# Patient Record
Sex: Male | Born: 1950 | Race: White | Hispanic: No | Marital: Married | Smoking: Never smoker
Health system: Southern US, Community
[De-identification: ages and names within clinical notes are randomized; demographics above are authoritative.]

## PROBLEM LIST (undated history)

## (undated) DIAGNOSIS — M109 Gout, unspecified: Secondary | ICD-10-CM

## (undated) DIAGNOSIS — I509 Heart failure, unspecified: Secondary | ICD-10-CM

## (undated) DIAGNOSIS — I10 Essential (primary) hypertension: Secondary | ICD-10-CM

## (undated) DIAGNOSIS — I739 Peripheral vascular disease, unspecified: Secondary | ICD-10-CM

## (undated) DIAGNOSIS — I4891 Unspecified atrial fibrillation: Secondary | ICD-10-CM

## (undated) DIAGNOSIS — I359 Nonrheumatic aortic valve disorder, unspecified: Secondary | ICD-10-CM

## (undated) DIAGNOSIS — D689 Coagulation defect, unspecified: Secondary | ICD-10-CM

## (undated) HISTORY — PX: AORTIC VALVE REPLACEMENT: SHX41

## (undated) HISTORY — DX: Heart failure, unspecified: I50.9

## (undated) HISTORY — PX: PACEMAKER INSERTION: SHX728

## (undated) HISTORY — DX: Coagulation defect, unspecified: D68.9

---

## 2001-01-25 ENCOUNTER — Emergency Department (HOSPITAL_COMMUNITY): Admission: EM | Admit: 2001-01-25 | Discharge: 2001-01-25 | Payer: Self-pay | Admitting: Emergency Medicine

## 2003-03-18 ENCOUNTER — Inpatient Hospital Stay (HOSPITAL_COMMUNITY): Admission: EM | Admit: 2003-03-18 | Discharge: 2003-03-20 | Payer: Self-pay | Admitting: Emergency Medicine

## 2003-03-18 ENCOUNTER — Encounter: Payer: Self-pay | Admitting: Emergency Medicine

## 2003-03-30 ENCOUNTER — Ambulatory Visit (HOSPITAL_COMMUNITY): Admission: RE | Admit: 2003-03-30 | Discharge: 2003-04-02 | Payer: Self-pay | Admitting: Cardiology

## 2003-04-01 ENCOUNTER — Encounter: Payer: Self-pay | Admitting: Internal Medicine

## 2004-09-21 ENCOUNTER — Ambulatory Visit: Payer: Self-pay | Admitting: *Deleted

## 2004-10-05 ENCOUNTER — Ambulatory Visit: Payer: Self-pay | Admitting: Internal Medicine

## 2004-10-16 ENCOUNTER — Ambulatory Visit: Payer: Self-pay

## 2004-11-07 ENCOUNTER — Ambulatory Visit: Payer: Self-pay | Admitting: Internal Medicine

## 2004-11-28 ENCOUNTER — Ambulatory Visit: Payer: Self-pay | Admitting: Internal Medicine

## 2004-11-29 ENCOUNTER — Inpatient Hospital Stay (HOSPITAL_COMMUNITY): Admission: RE | Admit: 2004-11-29 | Discharge: 2004-11-30 | Payer: Self-pay | Admitting: Internal Medicine

## 2004-12-01 ENCOUNTER — Ambulatory Visit: Payer: Self-pay | Admitting: Internal Medicine

## 2004-12-01 ENCOUNTER — Inpatient Hospital Stay (HOSPITAL_COMMUNITY): Admission: EM | Admit: 2004-12-01 | Discharge: 2004-12-06 | Payer: Self-pay | Admitting: Emergency Medicine

## 2004-12-11 ENCOUNTER — Ambulatory Visit: Payer: Self-pay | Admitting: *Deleted

## 2004-12-14 ENCOUNTER — Ambulatory Visit: Payer: Self-pay

## 2004-12-18 ENCOUNTER — Ambulatory Visit: Payer: Self-pay | Admitting: *Deleted

## 2005-01-09 ENCOUNTER — Ambulatory Visit: Payer: Self-pay | Admitting: *Deleted

## 2005-02-26 ENCOUNTER — Ambulatory Visit: Payer: Self-pay | Admitting: Internal Medicine

## 2005-02-28 ENCOUNTER — Ambulatory Visit: Payer: Self-pay | Admitting: Internal Medicine

## 2005-03-02 ENCOUNTER — Ambulatory Visit: Payer: Self-pay | Admitting: Cardiology

## 2005-06-25 ENCOUNTER — Ambulatory Visit: Payer: Self-pay | Admitting: Internal Medicine

## 2005-06-25 ENCOUNTER — Ambulatory Visit: Payer: Self-pay

## 2005-07-13 ENCOUNTER — Ambulatory Visit (HOSPITAL_COMMUNITY): Admission: RE | Admit: 2005-07-13 | Discharge: 2005-07-13 | Payer: Self-pay | Admitting: Internal Medicine

## 2008-04-09 IMAGING — CT CT CHEST W/ CM
1 of 2 series · 16 of 33 positions shown, 20 images · IV contrast (75ML OMNI 300)
Comparison: Chest x-ray 10/19/05.

CLINICAL DATA: Dyspnea and hemoptysis.  
 CT CHEST WITH CONTRAST:
TECHNIQUE: Multidetector CT imaging of the chest was performed following the standard protocol during bolus administration of intravenous contrast. 
 Contrast:  75cc Omnipaque 300.

[Series 4: recon 3: chest w/ · axial · 0.62mm/px · z∈[-374,-88]mm · 16 of 253 slices shown, 20 images]
[im 13/253  mediastinal]
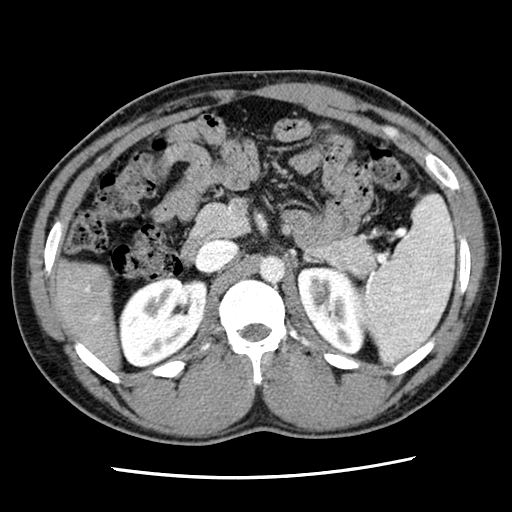
[im 13/253  lung]
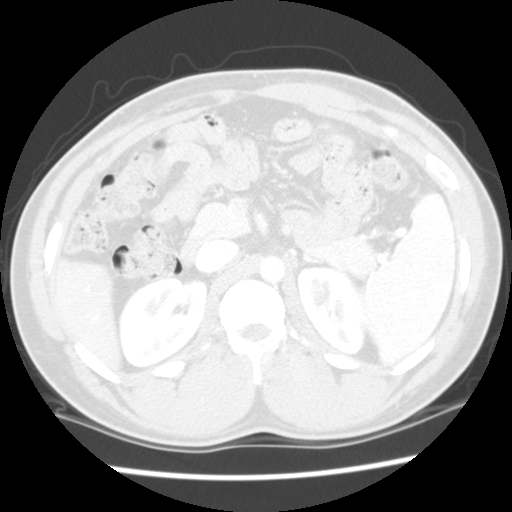
[im 37/253  lung]
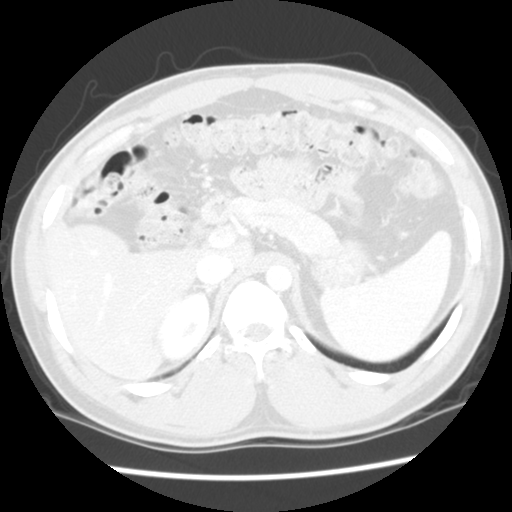
[im 49/253  lung]
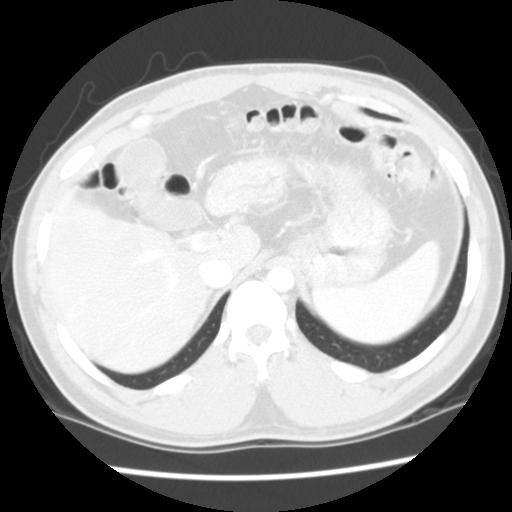
[im 64/253  lung]
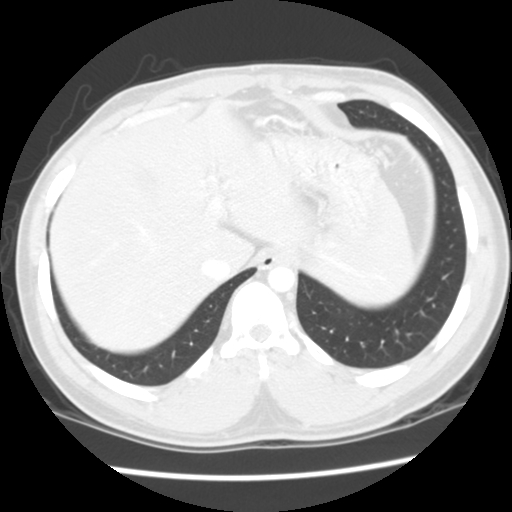
[im 73/253  mediastinal]
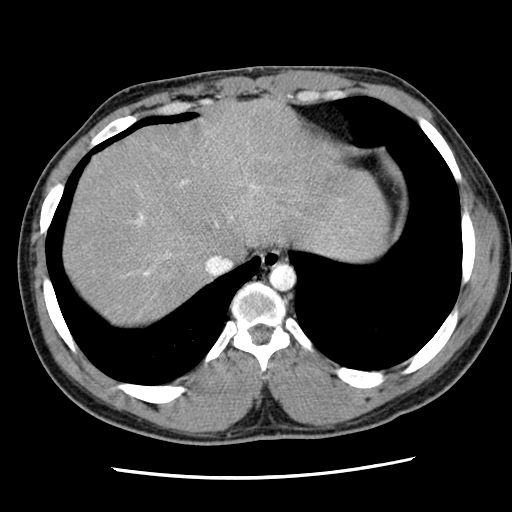
[im 73/253  lung]
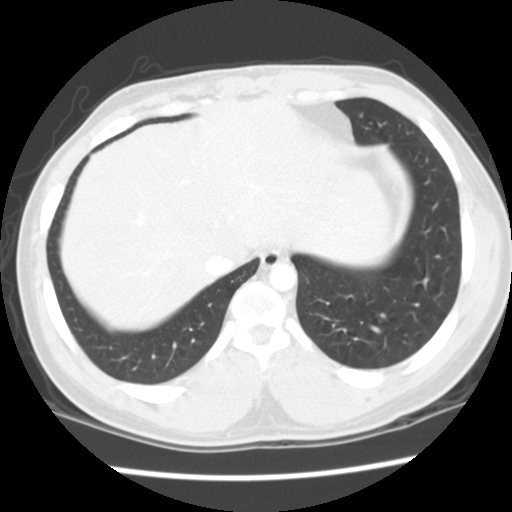
[im 97/253  lung]
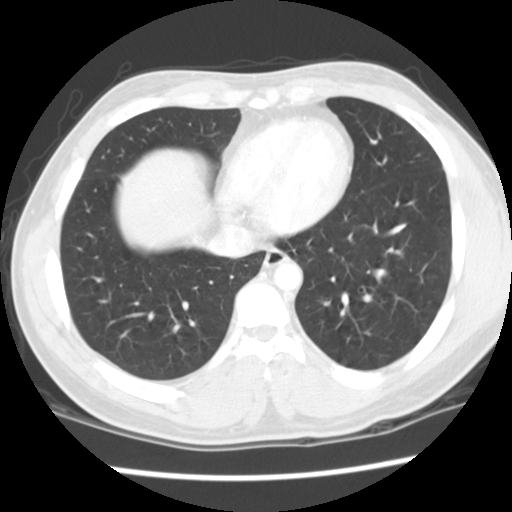
[im 109/253  lung]
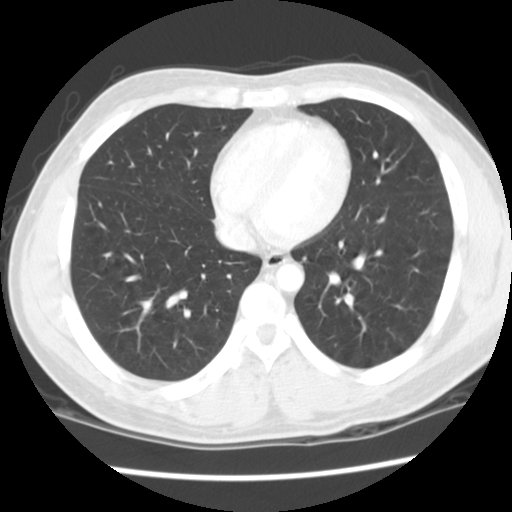
[im 121/253  lung]
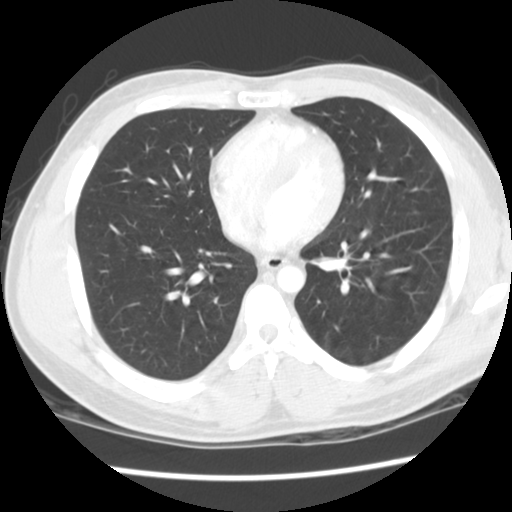
[im 127/253  mediastinal]
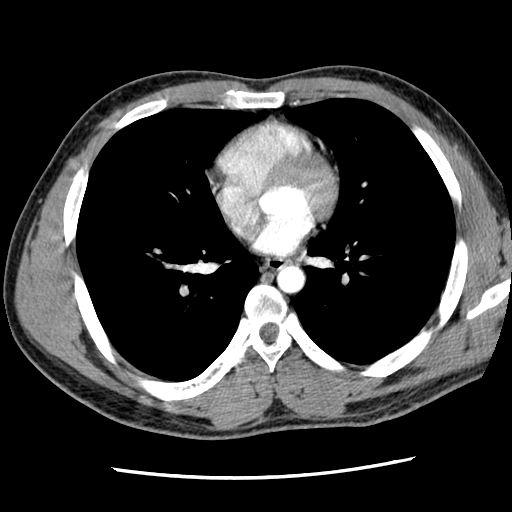
[im 127/253  lung]
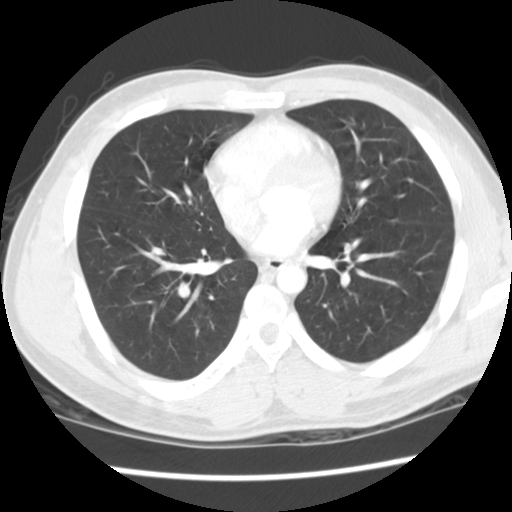
[im 145/253  lung]
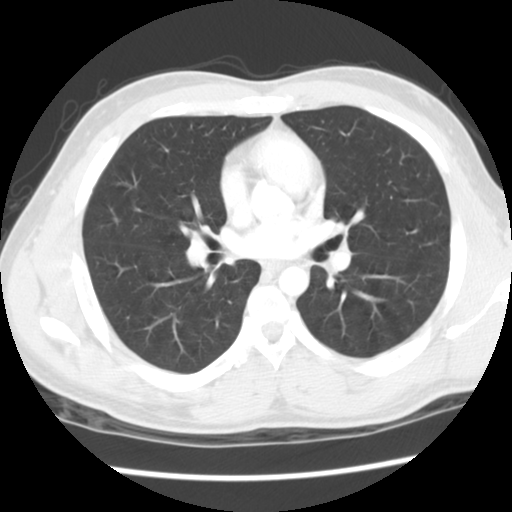
[im 157/253  lung]
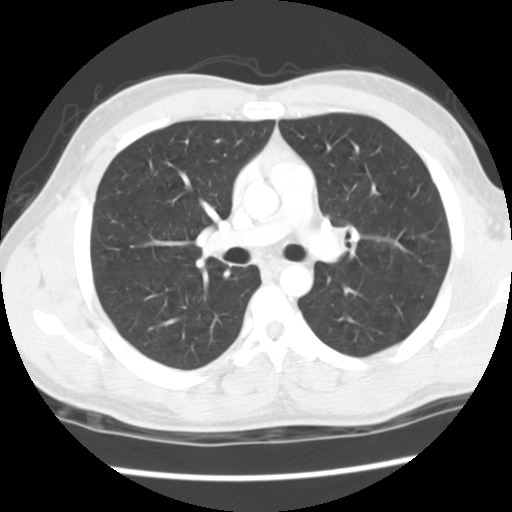
[im 181/253  lung]
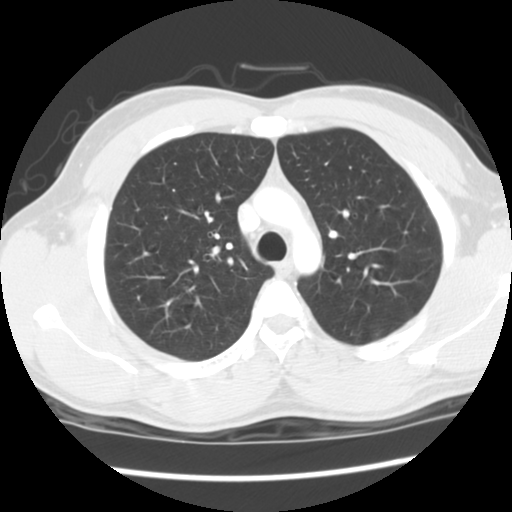
[im 190/253  mediastinal]
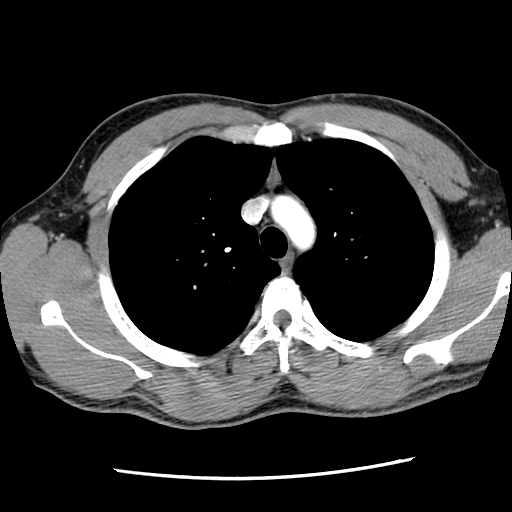
[im 190/253  lung]
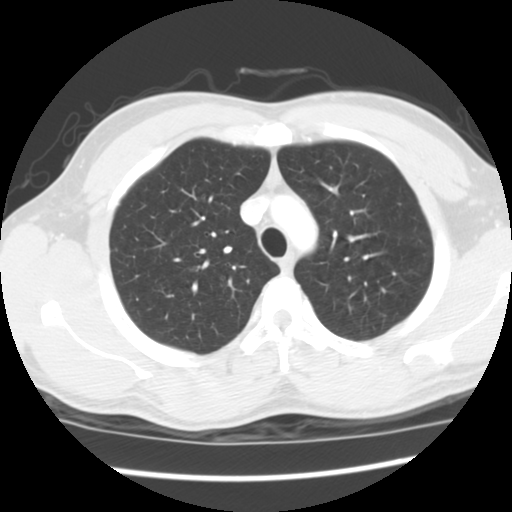
[im 205/253  lung]
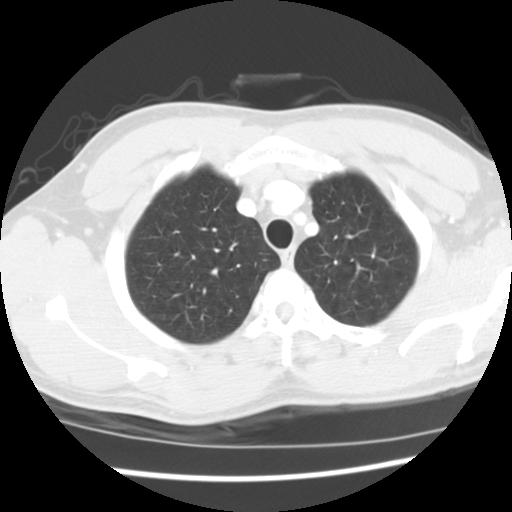
[im 217/253  lung]
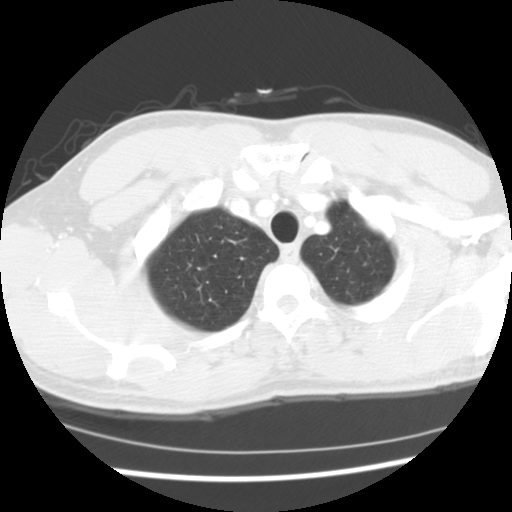
[im 241/253  lung]
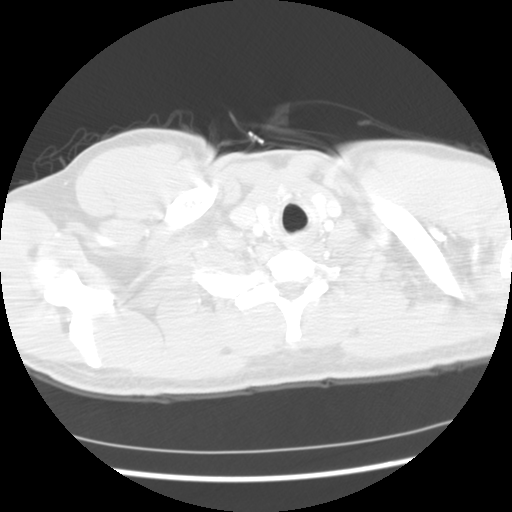

[16 of 33 positions shown; findings below may reference images not displayed]

FINDINGS: No pathologically enlarged mediastinal, hilar, or axillary lymph nodes.  Heart size normal.  No pericardial fluid.  Residual thymus is seen in the anterior mediastinum. 
 Scattered tiny pulmonary nodules are likely benign in a patient of this age.  No pleural fluid.  Airway is unremarkable.  Incidental imaging of the upper abdomen shows high attenuation material in the right kidney, likely representing early excretion of contrast.
IMPRESSION: 1.  No acute findings.  Specifically, no findings to explain the patient?s shortness of breath and hemoptysis.

## 2010-07-22 ENCOUNTER — Encounter: Payer: Self-pay | Admitting: Family Medicine

## 2010-09-14 NOTE — Letter (Signed)
Summary: Meds & Instructions/[facility]  Meds & Instructions/[facility]   Imported By: Lanelle Bal 08/04/2010 11:06:43  _____________________________________________________________________  External Attachment:    Type:   Image     Comment:   External Document

## 2011-04-27 ENCOUNTER — Telehealth: Payer: Self-pay | Admitting: *Deleted

## 2011-04-27 DIAGNOSIS — T82198A Other mechanical complication of other cardiac electronic device, initial encounter: Secondary | ICD-10-CM

## 2011-04-27 NOTE — Telephone Encounter (Signed)
Checking leads  

## 2012-08-05 ENCOUNTER — Emergency Department (HOSPITAL_COMMUNITY)
Admission: EM | Admit: 2012-08-05 | Discharge: 2012-08-05 | Disposition: A | Discharge status: Home / Self Care | Payer: BC Managed Care – PPO | Attending: Emergency Medicine | Admitting: Emergency Medicine

## 2012-08-05 ENCOUNTER — Encounter (HOSPITAL_COMMUNITY): Payer: Self-pay | Admitting: Adult Health

## 2012-08-05 ENCOUNTER — Emergency Department (HOSPITAL_COMMUNITY): Payer: BC Managed Care – PPO

## 2012-08-05 DIAGNOSIS — Z7901 Long term (current) use of anticoagulants: Secondary | ICD-10-CM | POA: Insufficient documentation

## 2012-08-05 DIAGNOSIS — Z4502 Encounter for adjustment and management of automatic implantable cardiac defibrillator: Secondary | ICD-10-CM

## 2012-08-05 DIAGNOSIS — Z711 Person with feared health complaint in whom no diagnosis is made: Secondary | ICD-10-CM | POA: Insufficient documentation

## 2012-08-05 DIAGNOSIS — M109 Gout, unspecified: Secondary | ICD-10-CM | POA: Insufficient documentation

## 2012-08-05 DIAGNOSIS — Z79899 Other long term (current) drug therapy: Secondary | ICD-10-CM | POA: Insufficient documentation

## 2012-08-05 DIAGNOSIS — Z952 Presence of prosthetic heart valve: Secondary | ICD-10-CM | POA: Insufficient documentation

## 2012-08-05 DIAGNOSIS — I499 Cardiac arrhythmia, unspecified: Secondary | ICD-10-CM | POA: Insufficient documentation

## 2012-08-05 DIAGNOSIS — Z95810 Presence of automatic (implantable) cardiac defibrillator: Secondary | ICD-10-CM | POA: Insufficient documentation

## 2012-08-05 DIAGNOSIS — Z8679 Personal history of other diseases of the circulatory system: Secondary | ICD-10-CM | POA: Insufficient documentation

## 2012-08-05 HISTORY — DX: Unspecified atrial fibrillation: I48.91

## 2012-08-05 HISTORY — DX: Nonrheumatic aortic valve disorder, unspecified: I35.9

## 2012-08-05 HISTORY — DX: Gout, unspecified: M10.9

## 2012-08-05 LAB — BASIC METABOLIC PANEL
CO2: 26 mEq/L (ref 19–32)
Calcium: 9.4 mg/dL (ref 8.4–10.5)
GFR calc non Af Amer: >90 mL/min (ref >90)

## 2012-08-05 LAB — POCT I-STAT TROPONIN I: Troponin i, poc: 0.01 ng/mL (ref 0.00–0.08)

## 2012-08-05 LAB — CBC
HCT: 38.4 % — ABNORMAL LOW (ref 39.0–52.0)
Hemoglobin: 13.5 g/dL (ref 13.0–17.0)
MCH: 32.1 pg (ref 26.0–34.0)
MCHC: 35.2 g/dL (ref 30.0–36.0)
MCV: 91.4 fL (ref 78.0–100.0)
RDW: 12.2 % (ref 11.5–15.5)
WBC: 9.8 10*3/uL (ref 4.0–10.5)

## 2012-08-05 MED ORDER — METOPROLOL TARTRATE 25 MG PO TABS: 75.0000 mg | ORAL_TABLET | Freq: Two times a day (BID) | ORAL | Status: DC

## 2012-08-05 MED ORDER — METOPROLOL TARTRATE 25 MG PO TABS
75.0000 mg | ORAL_TABLET | Freq: Once | ORAL | Status: AC
  Administered 2012-08-05: 75 mg via ORAL
  Filled 2012-08-05: qty 3

## 2012-08-05 NOTE — ED Notes (Signed)
While eating steak dinner tonight defibrillator fired twice. New defribillatr just placed on 12/13. [employer] Jude defibrillator. Hx of Afib. Became lightheaded before defibrilator fired. He is alert and oriented, MAE x4. Denies pain, denies SOB. Pre existing LBB, pacemaker/defibrillator dual chamber pacemaker, demand pacemaker at 60%.

## 2012-08-05 NOTE — ED Provider Notes (Signed)
History     CSN: [id number]  Arrival date & time 08/05/12  [place]   First MD Initiated Contact with Patient 08/05/12 2007      Chief Complaint  Patient presents with  . Pacemaker Problem    (Consider location/radiation/quality/duration/timing/severity/associated sxs/prior treatment) HPI Comments: 61 year old male with history of recent pacemaker change at [facility].  This was one week ago.  Tonight he went into the restaurant to have dinner.  He felt light-headed and dizzy and became near-syncopal.  He was then shocked and instantly felt better.  He denies any other symptoms.  No aggravating or alleviating factors.    The history is provided by the patient.    Past Medical History  Diagnosis Date  . Atrial fibrillation   . Aortic valve defect   . Gout     Past Surgical History  Procedure Date  . Aortic valve replacement   . Pacemaker insertion     History reviewed. No pertinent family history.  History  Substance Use Topics  . Smoking status: Not on file  . Smokeless tobacco: Not on file  . Alcohol Use:       Review of Systems  All other systems reviewed and are negative.    Allergies  Ivp dye  Home Medications   Current Outpatient Rx  Name  Route  Sig  Dispense  Refill  . METOPROLOL TARTRATE 25 MG PO TABS   Oral   Take 50 mg by mouth 2 (two) times daily.         Marland Kitchen PRESCRIPTION MEDICATION   Oral   Take 1 tablet by mouth daily. Allopurinol         . RAMIPRIL 2.5 MG PO CAPS   Oral   Take 2.5 mg by mouth daily.         . WARFARIN SODIUM 7.5 MG PO TABS   Oral   Take 7.5 mg by mouth daily.           BP 143/73  Pulse 75  Temp 99 F (37.2 C) (Oral)  Resp 16  SpO2 97%  Physical Exam  Nursing note reviewed. Constitutional: He is oriented to person, place, and time. He appears well-developed and well-nourished. No distress.  HENT:  Head: Normocephalic and atraumatic.  Mouth/Throat: Oropharynx is clear and moist.  Eyes: EOM are normal.  Pupils are equal, round, and reactive to light.  Neck: Normal range of motion. Neck supple.  Cardiovascular: Normal rate and regular rhythm.   No murmur heard. Pulmonary/Chest: Effort normal and breath sounds normal. No respiratory distress.  Abdominal: Soft. Bowel sounds are normal.  Musculoskeletal: Normal range of motion. He exhibits no edema.  Neurological: He is alert and oriented to person, place, and time. No cranial nerve deficit. Coordination normal.  Skin: Skin is warm and dry. He is not diaphoretic.    ED Course  Procedures (including critical care time)  Labs Reviewed  CBC - Abnormal; Notable for the following:    RBC 4.20 (*)     HCT 38.4 (*)     All other components within normal limits  BASIC METABOLIC PANEL - Abnormal; Notable for the following:    Glucose, Bld 115 (*)     All other components within normal limits  POCT I-STAT TROPONIN I   No results found.   No diagnosis found.   Date: 08/05/2012  Rate: 72  Rhythm: normal sinus rhythm  QRS Axis: normal  Intervals: normal  ST/T Wave abnormalities: nonspecific T wave  changes  Conduction Disutrbances:left bundle branch block  Narrative Interpretation:   Old EKG Reviewed: none available    MDM  The patient presents after being shocked by his defibrillator.  He feels fine now and labs look okay.  The device was interrogated and revealed that this was an appropriate shock.  I have contacted the cardiologist at [facility] where he had his device placed.  I spoke with Dr. Misty Stanley who did not feel as thought the patient required transfer.  His recommendation was to increase the dose of lopressor to 75 mg twice daily and follow up with Dr. Orson Aloe on the 26th.  I discussed this with the patient who was agreeable to this plan.          Geoffery Lyons, MD 08/06/12 Marlyne Beards

## 2013-05-27 IMAGING — CR DG CHEST 1V PORT
1 series · 1 of 1 positions shown · non-contrast
Comparison: 11/30/2004.

CLINICAL DATA: Pacemaker problem.  Syncope.

PORTABLE CHEST - 1 VIEW

[AP]
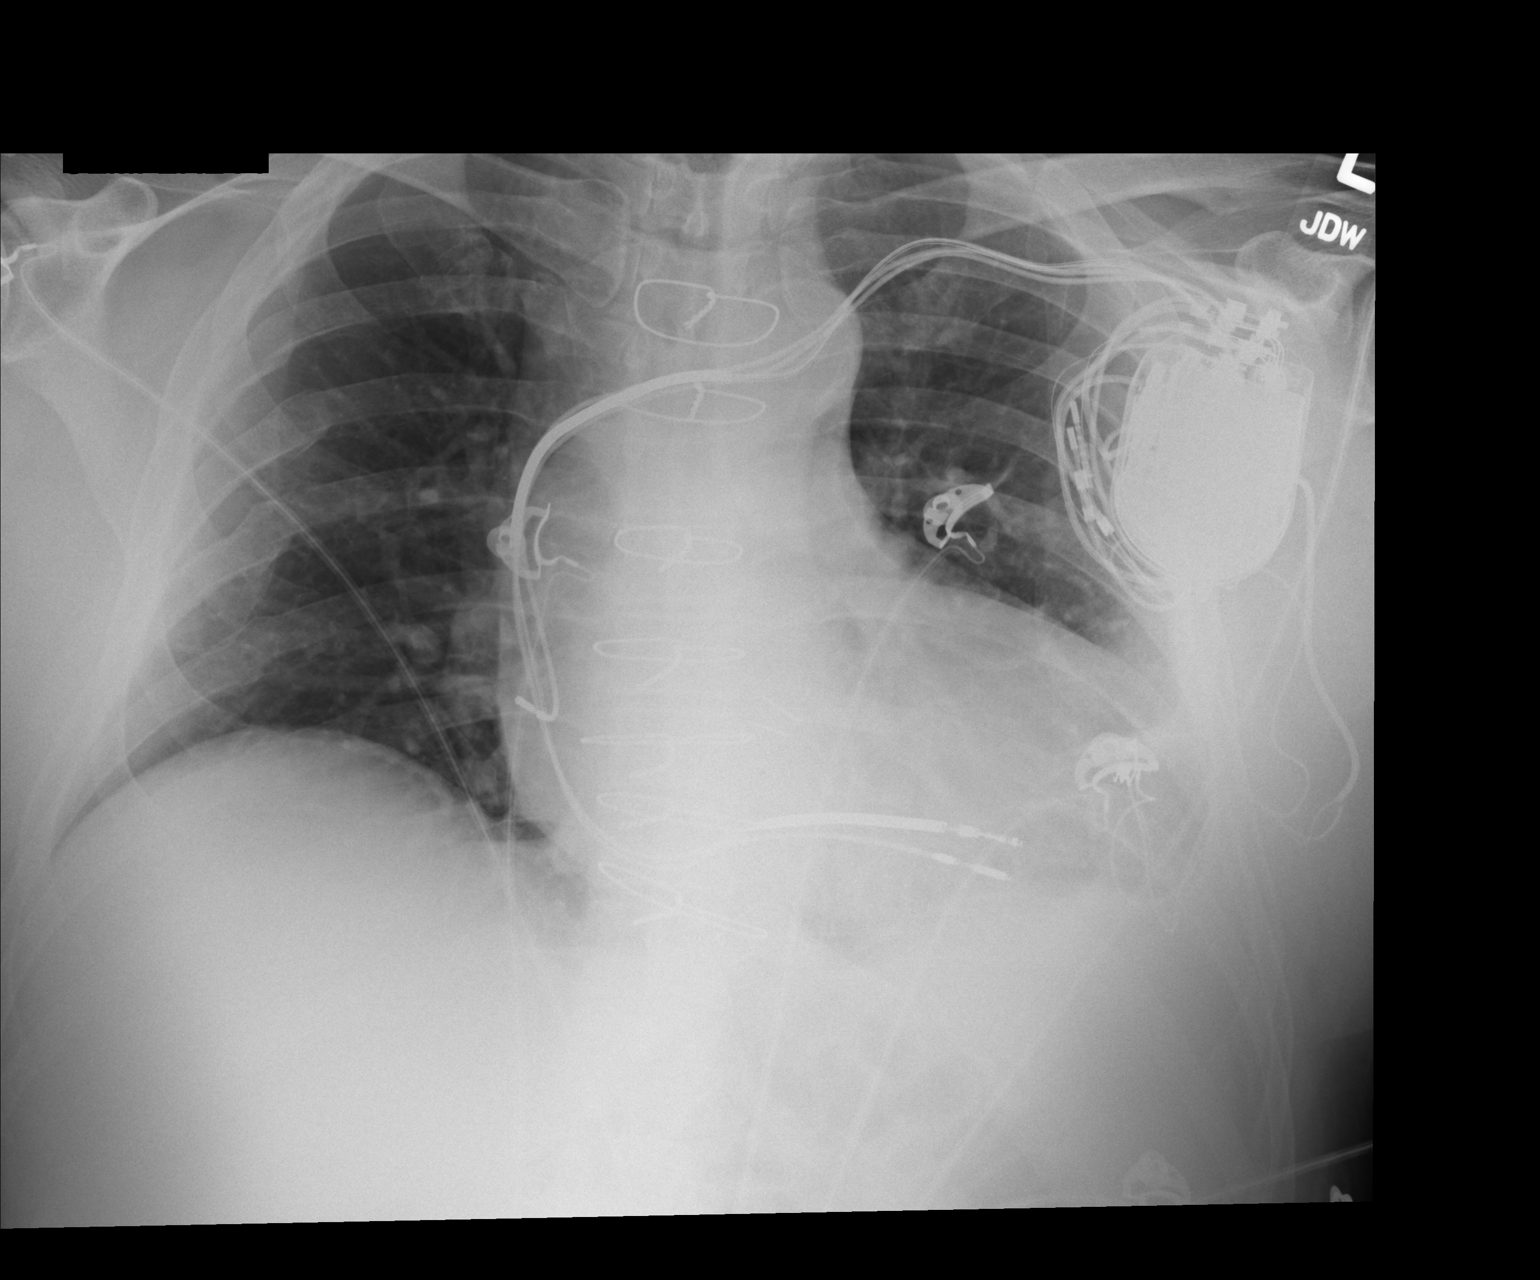

[1 of 1 positions shown; findings below may reference images not displayed]

FINDINGS: Interval revision of the left subclavian approach cardiac
pacemaker power pack.  AICD leads appears similarly positioned
compared to prior.  Redundant leads are present in the pacemaker
pocket.  Wires lateral to the pocket and the inferior may be
redundant or be external to the patient.

Low volume chest.  Small left pleural effusion.  Left basilar
atelectasis.

Healed right rib fractures incidentally noted.
IMPRESSION: 1.  Interval revision of a left subclavian pacemaker power pack.
Leads as described above.
2.  Low volume chest.  Left basilar atelectasis and small left
pleural effusion.

## 2015-09-28 ENCOUNTER — Ambulatory Visit: Payer: Self-pay | Admitting: Family

## 2015-12-20 ENCOUNTER — Encounter (HOSPITAL_COMMUNITY): Payer: Self-pay | Admitting: Emergency Medicine

## 2015-12-20 ENCOUNTER — Ambulatory Visit (HOSPITAL_COMMUNITY)
Admission: EM | Admit: 2015-12-20 | Discharge: 2015-12-20 | Disposition: A | Discharge status: Home / Self Care | Payer: Managed Care, Other (non HMO) | Attending: Emergency Medicine | Admitting: Emergency Medicine

## 2015-12-20 DIAGNOSIS — M10071 Idiopathic gout, right ankle and foot: Secondary | ICD-10-CM | POA: Diagnosis not present

## 2015-12-20 MED ORDER — ALLOPURINOL 100 MG PO TABS: 100.0000 mg | ORAL_TABLET | Freq: Every day | ORAL | Status: DC

## 2015-12-20 MED ORDER — PREDNISONE 20 MG PO TABS: ORAL_TABLET | ORAL | Status: DC

## 2015-12-20 NOTE — ED Notes (Signed)
Patient c/o swelling in his right ankle x 1 month. Patient reports he has been taking ibuprofen. This is a recurrent problem.  He does not have a primary care doctor. Patient is limping but can bear weight on it.

## 2015-12-20 NOTE — ED Provider Notes (Signed)
CSN: [id number]     Arrival date & time 12/20/15  1311 History   First MD Initiated Contact with Patient 12/20/15 1340     Chief Complaint  Patient presents with  . Gout    HPI Pt is a 65 y.o. male with history of gout who presents for ankle pain and swelling for about 1 month. Pt reports that last month on vacation his right great toe became very swollen, red and painful but this resolved after a few days. After this his ankle started to swell up and become painful as well. He can walk on it but it hurts a lot, this morning it was worse so he came in. He tried using a boot that was given to him by ortho in the past and this helped some. He denies any trauma, fevers.   Past Medical History  Diagnosis Date  . Atrial fibrillation ([place])   . Aortic valve defect   . Gout    Past Surgical History  Procedure Laterality Date  . Aortic valve replacement    . Pacemaker insertion     No family history on file. Social History  Substance Use Topics  . Smoking status: Never Smoker   . Smokeless tobacco: None  . Alcohol Use: No    Review of Systems See HPI  Allergies  Ivp dye  Home Medications   Prior to Admission medications   Medication Sig Start Date End Date Taking? Authorizing Provider  allopurinol (ZYLOPRIM) 100 MG tablet Take 1 tablet (100 mg total) by mouth daily. 12/20/15   Frazier Richards, MD  metoprolol tartrate (LOPRESSOR) 25 MG tablet Take 50 mg by mouth 2 (two) times daily.    Historical Provider, MD  metoprolol tartrate (LOPRESSOR) 25 MG tablet Take 3 tablets (75 mg total) by mouth 2 (two) times daily. 08/05/12   Veryl Speak, MD  predniSONE (DELTASONE) 20 MG tablet Take 60mg  x 2 days, 40mg  x 2 days, 20mg  x 3 days 12/20/15   Frazier Richards, MD  PRESCRIPTION MEDICATION Take 1 tablet by mouth daily. Allopurinol    Historical Provider, MD  ramipril (ALTACE) 2.5 MG capsule Take 2.5 mg by mouth daily.    Historical Provider, MD  warfarin (COUMADIN) 7.5 MG tablet Take 7.5 mg by mouth  daily.    Historical Provider, MD   Meds Ordered and Administered this Visit  Medications - No data to display  BP 147/72 mmHg  Pulse 61  Temp(Src) 98.3 F (36.8 C) (Oral)  Resp 14  SpO2 99% No data found.   Physical Exam  Constitutional: He is oriented to person, place, and time. He appears well-developed and well-nourished. No distress.  HENT:  Head: Normocephalic and atraumatic.  Right Ear: External ear normal.  Left Ear: External ear normal.  Nose: Nose normal.  Mouth/Throat: Oropharynx is clear and moist.  Eyes: Conjunctivae are normal. Pupils are equal, round, and reactive to light. Right eye exhibits no discharge. Left eye exhibits no discharge.  Neck: Normal range of motion. Neck supple.  Cardiovascular: Normal rate, regular rhythm, normal heart sounds and intact distal pulses.   No murmur heard. Pulmonary/Chest: Effort normal and breath sounds normal. No respiratory distress. He has no wheezes.  Abdominal: Soft. Bowel sounds are normal. He exhibits no distension. There is no tenderness.  Musculoskeletal:       Right ankle: He exhibits swelling. He exhibits normal range of motion, no ecchymosis, no deformity, no laceration and normal pulse. Tenderness. No lateral malleolus, no medial  malleolus and no head of 5th metatarsal tenderness found. Achilles tendon normal.       Feet:  Neurological: He is alert and oriented to person, place, and time.  Skin: Skin is warm and dry. He is not diaphoretic.  Psychiatric: He has a normal mood and affect. His behavior is normal.  Nursing note and vitals reviewed.   ED Course  Procedures (including critical care time)  Labs Review Labs Reviewed - No data to display  Imaging Review No results found.     MDM   1. Acute idiopathic gout of right ankle    Recurrent joint pain and swelling. Likely gout, has seen ortho in the past and they didn't offer him much help. He is not convinced that it is gout so I have recommended he  see Dr. Paulla Dolly at [place] for another opinion. I have prescribed prednisone for this acute attack as he is already taking ibuprofen and it hasn't helped very much.    Frazier Richards, MD 12/20/15 1425

## 2015-12-20 NOTE — Discharge Instructions (Signed)

## 2017-01-22 DIAGNOSIS — Z95810 Presence of automatic (implantable) cardiac defibrillator: Secondary | ICD-10-CM | POA: Diagnosis not present

## 2017-02-19 ENCOUNTER — Encounter (HOSPITAL_COMMUNITY): Payer: Self-pay | Admitting: *Deleted

## 2017-02-19 ENCOUNTER — Ambulatory Visit (HOSPITAL_COMMUNITY)
Admission: EM | Admit: 2017-02-19 | Discharge: 2017-02-19 | Disposition: A | Discharge status: Home / Self Care | Payer: Medicare Other | Attending: Family Medicine | Admitting: Family Medicine

## 2017-02-19 DIAGNOSIS — B957 Other staphylococcus as the cause of diseases classified elsewhere: Secondary | ICD-10-CM | POA: Insufficient documentation

## 2017-02-19 DIAGNOSIS — L03116 Cellulitis of left lower limb: Secondary | ICD-10-CM | POA: Diagnosis not present

## 2017-02-19 DIAGNOSIS — Z1629 Resistance to other single specified antibiotic: Secondary | ICD-10-CM | POA: Diagnosis not present

## 2017-02-19 HISTORY — DX: Essential (primary) hypertension: I10

## 2017-02-19 MED ORDER — AMOXICILLIN-POT CLAVULANATE 875-125 MG PO TABS: 1.0000 | ORAL_TABLET | Freq: Two times a day (BID) | ORAL | 0 refills | Status: DC

## 2017-02-19 NOTE — ED Provider Notes (Signed)
CSN: [id number]     Arrival date & time 02/19/17  1451 History   First MD Initiated Contact with Patient 02/19/17 1531     Chief Complaint  Patient presents with  . Laceration   (Consider location/radiation/quality/duration/timing/severity/associated sxs/prior Treatment) 66 year old male presents to clinic for evaluation to a laceration occurring on his lower left leg. He was working in his garden one month ago, when he lifted a tiller, in the rear tine, scraped his leg. The tiller was not running at the time. It is been one month since the incident, and the wound has not healed. States it is not painful, and he has not spread, he has no fever, chills, nausea, or systemic symptoms. Denies history of diabetes, or peripheral vascular disease. He is on warfarin for mechanical heart valve, and has history of atrial fibrillation swell. Otherwise he reports to be in good health.    Laceration  Associated symptoms: no fever     Past Medical History:  Diagnosis Date  . Aortic valve defect   . Atrial fibrillation ([place])   . Gout   . Hypertension    Past Surgical History:  Procedure Laterality Date  . AORTIC VALVE REPLACEMENT    . PACEMAKER INSERTION     History reviewed. No pertinent family history. Social History  Substance Use Topics  . Smoking status: Never Smoker  . Smokeless tobacco: Never Used  . Alcohol use Yes    Review of Systems  Constitutional: Negative for appetite change, chills, fatigue and fever.  HENT: Negative.   Respiratory: Negative.   Cardiovascular: Negative.   Gastrointestinal: Negative.   Musculoskeletal: Negative.   Skin: Positive for color change and wound.  Neurological: Negative.     Allergies  Ivp dye [iodinated diagnostic agents]  Home Medications   Prior to Admission medications   Medication Sig Start Date End Date Taking? Authorizing Provider  allopurinol (ZYLOPRIM) 100 MG tablet Take 1 tablet (100 mg total) by mouth daily. 12/20/15   Frazier Richards, MD  amoxicillin-clavulanate (AUGMENTIN) 875-125 MG tablet Take 1 tablet by mouth 2 (two) times daily. 02/19/17   Barnet Glasgow, NP  metoprolol tartrate (LOPRESSOR) 25 MG tablet Take 50 mg by mouth 2 (two) times daily.    [provider]  metoprolol tartrate (LOPRESSOR) 25 MG tablet Take 3 tablets (75 mg total) by mouth 2 (two) times daily. 08/05/12   Veryl Speak, MD  predniSONE (DELTASONE) 20 MG tablet Take 60mg  x 2 days, 40mg  x 2 days, 20mg  x 3 days 12/20/15   Frazier Richards, MD  PRESCRIPTION MEDICATION Take 1 tablet by mouth daily. Allopurinol    [provider]  ramipril (ALTACE) 2.5 MG capsule Take 2.5 mg by mouth daily.    [provider]  warfarin (COUMADIN) 7.5 MG tablet Take 7.5 mg by mouth daily.    [provider]   Meds Ordered and Administered this Visit  Medications - No data to display  BP (!) 142/84   Pulse 78   Temp 98.6 F (37 C) (Oral)   Resp 18   SpO2 100%  No data found.   Physical Exam  Constitutional: He is oriented to person, place, and time. He appears well-developed and well-nourished. No distress.  HENT:  Head: Normocephalic and atraumatic.  Right Ear: External ear normal.  Left Ear: External ear normal.  Eyes: Conjunctivae are normal.  Neck: Normal range of motion.  Cardiovascular: Normal rate and regular rhythm.   Pulmonary/Chest: Effort normal and breath sounds  normal.  Neurological: He is alert and oriented to person, place, and time.  Skin: Skin is warm and dry. Capillary refill takes less than 2 seconds. He is not diaphoretic.  Approximately 1 x 2 cm wound on the left lower leg approximately halfway between the knee and the ankle, with purulent material within the wound, and adjacent cellulitis. Pulse, motor, sensory function intact distally, capillary refill less than 2 seconds. No pitting edema distal to the wound.  Psychiatric: He has a normal mood and affect. His behavior is normal.  Nursing note  and vitals reviewed.   Urgent Care Course     Procedures (including critical care time)  Labs Review Labs Reviewed  AEROBIC CULTURE (SUPERFICIAL SPECIMEN)    Imaging Review No results found.    MDM   1. Cellulitis of left lower extremity     Wound cleaned, and dressed, cultures were obtained and sent to lab for sensitivities, starting on Augmentin, wound care guidelines on with follow-up guidelines were provided, return to clinic as necessary, or the ER at any time symptoms worsen     Barnet Glasgow, NP 02/19/17 1609

## 2017-02-19 NOTE — ED Triage Notes (Signed)
Pt   Sustained     A  Laceration        To  l  Lower     Leg    With   A    Garden tiller        about  1  Month   Ago   Will  Not  Heal      Slightly        Red  And  Puffy

## 2017-02-19 NOTE — Discharge Instructions (Signed)
Cultures of your wound have been obtained and sent to the lab for testing. I have started you on Augmentin, take 1 tablet twice a day for 7 days. If there need to be changes in your therapy based on the lab findings, you will be called and notified. You wound has been cleaned and dressed. Keep your wound clean and dry, change your bandages at least daily. If you have any fever or worsening symptoms, consider going to the ER.

## 2017-02-23 LAB — AEROBIC CULTURE W GRAM STAIN (SUPERFICIAL SPECIMEN)

## 2017-02-23 LAB — AEROBIC CULTURE  (SUPERFICIAL SPECIMEN)

## 2017-03-11 ENCOUNTER — Ambulatory Visit (HOSPITAL_COMMUNITY)
Admission: EM | Admit: 2017-03-11 | Discharge: 2017-03-11 | Disposition: A | Discharge status: Home / Self Care | Payer: Medicare Other | Attending: Family Medicine | Admitting: Family Medicine

## 2017-03-11 ENCOUNTER — Encounter (HOSPITAL_COMMUNITY): Payer: Self-pay | Admitting: Emergency Medicine

## 2017-03-11 DIAGNOSIS — L03116 Cellulitis of left lower limb: Secondary | ICD-10-CM | POA: Diagnosis not present

## 2017-03-11 DIAGNOSIS — Z5189 Encounter for other specified aftercare: Secondary | ICD-10-CM

## 2017-03-11 MED ORDER — MUPIROCIN 2 % EX OINT: 1.0000 "application " | TOPICAL_OINTMENT | Freq: Two times a day (BID) | CUTANEOUS | 0 refills | Status: DC

## 2017-03-11 NOTE — ED Triage Notes (Signed)
Patient has an appointment with the wound clinic one week from today.  Wound is on the left lower leg.  Patient reports redness is improved.  Patient finished antibiotic one week ago.  Concerned for this week without antibiotics and waiting for appt next week.

## 2017-03-11 NOTE — Discharge Instructions (Signed)
No signs of cellulitis today. Apply Bactroban ointment twice a day on affected side. Monitor for worsening of symptoms such as fever, surrounding erythema, increased warmth, pain, follow-up for further evaluation. Follow up with wound clinic as scheduled.

## 2017-03-11 NOTE — ED Provider Notes (Signed)
CSN: [id number]     Arrival date & time 03/11/17  1243 History   None    Chief Complaint  Patient presents with  . Wound Check   (Consider location/radiation/quality/duration/timing/severity/associated sxs/prior Treatment) 66 year old male with history of aortic valve defect, A. fib, hypertension, gout comes in for wound recheck. He was put on Augmentin for cellulitis, has finished course of treatment. He has a wound clinic appointment in a week, was an sure whether or not he needed more antibiotics until then. States wound is doing better, surrounding erythema and warmth have resolved. He continues to put Neosporin ointment on the wound with dressing. Denies fever, chills, night sweats. Denies worsening of pain. Does report some numbness, tingling of his foot, it is intermittent and improves with elevation of leg. Denies history of diabetes, venous/arterial insufficiency.       Past Medical History:  Diagnosis Date  . Aortic valve defect   . Atrial fibrillation ([place])   . Gout   . Hypertension    Past Surgical History:  Procedure Laterality Date  . AORTIC VALVE REPLACEMENT    . PACEMAKER INSERTION     No family history on file. Social History  Substance Use Topics  . Smoking status: Never Smoker  . Smokeless tobacco: Never Used  . Alcohol use Yes    Review of Systems  Reason unable to perform ROS: See HPI as above.    Allergies  Ivp dye [iodinated diagnostic agents]  Home Medications   Prior to Admission medications   Medication Sig Start Date End Date Taking? Authorizing Provider  OVER THE COUNTER MEDICATION Antiarrythmic drug unable to remember name   Yes [provider]  allopurinol (ZYLOPRIM) 100 MG tablet Take 1 tablet (100 mg total) by mouth daily. 12/20/15   Frazier Richards, MD  metoprolol tartrate (LOPRESSOR) 25 MG tablet Take 50 mg by mouth 2 (two) times daily.    [provider]  metoprolol tartrate (LOPRESSOR) 25 MG tablet Take 3 tablets (75  mg total) by mouth 2 (two) times daily. 08/05/12   Veryl Speak, MD  mupirocin ointment (BACTROBAN) 2 % Apply 1 application topically 2 (two) times daily. 03/11/17   Tasia Catchings, Virga Haltiwanger V, PA-C  PRESCRIPTION MEDICATION Take 1 tablet by mouth daily. Allopurinol    [provider]  ramipril (ALTACE) 2.5 MG capsule Take 2.5 mg by mouth daily.    [provider]  warfarin (COUMADIN) 7.5 MG tablet Take 7.5 mg by mouth daily.    [provider]   Meds Ordered and Administered this Visit  Medications - No data to display  BP (!) 146/77 (BP Location: Left Arm)   Pulse 62   Temp 98.1 F (36.7 C) (Oral)   Resp 20   SpO2 100%  No data found.   Physical Exam  Constitutional: He is oriented to person, place, and time. He appears well-developed and well-nourished. No distress.  HENT:  Head: Normocephalic and atraumatic.  Neurological: He is alert and oriented to person, place, and time.  Skin:  1cm x2cm wound on left shin. Some surrounding erythema, no increased warmth. No edema of the foot. Cap refill < 2s. Pedal pulses 2+  Psychiatric: He has a normal mood and affect. His behavior is normal. Judgment normal.    Urgent Care Course     Procedures (including critical care time)  Labs Review Labs Reviewed - No data to display  Imaging Review No results found.      MDM   1. Visit  for wound check    Discussed with patient no signs of cellulitis today. Continue wound care, Bactroban ointment called in today. Patient to follow up with wound clinic as scheduled. Discussed with patient possible causes of numbness and tingling of the lower extremity, including swelling, venous insufficiency, arterial insufficiency. Given history of A. fib and valve defect, patient to follow-up with PCP for further evaluation and imaging needed.   Ok Edwards, PA-C 03/11/17 1413

## 2017-03-13 ENCOUNTER — Emergency Department (HOSPITAL_COMMUNITY)
Admission: EM | Admit: 2017-03-13 | Discharge: 2017-03-13 | Disposition: A | Discharge status: Home / Self Care | Payer: Medicare Other | Attending: Emergency Medicine | Admitting: Emergency Medicine

## 2017-03-13 ENCOUNTER — Encounter (HOSPITAL_COMMUNITY): Payer: Self-pay | Admitting: *Deleted

## 2017-03-13 DIAGNOSIS — R202 Paresthesia of skin: Secondary | ICD-10-CM | POA: Diagnosis not present

## 2017-03-13 DIAGNOSIS — Z79899 Other long term (current) drug therapy: Secondary | ICD-10-CM | POA: Diagnosis not present

## 2017-03-13 DIAGNOSIS — M7989 Other specified soft tissue disorders: Secondary | ICD-10-CM | POA: Diagnosis not present

## 2017-03-13 DIAGNOSIS — Z7901 Long term (current) use of anticoagulants: Secondary | ICD-10-CM | POA: Diagnosis not present

## 2017-03-13 DIAGNOSIS — M79672 Pain in left foot: Secondary | ICD-10-CM | POA: Diagnosis not present

## 2017-03-13 DIAGNOSIS — R200 Anesthesia of skin: Secondary | ICD-10-CM | POA: Diagnosis present

## 2017-03-13 LAB — COMPREHENSIVE METABOLIC PANEL
ALK PHOS: 67 U/L (ref 38–126)
ALT: 28 U/L (ref 17–63)
AST: 32 U/L (ref 15–41)
Albumin: 4.5 g/dL (ref 3.5–5.0)
Anion gap: 8 (ref 5–15)
BILIRUBIN TOTAL: 0.8 mg/dL (ref 0.3–1.2)
BUN: 12 mg/dL (ref 6–20)
CALCIUM: 9.7 mg/dL (ref 8.9–10.3)
CO2: 25 mmol/L (ref 22–32)
CREATININE: 0.8 mg/dL (ref 0.61–1.24)
Chloride: 104 mmol/L (ref 101–111)
GFR calc Af Amer: >60 mL/min (ref >60)
Glucose, Bld: 87 mg/dL (ref 65–99)
POTASSIUM: 4 mmol/L (ref 3.5–5.1)
Sodium: 137 mmol/L (ref 135–145)
TOTAL PROTEIN: 8.2 g/dL — AB (ref 6.5–8.1)

## 2017-03-13 LAB — CBC WITH DIFFERENTIAL/PLATELET
BASOS ABS: 0.1 10*3/uL (ref 0.0–0.1)
BASOS PCT: 1 %
EOS ABS: 0.1 10*3/uL (ref 0.0–0.7)
EOS PCT: 2 %
HCT: 42.8 % (ref 39.0–52.0)
Hemoglobin: 15.3 g/dL (ref 13.0–17.0)
Lymphocytes Relative: 22 %
Lymphs Abs: 1.9 10*3/uL (ref 0.7–4.0)
MCH: 32.8 pg (ref 26.0–34.0)
MCHC: 35.7 g/dL (ref 30.0–36.0)
MCV: 91.8 fL (ref 78.0–100.0)
MONO ABS: 0.7 10*3/uL (ref 0.1–1.0)
Monocytes Relative: 9 %
Neutro Abs: 5.6 10*3/uL (ref 1.7–7.7)
Neutrophils Relative %: 66 %
PLATELETS: 186 10*3/uL (ref 150–400)
RBC: 4.66 MIL/uL (ref 4.22–5.81)
RDW: 12.6 % (ref 11.5–15.5)
WBC: 8.4 10*3/uL (ref 4.0–10.5)

## 2017-03-13 LAB — PROTIME-INR
INR: 0.99
PROTHROMBIN TIME: 13.1 s (ref 11.4–15.2)

## 2017-03-13 LAB — APTT: APTT: 27 s (ref 24–36)

## 2017-03-13 MED ORDER — WARFARIN SODIUM 7.5 MG PO TABS: 7.5000 mg | ORAL_TABLET | Freq: Every day | ORAL | 0 refills | Status: DC

## 2017-03-13 MED ORDER — WARFARIN SODIUM 7.5 MG PO TABS
7.5000 mg | ORAL_TABLET | Freq: Once | ORAL | Status: AC
  Administered 2017-03-13: 7.5 mg via ORAL
  Filled 2017-03-13: qty 1

## 2017-03-13 NOTE — Discharge Instructions (Signed)
Please follow up for the venous flow study tomorrow morning. Please follow the attached instructions. Please also follow-up with the vascular surgeon as soon as possible. Call the number provided to set up an appointment. They will continue with any further assessment needed. Please return to the ED immediately should any symptoms worsen including significant increase in pain or discomfort, consistent numbness, spreading coldness, or any other serious concerns.  You should also follow up with your cardiologist to have your INR checked tomorrow to make sure your level of anticoagulation is sufficient.

## 2017-03-13 NOTE — ED Triage Notes (Signed)
Pt reports recent infection to left lower leg and has been getting treated for it at clinic. Pt has now developed numbness and tingling sensation to bilateral feet, more severe in left foot. Pt thinks its possible neuropathy. Pain started approx 3 weeks ago and pt unable to sleep. No acute distress is noted at triage.

## 2017-03-13 NOTE — ED Provider Notes (Signed)
[facility] DEPT Provider Note   CSN: [id number] Arrival date & time: 03/13/17  1436     History   Chief Complaint Chief Complaint  Patient presents with  . Numbness  . Tingling  . Foot Pain    HPI Edward Marquez is a 65 y.o. male.  HPI   Edward Marquez is a 66 y.o. male, with a history of A. fib, ablation, HTN, bioprosthetic aortic valve replacement, and gout, presenting to the ED with Lower extremity discomfort for about the past month. Patient states he sustained a laceration about a month ago from the blade of a garden tiller that was not running. Since that time he endorses a pins and needles paresthesia sensation to the anterior sole of the left foot. It has since also started in the same area on the right foot, but still worse on the left. States he delayed in getting the laceration evaluated for about 2 weeks. It was infected upon initial evaluation. Patient had accompanying lower extremity redness and swelling. Patient was placed on Augmentin, after which the patient's symptoms, including the paresthesias, improved. However, he complains of persistent paresthesias. Also notes a sensation of burning in the bilateral calves when walking. The burning sensation subsides with rest. Also notes "a coolness in the toes." He states he is in transition with PCPs and does not currently have one. He does, however, have an appointment with the wound care clinic in [place] on August 6.  Denies increasing pain or swelling in the lower extremities, numbness, weakness, subsequent trauma, gait instability, fever/chills, or any other complaints.  Cardiologist david fitzgerald.     Past Medical History:  Diagnosis Date  . Aortic valve defect   . Atrial fibrillation ([place])   . Gout   . Hypertension     There are no active problems to display for this patient.   Past Surgical History:  Procedure Laterality Date  . AORTIC VALVE REPLACEMENT    . PACEMAKER INSERTION          Home Medications    Prior to Admission medications   Medication Sig Start Date End Date Taking? Authorizing Provider  Cyanocobalamin (VITAMIN B-12 PO) Take 1 tablet by mouth every morning.   Yes [provider]  dofetilide (TIKOSYN) 500 MCG capsule Take 500 mcg by mouth every morning.   Yes [provider]  metoprolol succinate (TOPROL-XL) 100 MG 24 hr tablet Take 100 mg by mouth daily. Take with or immediately following a meal.   Yes [provider]  mupirocin ointment (BACTROBAN) 2 % Apply 1 application topically 2 (two) times daily. 03/11/17  Yes Yu, Amy V, PA-C  ramipril (ALTACE) 2.5 MG capsule Take 2.5 mg by mouth daily.   Yes [provider]  warfarin (COUMADIN) 7.5 MG tablet Take 7.5 mg by mouth daily.   Yes [provider]  warfarin (COUMADIN) 7.5 MG tablet Take 1 tablet (7.5 mg total) by mouth daily. 03/13/17   Lorayne Bender, PA-C    Family History History reviewed. No pertinent family history.  Social History Social History  Substance Use Topics  . Smoking status: Never Smoker  . Smokeless tobacco: Never Used  . Alcohol use Yes     Allergies   Ivp dye [iodinated diagnostic agents]   Review of Systems Review of Systems  Constitutional: Negative for chills and fever.  Respiratory: Negative for shortness of breath.   Cardiovascular: Negative for chest pain.  Musculoskeletal: Negative for back pain and neck pain.  Neurological: Negative  for weakness and numbness.       Paresthesias in the bilateral plantar foot surface.  All other systems reviewed and are negative.    Physical Exam Updated Vital Signs BP (!) 150/78   Pulse 64   Temp 98.2 F (36.8 C) (Oral)   Resp 18   SpO2 98%   Physical Exam  Constitutional: He appears well-developed and well-nourished. No distress.  HENT:  Head: Normocephalic and atraumatic.  Eyes: Conjunctivae are normal.  Neck: Neck supple.  Cardiovascular: Normal rate, regular  rhythm and intact distal pulses.   Pulses:      Popliteal pulses are 2+ on the right side, and 2+ on the left side.       Dorsalis pedis pulses are Detected w/ doppler on the right side, and Detected w/ doppler on the left side.       Posterior tibial pulses are Detected w/ doppler on the right side, and Detected w/ doppler on the left side.  Pulmonary/Chest: Effort normal. No respiratory distress.  Abdominal: There is no guarding.  Musculoskeletal: He exhibits edema. He exhibits no deformity.  2 cm laceration to the anterior left lower leg. Granulation tissue noted in the base of the wound. No noted purulent discharge. Does not look infected. Swelling and erythema without tenderness noted to the left lower leg and foot. Full range of motion without pain in the patient's feet, ankles, knees, and hips. Some coolness noted to the toes bilaterally, but 2 sec capillary refill.   Lymphadenopathy:    He has no cervical adenopathy.  Neurological: He is alert.  No noted sensory deficits. Strength 5/5 in the patient's ankles, knees, and hips. No gait disturbance. Coordination intact.  Skin: Skin is warm and dry. Capillary refill takes less than 2 seconds. He is not diaphoretic.  Psychiatric: He has a normal mood and affect. His behavior is normal.  Nursing note and vitals reviewed.          ED Treatments / Results  Labs (all labs ordered are listed, but only abnormal results are displayed) Labs Reviewed  COMPREHENSIVE METABOLIC PANEL - Abnormal; Notable for the following:       Result Value   Total Protein 8.2 (*)    All other components within normal limits  CBC WITH DIFFERENTIAL/PLATELET  PROTIME-INR  APTT  CBC WITH DIFFERENTIAL/PLATELET  CBC    EKG  EKG Interpretation None       Radiology No results found.  Procedures Procedures (including critical care time)  Medications Ordered in ED Medications  warfarin (COUMADIN) tablet 7.5 mg (7.5 mg Oral Given 03/13/17 2340)      Initial Impression / Assessment and Plan / ED Course  I have reviewed the triage vital signs and the nursing notes.  Pertinent labs & imaging results that were available during my care of the patient were reviewed by me and considered in my medical decision making (see chart for details).  Clinical Course as of Mar 15 35  Wed Mar 13, 2017  2301 Spoke with Karna Christmas, tech from main lab. States their sample for PT/INR and PTT have hemolyzed and will have to be redrawn.  [SJ]  2303 Spoke with patient to clarify his coumadin use. Admits to being out of his coumadin for about a week.  [SJ]    Clinical Course User Index [SJ] Maureen Delatte C, PA-C    Patient presents with paresthesias in the lower extremities. Pulses detectable, however, somewhat lessened on the left. Patient does have sufficient evidence  of circulation out to the tips of the digits, however. Vascular follow up.  Patient to return in the morning for bilateral DVT study. Suspect this will be negative. Patient will also follow up with his cardiologist this week due to his INR level. Patient given strict return precautions. He voices understanding of all instructions and is comfortable with discharge.   Vitals:   03/13/17 1450 03/13/17 1700 03/13/17 2030 03/13/17 2330  BP: (!) 162/90 (!) 150/78 (!) 177/86 (!) 176/100  Pulse: 68 64 (!) 59 65  Resp: 18 18  16   Temp: 97.8 F (36.6 C) 98.2 F (36.8 C)    TempSrc: Oral Oral    SpO2: 100% 98%  99%   Patient states he has not taken his evening dose of Toprol. He has no symptoms of hypertensive emergency.  Final Clinical Impressions(s) / ED Diagnoses   Final diagnoses:  Paresthesia  Swelling of left lower extremity    New Prescriptions Discharge Medication List as of 03/13/2017 11:31 PM    START taking these medications   Details  !! warfarin (COUMADIN) 7.5 MG tablet Take 1 tablet (7.5 mg total) by mouth daily., Starting Wed 03/13/2017, Print     !! - Potential duplicate  medications found. Please discuss with provider.       Lorayne Bender, PA-C 03/14/17 Leandrew Koyanagi    Charlesetta Shanks, MD 03/14/17 2340

## 2017-03-13 NOTE — ED Provider Notes (Signed)
Medical screening examination/treatment/procedure(s) were conducted as a shared visit with non-physician practitioner(s) and myself.  I personally evaluated the patient during the encounter.   EKG Interpretation None     Patient has had a poorly healing leg wound. He does endorse symptoms of claudication with cramping and burning calf pain with ambulation. Time patient is very active. This is a symptom that has developed over the past several months for him. Patient does have known vascular disease. He is anticoagulated on Coumadin. On examination lower extremities are symmetric without peripheral edema. Shows single ulcerated lesion. His hand-held Doppler I can establish both dorsalis pedis and posterior tibial pulses. I have high suspicion for peripheral arterial disease. Patient however does have intact circulation and no signs of acute occlusive disease. Plan will be for close follow-up with vascular surgery.   Charlesetta Shanks, MD 03/14/17 2340

## 2017-03-14 ENCOUNTER — Ambulatory Visit (HOSPITAL_COMMUNITY)
Admission: RE | Admit: 2017-03-14 | Discharge: 2017-03-14 | Disposition: A | Discharge status: Home / Self Care | Payer: Medicare Other | Source: Ambulatory Visit | Attending: Emergency Medicine | Admitting: Emergency Medicine

## 2017-03-14 DIAGNOSIS — X58XXXA Exposure to other specified factors, initial encounter: Secondary | ICD-10-CM | POA: Diagnosis not present

## 2017-03-14 DIAGNOSIS — R600 Localized edema: Secondary | ICD-10-CM | POA: Diagnosis not present

## 2017-03-14 DIAGNOSIS — S81802A Unspecified open wound, left lower leg, initial encounter: Secondary | ICD-10-CM | POA: Diagnosis not present

## 2017-03-14 DIAGNOSIS — R609 Edema, unspecified: Secondary | ICD-10-CM | POA: Diagnosis not present

## 2017-03-14 NOTE — Progress Notes (Signed)
VASCULAR LAB PRELIMINARY  PRELIMINARY  PRELIMINARY  PRELIMINARY  Bilateral lower extremity venous duplex completed.    Preliminary report:  Bilateral:  No evidence of DVT, superficial thrombosis, or Baker's Cyst.   Azyriah Nevins, RVS 03/14/2017, 9:31 AM

## 2017-03-15 ENCOUNTER — Other Ambulatory Visit: Payer: Self-pay

## 2017-03-15 DIAGNOSIS — I739 Peripheral vascular disease, unspecified: Secondary | ICD-10-CM

## 2017-03-18 ENCOUNTER — Encounter: Payer: Medicare Other | Attending: Physician Assistant | Admitting: Physician Assistant

## 2017-03-18 DIAGNOSIS — M109 Gout, unspecified: Secondary | ICD-10-CM | POA: Insufficient documentation

## 2017-03-18 DIAGNOSIS — I739 Peripheral vascular disease, unspecified: Secondary | ICD-10-CM | POA: Diagnosis not present

## 2017-03-18 DIAGNOSIS — X58XXXA Exposure to other specified factors, initial encounter: Secondary | ICD-10-CM | POA: Insufficient documentation

## 2017-03-18 DIAGNOSIS — S81802A Unspecified open wound, left lower leg, initial encounter: Secondary | ICD-10-CM | POA: Diagnosis not present

## 2017-03-18 DIAGNOSIS — I10 Essential (primary) hypertension: Secondary | ICD-10-CM | POA: Diagnosis not present

## 2017-03-18 DIAGNOSIS — S8992XA Unspecified injury of left lower leg, initial encounter: Secondary | ICD-10-CM | POA: Diagnosis not present

## 2017-03-18 DIAGNOSIS — I482 Chronic atrial fibrillation: Secondary | ICD-10-CM | POA: Diagnosis not present

## 2017-03-18 DIAGNOSIS — L97822 Non-pressure chronic ulcer of other part of left lower leg with fat layer exposed: Secondary | ICD-10-CM | POA: Insufficient documentation

## 2017-03-18 DIAGNOSIS — Z952 Presence of prosthetic heart valve: Secondary | ICD-10-CM | POA: Insufficient documentation

## 2017-03-18 NOTE — Progress Notes (Addendum)
DAMEIAN, CRISMAN ([id number]) Visit Report for 03/18/2017 Allergy List Details Patient Name: Edward Marquez, Edward Marquez Date of Service: 03/18/2017 8:00 AM Medical Record Number: [id number] Patient Account Number: [id number] Date of Birth/Sex: 1951/06/10 (66 y.o. Male) Treating RN: Montey Hora Primary Care Alaysia Lightle: SYSTEM, PCP Other Clinician: Referring Yuridia Couts: Referral, Self Treating Blondina Coderre/Extender: STONE III, HOYT Weeks in Treatment: 0 Allergies Active Allergies Iodinated Contrast- Oral and IV Dye Allergy Notes Electronic Signature(s) Signed: 03/18/2017 8:51:18 AM By: Montey Hora Entered By: Montey Hora on 03/18/2017 08:51:18 Edward Marquez ([id number]) -------------------------------------------------------------------------------- Arrival Information Details Patient Name: Edward Marquez Date of Service: 03/18/2017 8:00 AM Medical Record Number: [id number] Patient Account Number: [id number] Date of Birth/Sex: Dec 29, 1950 (66 y.o. Male) Treating RN: Montey Hora Primary Care Avyonna Wagoner: SYSTEM, PCP Other Clinician: Referring Chesnie Capell: Referral, Self Treating Blayne Garlick/Extender: STONE III, HOYT Weeks in Treatment: 0 Visit Information Patient Arrived: Ambulatory Arrival Time: 08:13 Accompanied By: self Transfer Assistance: None Patient Identification Verified: Yes Secondary Verification Process Yes Completed: Patient Has Alerts: Yes Patient Alerts: ABI [place] BILATERAL >220 Electronic Signature(s) Signed: 03/18/2017 8:46:58 AM By: Montey Hora Entered By: Montey Hora on 03/18/2017 08:46:58 Edward Marquez ([id number]) -------------------------------------------------------------------------------- Clinic Level of Care Assessment Details Patient Name: Edward Marquez Date of Service: 03/18/2017 8:00 AM Medical Record Number: [id number] Patient Account Number: [id number] Date of Birth/Sex: 08-Oct-1950 (66 y.o. Male) Treating RN: Montey Hora Primary Care Kira Hartl: SYSTEM, PCP Other  Clinician: Referring Deneshia Zucker: Referral, Self Treating Rayonna Heldman/Extender: STONE III, HOYT Weeks in Treatment: 0 Clinic Level of Care Assessment Items TOOL 2 Quantity Score []  - Use when only an EandM is performed on the INITIAL visit 0 ASSESSMENTS - Nursing Assessment / Reassessment X - General Physical Exam (combine w/ comprehensive assessment (listed just 1 20 below) when performed on new pt. evals) X - Comprehensive Assessment (HX, ROS, Risk Assessments, Wounds Hx, etc.) 1 25 ASSESSMENTS - Wound and Skin Assessment / Reassessment X - Simple Wound Assessment / Reassessment - one wound 1 5 []  - Complex Wound Assessment / Reassessment - multiple wounds 0 []  - Dermatologic / Skin Assessment (not related to wound area) 0 ASSESSMENTS - Ostomy and/or Continence Assessment and Care []  - Incontinence Assessment and Management 0 []  - Ostomy Care Assessment and Management (repouching, etc.) 0 PROCESS - Coordination of Care X - Simple Patient / Family Education for ongoing care 1 15 []  - Complex (extensive) Patient / Family Education for ongoing care 0 X - Staff obtains Programmer, systems, Records, Test Results / Process Orders 1 10 []  - Staff telephones HHA, Nursing Homes / Clarify orders / etc 0 []  - Routine Transfer to another Facility (non-emergent condition) 0 []  - Routine Hospital Admission (non-emergent condition) 0 []  - New Admissions / Biomedical engineer / Ordering NPWT, Apligraf, etc. 0 []  - Emergency Hospital Admission (emergent condition) 0 X - Simple Discharge Coordination 1 10 Edward Marquez, Edward Marquez ([id number]) []  - Complex (extensive) Discharge Coordination 0 PROCESS - Special Needs []  - Pediatric / Minor Patient Management 0 []  - Isolation Patient Management 0 []  - Hearing / Language / Visual special needs 0 []  - Assessment of Community assistance (transportation, D/C planning, etc.) 0 []  - Additional assistance / Altered mentation 0 []  - Support Surface(s) Assessment (bed, cushion,  seat, etc.) 0 INTERVENTIONS - Wound Cleansing / Measurement X - Wound Imaging (photographs - any number of wounds) 1 5 []  - Wound Tracing (instead of photographs) 0 X - Simple Wound Measurement - one wound 1 5 []  - Complex Wound Measurement - multiple wounds 0 X - Simple Wound  Cleansing - one wound 1 5 []  - Complex Wound Cleansing - multiple wounds 0 INTERVENTIONS - Wound Dressings X - Small Wound Dressing one or multiple wounds 1 10 []  - Medium Wound Dressing one or multiple wounds 0 []  - Large Wound Dressing one or multiple wounds 0 []  - Application of Medications - injection 0 INTERVENTIONS - Miscellaneous []  - External ear exam 0 []  - Specimen Collection (cultures, biopsies, blood, body fluids, etc.) 0 []  - Specimen(s) / Culture(s) sent or taken to Lab for analysis 0 []  - Patient Transfer (multiple staff / Harrel Lemon Lift / Similar devices) 0 []  - Simple Staple / Suture removal (25 or less) 0 []  - Complex Staple / Suture removal (26 or more) 0 Edward Marquez, Edward Marquez ([id number]) []  - Hypo / Hyperglycemic Management (close monitor of Blood Glucose) 0 X - Ankle / Brachial Index (ABI) - do not check if billed separately 1 15 Has the patient been seen at the hospital within the last three years: Yes Total Score: 125 Level Of Care: New/Established - Level 4 Electronic Signature(s) Signed: 03/18/2017 4:48:06 PM By: Montey Hora Entered By: Montey Hora on 03/18/2017 09:54:19 Edward Marquez ([id number]) -------------------------------------------------------------------------------- Encounter Discharge Information Details Patient Name: Edward Marquez Date of Service: 03/18/2017 8:00 AM Medical Record Number: [id number] Patient Account Number: [id number] Date of Birth/Sex: 1950-09-09 (66 y.o. Male) Treating RN: Montey Hora Primary Care Atharva Mirsky: SYSTEM, PCP Other Clinician: Referring Myers Tutterow: Referral, Self Treating Kimmie Berggren/Extender: STONE III, HOYT Weeks in Treatment: 0 Encounter Discharge  Information Items Discharge Pain Level: 0 Discharge Condition: Stable Ambulatory Status: Ambulatory Discharge Destination: Home Transportation: Private Auto Accompanied By: self Schedule Follow-up Appointment: Yes Medication Reconciliation completed and provided to Patient/Care No Math Brazie: Provided on Clinical Summary of Care: 03/18/2017 Form Type Recipient Paper Patient BW Electronic Signature(s) Signed: 03/18/2017 9:29:34 AM By: Ruthine Dose Entered By: Ruthine Dose on 03/18/2017 09:29:33 Edward Marquez ([id number]) -------------------------------------------------------------------------------- Lower Extremity Assessment Details Patient Name: Edward Marquez Date of Service: 03/18/2017 8:00 AM Medical Record Number: [id number] Patient Account Number: [id number] Date of Birth/Sex: 09/06/1950 (66 y.o. Male) Treating RN: Montey Hora Primary Care Yanixan Mellinger: SYSTEM, PCP Other Clinician: Referring Yoshi Mancillas: Referral, Self Treating Verneta Hamidi/Extender: STONE III, HOYT Weeks in Treatment: 0 Edema Assessment Assessed: [Left: No] [Right: No] Edema: [Left: No] [Right: No] Calf Left: Right: Point of Measurement: 32 cm From Medial Instep 35.9 cm 35.5 cm Ankle Left: Right: Point of Measurement: 10 cm From Medial Instep 23.3 cm 22.2 cm Vascular Assessment Pulses: Dorsalis Pedis Palpable: [Left:Yes] [Right:Yes] Doppler Audible: [Left:Yes] [Right:Yes] Posterior Tibial Palpable: [Left:Yes] [Right:Yes] Doppler Audible: [Left:Yes] [Right:Yes] Extremity colors, hair growth, and conditions: Extremity Color: [Left:Red] [Right:Normal] Hair Growth on Extremity: [Left:Yes] [Right:Yes] Temperature of Extremity: [Left:Warm] [Right:Warm] Capillary Refill: [Left:< 3 seconds] [Right:< 3 seconds] Toe Nail Assessment Left: Right: Thick: No No Discolored: No No Deformed: No No Improper Length and Hygiene: No No Notes ABI [place] BILATERAL >220 Edward Marquez, Edward Marquez ([id number]) Electronic  Signature(s) Signed: 03/18/2017 8:51:04 AM By: Montey Hora Entered By: Montey Hora on 03/18/2017 08:51:04 Edward Marquez ([id number]) -------------------------------------------------------------------------------- Multi Wound Chart Details Patient Name: Edward Marquez Date of Service: 03/18/2017 8:00 AM Medical Record Number: [id number] Patient Account Number: [id number] Date of Birth/Sex: 1951/03/02 (67 y.o. Male) Treating RN: Montey Hora Primary Care Steen Bisig: SYSTEM, PCP Other Clinician: Referring Quenna Doepke: Referral, Self Treating Loye Reininger/Extender: STONE III, HOYT Weeks in Treatment: 0 Vital Signs Height(in): 70 Pulse(bpm): 63 Weight(lbs): 180 Blood Pressure 128/58 (mmHg): Body Mass Index(BMI): 26 Temperature(F): 97.7 Respiratory Rate 18 (breaths/min): Photos: [1:No Photos] [N/A:N/A] Wound Location: [1:Left  Lower Leg - Anterior] [N/A:N/A] Wounding Event: [1:Trauma] [N/A:N/A] Primary Etiology: [1:Trauma, Other] [N/A:N/A] Date Acquired: [1:02/15/2017] [N/A:N/A] Weeks of Treatment: [1:0] [N/A:N/A] Wound Status: [1:Open] [N/A:N/A] Measurements L x W x D 1.5x0.5x0.2 [N/A:N/A] (cm) Area (cm) : [1:0.589] [N/A:N/A] Volume (cm) : [1:0.118] [N/A:N/A] Classification: [1:Full Thickness With Exposed Support Structures] [N/A:N/A] Exudate Amount: [1:Large] [N/A:N/A] Exudate Type: [1:Serous] [N/A:N/A] Exudate Color: [1:amber] [N/A:N/A] Wound Margin: [1:Flat and Intact] [N/A:N/A] Granulation Amount: [1:None Present (0%)] [N/A:N/A] Necrotic Amount: [1:Large (67-100%)] [N/A:N/A] Necrotic Tissue: [1:Eschar, Adherent Slough] [N/A:N/A] Exposed Structures: [1:Fat Layer (Subcutaneous Tissue) Exposed: Yes Fascia: No Tendon: No Muscle: No Joint: No Bone: No] [N/A:N/A] Epithelialization: [1:None] [N/A:N/A] Periwound Skin Texture: [N/A:N/A] Excoriation: No Induration: No Callus: No Crepitus: No Rash: No Scarring: No Periwound Skin Maceration: No N/A N/A Moisture: Dry/Scaly:  No Periwound Skin Color: Erythema: Yes N/A N/A Atrophie Blanche: No Cyanosis: No Ecchymosis: No Hemosiderin Staining: No Mottled: No Pallor: No Rubor: No Erythema Location: Circumferential N/A N/A Temperature: No Abnormality N/A N/A Tenderness on Yes N/A N/A Palpation: Wound Preparation: Ulcer Cleansing: N/A N/A Rinsed/Irrigated with Saline Topical Anesthetic Applied: Other: lidocaine 4% Treatment Notes Electronic Signature(s) Signed: 03/18/2017 4:48:06 PM By: Montey Hora Entered By: Montey Hora on 03/18/2017 09:09:42 Edward Marquez ([id number]) -------------------------------------------------------------------------------- Multi-Disciplinary Care Plan Details Patient Name: Edward Marquez Date of Service: 03/18/2017 8:00 AM Medical Record Number: [id number] Patient Account Number: [id number] Date of Birth/Sex: 04-May-1951 (66 y.o. Male) Treating RN: Montey Hora Primary Care Mellie Buccellato: SYSTEM, PCP Other Clinician: Referring Tyquez Hollibaugh: Referral, Self Treating Jamarl Pew/Extender: STONE III, HOYT Weeks in Treatment: 0 Active Inactive ` Orientation to the Wound Care Program Nursing Diagnoses: Knowledge deficit related to the wound healing center program Goals: Patient/caregiver will verbalize understanding of the [facility] Program Date Initiated: 03/18/2017 Target Resolution Date: 05/24/2017 Goal Status: Active Interventions: Provide education on orientation to the wound center Notes: ` Wound/Skin Impairment Nursing Diagnoses: Impaired tissue integrity Goals: Ulcer/skin breakdown will have a volume reduction of 30% by week 4 Date Initiated: 03/18/2017 Target Resolution Date: 05/24/2017 Goal Status: Active Ulcer/skin breakdown will have a volume reduction of 50% by week 8 Date Initiated: 03/18/2017 Target Resolution Date: 05/24/2017 Goal Status: Active Ulcer/skin breakdown will have a volume reduction of 80% by week 12 Date Initiated: 03/18/2017 Target  Resolution Date: 05/24/2017 Goal Status: Active Ulcer/skin breakdown will heal within 14 weeks Date Initiated: 03/18/2017 Target Resolution Date: 05/24/2017 Goal Status: Active Interventions: Edward Marquez, Edward Marquez ([id number]) Assess patient/caregiver ability to obtain necessary supplies Assess patient/caregiver ability to perform ulcer/skin care regimen upon admission and as needed Assess ulceration(s) every visit Notes: Electronic Signature(s) Signed: 03/18/2017 4:48:06 PM By: Montey Hora Entered By: Montey Hora on 03/18/2017 09:09:30 Edward Marquez ([id number]) -------------------------------------------------------------------------------- Pain Assessment Details Patient Name: Edward Marquez Date of Service: 03/18/2017 8:00 AM Medical Record Number: [id number] Patient Account Number: [id number] Date of Birth/Sex: March 31, 1951 (66 y.o. Male) Treating RN: Montey Hora Primary Care Valary Manahan: SYSTEM, PCP Other Clinician: Referring Atley Neubert: Referral, Self Treating Daylen Hack/Extender: STONE III, HOYT Weeks in Treatment: 0 Active Problems Location of Pain Severity and Description of Pain Patient Has Paino Yes Site Locations Pain Location: Pain in Ulcers With Dressing Change: Yes Duration of the Pain. Constant / Intermittento Constant Character of Pain Describe the Pain: Other: sore Pain Management and Medication Current Pain Management: Electronic Signature(s) Signed: 03/18/2017 8:47:18 AM By: Montey Hora Entered By: Montey Hora on 03/18/2017 08:47:18 Edward Marquez ([id number]) -------------------------------------------------------------------------------- Patient/Caregiver Education Details Patient Name: Edward Marquez Date of Service: 03/18/2017 8:00 AM Medical Record Number: [id number] Patient Account Number: [id number]  Date of Birth/Gender: Jul 18, 1951 (66 y.o. Male) Treating RN: Montey Hora Primary Care Physician: SYSTEM, PCP Other Clinician: Referring Physician:  Referral, Self Treating Physician/Extender: STONE III, HOYT Weeks in Treatment: 0 Education Assessment Education Provided To: Patient Education Topics Provided Wound/Skin Impairment: Handouts: Other: wound care as ordered Methods: Demonstration, Explain/Verbal Responses: State content correctly Electronic Signature(s) Signed: 03/18/2017 4:48:06 PM By: Montey Hora Entered By: Montey Hora on 03/18/2017 09:10:41 Edward Marquez ([id number]) -------------------------------------------------------------------------------- Wound Assessment Details Patient Name: Edward Marquez Date of Service: 03/18/2017 8:00 AM Medical Record Number: [id number] Patient Account Number: [id number] Date of Birth/Sex: 1951-04-12 (66 y.o. Male) Treating RN: Montey Hora Primary Care Tynesia Harral: SYSTEM, PCP Other Clinician: Referring Cartel Mauss: Referral, Self Treating Ersel Enslin/Extender: STONE III, HOYT Weeks in Treatment: 0 Wound Status Wound Number: 1 Primary Etiology: Trauma, Other Wound Location: Left Lower Leg - Anterior Wound Status: Open Wounding Event: Trauma Date Acquired: 02/15/2017 Weeks Of Treatment: 0 Clustered Wound: No Photos Photo Uploaded By: Montey Hora on 03/18/2017 09:59:54 Wound Measurements Length: (cm) 1.5 Width: (cm) 0.5 Depth: (cm) 0.2 Area: (cm) 0.589 Volume: (cm) 0.118 % Reduction in Area: % Reduction in Volume: Epithelialization: None Tunneling: No Undermining: No Wound Description Full Thickness With Exposed Foul Odor After Classification: Support Structures Slough/Fibrino Wound Margin: Flat and Intact Exudate Large Amount: Exudate Type: Serous Exudate Color: amber Cleansing: No Yes Wound Bed Granulation Amount: None Present (0%) Exposed Structure Necrotic Amount: Large (67-100%) Fascia Exposed: No Necrotic Quality: Eschar, Adherent Slough Fat Layer (Subcutaneous Tissue) Exposed: Yes Edward Marquez, Edward Marquez ([id number]) Tendon Exposed: No Muscle Exposed:  No Joint Exposed: No Bone Exposed: No Periwound Skin Texture Texture Color No Abnormalities Noted: No No Abnormalities Noted: No Callus: No Atrophie Blanche: No Crepitus: No Cyanosis: No Excoriation: No Ecchymosis: No Induration: No Erythema: Yes Rash: No Erythema Location: Circumferential Scarring: No Hemosiderin Staining: No Mottled: No Moisture Pallor: No No Abnormalities Noted: No Rubor: No Dry / Scaly: No Maceration: No Temperature / Pain Temperature: No Abnormality Tenderness on Palpation: Yes Wound Preparation Ulcer Cleansing: Rinsed/Irrigated with Saline Topical Anesthetic Applied: Other: lidocaine 4%, Treatment Notes Wound #1 (Left, Anterior Lower Leg) 1. Cleansed with: Clean wound with Normal Saline 2. Anesthetic Topical Lidocaine 4% cream to wound bed prior to debridement 4. Dressing Applied: Santyl Ointment 5. Secondary Dressing Applied Dry [place] Signature(s) Signed: 03/18/2017 8:49:36 AM By: Montey Hora Entered By: Montey Hora on 03/18/2017 08:49:36 Edward Marquez ([id number]) -------------------------------------------------------------------------------- [place] Details Patient Name: Edward Marquez Date of Service: 03/18/2017 8:00 AM Medical Record Number: [id number] Patient Account Number: [id number] Date of Birth/Sex: 03-Dec-1950 (66 y.o. Male) Treating RN: Montey Hora Primary Care Daton Szilagyi: SYSTEM, PCP Other Clinician: Referring Raquan Iannone: Referral, Self Treating Zyonna Vardaman/Extender: STONE III, HOYT Weeks in Treatment: 0 Vital Signs Time Taken: 08:16 Temperature (F): 97.7 Height (in): 70 Pulse (bpm): 63 Source: Measured Respiratory Rate (breaths/min): 18 Weight (lbs): 180 Blood Pressure (mmHg): 128/58 Source: Measured Reference Range: 80 - 120 mg / dl Body Mass Index (BMI): 25.8 Electronic Signature(s) Signed: 03/18/2017 8:48:18 AM By: Montey Hora Entered By: Montey Hora on 03/18/2017 08:48:17

## 2017-03-18 NOTE — Progress Notes (Signed)
JERID, CATHERMAN ([id number]) Visit Report for 03/18/2017 Abuse/Suicide Risk Screen Details Patient Name: Marquez, [name] Date of Service: 03/18/2017 8:00 AM Medical Record Number: [id number] Patient Account Number: [id number] Date of Birth/Sex: 17-Aug-1950 (66 y.o. Male) Treating RN: Montey Hora Primary Care Eulises Kijowski: SYSTEM, PCP Other Clinician: Referring Zyairah Wacha: Referral, Self Treating Terrian Sentell/Extender: STONE III, HOYT Weeks in Treatment: 0 Abuse/Suicide Risk Screen Items Answer ABUSE/SUICIDE RISK SCREEN: Has anyone close to you tried to hurt or harm you recentlyo No Do you feel uncomfortable with anyone in your familyo No Has anyone forced you do things that you didnot want to doo No Do you have any thoughts of harming yourselfo No Patient displays signs or symptoms of abuse and/or neglect. No Electronic Signature(s) Signed: 03/18/2017 8:53:26 AM By: Montey Hora Entered By: Montey Hora on 03/18/2017 08:53:26 Edward Marquez ([id number]) -------------------------------------------------------------------------------- Activities of Daily Living Details Patient Name: Edward Marquez Date of Service: 03/18/2017 8:00 AM Medical Record Number: [id number] Patient Account Number: [id number] Date of Birth/Sex: [birth year] (66 y.o. Male) Treating RN: Montey Hora Primary Care Esther Bradstreet: SYSTEM, PCP Other Clinician: Referring Nolin Grell: Referral, Self Treating Courvoisier Hamblen/Extender: STONE III, HOYT Weeks in Treatment: 0 Activities of Daily Living Items Answer Activities of Daily Living (Please select one for each item) Drive Automobile Completely Able Take Medications Completely Able Use Telephone Completely Able Care for Appearance Completely Able Use Toilet Completely Able Bath / Shower Completely Able Dress Self Completely Able Feed Self Completely Able Walk Completely Able Get In / Out Bed Completely Able Housework Completely Able Prepare Meals Completely [place] for Self Completely Able Electronic Signature(s) Signed: 03/18/2017 8:53:48 AM By: Montey Hora Entered By: Montey Hora on 03/18/2017 08:53:48 Edward Marquez ([id number]) -------------------------------------------------------------------------------- Education Assessment Details Patient Name: Edward Marquez Date of Service: 03/18/2017 8:00 AM Medical Record Number: [id number] Patient Account Number: [id number] Date of Birth/Sex: 16-Aug-1950 (66 y.o. Male) Treating RN: Montey Hora Primary Care Tibor Lemmons: SYSTEM, PCP Other Clinician: Referring Dylan Monforte: Referral, Self Treating Bekka Qian/Extender: STONE III, HOYT Weeks in Treatment: 0 Primary Learner Assessed: Patient Learning Preferences/Education Level/Primary Language Learning Preference: Explanation, Demonstration Highest Education Level: College or Above Preferred Language: English Cognitive Barrier Assessment/Beliefs Language Barrier: No Translator Needed: No Memory Deficit: No Emotional Barrier: No Cultural/Religious Beliefs Affecting Medical No Care: Physical Barrier Assessment Impaired Vision: No Impaired Hearing: No Decreased Hand dexterity: No Knowledge/Comprehension Assessment Knowledge Level: Medium Comprehension Level: Medium Ability to understand written Medium instructions: Ability to understand verbal Medium instructions: Motivation Assessment Anxiety Level: Calm Cooperation: Cooperative Education Importance: Acknowledges Need Interest in Health Problems: Asks Questions Perception: Coherent Willingness to Engage in Self- Medium Management Activities: Readiness to Engage in Self- Medium Management Activities: Electronic Signature(s) [place], [place] ([id number]) Signed: 03/18/2017 8:54:11 AM By: Montey Hora Entered By: Montey Hora on 03/18/2017 08:54:11 Edward Marquez ([id number]) -------------------------------------------------------------------------------- Fall Risk  Assessment Details Patient Name: Edward Marquez Date of Service: 03/18/2017 8:00 AM Medical Record Number: [id number] Patient Account Number: [id number] Date of Birth/Sex: 11-Jun-1951 (66 y.o. Male) Treating RN: Montey Hora Primary Care Brie Eppard: SYSTEM, PCP Other Clinician: Referring Isys Tietje: Referral, Self Treating Parris Signer/Extender: STONE III, HOYT Weeks in Treatment: 0 Fall Risk Assessment Items Have you had 2 or more falls in the last 12 monthso 0 No Have you had any fall that resulted in injury in the last 12 monthso 0 No FALL RISK ASSESSMENT: History of falling - immediate or within 3 months 0 No Secondary diagnosis 0 No Ambulatory aid None/bed rest/wheelchair/nurse 0 Yes Crutches/cane/walker 0 No Furniture 0 No IV Access/Saline Lock  0 No Gait/Training Normal/bed rest/immobile 0 Yes Weak 0 No Impaired 0 No Mental Status Oriented to own ability 0 Yes Electronic Signature(s) Signed: 03/18/2017 8:54:23 AM By: Montey Hora Entered By: Montey Hora on 03/18/2017 08:54:22 Edward Marquez ([id number]) -------------------------------------------------------------------------------- Foot Assessment Details Patient Name: Edward Marquez Date of Service: 03/18/2017 8:00 AM Medical Record Number: [id number] Patient Account Number: [id number] Date of Birth/Sex: [birth year] (66 y.o. Male) Treating RN: Montey Hora Primary Care Nevelyn Mellott: SYSTEM, PCP Other Clinician: Referring Stephanye Finnicum: Referral, Self Treating Zaria Taha/Extender: STONE III, HOYT Weeks in Treatment: 0 Foot Assessment Items Site Locations + = Sensation present, - = Sensation absent, C = Callus, U = Ulcer R = Redness, W = Warmth, M = Maceration, PU = Pre-ulcerative lesion F = Fissure, S = Swelling, D = Dryness Assessment Right: Left: Other Deformity: No No Prior Foot Ulcer: No No Prior Amputation: No No Charcot Joint: No No Ambulatory Status: Ambulatory Without Help Gait: Steady Electronic Signature(s) Signed:  03/18/2017 8:54:51 AM By: Montey Hora Entered By: Montey Hora on 03/18/2017 08:54:51 Edward Marquez ([id number]) -------------------------------------------------------------------------------- Nutrition Risk Assessment Details Patient Name: Edward Marquez Date of Service: 03/18/2017 8:00 AM Medical Record Number: [id number] Patient Account Number: [id number] Date of Birth/Sex: [birth year] (66 y.o. Male) Treating RN: Montey Hora Primary Care Macklin Jacquin: SYSTEM, PCP Other Clinician: Referring Royer Cristobal: Referral, Self Treating Judythe Postema/Extender: STONE III, HOYT Weeks in Treatment: 0 Height (in): 70 Weight (lbs): 180 Body Mass Index (BMI): 25.8 Nutrition Risk Assessment Items NUTRITION RISK SCREEN: I have an illness or condition that made me change the kind and/or 0 No amount of food I eat I eat fewer than two meals per day 0 No I eat few fruits and vegetables, or milk products 0 No I have three or more drinks of beer, liquor or wine almost every day 0 No I have tooth or mouth problems that make it hard for me to eat 0 No I don't always have enough money to buy the food I need 0 No I eat alone most of the time 0 No I take three or more different prescribed or over-the-counter drugs a 1 Yes day Without wanting to, I have lost or gained 10 pounds in the last six 0 No months I am not always physically able to shop, cook and/or feed myself 0 No Nutrition Protocols Good Risk Protocol 0 No interventions needed Moderate Risk Protocol Electronic Signature(s) Signed: 03/18/2017 8:54:31 AM By: Montey Hora Entered By: Montey Hora on 03/18/2017 08:54:31

## 2017-03-19 NOTE — Progress Notes (Signed)
SHYHEIM, TANNEY ([id number]) Visit Report for 03/18/2017 Chief Complaint Document Details Patient Name: Edward Marquez, Edward Marquez Date of Service: 03/18/2017 8:00 AM Medical Record Number: [id number] Patient Account Number: [id number] Date of Birth/Sex: September 29, 1950 (66 y.o. Male) Treating RN: Montey Hora Primary Care Provider: SYSTEM, PCP Other Clinician: Referring Provider: Referral, Self Treating Provider/Extender: STONE III, Luan Urbani Weeks in Treatment: 0 Information Obtained from: Patient Chief Complaint Left Anterior LE Ulcer Electronic Signature(s) Signed: 03/18/2017 4:13:08 PM By: Worthy Keeler PA-C Entered By: Worthy Keeler on 03/18/2017 11:15:49 Edward Marquez ([id number]) -------------------------------------------------------------------------------- HPI Details Patient Name: Edward Marquez Date of Service: 03/18/2017 8:00 AM Medical Record Number: [id number] Patient Account Number: [id number] Date of Birth/Sex: Jan 10, 1951 (66 y.o. Male) Treating RN: Montey Hora Primary Care Provider: SYSTEM, PCP Other Clinician: Referring Provider: Referral, Self Treating Provider/Extender: Melburn Hake, Manav Pierotti Weeks in Treatment: 0 History of Present Illness HPI Description: 03/18/17 patient presents today for initial evaluation concerning an injury which occurred roughly 2 months ago when he was using a tiller at his home. He tells me that he was carrying this when it cut his left anterior shin. He did not go see anyone immediately as he felt that it would just heal on its own but obviously he tells me that's not the case. This ended up red and inflamed surrounding and he was diagnosed with cellulitis at the urgent care when he was seen on 02/19/17. He was started on Augmentin at that time and then had a follow-up wound care check on 03/11/17 at urgent care as well. Fortunately there is no evidence of infection at that point the Bactroban was prescribed for him at that time. He has been using that sense.  Patient is unaware of the fact that he had issues with blood flow in his lower extremities bilaterally until all this happened and they began evaluating him at urgent care. Subsequently he has been scheduled for an arterial study with ABI and TBI on 03/15/17 and this will be scheduled for 04/10/17. 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 He states that he has discomfort about one out of 10 at rest described as a sharp/burning sensation that is worsened to four out of 10 with cleansing the wound. Other than those medical conditions patient appears to be in good health and remains physically active even at 66 years of age. Electronic Signature(s) Signed: 03/18/2017 4:13:08 PM By: Worthy Keeler PA-C Entered By: Worthy Keeler on 03/18/2017 11:21:38 Edward Marquez ([id number]) -------------------------------------------------------------------------------- Physical Exam Details Patient Name: Edward Marquez Date of Service: 03/18/2017 8:00 AM Medical Record Number: [id number] Patient Account Number: [id number] Date of Birth/Sex: 01-Dec-1950 (66 y.o. Male) Treating RN: Montey Hora Primary Care Provider: SYSTEM, PCP Other Clinician: Referring Provider: Referral, Self Treating Provider/Extender: STONE III, Deryl Ports Weeks in Treatment: 0 Constitutional sitting or standing blood pressure is within target range for patient.. pulse regular and within target range for patient.Marland Kitchen respirations regular, non-labored and within target range for patient.Marland Kitchen temperature within target range for patient.. Well-nourished and well-hydrated in no acute distress. Eyes conjunctiva clear no eyelid edema noted. pupils equal round and reactive to light and accommodation. Ears, Nose, Mouth, and Throat no gross abnormality  of ear auricles or external auditory canals. normal hearing noted during conversation. mucus membranes moist. Respiratory normal breathing without difficulty. clear to  auscultation bilaterally. Cardiovascular regular rate and rhythm with normal S1, S2. Trace posterior tibial and dorsalis pedis pulses bilateral lower extremities. no clubbing, cyanosis, significant edema, <3 sec cap refill. Gastrointestinal (GI) soft, non-tender, non-distended, +BS. no ventral hernia noted. Musculoskeletal normal gait and posture. no significant deformity or arthritic changes, no loss or range of motion, no clubbing. Psychiatric this patient is able to make decisions and demonstrates good insight into disease process. Alert and Oriented x 3. pleasant and cooperative. Electronic Signature(s) Signed: 03/18/2017 4:13:08 PM By: Worthy Keeler PA-C Entered By: Worthy Keeler on 03/18/2017 11:26:41 Edward Marquez ([id number]) -------------------------------------------------------------------------------- Physician Orders Details Patient Name: Edward Marquez Date of Service: 03/18/2017 8:00 AM Medical Record Number: [id number] Patient Account Number: [id number] Date of Birth/Sex: 26-Feb-1951 (66 y.o. 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 Anesthetic Wound #1 Left,Anterior Lower Leg o Topical Lidocaine 4% cream applied to wound  bed prior to debridement Skin Barriers/Peri-Wound Care Wound #1 Left,Anterior Lower Leg o Skin Prep Primary Wound Dressing Wound #1 Left,Anterior Lower Leg o Santyl Ointment Secondary Dressing Wound #1 Left,Anterior Lower Leg o Dry Gauze o Boardered Foam Dressing Dressing Change Frequency Wound #1 Left,Anterior Lower Leg o Change dressing every day. Follow-up Appointments ELRIC, TIRADO ([id number]) Wound #1 Left,Anterior Lower Leg o Return Appointment in 1 week. Edema Control Wound #1 Left,Anterior Lower Leg o Elevate legs to the level of the heart and pump ankles as often as possible Additional Orders / Instructions Wound #1 Left,Anterior Lower Leg o Increase protein intake. o Other: - Please add vitamin A, vitamin C and zinc supplements to your diet Medications-please add to medication list. Wound #1 Left,Anterior Lower Leg o Santyl Enzymatic Ointment Patient Medications Allergies: Iodinated Contrast- Oral and IV Dye Notifications Medication Indication Start End Santyl 03/18/2017 DOSE topical 250 unit/gram ointment - ointment topical apply nickel thick to the left lower leg wound daily then cover with dressing Notes At this point due to the fact that patient has slough covering his wound and is still scheduled for arterial studies but has not had them as of yet I'm going to recommend that we go ahead and start him on sample per the above wound care orders. No compression therapy will be initiated although to be honest I do not feel is likely necessary just based on evaluation today. We will see were things stand in one weeks time and make adjustments depending on how his wound is progressing. Obviously I do think he could benefit from debridement but we will hold off until his arterial study which is actually scheduled for 10 April 2017. If anything worsens in the interim patient will contact the office for additional recommendations otherwise we will  see him in one week. Electronic Signature(s) Signed: 03/18/2017 12:05:43 PM By: Worthy Keeler PA-C Entered By: Worthy Keeler on 03/18/2017 12:05:43 Edward Marquez ([id number]) -------------------------------------------------------------------------------- Problem List Details Patient Name: Edward Marquez Date of Service: 03/18/2017 8:00 AM Medical Record Number: [id number] Patient Account Number: [id number] Date of Birth/Sex: [birth year] (66 y.o. 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 Male) Treating RN: Montey Hora Primary Care Provider: SYSTEM, PCP Other Clinician: Referring Provider: Referral, Self Treating Provider/Extender: STONE III, Ople Girgis Weeks in Treatment: 0 Subjective Chief Complaint Information obtained from Patient Left Anterior LE Ulcer History of Present Illness (HPI) 03/18/17 patient presents today for initial evaluation concerning an injury which  occurred roughly 2 months ago when he was using a tiller at his home. He tells me that he was carrying this when it cut his left anterior shin. He did not go see anyone immediately as he felt that it would just heal on its own but obviously he tells me that's not the case. This ended up red and inflamed surrounding and he was diagnosed with cellulitis at the urgent care when he was seen on 02/19/17. He was started on Augmentin at that time and then had a follow-up wound care check on 03/11/17 at urgent care as well. Fortunately there is no evidence of infection at that point the Bactroban was prescribed for him at that time. He has been using that sense. Patient is unaware of the fact that he had issues with blood flow in his lower extremities bilaterally until all this happened and they began evaluating him at urgent care. Subsequently he has been scheduled for an arterial study with ABI and TBI on 03/15/17 and this will be scheduled for 04/10/17. 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 He states that he has discomfort about one out of 10 at rest described as a sharp/burning sensation that is worsened to four out of 10 with cleansing the wound. Other than those medical conditions patient appears to be in good health and remains physically active even at 66 years of age. Wound History Patient presents with 1 open wound that has been present for approximately 1 month. Patient has been treating wound in the following manner: mupirocin. Laboratory tests have not been performed in the last month. Patient reportedly has not tested positive for an antibiotic resistant organism. Patient reportedly has not tested positive for osteomyelitis. Patient reportedly has had testing performed to evaluate  circulation in the legs. Patient History Information obtained from Patient. Allergies Iodinated Contrast- Oral and IV Dye Social History Never smoker, Marital Status - Married, Alcohol Use - Daily, Drug Use - No History, Caffeine Use - Daily. ZUBAIR, LOFTON ([id number]) Medical History Cardiovascular Patient has history of Arrhythmia - a fib, Hypertension Denies history of Angina Musculoskeletal Patient has history of Gout Medical And Surgical History Notes Cardiovascular aortic valve replacement Review of Systems (  ROS) Constitutional Symptoms (General Health) The patient has no complaints or symptoms. Eyes The patient has no complaints or symptoms. Ear/Nose/Mouth/Throat The patient has no complaints or symptoms. Hematologic/Lymphatic The patient has no complaints or symptoms. Respiratory The patient has no complaints or symptoms. Gastrointestinal The patient has no complaints or symptoms. Endocrine The patient has no complaints or symptoms. Genitourinary The patient has no complaints or symptoms. Immunological The patient has no complaints or symptoms. Integumentary (Skin) The patient has no complaints or symptoms. Musculoskeletal The patient has no complaints or symptoms. Neurologic The patient has no complaints or symptoms. Oncologic The patient has no complaints or symptoms. Psychiatric The patient has no complaints or symptoms. Objective Satcher, Jathen ([id number]) Constitutional sitting or standing blood pressure is within target range for patient.. pulse regular and within target range for patient.Marland Kitchen respirations regular, non-labored and within target range for patient.Marland Kitchen temperature within target range for patient.. Well-nourished and well-hydrated in no acute distress. Vitals Time Taken: 8:16 AM, Height: 70 in, Source: Measured, Weight: 180 lbs, Source: Measured, BMI: 25.8, Temperature: 97.7 F, Pulse: 63 bpm, Respiratory Rate: 18 breaths/min, Blood  Pressure: 128/58 mmHg. Eyes conjunctiva clear no eyelid edema noted. pupils equal round and reactive to light and accommodation. Ears, Nose, Mouth, and Throat no gross abnormality of ear auricles or external auditory canals. normal hearing noted during conversation. mucus membranes moist. Respiratory normal breathing without difficulty. clear to auscultation bilaterally. Cardiovascular regular rate and rhythm with normal S1, S2. Trace posterior tibial and dorsalis pedis pulses bilateral lower extremities. no clubbing, cyanosis, significant edema, Gastrointestinal (GI) soft, non-tender, non-distended, +BS. no ventral hernia noted. Musculoskeletal normal gait and posture. no significant deformity or arthritic changes, no loss or range of motion, no clubbing. Psychiatric this patient is able to make decisions and demonstrates good insight into disease process. Alert and Oriented x 3. pleasant and cooperative. Integumentary (Hair, Skin) Wound #1 status is Open. Original cause of wound was Trauma. The wound is located on the Left,Anterior Lower Leg. The wound measures 1.5cm length x 0.5cm width x 0.2cm depth; 0.589cm^2 area and 0.118cm^3 volume. There is Fat Layer (Subcutaneous Tissue) Exposed exposed. There is no tunneling or undermining noted. There is a large amount of serous drainage noted. The wound margin is flat and intact. There is no granulation within the wound bed. There is a large (67-100%) amount of necrotic tissue within the wound bed including Eschar and Adherent Slough. The periwound skin appearance exhibited: Erythema. The periwound skin appearance did not exhibit: Callus, Crepitus, Excoriation, Induration, Rash, Scarring, Dry/Scaly, Maceration, Atrophie Blanche, Cyanosis, Ecchymosis, Hemosiderin Staining, Mottled, Pallor, Rubor. The surrounding wound skin color is noted with erythema which is circumferential. Periwound temperature was noted as No Abnormality. The periwound  has tenderness on palpation. 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 Anesthetic: Wound #1 Left,Anterior Lower Leg: Topical Lidocaine 4% cream applied to wound bed prior to debridement Skin Barriers/Peri-Wound Care: Wound #1 Left,Anterior Lower Leg: Skin Prep Primary Wound Dressing: Wound #1 Left,Anterior Lower Leg: Santyl Ointment Secondary Dressing: Wound #1 Left,Anterior Lower Leg: Dry Gauze Boardered Foam Dressing Dressing Change Frequency: Wound #1 Left,Anterior Lower Leg: Change dressing every day. Follow-up Appointments: Wound #1  Left,Anterior Lower Leg: Return Appointment in 1 week. Edema Control: Wound #1 Left,Anterior Lower Leg: Elevate legs to the level of the heart and pump ankles as often as possible Additional Orders / Instructions: Wound #1 Left,Anterior Lower Leg: Increase protein intake. Other: - Please add vitamin A, vitamin C and zinc supplements to your diet Medications-please add to medication list.: LONNIE, RETH ([id number]) Wound #1 Left,Anterior Lower Leg: Santyl Enzymatic Ointment The following medication(s) was prescribed: Santyl topical 250 unit/gram ointment ointment topical apply nickel thick to the left lower leg wound daily then cover with dressing starting 03/18/2017 General Notes: At this point due to the fact that patient has slough covering his wound and is still scheduled for arterial studies but has not had them as of yet I'm going to recommend that we go ahead and start him on sample per the above wound care orders. No compression therapy will  be initiated although to be honest I do not feel is likely necessary just based on evaluation today. We will see were things stand in one weeks time and make adjustments depending on how his wound is progressing. Obviously I do think he could benefit from debridement but we will hold off until his arterial study which is actually scheduled for 10 April 2017. If anything worsens in the interim patient will contact the office for additional recommendations otherwise we will see him in one week. Electronic Signature(s) Signed: 03/18/2017 4:13:08 PM By: Worthy Keeler PA-C Entered By: Worthy Keeler on 03/18/2017 12:06:00 Edward Marquez ([id number]) -------------------------------------------------------------------------------- ROS/PFSH Details Patient Name: Edward Marquez Date of Service: 03/18/2017 8:00 AM Medical Record Number: [id number] Patient Account Number: [id number] Date of Birth/Sex: [birth year] (66 y.o. Male) Treating RN: Montey Hora Primary Care Provider: SYSTEM, PCP Other Clinician: Referring Provider: Referral, Self Treating Provider/Extender: STONE III, Gianni Mihalik Weeks in Treatment: 0 Information Obtained From Patient Wound History Do you currently have one or more open woundso Yes How many open wounds do you currently haveo 1 Approximately how long have you had your woundso 1 month How have you been treating your wound(s) until nowo mupirocin Has your wound(s) ever healed and then re-openedo No Have you had any lab work done in the past montho No Have you tested positive for an antibiotic resistant organism (MRSA, VRE)o No Have you tested positive for osteomyelitis (bone infection)o No Have you had any tests for circulation on your legso Yes Who ordered the testo PCP Where was the test doneo [facility] Constitutional Symptoms (General Health) Complaints and Symptoms: No Complaints or Symptoms Eyes Complaints and Symptoms: No Complaints or  Symptoms Ear/Nose/Mouth/Throat Complaints and Symptoms: No Complaints or Symptoms Hematologic/Lymphatic Complaints and Symptoms: No Complaints or Symptoms Respiratory Complaints and Symptoms: No Complaints or Symptoms Cardiovascular CHANNIN, AGUSTIN ([id number]) Medical History: Positive for: Arrhythmia - a fib; Hypertension Negative for: Angina Past Medical History Notes: aortic valve replacement Gastrointestinal Complaints and Symptoms: No Complaints or Symptoms Endocrine Complaints and Symptoms: No Complaints or Symptoms Genitourinary Complaints and Symptoms: No Complaints or Symptoms Immunological Complaints and Symptoms: No Complaints or Symptoms Integumentary (Skin) Complaints and Symptoms: No Complaints or Symptoms Musculoskeletal Complaints and Symptoms: No Complaints or Symptoms Medical History: Positive for: Gout Neurologic Complaints and Symptoms: No Complaints or Symptoms Oncologic Complaints and Symptoms: No Complaints or Symptoms Psychiatric ALFIE, RIDEAUX ([id number]) Complaints and Symptoms: No Complaints or Symptoms Immunizations Pneumococcal Vaccine: Received Pneumococcal Vaccination: Yes Immunization Notes: up to date Family and Social History Never smoker; Marital Status - Married; Alcohol Use: Daily; Drug Use: No  History; Caffeine Use: Daily; Financial Concerns: No; Food, Clothing or Shelter Needs: No; Support System Lacking: No; Transportation Concerns: No; Advanced Directives: No; Patient does not want information on Advanced Directives Electronic Signature(s) Signed: 03/18/2017 4:13:08 PM By: Worthy Keeler PA-C Signed: 03/18/2017 4:48:06 PM By: Montey Hora Entered By: Montey Hora on 03/18/2017 08:53:16 Edward Marquez ([id number]) -------------------------------------------------------------------------------- [employer] Details Patient Name: Edward Marquez Date of Service: 03/18/2017 Medical Record Number: [id number] Patient Account  Number: [id number] Date of Birth/Sex: 02-Jul-1951 (66 y.o. 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 Facility Procedures CPT4 Code: [id number] Description: 99214 - WOUND CARE VISIT-LEV 4 EST PT Modifier: Quantity: 1 Physician Procedures CPT4: Description Modifier Quantity Code [id number] - WC PHYS LEVEL 4 - NEW PT 1 ICD-10 Description Diagnosis S81.802A Unspecified open wound, left lower leg, initial encounter I73.9 Peripheral vascular disease, unspecified L97.822 Non-pressure  chronic ulcer of other part of left lower leg with fat layer exposed I48.2 Chronic atrial fibrillation Electronic Signature(s) Signed: 03/18/2017 4:13:08 PM By: Worthy Keeler PA-C Entered By: Worthy Keeler on 03/18/2017 12:06:24

## 2017-03-26 ENCOUNTER — Encounter: Payer: Medicare Other | Admitting: Physician Assistant

## 2017-03-26 DIAGNOSIS — L97822 Non-pressure chronic ulcer of other part of left lower leg with fat layer exposed: Secondary | ICD-10-CM | POA: Diagnosis not present

## 2017-03-26 DIAGNOSIS — S81802A Unspecified open wound, left lower leg, initial encounter: Secondary | ICD-10-CM | POA: Diagnosis not present

## 2017-03-26 DIAGNOSIS — M109 Gout, unspecified: Secondary | ICD-10-CM | POA: Diagnosis not present

## 2017-03-26 DIAGNOSIS — Z952 Presence of prosthetic heart valve: Secondary | ICD-10-CM | POA: Diagnosis not present

## 2017-03-26 DIAGNOSIS — I482 Chronic atrial fibrillation: Secondary | ICD-10-CM | POA: Diagnosis not present

## 2017-03-26 DIAGNOSIS — I739 Peripheral vascular disease, unspecified: Secondary | ICD-10-CM | POA: Diagnosis not present

## 2017-03-26 DIAGNOSIS — I10 Essential (primary) hypertension: Secondary | ICD-10-CM | POA: Diagnosis not present

## 2017-03-28 NOTE — Progress Notes (Signed)
Edward, Marquez ([id number]) Visit Report for 03/26/2017 Arrival Information Details Patient Name: Edward Marquez, Edward Marquez Date of Service: 03/26/2017 9:15 AM Medical Record Number: [id number] Patient Account Number: [id number] Date of Birth/Sex: 12-Dec-1950 (66 y.o. Male) Treating RN: Cornell Barman Primary Care Min Tunnell: SYSTEM, PCP Other Clinician: Referring Oris Calmes: Referral, Self Treating Kilani Joffe/Extender: STONE III, HOYT Weeks in Treatment: 1 Visit Information History Since Last Visit Added or deleted any medications: No Patient Arrived: Ambulatory Any new allergies or adverse reactions: No Arrival Time: 09:28 Had a fall or experienced change in No Accompanied By: self activities of daily living that may affect Transfer Assistance: None risk of falls: Patient Identification Verified: Yes Hospitalized since last visit: No Secondary Verification Process Yes Has Dressing in Place as Prescribed: Yes Completed: Pain Present Now: No Patient Has Alerts: Yes Patient Alerts: ABI Hadar BILATERAL >220 Electronic Signature(s) Signed: 03/26/2017 11:19:14 AM By: Gretta Cool, BSN, RN, CWS, Kim RN, BSN Entered By: Gretta Cool, BSN, RN, CWS, Kim on 03/26/2017 09:29:09 Edward Marquez ([id number]) -------------------------------------------------------------------------------- Clinic Level of Care Assessment Details Patient Name: Edward Marquez Date of Service: 03/26/2017 9:15 AM Medical Record Number: [id number] Patient Account Number: [id number] Date of Birth/Sex: November 03, 1950 (66 y.o. Male) Treating RN: Montey Hora Primary Care Siomara Burkel: SYSTEM, PCP Other Clinician: Cornell Barman Referring Nashea Chumney: Referral, Self Treating Patrcia Schnepp/Extender: Melburn Hake, HOYT Weeks in Treatment: 1 Clinic Level of Care Assessment Items TOOL 4 Quantity Score []  - Use when only an EandM is performed on FOLLOW-UP visit 0 ASSESSMENTS - Nursing Assessment / Reassessment X - Reassessment of Co-morbidities (includes updates in patient status)  1 10 X - Reassessment of Adherence to Treatment Plan 1 5 ASSESSMENTS - Wound and Skin Assessment / Reassessment X - Simple Wound Assessment / Reassessment - one wound 1 5 []  - Complex Wound Assessment / Reassessment - multiple wounds 0 []  - Dermatologic / Skin Assessment (not related to wound area) 0 ASSESSMENTS - Focused Assessment []  - Circumferential Edema Measurements - multi extremities 0 []  - Nutritional Assessment / Counseling / Intervention 0 X - Lower Extremity Assessment (monofilament, tuning fork, pulses) 1 5 X - Peripheral Arterial Disease Assessment (using hand held doppler) 1 10 ASSESSMENTS - Ostomy and/or Continence Assessment and Care []  - Incontinence Assessment and Management 0 []  - Ostomy Care Assessment and Management (repouching, etc.) 0 PROCESS - Coordination of Care X - Simple Patient / Family Education for ongoing care 1 15 []  - Complex (extensive) Patient / Family Education for ongoing care 0 []  - Staff obtains Programmer, systems, Records, Test Results / Process Orders 0 []  - Staff telephones HHA, Nursing Homes / Clarify orders / etc 0 []  - Routine Transfer to another Facility (non-emergent condition) 0 [place], Seibert ([id number]) []  - Routine Hospital Admission (non-emergent condition) 0 []  - New Admissions / Biomedical engineer / Ordering NPWT, Apligraf, etc. 0 []  - Emergency Hospital Admission (emergent condition) 0 X - Simple Discharge Coordination 1 10 []  - Complex (extensive) Discharge Coordination 0 PROCESS - Special Needs []  - Pediatric / Minor Patient Management 0 []  - Isolation Patient Management 0 []  - Hearing / Language / Visual special needs 0 []  - Assessment of Community assistance (transportation, D/C planning, etc.) 0 []  - Additional assistance / Altered mentation 0 []  - Support Surface(s) Assessment (bed, cushion, seat, etc.) 0 INTERVENTIONS - Wound Cleansing / Measurement X - Simple Wound Cleansing - one wound 1 5 []  - Complex Wound Cleansing -  multiple wounds 0 X - Wound Imaging (photographs - any number of wounds) 1 5 []  -  Wound Tracing (instead of photographs) 0 X - Simple Wound Measurement - one wound 1 5 []  - Complex Wound Measurement - multiple wounds 0 INTERVENTIONS - Wound Dressings X - Small Wound Dressing one or multiple wounds 1 10 []  - Medium Wound Dressing one or multiple wounds 0 []  - Large Wound Dressing one or multiple wounds 0 X - Application of Medications - topical 1 5 []  - Application of Medications - injection 0 INTERVENTIONS - Miscellaneous []  - External ear exam 0 [place], Jazz ([id number]) []  - Specimen Collection (cultures, biopsies, blood, body fluids, etc.) 0 []  - Specimen(s) / Culture(s) sent or taken to Lab for analysis 0 []  - Patient Transfer (multiple staff / Harrel Lemon Lift / Similar devices) 0 []  - Simple Staple / Suture removal (25 or less) 0 []  - Complex Staple / Suture removal (26 or more) 0 []  - Hypo / Hyperglycemic Management (close monitor of Blood Glucose) 0 []  - Ankle / Brachial Index (ABI) - do not check if billed separately 0 X - Vital Signs 1 5 Has the patient been seen at the hospital within the last three years: Yes Total Score: 95 Level Of Care: New/Established - Level 3 Electronic Signature(s) Signed: 03/26/2017 4:51:44 PM By: Montey Hora Entered By: Montey Hora on 03/26/2017 12:58:00 Edward Marquez ([id number]) -------------------------------------------------------------------------------- Encounter Discharge Information Details Patient Name: Edward Marquez Date of Service: 03/26/2017 9:15 AM Medical Record Number: [id number] Patient Account Number: [id number] Date of Birth/Sex: 12-18-1950 (66 y.o. Male) Treating RN: Montey Hora Primary Care Swathi Dauphin: SYSTEM, PCP Other Clinician: Cornell Barman Referring Yuki Brunsman: Referral, Self Treating Kamrin Sibley/Extender: Melburn Hake, HOYT Weeks in Treatment: 1 Encounter Discharge Information Items Discharge Pain Level: 0 Discharge  Condition: Stable Ambulatory Status: Ambulatory Discharge Destination: Home Transportation: Private Auto Accompanied By: self Schedule Follow-up Appointment: Yes Medication Reconciliation completed and provided to Patient/Care No Eulice Rutledge: Provided on Clinical Summary of Care: 03/26/2017 Form Type Recipient Paper Patient BW Electronic Signature(s) Signed: 03/26/2017 10:19:31 AM By: Ruthine Dose Entered By: Ruthine Dose on 03/26/2017 10:19:31 Edward Marquez ([id number]) -------------------------------------------------------------------------------- Lower Extremity Assessment Details Patient Name: Edward Marquez Date of Service: 03/26/2017 9:15 AM Medical Record Number: [id number] Patient Account Number: [id number] Date of Birth/Sex: 09-20-1950 (66 y.o. Male) Treating RN: Cornell Barman Primary Care Takita Riecke: SYSTEM, PCP Other Clinician: Referring Imajean Mcdermid: Referral, Self Treating Ketan Renz/Extender: STONE III, HOYT Weeks in Treatment: 1 Vascular Assessment Pulses: Dorsalis Pedis Palpable: [Left:No] Doppler Audible: [Left:Yes] Posterior Tibial Palpable: [Left:No] Doppler Audible: [Left:Yes] Extremity colors, hair growth, and conditions: Extremity Color: [Left:Red] Hair Growth on Extremity: [Left:Yes] Temperature of Extremity: [Left:Warm] Capillary Refill: [Left:> 3 seconds] Dependent Rubor: [Left:No] Blanched when Elevated: [Left:No] Lipodermatosclerosis: [Left:No] Toe Nail Assessment Left: Right: Thick: No Discolored: No Deformed: No Improper Length and Hygiene: No Electronic Signature(s) Signed: 03/26/2017 11:19:14 AM By: Gretta Cool, BSN, RN, CWS, Kim RN, BSN Entered By: Gretta Cool, BSN, RN, CWS, Kim on 03/26/2017 09:38:25 Edward Marquez ([id number]) -------------------------------------------------------------------------------- Multi Wound Chart Details Patient Name: Edward Marquez Date of Service: 03/26/2017 9:15 AM Medical Record Number: [id number] Patient Account Number:  [id number] Date of Birth/Sex: 12-Jul-1951 (66 y.o. Male) Treating RN: Montey Hora Primary Care Sharyon Peitz: SYSTEM, PCP Other Clinician: Cornell Barman Referring Kurtiss Wence: Referral, Self Treating Katey Barrie/Extender: STONE III, HOYT Weeks in Treatment: 1 Vital Signs Height(in): 70 Pulse(bpm): 66 Weight(lbs): 180 Blood Pressure 156/75 (mmHg): Body Mass Index(BMI): 26 Temperature(F): 97.8 Respiratory Rate 16 (breaths/min): Photos: [1:No Photos] [N/A:N/A] Wound Location: [1:Left Lower Leg - Anterior N/A] Wounding Event: [1:Trauma] [N/A:N/A] Primary Etiology: [1:Trauma, Other] [N/A:N/A] Comorbid History: [1:Arrhythmia, Hypertension, N/A  Gout] Date Acquired: [1:02/15/2017] [N/A:N/A] Weeks of Treatment: [1:1] [N/A:N/A] Wound Status: [1:Open] [N/A:N/A] Measurements L x W x D 1.5x0.5x0.2 [N/A:N/A] (cm) Area (cm) : [1:0.589] [N/A:N/A] Volume (cm) : [1:0.118] [N/A:N/A] % Reduction in Area: [1:0.00%] [N/A:N/A] % Reduction in Volume: 0.00% [N/A:N/A] Classification: [1:Full Thickness With Exposed Support Structures] [N/A:N/A] Exudate Amount: [1:Large] [N/A:N/A] Exudate Type: [1:Serous] [N/A:N/A] Exudate Color: [1:amber] [N/A:N/A] Wound Margin: [1:Epibole] [N/A:N/A] Granulation Amount: [1:None Present (0%)] [N/A:N/A] Necrotic Amount: [1:Large (67-100%)] [N/A:N/A] Necrotic Tissue: [1:Eschar, Adherent Slough N/A] Exposed Structures: [1:Fat Layer (Subcutaneous N/A Tissue) Exposed: Yes Fascia: No Tendon: No Muscle: No] Joint: No Bone: No Epithelialization: None N/A N/A Periwound Skin Texture: Excoriation: No N/A N/A Induration: No Callus: No Crepitus: No Rash: No Scarring: No Periwound Skin Maceration: No N/A N/A Moisture: Dry/Scaly: No Periwound Skin Color: Erythema: Yes N/A N/A Atrophie Blanche: No Cyanosis: No Ecchymosis: No Hemosiderin Staining: No Mottled: No Pallor: No Rubor: No Erythema Location: Circumferential N/A N/A Temperature: No Abnormality N/A N/A Tenderness on  Yes N/A N/A Palpation: Wound Preparation: Ulcer Cleansing: N/A N/A Rinsed/Irrigated with Saline Topical Anesthetic Applied: Other: lidocaine 4% Treatment Notes Electronic Signature(s) Signed: 03/26/2017 4:51:44 PM By: Montey Hora Entered By: Montey Hora on 03/26/2017 10:05:19 Edward Marquez ([id number]) -------------------------------------------------------------------------------- [place] Details Patient Name: Edward Marquez Date of Service: 03/26/2017 9:15 AM Medical Record Number: [id number] Patient Account Number: [id number] Date of Birth/Sex: 1951/01/04 (66 y.o. Male) Treating RN: Montey Hora Primary Care Tashaya Ancrum: SYSTEM, PCP Other Clinician: Cornell Barman Referring Asal Teas: Referral, Self Treating Shaynna Husby/Extender: Melburn Hake, HOYT Weeks in Treatment: 1 Active Inactive ` Orientation to the Wound Care Program Nursing Diagnoses: Knowledge deficit related to the wound healing center program Goals: Patient/caregiver will verbalize understanding of the [place] Program Date Initiated: 03/18/2017 Target Resolution Date: 05/24/2017 Goal Status: Active Interventions: Provide education on orientation to the wound center Notes: ` Wound/Skin Impairment Nursing Diagnoses: Impaired tissue integrity Goals: Ulcer/skin breakdown will have a volume reduction of 30% by week 4 Date Initiated: 03/18/2017 Target Resolution Date: 05/24/2017 Goal Status: Active Ulcer/skin breakdown will have a volume reduction of 50% by week 8 Date Initiated: 03/18/2017 Target Resolution Date: 05/24/2017 Goal Status: Active Ulcer/skin breakdown will have a volume reduction of 80% by week 12 Date Initiated: 03/18/2017 Target Resolution Date: 05/24/2017 Goal Status: Active Ulcer/skin breakdown will heal within 14 weeks Date Initiated: 03/18/2017 Target Resolution Date: 05/24/2017 Goal Status: Active Interventions: SHAYMUS, EVELETH ([id number]) Assess patient/caregiver  ability to obtain necessary supplies Assess patient/caregiver ability to perform ulcer/skin care regimen upon admission and as needed Assess ulceration(s) every visit Notes: Electronic Signature(s) Signed: 03/26/2017 4:51:44 PM By: Montey Hora Entered By: Montey Hora on 03/26/2017 10:04:53 Edward Marquez ([id number]) -------------------------------------------------------------------------------- Pain Assessment Details Patient Name: Edward Marquez Date of Service: 03/26/2017 9:15 AM Medical Record Number: [id number] Patient Account Number: [id number] Date of Birth/Sex: [birth year] (66 y.o. Male) Treating RN: Cornell Barman Primary Care Taj Arteaga: SYSTEM, PCP Other Clinician: Referring Awab Abebe: Referral, Self Treating Lyndsie Wallman/Extender: STONE III, HOYT Weeks in Treatment: 1 Active Problems Location of Pain Severity and Description of Pain Patient Has Paino No Site Locations With Dressing Change: No Pain Management and Medication Current Pain Management: Goals for Pain Management Topical or injectable lidocaine is offered to patient for acute pain when surgical debridement is performed. If needed, Patient is instructed to use over the counter pain medication for the following 24-48 hours after debridement. Wound care MDs do not prescribed pain medications. Patient has chronic pain or uncontrolled pain. Patient has been instructed to make an  appointment with their Primary Care Physician for pain management. Electronic Signature(s) Signed: 03/26/2017 11:19:14 AM By: Gretta Cool, BSN, RN, CWS, Kim RN, BSN Entered By: Gretta Cool, BSN, RN, CWS, Kim on 03/26/2017 09:29:26 Edward Marquez ([id number]) -------------------------------------------------------------------------------- Patient/Caregiver Education Details Patient Name: Edward Marquez Date of Service: 03/26/2017 9:15 AM Medical Record Number: [id number] Patient Account Number: [id number] Date of Birth/Gender: 23-Dec-1950 (66 y.o. Male) Treating  RN: Montey Hora Primary Care Physician: SYSTEM, PCP Other Clinician: Cornell Barman Referring Physician: Referral, Self Treating Physician/Extender: Sharalyn Ink in Treatment: 1 Education Assessment Education Provided To: Patient Education Topics Provided Wound/Skin Impairment: Handouts: Other: wound care as ordered Methods: Demonstration, Explain/Verbal Responses: State content correctly Electronic Signature(s) Signed: 03/26/2017 4:51:44 PM By: Montey Hora Entered By: Montey Hora on 03/26/2017 12:58:52 Edward Marquez ([id number]) -------------------------------------------------------------------------------- Wound Assessment Details Patient Name: Edward Marquez Date of Service: 03/26/2017 9:15 AM Medical Record Number: [id number] Patient Account Number: [id number] Date of Birth/Sex: February 14, 1951 (66 y.o. Male) Treating RN: Cornell Barman Primary Care Aryn Kops: SYSTEM, PCP Other Clinician: Referring Janie Capp: Referral, Self Treating Crystalee Ventress/Extender: STONE III, HOYT Weeks in Treatment: 1 Wound Status Wound Number: 1 Primary Etiology: Trauma, Other Wound Location: Left Lower Leg - Anterior Wound Status: Open Wounding Event: Trauma Comorbid History: Arrhythmia, Hypertension, Gout Date Acquired: 02/15/2017 Weeks Of Treatment: 1 Clustered Wound: No Photos Photo Uploaded By: Montey Hora on 03/26/2017 12:51:31 Wound Measurements Length: (cm) 1.5 Width: (cm) 0.5 Depth: (cm) 0.2 Area: (cm) 0.589 Volume: (cm) 0.118 % Reduction in Area: 0% % Reduction in Volume: 0% Epithelialization: None Tunneling: No Undermining: No Wound Description Full Thickness With Exposed Classification: Support Structures Wound Margin: Epibole Exudate Large Amount: Exudate Type: Serous Exudate Color: amber Foul Odor After Cleansing: No Slough/Fibrino Yes Wound Bed Granulation Amount: None Present (0%) Exposed Structure Necrotic Amount: Large (67-100%) Fascia Exposed:  No Necrotic Quality: Eschar, Adherent Slough Fat Layer (Subcutaneous Tissue) Exposed: Yes Legan, Jaylenn ([id number]) Tendon Exposed: No Muscle Exposed: No Joint Exposed: No Bone Exposed: No Periwound Skin Texture Texture Color No Abnormalities Noted: No No Abnormalities Noted: No Callus: No Atrophie Blanche: No Crepitus: No Cyanosis: No Excoriation: No Ecchymosis: No Induration: No Erythema: Yes Rash: No Erythema Location: Circumferential Scarring: No Hemosiderin Staining: No Mottled: No Moisture Pallor: No No Abnormalities Noted: No Rubor: No Dry / Scaly: No Maceration: No Temperature / Pain Temperature: No Abnormality Tenderness on Palpation: Yes Wound Preparation Ulcer Cleansing: Rinsed/Irrigated with Saline Topical Anesthetic Applied: Other: lidocaine 4%, Treatment Notes Wound #1 (Left, Anterior Lower Leg) 1. Cleansed with: Clean wound with Normal Saline 2. Anesthetic Topical Lidocaine 4% cream to wound bed prior to debridement 4. Dressing Applied: Santyl Ointment 5. Secondary Dressing Applied Dry [place] Signature(s) Signed: 03/26/2017 11:19:14 AM By: Gretta Cool, BSN, RN, CWS, Kim RN, BSN Entered By: Gretta Cool, BSN, RN, CWS, Kim on 03/26/2017 09:33:29 Edward Marquez ([id number]) -------------------------------------------------------------------------------- [place] Details Patient Name: Edward Marquez Date of Service: 03/26/2017 9:15 AM Medical Record Number: [id number] Patient Account Number: [id number] Date of Birth/Sex: 1950-09-06 (66 y.o. Male) Treating RN: Cornell Barman Primary Care Yentl Verge: SYSTEM, PCP Other Clinician: Referring Kiyomi Pallo: Referral, Self Treating Chade Pitner/Extender: STONE III, HOYT Weeks in Treatment: 1 Vital Signs Time Taken: 09:29 Temperature (F): 97.8 Height (in): 70 Pulse (bpm): 66 Weight (lbs): 180 Respiratory Rate (breaths/min): 16 Body Mass Index (BMI): 25.8 Blood Pressure (mmHg): 156/75 Reference Range:  80 - 120 mg / dl Electronic Signature(s) Signed: 03/26/2017 11:19:14 AM By: Gretta Cool, BSN, RN, CWS, Kim RN, BSN Entered By: Gretta Cool, BSN, RN, CWS, Kim on 03/26/2017  09:30:28 

## 2017-03-28 NOTE — Progress Notes (Signed)
GRIGOR, LIPSCHUTZ ([id number]) Visit Report for 03/26/2017 Chief Complaint Document Details Patient Name: Edward Marquez, Edward Marquez Date of Service: 03/26/2017 9:15 AM Medical Record Number: [id number] Patient Account Number: [id number] Date of Birth/Sex: 03-13-1951 (66 y.o. Male) Treating RN: Montey Hora Primary Care Provider: SYSTEM, PCP Other Clinician: Cornell Barman Referring Provider: Referral, Self Treating Provider/Extender: Worthy Keeler Weeks in Treatment: 1 Information Obtained from: Patient Chief Complaint Left Anterior LE Ulcer Electronic Signature(s) Signed: 03/27/2017 1:25:22 AM By: Worthy Keeler PA-C Entered By: Worthy Keeler on 03/26/2017 10:26:37 Edward Marquez ([id number]) -------------------------------------------------------------------------------- HPI Details Patient Name: Edward Marquez Date of Service: 03/26/2017 9:15 AM Medical Record Number: [id number] Patient Account Number: [id number] Date of Birth/Sex: 05/07/1951 (66 y.o. Male) Treating RN: Montey Hora Primary Care Provider: SYSTEM, PCP Other Clinician: Cornell Barman Referring Provider: Referral, Self Treating Provider/Extender: Melburn Hake, Jaxson Keener Weeks in Treatment: 1 History of Present Illness HPI Description: 03/18/17 patient presents today for initial evaluation concerning an injury which occurred roughly 2 months ago when he was using a tiller at his home. He tells me that he was carrying this when it cut his left anterior shin. He did not go see anyone immediately as he felt that it would just heal on its own but obviously he tells me that's not the case. This ended up red and inflamed surrounding and he was diagnosed with cellulitis at the urgent care when he was seen on 02/19/17. He was started on Augmentin at that time and then had a follow-up wound care check on 03/11/17 at urgent care as well. Fortunately there is no evidence of infection at that point the Bactroban was prescribed for him at that time. He has been  using that sense. Patient is unaware of the fact that he had issues with blood flow in his lower extremities bilaterally until all this happened and they began evaluating him at urgent care. Subsequently he has been scheduled for an arterial study with ABI and TBI on 03/15/17 and this will be scheduled for 04/10/17. 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 He states that he has discomfort about one out of 10 at rest described as a sharp/burning sensation that is worsened to four out of 10 with cleansing the wound. Other than those medical conditions patient appears to be in good health and remains physically active even at 66 years of age. 03/26/17 on evaluation today patient appears to be doing fairly well in regard to his left lower extremity wound. The overall appearance is greatly improved although his measurements really have not shifted at all. He does have an appointment for a vascular evaluation due to his diminished blood flow in the bilateral lower extremities with associated history of potential vascular claudication. This appointment is scheduled for April 10, 2017 in [place]. Otherwise patient has no significant pain and states that he asked he feels like his "neuropathy" is doing better. Electronic Signature(s) Signed: 03/27/2017 1:25:22 AM By: Worthy Keeler PA-C Entered By: Worthy Keeler on 03/26/2017 10:26:51 Edward Marquez ([id number]) -------------------------------------------------------------------------------- Physical Exam Details Patient Name: Edward Marquez Date of Service: 03/26/2017 9:15 AM Medical Record Number: [id number] Patient Account Number: [id number] Date of Birth/Sex: [birth year] (66 y.o. Male) Treating RN: Montey Hora Primary Care  Provider: SYSTEM, PCP Other Clinician: Cornell Barman Referring Provider: Referral,  Self Treating Provider/Extender: STONE III, Vance Belcourt Weeks in Treatment: 1 Constitutional Well-nourished and well-hydrated in no acute distress. Respiratory normal breathing without difficulty. clear to auscultation bilaterally. Cardiovascular regular rate and rhythm with normal S1, S2. Psychiatric this patient is able to make decisions and demonstrates good insight into disease process. Alert and Oriented x 3. pleasant and cooperative. Notes Patient's wound shows no evidence of erythema surrounding compared to last week's evaluation this is greatly improved. Overall he has been tolerating the dressing without complication. No sharp debridement performed due to pending vascular evaluation. Electronic Signature(s) Signed: 03/27/2017 1:25:22 AM By: Worthy Keeler PA-C Entered By: Worthy Keeler on 03/26/2017 10:27:21 Edward Marquez ([id number]) -------------------------------------------------------------------------------- Physician Orders Details Patient Name: Edward Marquez Date of Service: 03/26/2017 9:15 AM Medical Record Number: [id number] Patient Account Number: [id number] Date of Birth/Sex: 1951-07-17 (66 y.o. 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 Anesthetic Wound #1 Left,Anterior Lower Leg o Topical  Lidocaine 4% cream applied to wound bed prior to debridement Skin Barriers/Peri-Wound Care Wound #1 Left,Anterior Lower Leg o Skin Prep Primary Wound Dressing Wound #1 Left,Anterior Lower Leg o Santyl Ointment Secondary Dressing Wound #1 Left,Anterior Lower Leg o Dry Gauze o Boardered Foam Dressing Dressing Change Frequency Wound #1 Left,Anterior Lower Leg o Change dressing every day. Follow-up Appointments Edward Marquez, Edward Marquez ([id number]) Wound #1 Left,Anterior Lower Leg o Return Appointment in 1 week. Edema Control Wound #1 Left,Anterior Lower Leg o Elevate legs to the level of the heart and pump ankles as often as possible Additional Orders / Instructions Wound #1 Left,Anterior Lower Leg o Increase protein intake. o Other: - Please add vitamin A, vitamin C and zinc supplements to your diet Medications-please add to medication list. Wound #1 Left,Anterior Lower Leg o Santyl Enzymatic Ointment Electronic Signature(s) Signed: 03/26/2017 4:51:44 PM By: Montey Hora Signed: 03/27/2017 1:25:22 AM By: Worthy Keeler PA-C Entered By: Montey Hora on 03/26/2017 10:10:13 Edward Marquez ([id number]) -------------------------------------------------------------------------------- Problem List Details Patient Name: Edward Marquez Date of Service: 03/26/2017 9:15 AM Medical Record Number: [id number] Patient Account Number: [id number] Date of Birth/Sex: 04/04/1951 (66 y.o. 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 Male) Treating RN: Montey Hora Primary Care Provider: SYSTEM, PCP Other Clinician: Cornell Barman Referring Provider: Referral, Self Treating Provider/Extender:  STONE III, Anique Beckley Weeks in Treatment: 1 Subjective Chief Complaint Information obtained from Patient Left Anterior LE Ulcer History of Present Illness (HPI) 03/18/17 patient presents today for initial evaluation concerning an injury which occurred roughly 2 months ago when he was using a tiller at his home. He tells me that he was carrying this when it cut his left anterior shin. He did not go see anyone immediately as he felt that it would just heal on its own but obviously he tells me that's not the case. This ended up red and inflamed surrounding and he was diagnosed with cellulitis at the urgent care when he was seen on 02/19/17. He was started on Augmentin at that time and then had a follow-up wound care check on 03/11/17 at urgent care as well. Fortunately there is no evidence of infection at that point the Bactroban was prescribed for him at that time. He has been using that sense. Patient is unaware of the fact that he had issues with blood flow in his lower extremities bilaterally until all this happened and they began evaluating him at urgent care. Subsequently he has been scheduled for an arterial study with ABI and TBI on 03/15/17 and this will be  scheduled for 04/10/17. 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 He states that he has discomfort about one out of 10 at rest described as a sharp/burning sensation that is worsened to four out of 10 with cleansing the wound. Other than those medical conditions patient appears to be in good health and remains physically active even at 66 years of age. 03/26/17 on evaluation today patient appears to be doing fairly well in regard to his left lower extremity wound. The overall appearance is greatly improved although his measurements really have not shifted at all. He does have an appointment for a vascular evaluation due to his diminished blood flow in the bilateral lower extremities with associated history of potential vascular claudication. This appointment is scheduled for April 10, 2017 in [place]. Otherwise patient has no significant pain and states that he asked he feels like his "neuropathy" is doing better. Objective Edward Marquez, Edward Marquez ([id number]) Constitutional Well-nourished and well-hydrated in no acute distress. Vitals Time Taken: 9:29 AM, Height: 70 in, Weight: 180 lbs, BMI: 25.8, Temperature: 97.8 F, Pulse: 66 bpm, Respiratory Rate: 16 breaths/min, Blood Pressure: 156/75 mmHg. Respiratory normal breathing without difficulty. clear to auscultation bilaterally. Cardiovascular regular rate and rhythm with normal S1, S2. Psychiatric this patient is able to make decisions and demonstrates good insight into disease process. Alert and Oriented x 3. pleasant and cooperative. General Notes: Patient's wound shows no evidence of erythema surrounding compared to last week's evaluation this is greatly improved. Overall he has been tolerating the dressing without  complication. No sharp debridement performed due to pending vascular evaluation. Integumentary (Hair, Skin) Wound #1 status is Open. Original cause of wound was Trauma. The wound is located on the Left,Anterior Lower Leg. The wound measures 1.5cm length x 0.5cm width x 0.2cm depth; 0.589cm^2 area and 0.118cm^3 volume. There is Fat Layer (Subcutaneous Tissue) Exposed exposed. There is no tunneling or undermining noted. There is a large amount of serous drainage noted. The wound margin is epibole. There is no granulation within the wound bed. There is a large (67-100%)  amount of necrotic tissue within the wound bed including Eschar and Adherent Slough. The periwound skin appearance exhibited: Erythema. The periwound skin appearance did not exhibit: Callus, Crepitus, Excoriation, Induration, Rash, Scarring, Dry/Scaly, Maceration, Atrophie Blanche, Cyanosis, Ecchymosis, Hemosiderin Staining, Mottled, Pallor, Rubor. The surrounding wound skin color is noted with erythema which is circumferential. Periwound temperature was noted as No Abnormality. The periwound has tenderness on palpation. 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 Anesthetic: Wound #1 Left,Anterior Lower Leg: Topical Lidocaine 4% cream applied to wound bed prior to debridement Skin Barriers/Peri-Wound Care: Wound #1 Left,Anterior Lower Leg: Skin Prep Primary Wound Dressing: Wound #1 Left,Anterior Lower Leg: Santyl Ointment Secondary Dressing: Wound #1 Left,Anterior Lower Leg: Dry Gauze Boardered Foam Dressing Dressing Change  Frequency: Wound #1 Left,Anterior Lower Leg: Change dressing every day. Follow-up Appointments: Wound #1 Left,Anterior Lower Leg: Return Appointment in 1 week. Edema Control: Wound #1 Left,Anterior Lower Leg: Elevate legs to the level of the heart and pump ankles as often as possible Additional Orders / Instructions: Wound #1 Left,Anterior Lower Leg: Increase protein intake. Other: - Please add vitamin A, vitamin C and zinc supplements to your diet Medications-please add to medication list.: Wound #1 Left,Anterior Lower Leg: Santyl Enzymatic Ointment We will continue with the Current wound care measures per above for the next week. If anything worsens in the interim patient will contact our office for additional recommendations otherwise will see him for reevaluation at one weeks time. Electronic Signature(s) Edward Marquez, Edward Marquez ([id number]) Signed: 03/27/2017 1:25:22 AM By: Worthy Keeler PA-C Entered By: Worthy Keeler on 03/26/2017 10:27:47 Edward Marquez ([id number]) -------------------------------------------------------------------------------- SuperBill Details Patient Name: Edward Marquez Date of Service: 03/26/2017 Medical Record Number: [id number] Patient Account Number: [id number] Date of Birth/Sex: [birth year] (66 y.o. 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 Facility Procedures CPT4 Code: [id number] Description: 99213 - WOUND CARE VISIT-LEV 3 EST PT Modifier: Quantity: 1 Physician  Procedures CPT4: Description Modifier Quantity Code [id number] 99213 - WC PHYS LEVEL 3 - EST PT 1 ICD-10 Description Diagnosis S81.802A Unspecified open wound, left lower leg, initial encounter I73.9 Peripheral vascular disease, unspecified L97.822 Non-pressure  chronic ulcer of other part of left lower leg with fat layer exposed I48.2 Chronic atrial fibrillation Electronic Signature(s) Signed: 03/26/2017 12:58:08 PM By: Montey Hora Signed: 03/27/2017 1:25:22 AM By: Worthy Keeler PA-C Entered By: Montey Hora on 03/26/2017 12:58:08

## 2017-03-29 ENCOUNTER — Encounter: Payer: Self-pay | Admitting: Vascular Surgery

## 2017-04-02 ENCOUNTER — Encounter: Payer: Medicare Other | Admitting: Internal Medicine

## 2017-04-02 DIAGNOSIS — M109 Gout, unspecified: Secondary | ICD-10-CM | POA: Diagnosis not present

## 2017-04-02 DIAGNOSIS — L97822 Non-pressure chronic ulcer of other part of left lower leg with fat layer exposed: Secondary | ICD-10-CM | POA: Diagnosis not present

## 2017-04-02 DIAGNOSIS — I10 Essential (primary) hypertension: Secondary | ICD-10-CM | POA: Diagnosis not present

## 2017-04-02 DIAGNOSIS — S81802A Unspecified open wound, left lower leg, initial encounter: Secondary | ICD-10-CM | POA: Diagnosis not present

## 2017-04-02 DIAGNOSIS — I482 Chronic atrial fibrillation: Secondary | ICD-10-CM | POA: Diagnosis not present

## 2017-04-02 DIAGNOSIS — I739 Peripheral vascular disease, unspecified: Secondary | ICD-10-CM | POA: Diagnosis not present

## 2017-04-02 DIAGNOSIS — Z952 Presence of prosthetic heart valve: Secondary | ICD-10-CM | POA: Diagnosis not present

## 2017-04-03 NOTE — Progress Notes (Signed)
KETAN, RENZ ([id number]) Visit Report for 04/02/2017 HPI Details Patient Name: Edward Marquez, Edward Marquez Date of Service: 04/02/2017 8:45 AM Medical Record Number: [id number] Patient Account Number: [id number] Date of Birth/Sex: [birth year] (66 y.o. Male) Treating RN: Ahmed Prima Primary Care Provider: Vic Blackbird Other Clinician: Referring Provider: Referral, Self Treating Provider/Extender: Ricard Dillon Weeks in Treatment: 2 History of Present Illness HPI Description: 03/18/17 patient presents today for initial evaluation concerning an injury which occurred roughly 2 months ago when he was using a tiller at his home. He tells me that he was carrying this when it cut his left anterior shin. He did not go see anyone immediately as he felt that it would just heal on its own but obviously he tells me that's not the case. This ended up red and inflamed surrounding and he was diagnosed with cellulitis at the urgent care when he was seen on 02/19/17. He was started on Augmentin at that time and then had a follow-up wound care check on 03/11/17 at urgent care as well. Fortunately there is no evidence of infection at that point the Bactroban was prescribed for him at that time. He has been using that sense. Patient is unaware of the fact that he had issues with blood flow in his lower extremities bilaterally until all this happened and they began evaluating him at urgent care. Subsequently he has been scheduled for an arterial study with ABI and TBI on 03/15/17 and this will be scheduled for 04/10/17. 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 He states that he has discomfort about one out of 10 at rest described as a sharp/burning sensation that is worsened to four out of 10 with cleansing the wound.  Other than those medical conditions patient appears to be in good health and remains physically active even at 66 years of age. 03/26/17 on evaluation today patient appears to be doing fairly well in regard to his left lower extremity wound. The overall appearance is greatly improved although his measurements really have not shifted at all. He does have an appointment for a vascular evaluation due to his diminished blood flow in the bilateral lower extremities with associated history of potential vascular claudication. This appointment is scheduled for April 10, 2017 in [place]. Otherwise patient has no significant pain and states that he asked he feels like his "neuropathy" is doing better. 04/02/17; traumatic wound on his left anterior leg about 2-1/2 months ago which has not really made any substantial progression towards healing. The patient has been using Santyl. He has had a venous ultrasound that was negative for DVT. He has arterial studies and apparently a vascular surgeon consultation booked for 04/10/17. The patient has a St. Jude's aortic valve and is on Coumadin. He complains of discomfort predominantly in the plantar aspect of his left foot and lateral 4 toes. This is worse at night and may actually reflect plantar fasciitis. But with ongoing questioning I'm quite convinced that he has claudication approximately 100 yards when walking to his barn from his house aching discomfort in both legs Electronic Signature(s) Signed: 04/02/2017 5:34:31 PM By: Linton Ham MD [place], [place] ([id number]) Entered By: Linton Ham on 04/02/2017 09:58:34 Edward Marquez ([id number]) -------------------------------------------------------------------------------- Physical Exam Details Patient Name: Edward Marquez Date of Service: 04/02/2017 8:45 AM Medical Record  Number: [id number] Patient Account Number: [id number] Date of Birth/Sex: 1951/01/16 (66 y.o. Male) Treating RN:  Ahmed Prima Primary Care Provider: Vic Blackbird Other Clinician: Referring Provider: Referral, Self Treating Provider/Extender: Ricard Dillon Weeks in Treatment: 2 Constitutional Patient is hypertensive.. Pulse regular and within target range for patient.Marland Kitchen Respirations regular, non-labored and within target range.. Temperature is normal and within the target range for the patient.Marland Kitchen appears in no distress. Respiratory Respiratory effort is easy and symmetric bilaterally. Rate is normal at rest and on room air.. Cardiovascular Mechanical second sound no murmurs. Femoral arteries without bruits and pulses strong.. Pedal pulses palpable and strong bilaterally.. Edema present in both extremities. No edema. Lymphatic None palpable in the left popliteal or inguinal area. Musculoskeletal Is no tenderness over the metatarsal heads. Neurological Normal to the microfilament test in his dorsal foot although he seems to have some loss in the plantar lateral foot and plantar toes. Psychiatric No evidence of depression, anxiety, or agitation. Calm, cooperative, and communicative. Appropriate interactions and affect.. Notes Wound exam; small oval-shaped wound on his left anterior pretibial area. Base of it has some surface slough but did not require debridement. There is no evidence of surrounding infection. I could not feel pedal pulses on him nor femoral pulses. Papillary refill time is delayed Electronic Signature(s) Signed: 04/02/2017 5:34:31 PM By: Linton Ham MD Entered By: Linton Ham on 04/02/2017 10:00:36 Edward Marquez ([id number]) -------------------------------------------------------------------------------- Physician Orders Details Patient Name: Edward Marquez Date of Service: 04/02/2017 8:45 AM Medical Record Number: [id number] Patient Account Number: [id number] Date of Birth/Sex: 10/18/1950 (66 y.o. Male) Treating RN: Ahmed Prima Primary Care Provider: Vic Blackbird Other Clinician: Referring Provider: Referral, Self Treating Provider/Extender: Tito Dine in Treatment: 2 Verbal / Phone Orders: Yes ClinicianCarolyne Fiscal, Debi Read Back and Verified: Yes Diagnosis Coding Wound Cleansing Wound #1 Left,Anterior Lower Leg o Clean wound with Normal Saline. Anesthetic Wound #1 Left,Anterior Lower Leg o Topical Lidocaine 4% cream applied to wound bed prior to debridement Skin Barriers/Peri-Wound Care Wound #1 Left,Anterior Lower Leg o Skin Prep Primary Wound Dressing Wound #1 Left,Anterior Lower Leg o Santyl Ointment Secondary Dressing Wound #1 Left,Anterior Lower Leg o Dry Gauze o Boardered Foam Dressing Dressing Change Frequency Wound #1 Left,Anterior Lower Leg o Change dressing every day. Follow-up Appointments Wound #1 Left,Anterior Lower Leg o Return Appointment in 1 week. Edema Control Wound #1 Left,Anterior Lower Leg o Elevate legs to the level of the heart and pump ankles as often as possible Additional Orders / Instructions Edward Marquez, Edward Marquez ([id number]) Wound #1 Left,Anterior Lower Leg o Increase protein intake. o Other: - Please add vitamin A, vitamin C and zinc supplements to your diet Medications-please add to medication list. Wound #1 Left,Anterior Lower Leg o Santyl Enzymatic Ointment Electronic Signature(s) Signed: 04/02/2017 4:15:31 PM By: Alric Quan Signed: 04/02/2017 5:34:31 PM By: Linton Ham MD Entered By: Alric Quan on 04/02/2017 09:11:23 Edward Marquez ([id number]) -------------------------------------------------------------------------------- Problem List Details Patient Name: Edward Marquez Date of Service: 04/02/2017 8:45 AM Medical Record Number: [id number] Patient Account Number: [id number] Date of Birth/Sex: 02-03-1951 (66 y.o. 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 Male) Treating RN: Ahmed Prima Primary Care Provider: Vic Blackbird Other Clinician: Referring Provider: Referral, Self Treating Provider/Extender: Ricard Dillon Weeks in Treatment: 2 Subjective History of Present Illness (HPI) 03/18/17 patient presents today for initial evaluation concerning an injury which occurred roughly 2 months ago when he was using a tiller at his home. He tells me that he was carrying this when it cut his left anterior shin. He did not go see anyone immediately as he felt that it would just heal on its own but obviously he tells me that's not the case. This ended up red and inflamed surrounding and he was diagnosed with cellulitis at the urgent care when he was seen on 02/19/17. He was started on Augmentin at that time and then had a follow-up wound care check on 03/11/17 at urgent care as well. Fortunately there is no evidence of infection at that point the Bactroban was prescribed for him at that time. He has  been using that sense. Patient is unaware of the fact that he had issues with blood flow in his lower extremities bilaterally until all this happened and they began evaluating him at urgent care. Subsequently he has been scheduled for an arterial study with ABI and TBI on 03/15/17 and this will be scheduled for 04/10/17. 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 He states that he has discomfort about one out of 10 at rest described as a sharp/burning sensation that is worsened to four out of 10 with cleansing the wound. Other than those medical conditions patient appears to be in good health and remains physically active even at 66 years of age. 03/26/17 on evaluation today patient appears to be doing fairly well in regard to his left lower extremity wound. The overall appearance is greatly improved although his measurements really have not shifted at all. He does have an appointment for a vascular evaluation due to his diminished blood flow in the bilateral lower extremities with associated history of potential vascular claudication. This appointment is scheduled for April 10, 2017 in [place]. Otherwise patient has no significant pain and states that he asked he feels like his "neuropathy" is doing better. 04/02/17; traumatic wound on his left anterior leg about 2-1/2 months ago which has not really made any substantial progression towards healing. The patient has been using Santyl. He has had a venous ultrasound that was negative for DVT. He has arterial studies and apparently a vascular surgeon consultation booked for 04/10/17. The patient has a St. Jude's aortic valve and is on Coumadin. He complains of discomfort predominantly in the plantar aspect of his left foot and lateral  4 toes. This is worse at night and may actually reflect plantar fasciitis. But with ongoing questioning I'm quite convinced that he has claudication approximately 100 yards when walking to his barn from his house aching discomfort in both legs Edward Marquez, Edward Marquez ([id number]) Objective Constitutional Patient is hypertensive.. Pulse  regular and within target range for patient.Marland Kitchen Respirations regular, non-labored and within target range.. Temperature is normal and within the target range for the patient.Marland Kitchen appears in no distress. Vitals Time Taken: 8:47 AM, Height: 70 in, Weight: 180 lbs, BMI: 25.8, Temperature: 97.8 F, Pulse: 62 bpm, Respiratory Rate: 18 breaths/min, Blood Pressure: 163/78 mmHg. Respiratory Respiratory effort is easy and symmetric bilaterally. Rate is normal at rest and on room air.. Cardiovascular Mechanical second sound no murmurs. Femoral arteries without bruits and pulses strong.. Pedal pulses palpable and strong bilaterally.. Edema present in both extremities. No edema. Lymphatic None palpable in the left popliteal or inguinal area. Musculoskeletal Is no tenderness over the metatarsal heads. Neurological Normal to the microfilament test in his dorsal foot although he seems to have some loss in the plantar lateral foot and plantar toes. Psychiatric No evidence of depression, anxiety, or agitation. Calm, cooperative, and communicative. Appropriate interactions and affect.. General Notes: Wound exam; small oval-shaped wound on his left anterior pretibial area. Base of it has some surface slough but did not require debridement. There is no evidence of surrounding infection. I could not feel pedal pulses on him nor femoral pulses. Papillary refill time is delayed Integumentary (Hair, Skin) Wound #1 status is Open. Original cause of wound was Trauma. The wound is located on the Left,Anterior Lower Leg. The wound measures 1.5cm length x 0.4cm width x 0.2cm depth; 0.471cm^2  area and 0.094cm^3 volume. There is Fat Layer (Subcutaneous Tissue) Exposed exposed. There is no tunneling or undermining noted. There is a large amount of serous drainage noted. The wound margin is epibole. There is no granulation within the wound bed. There is a large (67-100%) amount of necrotic tissue within the wound bed including Adherent Slough. The periwound skin appearance exhibited: Erythema. The periwound skin appearance did not exhibit: Callus, Crepitus, Excoriation, Induration, Rash, Scarring, Dry/Scaly, Maceration, Atrophie Blanche, Cyanosis, Ecchymosis, Hemosiderin Staining, Mottled, Pallor, Rubor. The surrounding wound skin color is noted with erythema which is circumferential. Periwound temperature was noted as No Abnormality. The periwound has tenderness on palpation. 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 Anesthetic: Wound #1 Left,Anterior Lower Leg: Topical Lidocaine 4% cream applied to wound bed prior to debridement Skin Barriers/Peri-Wound Care: Wound #1 Left,Anterior Lower Leg: Skin Prep Primary Wound Dressing: Wound #1 Left,Anterior Lower Leg: Santyl Ointment Secondary Dressing: Wound #1 Left,Anterior Lower Leg: Dry Gauze Boardered Foam Dressing Dressing Change Frequency: Wound #1 Left,Anterior Lower Leg: Change dressing every day. Follow-up Appointments: Wound #1 Left,Anterior Lower Leg: Return Appointment in 1 week. Edema Control: Wound #1 Left,Anterior Lower Leg: Elevate legs to the level of the heart and pump ankles as often as possible Additional Orders  / Instructions: Wound #1 Left,Anterior Lower Leg: Increase protein intake. Other: - Please add vitamin A, vitamin C and zinc supplements to your diet Medications-please add to medication list.: Edward Marquez, Edward Marquez ([id number]) Wound #1 Left,Anterior Lower Leg: Santyl Enzymatic Ointment 1; continue with santyl 2 agree with suggestion of significant arterial insufficiency. oproximal 3 his complaints of numbness in the foot are difficult to understand. Not compatible with a nerve injury in the location of his wound 4 o component of planter fascitis Electronic  Signature(s) Signed: 04/02/2017 5:34:31 PM By: Linton Ham MD Entered By: Linton Ham on 04/02/2017 10:04:09 Edward Marquez ([id number]) -------------------------------------------------------------------------------- [place] Details Patient Name: Edward Marquez Date of Service: 04/02/2017 Medical Record Number: [id number] Patient Account Number: [id number] Date of Birth/Sex: 1950/08/26 (66 y.o. Male) Treating RN: Carolyne Fiscal, Debi Primary Care Provider: Vic Blackbird Other Clinician: Referring Provider: Referral, Self Treating Provider/Extender: Ricard Dillon Weeks in Treatment: 2 Diagnosis Coding ICD-10 Codes Code Description S81.802A Unspecified open wound, left lower leg, initial encounter I73.9 Peripheral vascular disease, unspecified L97.822 Non-pressure chronic ulcer of other part of left lower leg with fat layer exposed I48.2 Chronic atrial fibrillation Z95.2 Presence of prosthetic heart valve Facility Procedures CPT4 Code: [id number] Description: 99213 - WOUND CARE VISIT-LEV 3 EST PT Modifier: Quantity: 1 Physician Procedures CPT4: Description Modifier Quantity Code [id number] 99213 - WC PHYS LEVEL 3 - EST PT 1 ICD-10 Description Diagnosis L97.822 Non-pressure chronic ulcer of other part of left lower leg with fat layer exposed I73.9 Peripheral vascular disease, unspecified Electronic Signature(s) Signed:  04/02/2017 4:15:31 PM By: Alric Quan Signed: 04/02/2017 5:34:31 PM By: Linton Ham MD Entered By: Alric Quan on 04/02/2017 10:11:47

## 2017-04-04 NOTE — Progress Notes (Signed)
BRANDOM, KERWIN ([id number]) Visit Report for 04/02/2017 Arrival Information Details Patient Name: GUNNISON, CHAHAL Date of Service: 04/02/2017 8:45 AM Medical Record Number: [id number] Patient Account Number: [id number] Date of Birth/Sex: [birth year] (66 y.o. Male) Treating RN: Ahmed Prima Primary Care Bria Portales: Vic Blackbird Other Clinician: Referring Sheilyn Boehlke: Referral, Self Treating Amarylis Rovito/Extender: Tito Dine in Treatment: 2 Visit Information History Since Last Visit All ordered tests and consults were completed: No Patient Arrived: Ambulatory Added or deleted any medications: No Arrival Time: 08:46 Any new allergies or adverse reactions: No Accompanied By: self Had a fall or experienced change in No Transfer Assistance: None activities of daily living that may affect Patient Identification Verified: Yes risk of falls: Secondary Verification Process Yes Signs or symptoms of abuse/neglect since last No Completed: visito Patient Requires Transmission- No Hospitalized since last visit: No Based Precautions: Has Dressing in Place as Prescribed: Yes Patient Has Alerts: Yes Pain Present Now: No Patient Alerts: Patient on Blood Thinner ABI [place] BILATERAL >220 Coumadin Electronic Signature(s) Signed: 04/02/2017 4:15:31 PM By: Alric Quan Entered By: Alric Quan on 04/02/2017 09:14:30 [name] ([id number]) -------------------------------------------------------------------------------- Clinic Level of Care Assessment Details Patient Name: [name] Date of Service: 04/02/2017 8:45 AM Medical Record Number: [id number] Patient Account Number: [id number] Date of Birth/Sex: [birth year] (66 y.o. Male) Treating RN: Carolyne Fiscal, Debi Primary Care Diontre Harps: Vic Blackbird Other Clinician: Referring Chanceler Pullin: Referral, Self Treating Drevon Plog/Extender: Tito Dine in Treatment: 2 Clinic Level of Care Assessment Items TOOL 4 Quantity  Score X - Use when only an EandM is performed on FOLLOW-UP visit 1 0 ASSESSMENTS - Nursing Assessment / Reassessment X - Reassessment of Co-morbidities (includes updates in patient status) 1 10 X - Reassessment of Adherence to Treatment Plan 1 5 ASSESSMENTS - Wound and Skin Assessment / Reassessment X - Simple Wound Assessment / Reassessment - one wound 1 5 []  - Complex Wound Assessment / Reassessment - multiple wounds 0 []  - Dermatologic / Skin Assessment (not related to wound area) 0 ASSESSMENTS - Focused Assessment []  - Circumferential Edema Measurements - multi extremities 0 []  - Nutritional Assessment / Counseling / Intervention 0 []  - Lower Extremity Assessment (monofilament, tuning fork, pulses) 0 []  - Peripheral Arterial Disease Assessment (using hand held doppler) 0 ASSESSMENTS - Ostomy and/or Continence Assessment and Care []  - Incontinence Assessment and Management 0 []  - Ostomy Care Assessment and Management (repouching, etc.) 0 PROCESS - Coordination of Care X - Simple Patient / Family Education for ongoing care 1 15 []  - Complex (extensive) Patient / Family Education for ongoing care 0 []  - Staff obtains Programmer, systems, Records, Test Results / Process Orders 0 []  - Staff telephones HHA, Nursing Homes / Clarify orders / etc 0 []  - Routine Transfer to another Facility (non-emergent condition) 0 [place], Georgio ([id number]) []  - Routine Hospital Admission (non-emergent condition) 0 []  - New Admissions / Biomedical engineer / Ordering NPWT, Apligraf, etc. 0 []  - Emergency Hospital Admission (emergent condition) 0 X - Simple Discharge Coordination 1 10 []  - Complex (extensive) Discharge Coordination 0 PROCESS - Special Needs []  - Pediatric / Minor Patient Management 0 []  - Isolation Patient Management 0 []  - Hearing / Language / Visual special needs 0 []  - Assessment of Community assistance (transportation, D/C planning, etc.) 0 []  - Additional assistance / Altered mentation  0 []  - Support Surface(s) Assessment (bed, cushion, seat, etc.) 0 INTERVENTIONS - Wound Cleansing / Measurement X - Simple Wound Cleansing - one wound 1 5 []  - Complex Wound Cleansing -  multiple wounds 0 X - Wound Imaging (photographs - any number of wounds) 1 5 []  - Wound Tracing (instead of photographs) 0 X - Simple Wound Measurement - one wound 1 5 []  - Complex Wound Measurement - multiple wounds 0 INTERVENTIONS - Wound Dressings X - Small Wound Dressing one or multiple wounds 1 10 []  - Medium Wound Dressing one or multiple wounds 0 []  - Large Wound Dressing one or multiple wounds 0 X - Application of Medications - topical 1 5 []  - Application of Medications - injection 0 INTERVENTIONS - Miscellaneous []  - External ear exam 0 Plocher, Jahzeel ([id number]) []  - Specimen Collection (cultures, biopsies, blood, body fluids, etc.) 0 []  - Specimen(s) / Culture(s) sent or taken to Lab for analysis 0 []  - Patient Transfer (multiple staff / Harrel Lemon Lift / Similar devices) 0 []  - Simple Staple / Suture removal (25 or less) 0 []  - Complex Staple / Suture removal (26 or more) 0 []  - Hypo / Hyperglycemic Management (close monitor of Blood Glucose) 0 []  - Ankle / Brachial Index (ABI) - do not check if billed separately 0 X - Vital Signs 1 5 Has the patient been seen at the hospital within the last three years: Yes Total Score: 80 Level Of Care: New/Established - Level 3 Electronic Signature(s) Signed: 04/02/2017 4:15:31 PM By: Alric Quan Entered By: Alric Quan on 04/02/2017 10:11:35 [name] ([id number]) -------------------------------------------------------------------------------- Encounter Discharge Information Details Patient Name: [name] Date of Service: 04/02/2017 8:45 AM Medical Record Number: [id number] Patient Account Number: [id number] Date of Birth/Sex: [birth year] (66 y.o. Male) Treating RN: Ahmed Prima Primary Care Frank Pilger: Vic Blackbird Other  Clinician: Referring Trudi Morgenthaler: Referral, Self Treating Mithra Spano/Extender: Tito Dine in Treatment: 2 Encounter Discharge Information Items Discharge Pain Level: 0 Discharge Condition: Stable Ambulatory Status: Ambulatory Discharge Destination: Home Transportation: Private Auto Accompanied By: self Schedule Follow-up Appointment: Yes Medication Reconciliation completed and provided to Patient/Care No Dynver Clemson: Provided on Clinical Summary of Care: 04/02/2017 Form Type Recipient Paper Patient BW Electronic Signature(s) Signed: 04/03/2017 8:53:21 AM By: Ruthine Dose Entered By: Ruthine Dose on 04/02/2017 09:16:57 [name] ([id number]) -------------------------------------------------------------------------------- Lower Extremity Assessment Details Patient Name: [name] Date of Service: 04/02/2017 8:45 AM Medical Record Number: [id number] Patient Account Number: [id number] Date of Birth/Sex: December 09, 1950 (66 y.o. Male) Treating RN: Ahmed Prima Primary Care Doll Frazee: Vic Blackbird Other Clinician: Referring Eboni Coval: Referral, Self Treating Temeka Pore/Extender: Ricard Dillon Weeks in Treatment: 2 Vascular Assessment Pulses: Dorsalis Pedis Palpable: [Left:Yes] Doppler Audible: [Left:Yes] Posterior Tibial Extremity colors, hair growth, and conditions: Extremity Color: [Left:Red] Temperature of Extremity: [Left:Warm] Capillary Refill: [Left:> 3 seconds] Toe Nail Assessment Left: Right: Thick: No Discolored: No Deformed: No Improper Length and Hygiene: No Electronic Signature(s) Signed: 04/02/2017 4:15:31 PM By: Alric Quan Entered By: Alric Quan on 04/02/2017 08:54:43 [name] ([id number]) -------------------------------------------------------------------------------- Multi Wound Chart Details Patient Name: [name] Date of Service: 04/02/2017 8:45 AM Medical Record Number: [id number] Patient Account Number:  [id number] Date of Birth/Sex: August 07, 1951 (66 y.o. Male) Treating RN: Carolyne Fiscal, Debi Primary Care Jazz Rogala: Vic Blackbird Other Clinician: Referring Amulya Quintin: Referral, Self Treating Ski Polich/Extender: Ricard Dillon Weeks in Treatment: 2 Vital Signs Height(in): 70 Pulse(bpm): 62 Weight(lbs): 180 Blood Pressure 163/78 (mmHg): Body Mass Index(BMI): 26 Temperature(F): 97.8 Respiratory Rate 18 (breaths/min): Photos: [1:No Photos] [N/A:N/A] Wound Location: [1:Left Lower Leg - Anterior N/A] Wounding Event: [1:Trauma] [N/A:N/A] Primary Etiology: [1:Trauma, Other] [N/A:N/A] Comorbid History: [1:Arrhythmia, Hypertension, N/A Gout] Date Acquired: [1:02/15/2017] [N/A:N/A] Weeks of Treatment: [1:2] [N/A:N/A] Wound Status: [1:Open] [N/A:N/A]  Measurements L x W x D 1.5x0.4x0.2 [N/A:N/A] (cm) Area (cm) : [1:0.471] [N/A:N/A] Volume (cm) : [1:0.094] [N/A:N/A] % Reduction in Area: [1:20.00%] [N/A:N/A] % Reduction in Volume: 20.30% [N/A:N/A] Classification: [1:Full Thickness With Exposed Support Structures] [N/A:N/A] Exudate Amount: [1:Large] [N/A:N/A] Exudate Type: [1:Serous] [N/A:N/A] Exudate Color: [1:amber] [N/A:N/A] Wound Margin: [1:Epibole] [N/A:N/A] Granulation Amount: [1:None Present (0%)] [N/A:N/A] Necrotic Amount: [1:Large (67-100%)] [N/A:N/A] Exposed Structures: [1:Fat Layer (Subcutaneous N/A Tissue) Exposed: Yes Fascia: No Tendon: No Muscle: No] Joint: No Bone: No Epithelialization: None N/A N/A Periwound Skin Texture: Excoriation: No N/A N/A Induration: No Callus: No Crepitus: No Rash: No Scarring: No Periwound Skin Maceration: No N/A N/A Moisture: Dry/Scaly: No Periwound Skin Color: Erythema: Yes N/A N/A Atrophie Blanche: No Cyanosis: No Ecchymosis: No Hemosiderin Staining: No Mottled: No Pallor: No Rubor: No Erythema Location: Circumferential N/A N/A Temperature: No Abnormality N/A N/A Tenderness on Yes N/A N/A Palpation: Wound Preparation: Ulcer  Cleansing: N/A N/A Rinsed/Irrigated with Saline Topical Anesthetic Applied: Other: lidocaine 4% Treatment Notes Wound #1 (Left, Anterior Lower Leg) 1. Cleansed with: Clean wound with Normal Saline 2. Anesthetic Topical Lidocaine 4% cream to wound bed prior to debridement 3. Peri-wound Care: Skin Prep 4. Dressing Applied: Santyl Ointment 5. Secondary Dressing Applied Bordered Foam Dressing Dry Gauze Electronic Signature(s) Signed: 04/02/2017 5:34:31 PM By: Linton Ham MD [place], [place] ([id number]) Entered By: Linton Ham on 04/02/2017 09:55:26 [name] ([id number]) -------------------------------------------------------------------------------- Multi-Disciplinary Care Plan Details Patient Name: [name] Date of Service: 04/02/2017 8:45 AM Medical Record Number: [id number] Patient Account Number: [id number] Date of Birth/Sex: Dec 26, 1950 (66 y.o. Male) Treating RN: Ahmed Prima Primary Care Anthea Udovich: Vic Blackbird Other Clinician: Referring Berlin Viereck: Referral, Self Treating Tiawana Forgy/Extender: Tito Dine in Treatment: 2 Active Inactive ` Orientation to the Wound Care Program Nursing Diagnoses: Knowledge deficit related to the wound healing center program Goals: Patient/caregiver will verbalize understanding of the [facility] Program Date Initiated: 03/18/2017 Target Resolution Date: 05/24/2017 Goal Status: Active Interventions: Provide education on orientation to the wound center Notes: ` Wound/Skin Impairment Nursing Diagnoses: Impaired tissue integrity Goals: Ulcer/skin breakdown will have a volume reduction of 30% by week 4 Date Initiated: 03/18/2017 Target Resolution Date: 05/24/2017 Goal Status: Active Ulcer/skin breakdown will have a volume reduction of 50% by week 8 Date Initiated: 03/18/2017 Target Resolution Date: 05/24/2017 Goal Status: Active Ulcer/skin breakdown will have a volume reduction of 80% by week  12 Date Initiated: 03/18/2017 Target Resolution Date: 05/24/2017 Goal Status: Active Ulcer/skin breakdown will heal within 14 weeks Date Initiated: 03/18/2017 Target Resolution Date: 05/24/2017 Goal Status: Active Interventions: NANDAN, WILLEMS ([id number]) Assess patient/caregiver ability to obtain necessary supplies Assess patient/caregiver ability to perform ulcer/skin care regimen upon admission and as needed Assess ulceration(s) every visit Notes: Electronic Signature(s) Signed: 04/02/2017 4:15:31 PM By: Alric Quan Entered By: Alric Quan on 04/02/2017 08:54:47 [name] ([id number]) -------------------------------------------------------------------------------- Pain Assessment Details Patient Name: [name] Date of Service: 04/02/2017 8:45 AM Medical Record Number: [id number] Patient Account Number: [id number] Date of Birth/Sex: 1950-11-05 (66 y.o. Male) Treating RN: Ahmed Prima Primary Care Corneilus Heggie: Vic Blackbird Other Clinician: Referring Fermina Mishkin: Referral, Self Treating Jemal Miskell/Extender: Ricard Dillon Weeks in Treatment: 2 Active Problems Location of Pain Severity and Description of Pain Patient Has Paino No Site Locations With Dressing Change: No Pain Management and Medication Current Pain Management: Electronic Signature(s) Signed: 04/02/2017 4:15:31 PM By: Alric Quan Entered By: Alric Quan on 04/02/2017 08:47:05 [name] ([id number]) -------------------------------------------------------------------------------- Patient/Caregiver Education Details Patient Name: [name] Date of Service: 04/02/2017 8:45 AM Medical  Record Number: [id number] Patient Account Number: [id number] Date of Birth/Gender: 10/07/1950 (66 y.o. Male) Treating RN: Carolyne Fiscal, Debi Primary Care Physician: Vic Blackbird Other Clinician: Referring Physician: Referral, Self Treating Physician/Extender: Tito Dine in Treatment:  2 Education Assessment Education Provided To: Patient Education Topics Provided Wound/Skin Impairment: Handouts: Other: change dressing as ordered Methods: Demonstration, Explain/Verbal Responses: State content correctly Electronic Signature(s) Signed: 04/02/2017 4:15:31 PM By: Alric Quan Entered By: Alric Quan on 04/02/2017 08:57:47 [name] ([id number]) -------------------------------------------------------------------------------- Wound Assessment Details Patient Name: [name] Date of Service: 04/02/2017 8:45 AM Medical Record Number: [id number] Patient Account Number: [id number] Date of Birth/Sex: 06-Sep-1950 (66 y.o. Male) Treating RN: Carolyne Fiscal, Debi Primary Care Aunesti Pellegrino: Vic Blackbird Other Clinician: Referring Ja Ohman: Referral, Self Treating Honestie Kulik/Extender: Ricard Dillon Weeks in Treatment: 2 Wound Status Wound Number: 1 Primary Etiology: Trauma, Other Wound Location: Left Lower Leg - Anterior Wound Status: Open Wounding Event: Trauma Comorbid History: Arrhythmia, Hypertension, Gout Date Acquired: 02/15/2017 Weeks Of Treatment: 2 Clustered Wound: No Photos Photo Uploaded By: Alric Quan on 04/02/2017 10:21:10 Wound Measurements Length: (cm) 1.5 Width: (cm) 0.4 Depth: (cm) 0.2 Area: (cm) 0.471 Volume: (cm) 0.094 % Reduction in Area: 20% % Reduction in Volume: 20.3% Epithelialization: None Tunneling: No Undermining: No Wound Description Full Thickness With Exposed Classification: Support Structures Wound Margin: Epibole Exudate Large Amount: Exudate Type: Serous Exudate Color: amber Foul Odor After Cleansing: No Slough/Fibrino Yes Wound Bed Granulation Amount: None Present (0%) Exposed Structure Necrotic Amount: Large (67-100%) Fascia Exposed: No Necrotic Quality: Adherent Slough Fat Layer (Subcutaneous Tissue) Exposed: Yes Beaird, Hiroki ([id number]) Tendon Exposed: No Muscle Exposed: No Joint Exposed:  No Bone Exposed: No Periwound Skin Texture Texture Color No Abnormalities Noted: No No Abnormalities Noted: No Callus: No Atrophie Blanche: No Crepitus: No Cyanosis: No Excoriation: No Ecchymosis: No Induration: No Erythema: Yes Rash: No Erythema Location: Circumferential Scarring: No Hemosiderin Staining: No Mottled: No Moisture Pallor: No No Abnormalities Noted: No Rubor: No Dry / Scaly: No Maceration: No Temperature / Pain Temperature: No Abnormality Tenderness on Palpation: Yes Wound Preparation Ulcer Cleansing: Rinsed/Irrigated with Saline Topical Anesthetic Applied: Other: lidocaine 4%, Treatment Notes Wound #1 (Left, Anterior Lower Leg) 1. Cleansed with: Clean wound with Normal Saline 2. Anesthetic Topical Lidocaine 4% cream to wound bed prior to debridement 3. Peri-wound Care: Skin Prep 4. Dressing Applied: Santyl Ointment 5. Secondary Dressing Applied Bordered Foam Dressing Dry Gauze Electronic Signature(s) Signed: 04/02/2017 4:15:31 PM By: Alric Quan Entered By: Alric Quan on 04/02/2017 08:52:40 [name] ([id number]) -------------------------------------------------------------------------------- [place] Details Patient Name: [name] Date of Service: 04/02/2017 8:45 AM Medical Record Number: [id number] Patient Account Number: [id number] Date of Birth/Sex: [birth year] (66 y.o. Male) Treating RN: Carolyne Fiscal, Debi Primary Care Claudius Mich: Vic Blackbird Other Clinician: Referring Ethyn Schetter: Referral, Self Treating Corrie Reder/Extender: Tito Dine in Treatment: 2 Vital Signs Time Taken: 08:47 Temperature (F): 97.8 Height (in): 70 Pulse (bpm): 62 Weight (lbs): 180 Respiratory Rate (breaths/min): 18 Body Mass Index (BMI): 25.8 Blood Pressure (mmHg): 163/78 Reference Range: 80 - 120 mg / dl Electronic Signature(s) Signed: 04/02/2017 4:15:31 PM By: Alric Quan Entered By: Alric Quan on 04/02/2017 08:48:42

## 2017-04-09 ENCOUNTER — Encounter: Payer: Medicare Other | Admitting: Internal Medicine

## 2017-04-09 DIAGNOSIS — I10 Essential (primary) hypertension: Secondary | ICD-10-CM | POA: Diagnosis not present

## 2017-04-09 DIAGNOSIS — I482 Chronic atrial fibrillation: Secondary | ICD-10-CM | POA: Diagnosis not present

## 2017-04-09 DIAGNOSIS — Z952 Presence of prosthetic heart valve: Secondary | ICD-10-CM | POA: Diagnosis not present

## 2017-04-09 DIAGNOSIS — M109 Gout, unspecified: Secondary | ICD-10-CM | POA: Diagnosis not present

## 2017-04-09 DIAGNOSIS — S81802A Unspecified open wound, left lower leg, initial encounter: Secondary | ICD-10-CM | POA: Diagnosis not present

## 2017-04-09 DIAGNOSIS — L97822 Non-pressure chronic ulcer of other part of left lower leg with fat layer exposed: Secondary | ICD-10-CM | POA: Diagnosis not present

## 2017-04-09 DIAGNOSIS — I739 Peripheral vascular disease, unspecified: Secondary | ICD-10-CM | POA: Diagnosis not present

## 2017-04-10 ENCOUNTER — Encounter: Payer: Self-pay | Admitting: Vascular Surgery

## 2017-04-10 ENCOUNTER — Ambulatory Visit (INDEPENDENT_AMBULATORY_CARE_PROVIDER_SITE_OTHER): Payer: Medicare Other | Admitting: Vascular Surgery

## 2017-04-10 ENCOUNTER — Ambulatory Visit (HOSPITAL_COMMUNITY)
Admission: RE | Admit: 2017-04-10 | Discharge: 2017-04-10 | Disposition: A | Discharge status: Home / Self Care | Payer: Medicare Other | Source: Ambulatory Visit | Attending: Vascular Surgery | Admitting: Vascular Surgery

## 2017-04-10 VITALS — BP 147/79 | HR 64 | Temp 97.9°F | Ht 69.5 in | Wt 182.6 lb

## 2017-04-10 DIAGNOSIS — I70299 Other atherosclerosis of native arteries of extremities, unspecified extremity: Secondary | ICD-10-CM

## 2017-04-10 DIAGNOSIS — I872 Venous insufficiency (chronic) (peripheral): Secondary | ICD-10-CM

## 2017-04-10 DIAGNOSIS — L97909 Non-pressure chronic ulcer of unspecified part of unspecified lower leg with unspecified severity: Secondary | ICD-10-CM | POA: Diagnosis not present

## 2017-04-10 DIAGNOSIS — I739 Peripheral vascular disease, unspecified: Secondary | ICD-10-CM | POA: Diagnosis not present

## 2017-04-10 NOTE — Progress Notes (Signed)
Patient name: Edward Marquez MRN: [id number] DOB: 02/01/1951 Sex: male   REASON FOR CONSULT:    Paresthesias of right foot.  The consult is requested by the [facility].  HPI:   Edward Marquez is a pleasant 66 y.o. male,  Who cut his left leg approximately 3 months ago while working in the yard. He realized later that he probably should've had stitches for this wound area the wound was slow to heal and ultimately he was in the wound care clinic and is undergoing aggressive wound care. He states that the wound is making slow progress. Dressing changes are being done with Santyl. He had been on antibiotics for a week but is off antibiotics now.  Prior to this event, he denied any history of claudication, rest pain, or nonhealing ulcers. He is fairly active.  His only real risk factor for peripheral vascular disease is hypertension. He denies any history of diabetes, hypercholesterolemia, family history of premature cardiovascular disease, or smoking history.  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 His surgery was done at [facility]. He has had multiple ablation procedures that were done by Dr. Adrian Prows who is now in [facility]. He is currently not in atrial fibrillation.  He is unaware of any previous history of DVT or phlebitis.  I reviewed his records from the [facility] visit on 8-18. He was complaining of numbness and tingling in the left foot at that time. The symptoms have resolved.  Past Medical History:  Diagnosis Date  . Aortic valve defect   . Atrial fibrillation ([place])   . CHF (congestive heart failure) ([place])   . Gout   . Hypertension     Family History  Problem Relation Age of Onset  . Heart disease Mother 5       bypass surgery  There is no family history of premature cardiovascular disease.   SOCIAL HISTORY: He is not a smoker. Social History   Social History  .  Marital status: Single    Spouse name: N/A  . Number of children: N/A  . Years of education: N/A   Occupational History  . Not on file.   Social History Main Topics  . Smoking status: Never Smoker  . Smokeless tobacco: Never Used  . Alcohol use Yes  . Drug use: No  . Sexual activity: Not on file   Other Topics Concern  . Not on file   Social History Narrative  . No narrative on file    Allergies  Allergen Reactions  . Ivp Dye [Iodinated Diagnostic Agents] Hives    Current Outpatient Prescriptions  Medication Sig Dispense Refill  . diltiazem (CARDIZEM CD) 120 MG 24 hr capsule Take 120 mg by mouth daily.    Marland Kitchen dofetilide (TIKOSYN) 500 MCG capsule Take 500 mcg by mouth every morning.    . metoprolol succinate (TOPROL-XL) 100 MG 24 hr tablet Take 100 mg by mouth daily. Take with or immediately following a meal.    . mupirocin ointment (BACTROBAN) 2 % Apply 1 application topically 2 (two) times daily. 22 g 0  . ramipril (ALTACE) 2.5 MG capsule Take 2.5 mg by mouth daily.    . vitamin B-12 (CYANOCOBALAMIN) 1000 MCG tablet Take 1,000 mcg by mouth daily.    Marland Kitchen warfarin (COUMADIN) 7.5 MG tablet Take 7.5 mg by mouth daily. Takes 7.5mg  on Sun, Mon, Wed, Thurs, Fri, Sat    .  warfarin (COUMADIN) 7.5 MG tablet Take 1 tablet (7.5 mg total) by mouth daily. (Patient taking differently: Take 7.5 mg by mouth daily. Takes 3.75 mg on Tuesdays) 30 tablet 0   No current facility-administered medications for this visit.     REVIEW OF SYSTEMS:  [X]  denotes positive finding, [ ]  denotes negative finding Cardiac  Comments:  Chest pain or chest pressure:    Shortness of breath upon exertion:    Short of breath when lying flat:    Irregular heart rhythm:        Vascular    Pain in calf, thigh, or hip brought on by ambulation: X   Pain in feet at night that wakes you up from your sleep:  X   Blood clot in your veins:    Leg swelling:         Pulmonary    Oxygen at home:    Productive cough:      Wheezing:         Neurologic    Sudden weakness in arms or legs:     Sudden numbness in arms or legs:     Sudden onset of difficulty speaking or slurred speech:    Temporary loss of vision in one eye:     Problems with dizziness:         Gastrointestinal    Blood in stool:     Vomited blood:         Genitourinary    Burning when urinating:     Blood in urine:        Psychiatric    Major depression:         Hematologic    Bleeding problems:    Problems with blood clotting too easily:        Skin    Rashes or ulcers:        Constitutional    Fever or chills:     PHYSICAL EXAM:   Vitals:   04/10/17 1553  BP: (!) 147/79  Pulse: 64  Temp: 97.9 F (36.6 C)  Weight: 182 lb 9.6 oz (82.8 kg)  Height: 5' 9.5" (1.765 m)    GENERAL: The patient is a well-nourished male, in no acute distress. The vital signs are documented above. CARDIAC: There is a regular rate and rhythm. He has crisp heart sounds from his aortic valve. VASCULAR: I do not detect carotid bruits. On the left side, which is the symptomatic side, he has a palpable femoral pulse and popliteal pulse. I cannot palpate pedal pulses. He has monophasic Doppler signals in the dorsalis pedis and posterior tibial positions which are dampened. On the right side he has a palpable femoral and popliteal pulse. I cannot palpate pedal pulses. He has a monophasic dorsalis pedis and posterior tibial signal on the right. He has hyperpigmentation bilaterally consistent with chronic venous insufficiency. 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 He has no significant lower extremity swelling. PULMONARY: There is good air exchange bilaterally without wheezing or rales. ABDOMEN: Soft and non-tender with normal pitched bowel sounds.  I do not palpate an aneurysm. MUSCULOSKELETAL: There are no major deformities or cyanosis. NEUROLOGIC: No focal weakness or paresthesias are detected. SKIN: He has hyperpigmentation bilaterally  consistent with chronic venous insufficiency. He has a laceration on the medial left leg that measures 1.5 cm in length and 3 mm in width. There is no significant drainage. There is some mild cellulitis associated with this. PSYCHIATRIC: The patient has a normal affect.  DATA:    ARTERIAL DOPPLER STUDY: I have independently interpreted his arterial Doppler study today.  On the left side, which is the symptomatic side, the posterior tibial and dorsalis pedis arteries are noncompressible. ABI could not be obtained. Toe pressure on the left is 0.  On the right side, there is a monophasic dorsalis pedis and posterior tibial signal. ABI is 100% however again the arteries are calcified and this is falsely elevated. Toe pressure on the right is 12 mmHg.  VENOUS DUPLEX: I reviewed his venous duplex scan that was done on 8-18. This showed no evidence of DVT bilaterally.  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 He's had the wound for proximally 3 months and this is very slow to heal. I think that his tibial artery occlusive disease could be contributing to this. For this reason I recommended arteriography to further evaluate his circulation. If he has disease amenable to angioplasty this could potentially be done at the same time. I have reviewed with the patient the indications for arteriography. In addition, I have reviewed the potential complications of arteriography including but not limited to: Bleeding, arterial injury, arterial thrombosis, dye action, renal insufficiency, or other unpredictable medical problems. I have explained to the patient that if we find disease amenable to angioplasty we could potentially address this at the same time. I have discussed the potential complications of angioplasty and stenting, including but not limited to: Bleeding, arterial thrombosis,  arterial injury, dissection, or the need for surgical intervention. He does have a dye allergy and would have to be premedicated prior to the procedure. I will make further recommendations pending the results of his arteriogram.  In addition we will have to hold his Coumadin for 4 days prior to the procedure. We will try to get ahold of Dr. Adrian Prows to determine if he needs a Coumadin bridge given his history of a prosthetic aortic valve replacement.   CHRONIC VENOUS INSUFFICIENCY: On exam he has evidence of chronic venous insufficiency. 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 I have explained the treatment typically 4 and also related to chronic venous insufficiency as elevation and compression. However given his tibial artery occlusive disease this may be contraindicated and for this reason also I think we need to proceed with arteriography.   [name] [facility] [phone]

## 2017-04-11 NOTE — Progress Notes (Signed)
DRESHAWN, HENDERSHOTT ([id number]) Visit Report for 04/09/2017 Arrival Information Details Patient Name: Edward Marquez, Edward Marquez Date of Service: 04/09/2017 8:45 AM Medical Record Number: [id number] Patient Account Number: [id number] Date of Birth/Sex: 1951-05-10 (66 y.o. Male) Treating RN: Ahmed Prima Primary Care Dori Devino: Vic Blackbird Other Clinician: Referring Sherylann Vangorden: Vic Blackbird Treating Esme Durkin/Extender: Tito Dine in Treatment: 3 Visit Information History Since Last Visit All ordered tests and consults were completed: No Patient Arrived: Ambulatory Added or deleted any medications: No Arrival Time: 08:50 Any new allergies or adverse reactions: No Accompanied By: self Had a fall or experienced change in No Transfer Assistance: None activities of daily living that may affect Patient Identification Verified: Yes risk of falls: Secondary Verification Process Yes Signs or symptoms of abuse/neglect since last No Completed: visito Patient Requires Transmission- No Hospitalized since last visit: No Based Precautions: Has Dressing in Place as Prescribed: Yes Patient Has Alerts: Yes Pain Present Now: No Patient Alerts: Patient on Blood Thinner ABI [place] BILATERAL >220 Coumadin Electronic Signature(s) Signed: 04/09/2017 4:37:15 PM By: Alric Quan Entered By: Alric Quan on 04/09/2017 08:51:35 Edward Marquez ([id number]) -------------------------------------------------------------------------------- Encounter Discharge Information Details Patient Name: Edward Marquez Date of Service: 04/09/2017 8:45 AM Medical Record Number: [id number] Patient Account Number: [id number] Date of Birth/Sex: 17-Jan-1951 (66 y.o. Male) Treating RN: Carolyne Fiscal, Debi Primary Care Zriyah Kopplin: Vic Blackbird Other Clinician: Referring Breon Rehm: Vic Blackbird Treating Javonna Balli/Extender: Tito Dine in Treatment: 3 Encounter Discharge Information Items Discharge Pain Level:  0 Discharge Condition: Stable Ambulatory Status: Ambulatory Discharge Destination: Home Transportation: Private Auto Accompanied By: self Schedule Follow-up Appointment: Yes Medication Reconciliation completed and provided to Patient/Care No Chenika Nevils: Provided on Clinical Summary of Care: 04/09/2017 Form Type Recipient Paper Patient BW Electronic Signature(s) Signed: 04/10/2017 9:32:52 AM By: Ruthine Dose Entered By: Ruthine Dose on 04/09/2017 Edward Marquez ([id number]) -------------------------------------------------------------------------------- Lower Extremity Assessment Details Patient Name: Edward Marquez Date of Service: 04/09/2017 8:45 AM Medical Record Number: [id number] Patient Account Number: [id number] Date of Birth/Sex: 1951-07-28 (66 y.o. Male) Treating RN: Ahmed Prima Primary Care Hannie Shoe: Vic Blackbird Other Clinician: Referring Gracin Mcpartland: Vic Blackbird Treating Mikle Sternberg/Extender: Ricard Dillon Weeks in Treatment: 3 Vascular Assessment Pulses: Dorsalis Pedis Palpable: [Left:Yes] Posterior Tibial Extremity colors, hair growth, and conditions: Extremity Color: [Left:Red] Temperature of Extremity: [Left:Warm] Capillary Refill: [Left:> 3 seconds] Toe Nail Assessment Left: Right: Thick: No Discolored: No Deformed: No Improper Length and Hygiene: No Electronic Signature(s) Signed: 04/09/2017 4:37:15 PM By: Alric Quan Entered By: Alric Quan on 04/09/2017 08:57:10 Edward Marquez ([id number]) -------------------------------------------------------------------------------- Multi Wound Chart Details Patient Name: Edward Marquez Date of Service: 04/09/2017 8:45 AM Medical Record Number: [id number] Patient Account Number: [id number] Date of Birth/Sex: 04-21-1951 (66 y.o. Male) Treating RN: Carolyne Fiscal, Debi Primary Care Mattie Novosel: Vic Blackbird Other Clinician: Referring Deryl Giroux: Vic Blackbird Treating Hebah Bogosian/Extender: Ricard Dillon Weeks in Treatment: 3 Vital Signs Height(in): 70 Pulse(bpm): 62 Weight(lbs): 180 Blood Pressure 138/53 (mmHg): Body Mass Index(BMI): 26 Temperature(F): 97.8 Respiratory Rate 18 (breaths/min): Photos: [1:No Photos] [N/A:N/A] Wound Location: [1:Left Lower Leg - Anterior N/A] Wounding Event: [1:Trauma] [N/A:N/A] Primary Etiology: [1:Trauma, Other] [N/A:N/A] Comorbid History: [1:Arrhythmia, Hypertension, N/A Gout] Date Acquired: [1:02/15/2017] [N/A:N/A] Weeks of Treatment: [1:3] [N/A:N/A] Wound Status: [1:Open] [N/A:N/A] Measurements L x W x D 1.5x0.5x0.2 [N/A:N/A] (cm) Area (cm) : [1:0.589] [N/A:N/A] Volume (cm) : [1:0.118] [N/A:N/A] % Reduction in Area: [1:0.00%] [N/A:N/A] % Reduction in Volume: 0.00% [N/A:N/A] Classification: [1:Full Thickness With Exposed Support Structures] [N/A:N/A] Exudate Amount: [1:Large] [N/A:N/A] Exudate Type: [1:Serous] [N/A:N/A] Exudate Color: [1:amber] [N/A:N/A] Wound Margin: [1:Epibole] [  N/A:N/A] Granulation Amount: [1:None Present (0%)] [N/A:N/A] Necrotic Amount: [1:Large (67-100%)] [N/A:N/A] Exposed Structures: [1:Fat Layer (Subcutaneous N/A Tissue) Exposed: Yes Fascia: No Tendon: No Muscle: No] Joint: No Bone: No Epithelialization: None N/A N/A Debridement: Debridement ([id number]- N/A N/A 11047) Pre-procedure 09:04 N/A N/A Verification/Time Out Taken: Pain Control: Lidocaine 4% Topical N/A N/A Solution Tissue Debrided: Fibrin/Slough, Exudates, N/A N/A Subcutaneous Level: Skin/Subcutaneous N/A N/A Tissue Debridement Area (sq 0.75 N/A N/A cm): Instrument: Curette N/A N/A Bleeding: Minimum N/A N/A Hemostasis Achieved: Pressure N/A N/A Procedural Pain: 0 N/A N/A Post Procedural Pain: 0 N/A N/A Debridement Treatment Procedure was tolerated N/A N/A Response: well Post Debridement 1.5x0.5x0.3 N/A N/A Measurements L x W x D (cm) Post Debridement 0.177 N/A N/A Volume: (cm) Periwound Skin Texture: Excoriation: No N/A  N/A Induration: No Callus: No Crepitus: No Rash: No Scarring: No Periwound Skin Maceration: No N/A N/A Moisture: Dry/Scaly: No Periwound Skin Color: Erythema: Yes N/A N/A Atrophie Blanche: No Cyanosis: No Ecchymosis: No Hemosiderin Staining: No Mottled: No Pallor: No Rubor: No Erythema Location: Circumferential N/A N/A Temperature: No Abnormality N/A N/A Tenderness on Yes N/A N/A Palpation: Wound Preparation: Ulcer Cleansing: N/A N/A Rinsed/Irrigated with Edward Marquez ([id number]) Saline Topical Anesthetic Applied: Other: lidocaine 4% Procedures Performed: Debridement N/A N/A Treatment Notes Wound #1 (Left, Anterior Lower Leg) 1. Cleansed with: Clean wound with Normal Saline 2. Anesthetic Topical Lidocaine 4% cream to wound bed prior to debridement 3. Peri-wound Care: Skin Prep 4. Dressing Applied: Santyl Ointment 5. Secondary Dressing Applied Bordered Foam Dressing Dry Gauze Electronic Signature(s) Signed: 04/10/2017 7:49:24 AM By: Linton Ham MD Entered By: Linton Ham on 04/09/2017 09:13:24 Edward Marquez ([id number]) -------------------------------------------------------------------------------- Multi-Disciplinary Care Plan Details Patient Name: Edward Marquez Date of Service: 04/09/2017 8:45 AM Medical Record Number: [id number] Patient Account Number: [id number] Date of Birth/Sex: 11/10/1950 (66 y.o. Male) Treating RN: Ahmed Prima Primary Care Kasen Sako: Vic Blackbird Other Clinician: Referring Cyndia Degraff: Vic Blackbird Treating Yolande Skoda/Extender: Tito Dine in Treatment: 3 Active Inactive ` Orientation to the Wound Care Program Nursing Diagnoses: Knowledge deficit related to the wound healing center program Goals: Patient/caregiver will verbalize understanding of the [facility] Date Initiated: 03/18/2017 Target Resolution Date: 05/24/2017 Goal Status: Active Interventions: Provide education on  orientation to the wound center Notes: ` Wound/Skin Impairment Nursing Diagnoses: Impaired tissue integrity Goals: Ulcer/skin breakdown will have a volume reduction of 30% by week 4 Date Initiated: 03/18/2017 Target Resolution Date: 05/24/2017 Goal Status: Active Ulcer/skin breakdown will have a volume reduction of 50% by week 8 Date Initiated: 03/18/2017 Target Resolution Date: 05/24/2017 Goal Status: Active Ulcer/skin breakdown will have a volume reduction of 80% by week 12 Date Initiated: 03/18/2017 Target Resolution Date: 05/24/2017 Goal Status: Active Ulcer/skin breakdown will heal within 14 weeks Date Initiated: 03/18/2017 Target Resolution Date: 05/24/2017 Goal Status: Active Interventions: KEIFER, HABIB ([id number]) Assess patient/caregiver ability to obtain necessary supplies Assess patient/caregiver ability to perform ulcer/skin care regimen upon admission and as needed Assess ulceration(s) every visit Notes: Electronic Signature(s) Signed: 04/09/2017 4:37:15 PM By: Alric Quan Entered By: Alric Quan on 04/09/2017 08:57:13 Edward Marquez ([id number]) -------------------------------------------------------------------------------- Pain Assessment Details Patient Name: Edward Marquez Date of Service: 04/09/2017 8:45 AM Medical Record Number: [id number] Patient Account Number: [id number] Date of Birth/Sex: 05/12/1951 (66 y.o. Male) Treating RN: Ahmed Prima Primary Care Kattleya Kuhnert: Vic Blackbird Other Clinician: Referring Lawayne Hartig: Vic Blackbird Treating Leba Tibbitts/Extender: Ricard Dillon Weeks in Treatment: 3 Active Problems Location of Pain Severity and Description of Pain Patient Has Paino No Site Locations Pain Management  and Medication Current Pain Management: Electronic Signature(s) Signed: 04/09/2017 4:37:15 PM By: Alric Quan Entered By: Alric Quan on 04/09/2017 08:51:43 Edward Marquez  ([id number]) -------------------------------------------------------------------------------- Patient/Caregiver Education Details Patient Name: Edward Marquez Date of Service: 04/09/2017 8:45 AM Medical Record Number: [id number] Patient Account Number: [id number] Date of Birth/Gender: 01-Apr-1951 (66 y.o. Male) Treating RN: Carolyne Fiscal, Debi Primary Care Physician: Vic Blackbird Other Clinician: Referring Physician: Vic Blackbird Treating Physician/Extender: Tito Dine in Treatment: 3 Education Assessment Education Provided To: Patient Education Topics Provided Wound/Skin Impairment: Handouts: Other: change dressing as ordered Methods: Demonstration, Explain/Verbal Responses: State content correctly Electronic Signature(s) Signed: 04/09/2017 4:37:15 PM By: Alric Quan Entered By: Alric Quan on 04/09/2017 09:06:24 Edward Marquez ([id number]) -------------------------------------------------------------------------------- Wound Assessment Details Patient Name: Edward Marquez Date of Service: 04/09/2017 8:45 AM Medical Record Number: [id number] Patient Account Number: [id number] Date of Birth/Sex: 1951-02-01 (66 y.o. Male) Treating RN: Carolyne Fiscal, Debi Primary Care Ramondo Dietze: Vic Blackbird Other Clinician: Referring Saida Lonon: Vic Blackbird Treating Jathniel Smeltzer/Extender: Ricard Dillon Weeks in Treatment: 3 Wound Status Wound Number: 1 Primary Etiology: Trauma, Other Wound Location: Left Lower Leg - Anterior Wound Status: Open Wounding Event: Trauma Comorbid History: Arrhythmia, Hypertension, Gout Date Acquired: 02/15/2017 Weeks Of Treatment: 3 Clustered Wound: No Photos Photo Uploaded By: Alric Quan on 04/09/2017 09:24:42 Wound Measurements Length: (cm) 1.5 Width: (cm) 0.5 Depth: (cm) 0.2 Area: (cm) 0.589 Volume: (cm) 0.118 % Reduction in Area: 0% % Reduction in Volume: 0% Epithelialization: None Tunneling: No Undermining: No Wound  Description Full Thickness With Exposed Classification: Support Structures Wound Margin: Epibole Exudate Large Amount: Exudate Type: Serous Exudate Color: amber Foul Odor After Cleansing: No Slough/Fibrino Yes Wound Bed Granulation Amount: None Present (0%) Exposed Structure Necrotic Amount: Large (67-100%) Fascia Exposed: No Necrotic Quality: Adherent Slough Fat Layer (Subcutaneous Tissue) Exposed: Yes Mowrey, Harrold ([id number]) Tendon Exposed: No Muscle Exposed: No Joint Exposed: No Bone Exposed: No Periwound Skin Texture Texture Color No Abnormalities Noted: No No Abnormalities Noted: No Callus: No Atrophie Blanche: No Crepitus: No Cyanosis: No Excoriation: No Ecchymosis: No Induration: No Erythema: Yes Rash: No Erythema Location: Circumferential Scarring: No Hemosiderin Staining: No Mottled: No Moisture Pallor: No No Abnormalities Noted: No Rubor: No Dry / Scaly: No Maceration: No Temperature / Pain Temperature: No Abnormality Tenderness on Palpation: Yes Wound Preparation Ulcer Cleansing: Rinsed/Irrigated with Saline Topical Anesthetic Applied: Other: lidocaine 4%, Treatment Notes Wound #1 (Left, Anterior Lower Leg) 1. Cleansed with: Clean wound with Normal Saline 2. Anesthetic Topical Lidocaine 4% cream to wound bed prior to debridement 3. Peri-wound Care: Skin Prep 4. Dressing Applied: Santyl Ointment 5. Secondary Dressing Applied Bordered Foam Dressing Dry Gauze Electronic Signature(s) Signed: 04/09/2017 4:37:15 PM By: Alric Quan Entered By: Alric Quan on 04/09/2017 08:56:39 Edward Marquez) -------------------------------------------------------------------------------- [facility] Details Patient Name: Edward Marquez Date of Service: 04/09/2017 8:45 AM Medical Record Number: [id number] Patient Account Number: [id number] Date of Birth/Sex: 28-Aug-1950 (66 y.o. Male) Treating RN: Carolyne Fiscal, Debi Primary Care Ras Kollman:  Vic Blackbird Other Clinician: Referring Jerriyah Louis: Vic Blackbird Treating Dawnetta Copenhaver/Extender: Tito Dine in Treatment: 3 Vital Signs Time Taken: 08:51 Temperature (F): 97.8 Height (in): 70 Pulse (bpm): 62 Weight (lbs): 180 Respiratory Rate (breaths/min): 18 Body Mass Index (BMI): 25.8 Blood Pressure (mmHg): 138/53 Reference Range: 80 - 120 mg / dl Electronic Signature(s) Signed: 04/09/2017 4:37:15 PM By: Alric Quan Entered By: Alric Quan on 04/09/2017 08:53:22

## 2017-04-11 NOTE — Progress Notes (Signed)
GARMON, DEHN ([id number]) Visit Report for 04/09/2017 Debridement Details Patient Name: Edward Marquez, Edward Marquez Date of Service: 04/09/2017 8:45 AM Medical Record Number: [id number] Patient Account Number: [id number] Date of Birth/Sex: 30-Nov-1950 (66 y.o. Male) Treating RN: Carolyne Fiscal, Debi Primary Care Provider: Vic Blackbird Other Clinician: Referring Provider: Vic Blackbird Treating Provider/Extender: Tito Dine in Treatment: 3 Debridement Performed for Wound #1 Left,Anterior Lower Leg Assessment: Performed By: Physician Ricard Dillon, MD Debridement: Debridement Pre-procedure Verification/Time Out Yes - 09:04 Taken: Start Time: 09:05 Pain Control: Lidocaine 4% Topical Solution Level: Skin/Subcutaneous Tissue Total Area Debrided (L x 1.5 (cm) x 0.5 (cm) = 0.75 (cm) W): Tissue and other Viable, Non-Viable, Exudate, Fibrin/Slough, Subcutaneous material debrided: Instrument: Curette Bleeding: Minimum Hemostasis Achieved: Pressure End Time: 09:07 Procedural Pain: 0 Post Procedural Pain: 0 Response to Treatment: Procedure was tolerated well Post Debridement Measurements of Total Wound Length: (cm) 1.5 Width: (cm) 0.5 Depth: (cm) 0.3 Volume: (cm) 0.177 Character of Wound/Ulcer Post Requires Further Debridement Debridement: Post Procedure Diagnosis Same as Pre-procedure Electronic Signature(s) Signed: 04/09/2017 4:37:15 PM By: Alric Quan Signed: 04/10/2017 7:49:24 AM By: Linton Ham MD [place], [place] ([id number]) Entered By: Linton Ham on 04/09/2017 09:13:36 Edward Marquez ([id number]) -------------------------------------------------------------------------------- HPI Details Patient Name: Edward Marquez Date of Service: 04/09/2017 8:45 AM Medical Record Number: [id number] Patient Account Number: [id number] Date of Birth/Sex: 07-27-1951 (66 y.o. Male) Treating RN: Ahmed Prima Primary Care Provider: Vic Blackbird Other  Clinician: Referring Provider: Vic Blackbird Treating Provider/Extender: Ricard Dillon Weeks in Treatment: 3 History of Present Illness HPI Description: 03/18/17 patient presents today for initial evaluation concerning an injury which occurred roughly 2 months ago when he was using a tiller at his home. He tells me that he was carrying this when it cut his left anterior shin. He did not go see anyone immediately as he felt that it would just heal on its own but obviously he tells me that's not the case. This ended up red and inflamed surrounding and he was diagnosed with cellulitis at the urgent care when he was seen on 02/19/17. He was started on Augmentin at that time and then had a follow-up wound care check on 03/11/17 at urgent care as well. Fortunately there is no evidence of infection at that point the Bactroban was prescribed for him at that time. He has been using that sense. Patient is unaware of the fact that he had issues with blood flow in his lower extremities bilaterally until all this happened and they began evaluating him at urgent care. Subsequently he has been scheduled for an arterial study with ABI and TBI on 03/15/17 and this will be scheduled for 04/10/17. 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 He states that he has discomfort about one out of 10 at rest described as a sharp/burning sensation that is worsened to four out of 10 with cleansing the wound. Other than those medical conditions patient appears to be in good health and remains physically active even at 66 years of age. 03/26/17 on evaluation today patient appears to be doing fairly well in regard to his left lower extremity wound. The overall appearance is greatly improved although his measurements really have  not shifted at all. He does have an appointment for a vascular evaluation due to his diminished blood flow in  the bilateral lower extremities with associated history of potential vascular claudication. This appointment is scheduled for April 10, 2017 in [place]. Otherwise patient has no significant pain and states that he asked he feels like his "neuropathy" is doing better. 04/02/17; traumatic wound on his left anterior leg about 2-1/2 months ago which has not really made any substantial progression towards healing. The patient has been using Santyl. He has had a venous ultrasound that was negative for DVT. He has arterial studies and apparently a vascular surgeon consultation booked for 04/10/17. The patient has a St. Jude's aortic valve and is on Coumadin. He complains of discomfort predominantly in the plantar aspect of his left foot and lateral 4 toes. This is worse at night and may actually reflect plantar fasciitis. But with ongoing questioning I'm quite convinced that he has claudication approximately 100 yards when walking to his barn from his house aching discomfort in both legs 04/09/17; vascular studies and apparently vascular consultation both booked for tomorrow. He continues to complain of a burning sensation in his left leg which I think may be claudication. We have been using Santyl I think since the beginning of his stay here. He will be traveling on vacation next week we will see him back in 2 weeks Electronic Signature(s) Signed: 04/10/2017 7:49:24 AM By: Linton Ham MD [place], [place] ([id number]) Entered By: Linton Ham on 04/09/2017 09:15:00 Edward Marquez ([id number]) -------------------------------------------------------------------------------- Physical Exam Details Patient Name: Edward Marquez Date of Service: 04/09/2017 8:45 AM Medical Record Number: [id number] Patient Account Number: [id number] Date of Birth/Sex: 1951/01/12 (66 y.o.  Male) Treating RN: Ahmed Prima Primary Care Provider: Vic Blackbird Other Clinician: Referring Provider: Vic Blackbird Treating Provider/Extender: Ricard Dillon Weeks in Treatment: 3 Constitutional Sitting or standing Blood Pressure is within target range for patient.. Pulse regular and within target range for patient.Marland Kitchen Respirations regular, non-labored and within target range.. Temperature is normal and within the target range for the patient.Marland Kitchen appears in no distress. Cardiovascular Pedal pulses absent bilaterally.. Notes Wound exam small oval-shaped wound on the left anterior tibial area. Not much change from last week. Still covered with that tightly adherent necrotic areas. Debrided with a #3 curet and hemostasis with direct pressure. There is no evidence of surrounding infection Electronic Signature(s) Signed: 04/10/2017 7:49:24 AM By: Linton Ham MD Entered By: Linton Ham on 04/09/2017 09:16:17 Edward Marquez ([id number]) -------------------------------------------------------------------------------- Physician Orders Details Patient Name: Edward Marquez Date of Service: 04/09/2017 8:45 AM Medical Record Number: [id number] Patient Account Number: [id number] Date of Birth/Sex: [birth year] (66 y.o. Male) Treating RN: Ahmed Prima Primary Care Provider: Vic Blackbird Other Clinician: Referring Provider: Vic Blackbird Treating Provider/Extender: Tito Dine in Treatment: 3 Verbal / Phone Orders: No Diagnosis Coding Wound Cleansing Wound #1 Left,Anterior Lower Leg o Clean wound with Normal Saline. Anesthetic Wound #1 Left,Anterior Lower Leg o Topical Lidocaine 4% cream applied to wound bed prior to debridement Skin Barriers/Peri-Wound Care Wound #1 Left,Anterior Lower Leg o Skin Prep Primary Wound Dressing Wound #1 Left,Anterior Lower Leg o Santyl Ointment Secondary Dressing Wound #1 Left,Anterior Lower Leg o Dry Gauze o  Boardered Foam Dressing Dressing Change Frequency Wound #1 Left,Anterior Lower Leg o Change dressing every day. Follow-up Appointments Wound #1 Left,Anterior Lower Leg o Return Appointment in 2 weeks. Edema Control Wound #1 Left,Anterior Lower Leg o Elevate legs to the level of the heart and pump ankles as often as possible Additional Orders / Instructions EZEKEIL, BETHEL ([id number]) Wound #1 Left,Anterior Lower Leg o Increase protein intake.   o Other: - Please add vitamin A, vitamin C and zinc supplements to your diet Medications-please add to medication list. Wound #1 Left,Anterior Lower Leg o Santyl Enzymatic Ointment Electronic Signature(s) Signed: 04/09/2017 4:37:15 PM By: Alric Quan Signed: 04/10/2017 7:49:24 AM By: Linton Ham MD Entered By: Alric Quan on 04/09/2017 09:12:51 Edward Marquez ([id number]) -------------------------------------------------------------------------------- Problem List Details Patient Name: Edward Marquez Date of Service: 04/09/2017 8:45 AM Medical Record Number: [id number] Patient Account Number: [id number] Date of Birth/Sex: 01-16-1951 (66 y.o. 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 Male) Treating RN: Ahmed Prima Primary Care Provider: Vic Blackbird Other Clinician: Referring Provider: Vic Blackbird Treating Provider/Extender: Ricard Dillon Weeks in Treatment: 3 Subjective History of Present Illness (HPI) 03/18/17 patient presents today for initial evaluation concerning an injury which occurred roughly 2 months ago when he was using a tiller at his home. He tells me that he was carrying this when it cut his left anterior shin. He did not go see anyone immediately as he felt that it would just heal on its own but obviously he tells me that's not the case. This ended up red and inflamed surrounding and he was diagnosed with cellulitis at the urgent care when he was seen on 02/19/17. He was started on Augmentin at that time and then had a follow-up wound care check on 03/11/17 at urgent care as well. Fortunately there is no evidence of infection at that point the Bactroban was prescribed for him at that time. He has been using that sense. Patient is unaware of the fact that he had issues with blood flow in his lower extremities bilaterally until all this happened and they began evaluating him at urgent care. Subsequently he has been scheduled for an arterial study with ABI and TBI on 03/15/17 and this will be scheduled for 04/10/17. 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 He states that he has discomfort about one out of 10 at rest described as a sharp/burning sensation that  is worsened to four out of 10 with cleansing the wound. Other than those medical conditions patient appears to be in good health and remains physically active even at 66 years of age. 03/26/17 on evaluation today patient appears to be doing fairly well in regard to his left lower extremity wound. The overall appearance is greatly improved although his measurements really have not shifted at all. He does have an appointment for a vascular evaluation due to his diminished blood flow in the bilateral lower extremities with associated history of potential vascular claudication. This appointment  is scheduled for April 10, 2017 in [place]. Otherwise patient has no significant pain and states that he asked he feels like his "neuropathy" is doing better. 04/02/17; traumatic wound on his left anterior leg about 2-1/2 months ago which has not really made any substantial progression towards healing. The patient has been using Santyl. He has had a venous ultrasound that was negative for DVT. He has arterial studies and apparently a vascular surgeon consultation booked for 04/10/17. The patient has a St. Jude's aortic valve and is on Coumadin. He complains of discomfort predominantly in the plantar aspect of his left foot and lateral 4 toes. This is worse at night and may actually reflect plantar fasciitis. But with ongoing questioning I'm quite convinced that he has claudication approximately 100 yards when walking to his barn from his house aching discomfort in both legs 04/09/17; vascular studies and apparently vascular consultation both booked for tomorrow. He continues to complain of a burning sensation in his left leg which I think may be claudication. We have been using Santyl I think since the beginning of his stay here. He will be traveling on vacation next week we will see him back in 2 weeks Dewy Rose, Linley ([id number]) Objective Constitutional Sitting or standing Blood Pressure is  within target range for patient.. Pulse regular and within target range for patient.Marland Kitchen Respirations regular, non-labored and within target range.. Temperature is normal and within the target range for the patient.Marland Kitchen appears in no distress. Vitals Time Taken: 8:51 AM, Height: 70 in, Weight: 180 lbs, BMI: 25.8, Temperature: 97.8 F, Pulse: 62 bpm, Respiratory Rate: 18 breaths/min, Blood Pressure: 138/53 mmHg. Cardiovascular Pedal pulses absent bilaterally.. General Notes: Wound exam small oval-shaped wound on the left anterior tibial area. Not much change from last week. Still covered with that tightly adherent necrotic areas. Debrided with a #3 curet and hemostasis with direct pressure. There is no evidence of surrounding infection Integumentary (Hair, Skin) Wound #1 status is Open. Original cause of wound was Trauma. The wound is located on the Left,Anterior Lower Leg. The wound measures 1.5cm length x 0.5cm width x 0.2cm depth; 0.589cm^2 area and 0.118cm^3 volume. There is Fat Layer (Subcutaneous Tissue) Exposed exposed. There is no tunneling or undermining noted. There is a large amount of serous drainage noted. The wound margin is epibole. There is no granulation within the wound bed. There is a large (67-100%) amount of necrotic tissue within the wound bed including Adherent Slough. The periwound skin appearance exhibited: Erythema. The periwound skin appearance did not exhibit: Callus, Crepitus, Excoriation, Induration, Rash, Scarring, Dry/Scaly, Maceration, Atrophie Blanche, Cyanosis, Ecchymosis, Hemosiderin Staining, Mottled, Pallor, Rubor. The surrounding wound skin color is noted with erythema which is circumferential. Periwound temperature was noted as No Abnormality. The periwound has tenderness on palpation. 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 There was a Skin/Subcutaneous Tissue Debridement ([id number]) debridement with total area of 0.75 sq cm performed by Ricard Dillon, MD. with the following instrument(s): Curette to remove Viable and Non-Viable tissue/material including Exudate, Fibrin/Slough, and Subcutaneous after achieving pain control using Lidocaine 4% Topical Solution. A time out was conducted at  09:04, prior to the start of the procedure. A Minimum amount of bleeding was controlled with Pressure. The procedure was tolerated well with a pain level of 0 throughout and a pain level of 0 following the procedure. Post Debridement Measurements: 1.5cm length x 0.5cm width x 0.3cm depth; 0.177cm^3 volume. Character of Wound/Ulcer Post Debridement requires further debridement. Post procedure Diagnosis Wound #1: Same as Pre-Procedure Plan Wound Cleansing: Wound #1 Left,Anterior Lower Leg: Clean wound with Normal Saline. Anesthetic: Wound #1 Left,Anterior Lower Leg: Topical Lidocaine 4% cream applied to wound bed prior to debridement Skin Barriers/Peri-Wound Care: Wound #1 Left,Anterior Lower Leg: Skin Prep Primary Wound Dressing: Wound #1 Left,Anterior Lower Leg: Santyl Ointment Secondary Dressing: Wound #1 Left,Anterior Lower Leg: Dry Gauze Boardered Foam Dressing Dressing Change Frequency: Wound #1 Left,Anterior Lower Leg: Change dressing every day. Follow-up Appointments: Wound #1 Left,Anterior Lower Leg: Return Appointment in 2 weeks. Edema Control: KABE, MCKOY ([id number]) Wound #1 Left,Anterior Lower Leg: Elevate legs to the level of the heart and pump ankles as often as possible Additional Orders /  Instructions: Wound #1 Left,Anterior Lower Leg: Increase protein intake. Other: - Please add vitamin A, vitamin C and zinc supplements to your diet Medications-please add to medication list.: Wound #1 Left,Anterior Lower Leg: Santyl Enzymatic Ointment #1 continue Santyl change every second day. I have emphasized that he can wash this out in the shower and try and get rid of some the surface eschar #2 I wonder about arterial insufficiency being at the root of all of this including some of the discomfort he has had in his left leg and nonhealing state of his originally traumatic wound. His studies and apparently consultation her tomorrow Electronic Signature(s) Signed: 04/10/2017 7:49:24 AM By: Linton Ham MD Entered By: Linton Ham on 04/09/2017 09:17:43 Edward Marquez ([id number]) -------------------------------------------------------------------------------- [place] Details Patient Name: Edward Marquez Date of Service: 04/09/2017 Medical Record Number: [id number] Patient Account Number: [id number] Date of Birth/Sex: July 05, 1951 (66 y.o. Male) Treating RN: Ahmed Prima Primary Care Provider: Vic Blackbird Other Clinician: Referring Provider: Vic Blackbird Treating Provider/Extender: Ricard Dillon Weeks in Treatment: 3 Diagnosis Coding ICD-10 Codes Code Description [phone] Unspecified open wound, left lower leg, initial encounter I73.9 Peripheral vascular disease, unspecified L97.822 Non-pressure chronic ulcer of other part of left lower leg with fat layer exposed I48.2 Chronic atrial fibrillation Z95.2 Presence of prosthetic heart valve Facility Procedures CPT4: Description Modifier Quantity Code [id number] 11042 - DEB SUBQ TISSUE 20 SQ CM/< 1 ICD-10 Description Diagnosis L97.822 Non-pressure chronic ulcer of other part of left lower leg with fat layer exposed I73.9 Peripheral vascular disease, unspecified Physician Procedures CPT4: Description Modifier Quantity  Code [id number] 10258 - WC PHYS SUBQ TISS 20 SQ CM 1 ICD-10 Description Diagnosis L97.822 Non-pressure chronic ulcer of other part of left lower leg with fat layer exposed I73.9 Peripheral vascular disease, unspecified Electronic Signature(s) Signed: 04/10/2017 7:49:24 AM By: Linton Ham MD Entered By: Linton Ham on 04/09/2017 09:18:15

## 2017-04-12 ENCOUNTER — Other Ambulatory Visit: Payer: Self-pay | Admitting: *Deleted

## 2017-04-23 ENCOUNTER — Encounter: Payer: Medicare Other | Attending: Internal Medicine | Admitting: Internal Medicine

## 2017-04-23 DIAGNOSIS — I739 Peripheral vascular disease, unspecified: Secondary | ICD-10-CM | POA: Insufficient documentation

## 2017-04-23 DIAGNOSIS — L97822 Non-pressure chronic ulcer of other part of left lower leg with fat layer exposed: Secondary | ICD-10-CM | POA: Diagnosis not present

## 2017-04-23 DIAGNOSIS — I10 Essential (primary) hypertension: Secondary | ICD-10-CM | POA: Insufficient documentation

## 2017-04-23 DIAGNOSIS — Z952 Presence of prosthetic heart valve: Secondary | ICD-10-CM | POA: Insufficient documentation

## 2017-04-23 DIAGNOSIS — X58XXXA Exposure to other specified factors, initial encounter: Secondary | ICD-10-CM | POA: Insufficient documentation

## 2017-04-23 DIAGNOSIS — I482 Chronic atrial fibrillation: Secondary | ICD-10-CM | POA: Diagnosis not present

## 2017-04-23 DIAGNOSIS — S81802A Unspecified open wound, left lower leg, initial encounter: Secondary | ICD-10-CM | POA: Insufficient documentation

## 2017-04-23 DIAGNOSIS — M109 Gout, unspecified: Secondary | ICD-10-CM | POA: Diagnosis not present

## 2017-04-23 DIAGNOSIS — Z95810 Presence of automatic (implantable) cardiac defibrillator: Secondary | ICD-10-CM | POA: Diagnosis not present

## 2017-04-24 DIAGNOSIS — R791 Abnormal coagulation profile: Secondary | ICD-10-CM | POA: Diagnosis not present

## 2017-04-24 DIAGNOSIS — Z952 Presence of prosthetic heart valve: Secondary | ICD-10-CM | POA: Diagnosis not present

## 2017-04-24 DIAGNOSIS — I4891 Unspecified atrial fibrillation: Secondary | ICD-10-CM | POA: Diagnosis not present

## 2017-04-24 DIAGNOSIS — Z5181 Encounter for therapeutic drug level monitoring: Secondary | ICD-10-CM | POA: Diagnosis not present

## 2017-04-24 DIAGNOSIS — Z7901 Long term (current) use of anticoagulants: Secondary | ICD-10-CM | POA: Diagnosis not present

## 2017-04-25 NOTE — Progress Notes (Signed)
[name], [name] ([id number]) Visit Report for 04/23/2017 Debridement Details Patient Name: Edward Marquez, Edward Marquez Date of Service: 04/23/2017 8:45 AM Medical Record Number: [id number] Patient Account Number: [id number] Date of Birth/Sex: Oct 03, 1950 (66 y.o. Male) Treating RN: Carolyne Fiscal, Debi Primary Care Provider: Vic Blackbird Other Clinician: Referring Provider: Vic Blackbird Treating Provider/Extender: Tito Dine in Treatment: 5 Debridement Performed for Wound #1 Left,Anterior Lower Leg Assessment: Performed By: Physician Ricard Dillon, MD Debridement: Debridement Pre-procedure Verification/Time Out Yes - 09:34 Taken: Start Time: 09:35 Pain Control: Lidocaine 4% Topical Solution Level: Skin/Subcutaneous Tissue Total Area Debrided (L x 1.5 (cm) x 0.5 (cm) = 0.75 (cm) W): Tissue and other Viable, Non-Viable, Exudate, Fibrin/Slough, Subcutaneous material debrided: Instrument: Curette Bleeding: Minimum Hemostasis Achieved: Pressure End Time: 09:37 Procedural Pain: 0 Post Procedural Pain: 0 Response to Treatment: Procedure was tolerated well Post Debridement Measurements of Total Wound Length: (cm) 1.5 Width: (cm) 0.5 Depth: (cm) 0.2 Volume: (cm) 0.118 Character of Wound/Ulcer Post Requires Further Debridement Debridement: Post Procedure Diagnosis Same as Pre-procedure Electronic Signature(s) Signed: 04/23/2017 3:57:41 PM By: Linton Ham MD Signed: 04/24/2017 4:54:17 PM By: Tilden Fossa, [place] ([id number]) Entered By: Linton Ham on 04/23/2017 09:42:32 Edward Marquez ([id number]) -------------------------------------------------------------------------------- HPI Details Patient Name: Edward Marquez Date of Service: 04/23/2017 8:45 AM Medical Record Number: [id number] Patient Account Number: [id number] Date of Birth/Sex: [birth year] (66 y.o. Male) Treating RN: Ahmed Prima Primary Care Provider: Vic Blackbird Other  Clinician: Referring Provider: Vic Blackbird Treating Provider/Extender: Ricard Dillon Weeks in Treatment: 5 History of Present Illness HPI Description: 03/18/17 patient presents today for initial evaluation concerning an injury which occurred roughly 2 months ago when he was using a tiller at his home. He tells me that he was carrying this when it cut his left anterior shin. He did not go see anyone immediately as he felt that it would just heal on its own but obviously he tells me that's not the case. This ended up red and inflamed surrounding and he was diagnosed with cellulitis at the urgent care when he was seen on 02/19/17. He was started on Augmentin at that time and then had a follow-up wound care check on 03/11/17 at urgent care as well. Fortunately there is no evidence of infection at that point the Bactroban was prescribed for him at that time. He has been using that sense. Patient is unaware of the fact that he had issues with blood flow in his lower extremities bilaterally until all this happened and they began evaluating him at urgent care. Subsequently he has been scheduled for an arterial study with ABI and TBI on 03/15/17 and this will be scheduled for 04/10/17. 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 He states that he has discomfort about one out of 10 at rest described as a sharp/burning sensation that is worsened to four out of 10 with cleansing the wound. Other than those medical conditions patient appears to be in good health and remains physically active even at 66 years of age. 03/26/17 on evaluation today patient appears to be doing fairly well in regard to his left lower extremity wound. The overall appearance is greatly improved although his measurements really have  not shifted at all. He does have an appointment for a vascular evaluation due to his diminished blood flow in  the bilateral lower extremities with associated history of potential vascular claudication. This appointment is scheduled for April 10, 2017 in [place]. Otherwise patient has no significant pain and states that he asked he feels like his "neuropathy" is doing better. 04/02/17; traumatic wound on his left anterior leg about 2-1/2 months ago which has not really made any substantial progression towards healing. The patient has been using Santyl. He has had a venous ultrasound that was negative for DVT. He has arterial studies and apparently a vascular surgeon consultation booked for 04/10/17. The patient has a St. Jude's aortic valve and is on Coumadin. He complains of discomfort predominantly in the plantar aspect of his left foot and lateral 4 toes. This is worse at night and may actually reflect plantar fasciitis. But with ongoing questioning I'm quite convinced that he has claudication approximately 100 yards when walking to his barn from his house aching discomfort in both legs 04/09/17; vascular studies and apparently vascular consultation both booked for tomorrow. He continues to complain of a burning sensation in his left leg which I think may be claudication. We have been using Santyl I think since the beginning of his stay here. He will be traveling on vacation next week we will see him back in 2 weeks 04/23/17; Edward Marquez saw Dr. Doren Custard of vascular surgery. His arterial studies showed a right ABI of 1.01. Noncompressible on the left side. There was no flow on the left great toe and sick TBI could not be [place], Zoe ([id number]) obtained. TBI in the right is quoted at 0.09. He is apparently going for a arteriogram on 05/01/17. He has been using Santyl to the wound. He is using Santyl to the wound Electronic Signature(s) Signed: 04/23/2017 3:57:41 PM By: Linton Ham MD Entered By: Linton Ham on 04/23/2017 09:45:41 Edward Marquez ([id number]) -------------------------------------------------------------------------------- Physical Exam Details Patient Name: Edward Marquez Date of Service: 04/23/2017 8:45 AM Medical Record Number: [id number] Patient Account Number: [id number] Date of Birth/Sex: 12-21-1950 (66 y.o. Male) Treating RN: Ahmed Prima Primary Care Provider: Vic Blackbird Other Clinician: Referring Provider: Vic Blackbird Treating Provider/Extender: Ricard Dillon Weeks in Treatment: 5 Constitutional Sitting or standing Blood Pressure is within target range for patient.. Pulse regular and within target range for patient.Marland Kitchen Respirations regular, non-labored and within target range.. Temperature is normal and within the target range for the patient.Marland Kitchen appears in no distress. Notes Exam; oval-shaped wound on the left anterior tibial area. Some improvement in the adherent necrotic material. Nevertheless using a #3 curet debridement of this area. There is bleeding hemostasis with direct pressure no evidence of surrounding infection. Electronic Signature(s) Signed: 04/23/2017 3:57:41 PM By: Linton Ham MD Entered By: Linton Ham on 04/23/2017 09:46:34 Edward Marquez ([id number]) -------------------------------------------------------------------------------- Physician Orders Details Patient Name: Edward Marquez Date of Service: 04/23/2017 8:45 AM Medical Record Number: [id number] Patient Account Number: [id number] Date of Birth/Sex: 04/03/1951 (66 y.o. Male) Treating RN: Ahmed Prima Primary Care Provider: Vic Blackbird Other Clinician: Referring Provider: Vic Blackbird Treating Provider/Extender: Tito Dine in Treatment: 5 Verbal / Phone Orders: Yes ClinicianCarolyne Fiscal, Debi Read Back and Verified: Yes Diagnosis Coding Wound Cleansing Wound #1 Left,Anterior Lower Leg o Clean wound with  Normal Saline. Anesthetic Wound #1 Left,Anterior Lower Leg o Topical Lidocaine 4% cream applied to wound bed prior to debridement Skin Barriers/Peri-Wound Care Wound #1 Left,Anterior Lower Leg o Skin Prep Primary Wound Dressing Wound #1 Left,Anterior Lower Leg o Santyl Ointment Secondary Dressing Wound #1 Left,Anterior Lower Leg o Dry Gauze   o Boardered Foam Dressing Dressing Change Frequency Wound #1 Left,Anterior Lower Leg o Change dressing every day. Follow-up Appointments Wound #1 Left,Anterior Lower Leg o Return Appointment in 2 weeks. Edema Control Wound #1 Left,Anterior Lower Leg o Elevate legs to the level of the heart and pump ankles as often as possible Additional Orders / Instructions Edward Marquez, Edward Marquez ([id number]) Wound #1 Left,Anterior Lower Leg o Increase protein intake. o Other: - Please add vitamin A, vitamin C and zinc supplements to your diet Medications-please add to medication list. Wound #1 Left,Anterior Lower Leg o Santyl Enzymatic Ointment Electronic Signature(s) Signed: 04/23/2017 3:57:41 PM By: Linton Ham MD Signed: 04/24/2017 4:54:17 PM By: Alric Quan Entered By: Alric Quan on 04/23/2017 Edward Marquez, Edward Marquez ([id number]) -------------------------------------------------------------------------------- Problem List Details Patient Name: Edward Marquez Date of Service: 04/23/2017 8:45 AM Medical Record Number: [id number] Patient Account Number: [id number] Date of Birth/Sex: [birth year] (66 y.o. 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 Male) Treating RN: Ahmed Prima Primary Care Provider: Vic Blackbird Other Clinician: Referring Provider: Vic Blackbird Treating Provider/Extender: Ricard Dillon Weeks in Treatment: 5 Subjective History of Present Illness (HPI) 03/18/17 patient presents today for initial evaluation concerning an injury which occurred roughly 2 months ago when he was using a tiller at his home. He tells me that he was carrying this when it cut his left anterior shin. He did not go see anyone immediately as he felt that it would just heal on its own but obviously he tells me that's not the case. This ended up red and inflamed surrounding and he was diagnosed with cellulitis at the urgent care when he was seen on 02/19/17. He was started on Augmentin at that time and then had a follow-up wound care check on 03/11/17 at urgent care as well. Fortunately there is no evidence of infection at that point the Bactroban was prescribed for him at that time. He has been using that sense. Patient is unaware of the fact that he had issues with blood flow in his lower extremities bilaterally until all this happened and they began evaluating him at urgent care. Subsequently he has been scheduled for an arterial study with ABI and TBI on 03/15/17 and this will be  scheduled for 04/10/17. 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 He states that he has discomfort about one out of 10 at rest described as a sharp/burning sensation that is worsened to four out of 10 with cleansing the wound. Other than those medical conditions patient appears to be in good health and remains physically active  even at 66 years of age. 03/26/17 on evaluation today patient appears to be doing fairly well in regard to his left lower extremity wound. The overall appearance is greatly improved although his measurements really have not shifted at all. He does have an appointment for a vascular evaluation due to his diminished blood flow in the bilateral lower extremities with associated history of potential vascular claudication. This appointment is scheduled for April 10, 2017 in [place]. Otherwise patient has no significant pain and states that he asked he feels like his "neuropathy" is doing better. 04/02/17; traumatic wound on his left anterior leg about 2-1/2 months ago which has not really made any substantial progression towards healing. The patient has been using Santyl. He has had a venous ultrasound that was negative for DVT. He has arterial studies and apparently a vascular surgeon consultation booked for 04/10/17. The patient has a St. Jude's aortic valve and is on Coumadin. He complains of discomfort predominantly in the plantar aspect of his left foot and lateral 4 toes. This is worse at night and may actually reflect plantar fasciitis. But with ongoing questioning I'm quite convinced that he has claudication approximately 100 yards when walking to his barn from his house aching discomfort in both legs 04/09/17; vascular studies and apparently vascular  consultation both booked for tomorrow. He continues to complain of a burning sensation in his left leg which I think may be claudication. We have been using Santyl I think since the beginning of his stay here. He will be traveling on vacation next week we will see him back in 2 weeks 04/23/17; Edward Marquez saw Dr. Doren Custard of vascular surgery. His arterial studies showed a right ABI of 1.01. Noncompressible on the left side. There was no flow on the left great toe and sick TBI could not be obtained. TBI in the right is quoted at 0.09. He is apparently going for a arteriogram on 05/01/17. He has [place], Edward ([id number]) been using Santyl to the wound. He is using Santyl to the wound Objective Constitutional Sitting or standing Blood Pressure is within target range for patient.. Pulse regular and within target range for patient.Marland Kitchen Respirations regular, non-labored and within target range.. Temperature is normal and within the target range for the patient.Marland Kitchen appears in no distress. Vitals Time Taken: 9:07 AM, Height: 70 in, Weight: 180 lbs, BMI: 25.8, Temperature: 97.6 F, Pulse: 70 bpm, Respiratory Rate: 18 breaths/min, Blood Pressure: 120/90 mmHg. General Notes: Exam; oval-shaped wound on the left anterior tibial area. Some improvement in the adherent necrotic material. Nevertheless using a #3 curet debridement of this area. There is bleeding hemostasis with direct pressure no evidence of surrounding infection. Integumentary (Hair, Skin) Wound #1 status is Open. Original cause of wound was Trauma. The wound is located on the Left,Anterior Lower Leg. The wound measures 1.5cm length x 0.5cm width x 0.2cm depth; 0.589cm^2 area and 0.118cm^3 volume. There is Fat Layer (Subcutaneous Tissue) Exposed exposed. There is no tunneling or undermining noted. There is a large amount of serous drainage noted. The wound margin is epibole. There is small (1-33%) red granulation within the wound bed. There is a  large (67-100%) amount of necrotic tissue within the wound bed including Adherent Slough. The periwound skin appearance exhibited: Erythema. The periwound skin appearance did not exhibit: Callus, Crepitus, Excoriation, Induration, Rash, Scarring, Dry/Scaly, Maceration, Atrophie Blanche, Cyanosis, Ecchymosis, Hemosiderin Staining, Mottled, Pallor, Rubor. The surrounding wound skin color is noted with erythema which is circumferential. Periwound temperature was  noted as No Abnormality. The periwound has tenderness on palpation. 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 There was a Skin/Subcutaneous Tissue Debridement ([id number]) debridement with total area of 0.75 sq cm performed by Ricard Dillon, MD. with the following instrument(s): Curette to remove Viable and Non-Viable tissue/material including Exudate, Fibrin/Slough, and Subcutaneous after achieving pain control using Lidocaine 4% Topical Solution. A time out was conducted at 09:34, prior to the start of the procedure. A Minimum amount of bleeding was controlled with Pressure. The procedure was tolerated well with a pain level of 0 throughout and a pain level of 0 following the procedure. Post Debridement Measurements: 1.5cm length x 0.5cm width x 0.2cm depth; 0.118cm^3 volume. Character of Wound/Ulcer Post Debridement requires further debridement. Post procedure Diagnosis Wound #1: Same as Pre-Procedure Plan Wound Cleansing: Wound #1 Left,Anterior Lower Leg: Clean wound with Normal Saline. Anesthetic: Wound  #1 Left,Anterior Lower Leg: Topical Lidocaine 4% cream applied to wound bed prior to debridement Skin Barriers/Peri-Wound Care: Wound #1 Left,Anterior Lower Leg: Skin Prep Primary Wound Dressing: Wound #1 Left,Anterior Lower Leg: Santyl Ointment Secondary Dressing: Wound #1 Left,Anterior Lower Leg: Dry Gauze Boardered Foam Dressing Dressing Change Frequency: Wound #1 Left,Anterior Lower Leg: Change dressing every day. Follow-up Appointments: Wound #1 Left,Anterior Lower Leg: Return Appointment in 2 weeks. Edward Marquez, Edward Marquez ([id number]) Edema Control: Wound #1 Left,Anterior Lower Leg: Elevate legs to the level of the heart and pump ankles as often as possible Additional Orders / Instructions: Wound #1 Left,Anterior Lower Leg: Increase protein intake. Other: - Please add vitamin A, vitamin C and zinc supplements to your diet Medications-please add to medication list.: Wound #1 Left,Anterior Lower Leg: Santyl Enzymatic Ointment 1 continue santyl 2 arteriogram next week, I will review Dr. Mee Hives note Electronic Signature(s) Signed: 04/23/2017 3:57:41 PM By: Linton Ham MD Entered By: Linton Ham on 04/23/2017 09:47:59 Edward Marquez ([id number]) -------------------------------------------------------------------------------- [place] Details Patient Name: Edward Marquez Date of Service: 04/23/2017 Medical Record Number: [id number] Patient Account Number: [id number] Date of Birth/Sex: 01/14/1951 (66 y.o. Male) Treating RN: Ahmed Prima Primary Care Provider: Vic Blackbird Other Clinician: Referring Provider: Vic Blackbird Treating Provider/Extender: Ricard Dillon Weeks in Treatment: 5 Diagnosis Coding ICD-10 Codes Code Description [phone] Unspecified open wound, left lower leg, initial encounter I73.9 Peripheral vascular disease, unspecified L97.822 Non-pressure chronic ulcer of other part of left lower leg with fat layer exposed I48.2 Chronic atrial  fibrillation Z95.2 Presence of prosthetic heart valve Facility Procedures CPT4: Description Modifier Quantity Code [id number] 11042 - DEB SUBQ TISSUE 20 SQ CM/< 1 ICD-10 Description Diagnosis S81.802A Unspecified open wound, left lower leg, initial encounter L97.822 Non-pressure chronic ulcer of other part of left lower leg with  fat layer exposed Physician Procedures CPT4: Description Modifier Quantity Code [id number] 91505 - WC PHYS SUBQ TISS 20 SQ CM 1 ICD-10 Description Diagnosis S81.802A Unspecified open wound, left lower leg, initial encounter W97.948 Non-pressure chronic ulcer of other part of left lower leg with  fat layer exposed Electronic Signature(s) Signed: 04/23/2017 3:57:41 PM By: Linton Ham MD Entered By: Linton Ham on 04/23/2017 09:48:32

## 2017-04-25 NOTE — Progress Notes (Signed)
CAYDYN, SPRUNG ([id number]) Visit Report for 04/23/2017 Arrival Information Details Patient Name: NOLIN, GRELL Date of Service: 04/23/2017 8:45 AM Medical Record Number: [id number] Patient Account Number: [id number] Date of Birth/Sex: 22-Mar-1951 (66 y.o. Male) Treating RN: Ahmed Prima Primary Care Alverto Shedd: Vic Blackbird Other Clinician: Referring Manha Amato: Vic Blackbird Treating Mayanna Garlitz/Extender: Tito Dine in Treatment: 5 Visit Information History Since Last Visit All ordered tests and consults were completed: No Patient Arrived: Ambulatory Added or deleted any medications: No Arrival Time: 09:05 Any new allergies or adverse reactions: No Accompanied By: self Had a fall or experienced change in No Transfer Assistance: None activities of daily living that may affect Patient Identification Verified: Yes risk of falls: Secondary Verification Process Yes Signs or symptoms of abuse/neglect since last No Completed: visito Patient Requires Transmission- No Hospitalized since last visit: No Based Precautions: Has Dressing in Place as Prescribed: Yes Patient Has Alerts: Yes Pain Present Now: No Patient Alerts: Patient on Blood Thinner ABI [place] BILATERAL >220 Coumadin Electronic Signature(s) Signed: 04/24/2017 4:54:17 PM By: Alric Quan Entered By: Alric Quan on 04/23/2017 09:07:34 [name] ([id number]) -------------------------------------------------------------------------------- Encounter Discharge Information Details Patient Name: [name] Date of Service: 04/23/2017 8:45 AM Medical Record Number: [id number] Patient Account Number: [id number] Date of Birth/Sex: 1951-03-12 (66 y.o. Male) Treating RN: Ahmed Prima Primary Care Deserae Jennings: Vic Blackbird Other Clinician: Referring Lailyn Appelbaum: Vic Blackbird Treating Kleo Dungee/Extender: Tito Dine in Treatment: 5 Encounter Discharge Information Items Discharge Pain Level:  0 Discharge Condition: Stable Ambulatory Status: Ambulatory Discharge Destination: Home Transportation: Private Auto Accompanied By: self Schedule Follow-up Appointment: Yes Medication Reconciliation completed and provided to Patient/Care No Wiley Flicker: Provided on Clinical Summary of Care: 04/23/2017 Form Type Recipient Paper Patient BW Electronic Signature(s) Signed: 04/24/2017 4:54:17 PM By: Alric Quan Entered By: Alric Quan on 04/23/2017 09:43:23 [name] ([id number]) -------------------------------------------------------------------------------- Lower Extremity Assessment Details Patient Name: [name] Date of Service: 04/23/2017 8:45 AM Medical Record Number: [id number] Patient Account Number: [id number] Date of Birth/Sex: 08-Jun-1951 (66 y.o. Male) Treating RN: Ahmed Prima Primary Care Taziah Difatta: Vic Blackbird Other Clinician: Referring Traycen Goyer: Vic Blackbird Treating Larkin Morelos/Extender: Ricard Dillon Weeks in Treatment: 5 Vascular Assessment Pulses: Dorsalis Pedis Palpable: [Left:Yes] Posterior Tibial Extremity colors, hair growth, and conditions: Extremity Color: [Left:Red] Temperature of Extremity: [Left:Warm] Capillary Refill: [Left:< 3 seconds] Toe Nail Assessment Left: Right: Thick: No Discolored: No Deformed: No Improper Length and Hygiene: No Electronic Signature(s) Signed: 04/24/2017 4:54:17 PM By: Alric Quan Entered By: Alric Quan on 04/23/2017 [place], [place] ([id number]) -------------------------------------------------------------------------------- Multi Wound Chart Details Patient Name: [name] Date of Service: 04/23/2017 8:45 AM Medical Record Number: [id number] Patient Account Number: [id number] Date of Birth/Sex: 1951/05/31 (66 y.o. Male) Treating RN: Carolyne Fiscal, Debi Primary Care Johntae Broxterman: Vic Blackbird Other Clinician: Referring Breindy Meadow: Vic Blackbird Treating Quina Wilbourne/Extender:  Ricard Dillon Weeks in Treatment: 5 Vital Signs Height(in): 70 Pulse(bpm): 70 Weight(lbs): 180 Blood Pressure 120/90 (mmHg): Body Mass Index(BMI): 26 Temperature(F): 97.6 Respiratory Rate 18 (breaths/min): Photos: [1:No Photos] [N/A:N/A] Wound Location: [1:Left Lower Leg - Anterior N/A] Wounding Event: [1:Trauma] [N/A:N/A] Primary Etiology: [1:Trauma, Other] [N/A:N/A] Comorbid History: [1:Arrhythmia, Hypertension, N/A Gout] Date Acquired: [1:02/15/2017] [N/A:N/A] Weeks of Treatment: [1:5] [N/A:N/A] Wound Status: [1:Open] [N/A:N/A] Measurements L x W x D 1.5x0.5x0.2 [N/A:N/A] (cm) Area (cm) : [1:0.589] [N/A:N/A] Volume (cm) : [1:0.118] [N/A:N/A] % Reduction in Area: [1:0.00%] [N/A:N/A] % Reduction in Volume: 0.00% [N/A:N/A] Classification: [1:Full Thickness With Exposed Support Structures] [N/A:N/A] Exudate Amount: [1:Large] [N/A:N/A] Exudate Type: [1:Serous] [N/A:N/A] Exudate Color: [1:amber] [N/A:N/A] Wound Margin: [1:Epibole] [  N/A:N/A] Granulation Amount: [1:Small (1-33%)] [N/A:N/A] Granulation Quality: [1:Red] [N/A:N/A] Necrotic Amount: [1:Large (67-100%)] [N/A:N/A] Exposed Structures: [1:Fat Layer (Subcutaneous N/A Tissue) Exposed: Yes Fascia: No Tendon: No Muscle: No] Joint: No Bone: No Epithelialization: None N/A N/A Debridement: Debridement ([id number]- N/A N/A 11047) Pre-procedure 09:34 N/A N/A Verification/Time Out Taken: Pain Control: Lidocaine 4% Topical N/A N/A Solution Tissue Debrided: Fibrin/Slough, Exudates, N/A N/A Subcutaneous Level: Skin/Subcutaneous N/A N/A Tissue Debridement Area (sq 0.75 N/A N/A cm): Instrument: Curette N/A N/A Bleeding: Minimum N/A N/A Hemostasis Achieved: Pressure N/A N/A Procedural Pain: 0 N/A N/A Post Procedural Pain: 0 N/A N/A Debridement Treatment Procedure was tolerated N/A N/A Response: well Post Debridement 1.5x0.5x0.2 N/A N/A Measurements L x W x D (cm) Post Debridement 0.118 N/A N/A Volume:  (cm) Periwound Skin Texture: Excoriation: No N/A N/A Induration: No Callus: No Crepitus: No Rash: No Scarring: No Periwound Skin Maceration: No N/A N/A Moisture: Dry/Scaly: No Periwound Skin Color: Erythema: Yes N/A N/A Atrophie Blanche: No Cyanosis: No Ecchymosis: No Hemosiderin Staining: No Mottled: No Pallor: No Rubor: No Erythema Location: Circumferential N/A N/A Temperature: No Abnormality N/A N/A Tenderness on Yes N/A N/A Palpation: Wound Preparation: Ulcer Cleansing: N/A N/A Rinsed/Irrigated with [name] ([id number]) Saline Topical Anesthetic Applied: Other: lidocaine 4% Procedures Performed: Debridement N/A N/A Treatment Notes Electronic Signature(s) Signed: 04/23/2017 3:57:41 PM By: Linton Ham MD Entered By: Linton Ham on 04/23/2017 [place], [place] ([id number]) -------------------------------------------------------------------------------- [place] Details Patient Name: [name] Date of Service: 04/23/2017 8:45 AM Medical Record Number: [id number] Patient Account Number: [id number] Date of Birth/Sex: 05-24-1951 (66 y.o. Male) Treating RN: Ahmed Prima Primary Care Annalyn Blecher: Vic Blackbird Other Clinician: Referring Corgan Mormile: Vic Blackbird Treating Shaunika Italiano/Extender: Tito Dine in Treatment: 5 Active Inactive ` Orientation to the Wound Care Program Nursing Diagnoses: Knowledge deficit related to the wound healing center program Goals: Patient/caregiver will verbalize understanding of the [facility] Program Date Initiated: 03/18/2017 Target Resolution Date: 05/24/2017 Goal Status: Active Interventions: Provide education on orientation to the wound center Notes: ` Wound/Skin Impairment Nursing Diagnoses: Impaired tissue integrity Goals: Ulcer/skin breakdown will have a volume reduction of 30% by week 4 Date Initiated: 03/18/2017 Target Resolution Date: 05/24/2017 Goal  Status: Active Ulcer/skin breakdown will have a volume reduction of 50% by week 8 Date Initiated: 03/18/2017 Target Resolution Date: 05/24/2017 Goal Status: Active Ulcer/skin breakdown will have a volume reduction of 80% by week 12 Date Initiated: 03/18/2017 Target Resolution Date: 05/24/2017 Goal Status: Active Ulcer/skin breakdown will heal within 14 weeks Date Initiated: 03/18/2017 Target Resolution Date: 05/24/2017 Goal Status: Active Interventions: ADRON, GEISEL ([id number]) Assess patient/caregiver ability to obtain necessary supplies Assess patient/caregiver ability to perform ulcer/skin care regimen upon admission and as needed Assess ulceration(s) every visit Notes: Electronic Signature(s) Signed: 04/24/2017 4:54:17 PM By: Alric Quan Entered By: Alric Quan on 04/23/2017 09:15:04 [name] ([id number]) -------------------------------------------------------------------------------- Pain Assessment Details Patient Name: [name] Date of Service: 04/23/2017 8:45 AM Medical Record Number: [id number] Patient Account Number: [id number] Date of Birth/Sex: 1951-03-26 (65 y.o. Male) Treating RN: Ahmed Prima Primary Care Quintessa Simmerman: Vic Blackbird Other Clinician: Referring Marvelyn Bouchillon: Vic Blackbird Treating Veria Stradley/Extender: Ricard Dillon Weeks in Treatment: 5 Active Problems Location of Pain Severity and Description of Pain Patient Has Paino No Site Locations Pain Management and Medication Current Pain Management: Electronic Signature(s) Signed: 04/24/2017 4:54:17 PM By: Alric Quan Entered By: Alric Quan on 04/23/2017 09:07:39 [name] ([id number]) -------------------------------------------------------------------------------- Patient/Caregiver Education Details Patient Name: [name] Date of Service: 04/23/2017 8:45 AM Medical Record Number: [id number]  Patient Account Number: [id number] Date of Birth/Gender: 01-11-1951 (66 y.o.  Male) Treating RN: Carolyne Fiscal, Debi Primary Care Physician: Vic Blackbird Other Clinician: Referring Physician: Vic Blackbird Treating Physician/Extender: Tito Dine in Treatment: 5 Education Assessment Education Provided To: Patient Education Topics Provided Wound/Skin Impairment: Handouts: Other: change dressing as ordered Methods: Demonstration, Explain/Verbal Responses: State content correctly Electronic Signature(s) Signed: 04/24/2017 4:54:17 PM By: Alric Quan Entered By: Alric Quan on 04/23/2017 09:43:36 [name] ([id number]) -------------------------------------------------------------------------------- Wound Assessment Details Patient Name: [name] Date of Service: 04/23/2017 8:45 AM Medical Record Number: [id number] Patient Account Number: [id number] Date of Birth/Sex: [birth year] (66 y.o. Male) Treating RN: Carolyne Fiscal, Debi Primary Care Skye Rodarte: Vic Blackbird Other Clinician: Referring Willene Holian: Vic Blackbird Treating Fe Okubo/Extender: Ricard Dillon Weeks in Treatment: 5 Wound Status Wound Number: 1 Primary Etiology: Trauma, Other Wound Location: Left Lower Leg - Anterior Wound Status: Open Wounding Event: Trauma Comorbid History: Arrhythmia, Hypertension, Gout Date Acquired: 02/15/2017 Weeks Of Treatment: 5 Clustered Wound: No Photos Photo Uploaded By: Gretta Cool, BSN, RN, CWS, Kim on 04/23/2017 15:58:48 Wound Measurements Length: (cm) 1.5 Width: (cm) 0.5 Depth: (cm) 0.2 Area: (cm) 0.589 Volume: (cm) 0.118 % Reduction in Area: 0% % Reduction in Volume: 0% Epithelialization: None Tunneling: No Undermining: No Wound Description Full Thickness With Exposed Classification: Support Structures Wound Margin: Epibole Exudate Large Amount: Exudate Type: Serous Exudate Color: amber Foul Odor After Cleansing: No Slough/Fibrino Yes Wound Bed Granulation Amount: Small (1-33%) Exposed Structure Granulation  Quality: Red Fascia Exposed: No Necrotic Amount: Large (67-100%) Fat Layer (Subcutaneous Tissue) Exposed: Yes Sobieski, Sedrick ([id number]) Necrotic Quality: Adherent Slough Tendon Exposed: No Muscle Exposed: No Joint Exposed: No Bone Exposed: No Periwound Skin Texture Texture Color No Abnormalities Noted: No No Abnormalities Noted: No Callus: No Atrophie Blanche: No Crepitus: No Cyanosis: No Excoriation: No Ecchymosis: No Induration: No Erythema: Yes Rash: No Erythema Location: Circumferential Scarring: No Hemosiderin Staining: No Mottled: No Moisture Pallor: No No Abnormalities Noted: No Rubor: No Dry / Scaly: No Maceration: No Temperature / Pain Temperature: No Abnormality Tenderness on Palpation: Yes Wound Preparation Ulcer Cleansing: Rinsed/Irrigated with Saline Topical Anesthetic Applied: Other: lidocaine 4%, Treatment Notes Wound #1 (Left, Anterior Lower Leg) 1. Cleansed with: Clean wound with Normal Saline 4. Dressing Applied: Santyl Ointment 5. Secondary Dressing Applied Dry [place] Signature(s) Signed: 04/24/2017 4:54:17 PM By: Alric Quan Entered By: Alric Quan on 04/23/2017 09:11:56 [name] ([id number]) -------------------------------------------------------------------------------- Erin Details Patient Name: [name] Date of Service: 04/23/2017 8:45 AM Medical Record Number: [id number] Patient Account Number: [id number] Date of Birth/Sex: June 25, 1951 (66 y.o. Male) Treating RN: Carolyne Fiscal, Debi Primary Care Broughton Eppinger: Vic Blackbird Other Clinician: Referring Stiven Kaspar: Vic Blackbird Treating Raylee Strehl/Extender: Tito Dine in Treatment: 5 Vital Signs Time Taken: 09:07 Temperature (F): 97.6 Height (in): 70 Pulse (bpm): 70 Weight (lbs): 180 Respiratory Rate (breaths/min): 18 Body Mass Index (BMI): 25.8 Blood Pressure (mmHg): 120/90 Reference Range: 80 - 120 mg / dl Electronic  Signature(s) Signed: 04/24/2017 4:54:17 PM By: Alric Quan Entered By: Alric Quan on 04/23/2017 09:08:07

## 2017-04-26 DIAGNOSIS — Z7901 Long term (current) use of anticoagulants: Secondary | ICD-10-CM | POA: Diagnosis not present

## 2017-04-26 DIAGNOSIS — Z952 Presence of prosthetic heart valve: Secondary | ICD-10-CM | POA: Diagnosis not present

## 2017-04-26 DIAGNOSIS — R791 Abnormal coagulation profile: Secondary | ICD-10-CM | POA: Diagnosis not present

## 2017-04-26 DIAGNOSIS — Z5181 Encounter for therapeutic drug level monitoring: Secondary | ICD-10-CM | POA: Diagnosis not present

## 2017-04-26 DIAGNOSIS — I4891 Unspecified atrial fibrillation: Secondary | ICD-10-CM | POA: Diagnosis not present

## 2017-04-30 ENCOUNTER — Telehealth: Payer: Self-pay | Admitting: Vascular Surgery

## 2017-04-30 ENCOUNTER — Encounter (HOSPITAL_COMMUNITY): Payer: Self-pay | Admitting: *Deleted

## 2017-04-30 ENCOUNTER — Inpatient Hospital Stay (HOSPITAL_COMMUNITY)
Admission: EM | Admit: 2017-04-30 | Discharge: 2017-05-09 | DRG: 983 | Disposition: A | Discharge status: Home / Self Care | Payer: Medicare Other | Attending: Vascular Surgery | Admitting: Vascular Surgery

## 2017-04-30 DIAGNOSIS — L03116 Cellulitis of left lower limb: Principal | ICD-10-CM | POA: Diagnosis present

## 2017-04-30 DIAGNOSIS — Z23 Encounter for immunization: Secondary | ICD-10-CM | POA: Diagnosis not present

## 2017-04-30 DIAGNOSIS — M79605 Pain in left leg: Secondary | ICD-10-CM | POA: Diagnosis not present

## 2017-04-30 DIAGNOSIS — Z79899 Other long term (current) drug therapy: Secondary | ICD-10-CM

## 2017-04-30 DIAGNOSIS — I110 Hypertensive heart disease with heart failure: Secondary | ICD-10-CM | POA: Diagnosis not present

## 2017-04-30 DIAGNOSIS — I70209 Unspecified atherosclerosis of native arteries of extremities, unspecified extremity: Secondary | ICD-10-CM | POA: Diagnosis not present

## 2017-04-30 DIAGNOSIS — S81802A Unspecified open wound, left lower leg, initial encounter: Secondary | ICD-10-CM | POA: Diagnosis not present

## 2017-04-30 DIAGNOSIS — I4891 Unspecified atrial fibrillation: Secondary | ICD-10-CM | POA: Diagnosis not present

## 2017-04-30 DIAGNOSIS — I509 Heart failure, unspecified: Secondary | ICD-10-CM | POA: Diagnosis present

## 2017-04-30 DIAGNOSIS — Z91041 Radiographic dye allergy status: Secondary | ICD-10-CM | POA: Diagnosis not present

## 2017-04-30 DIAGNOSIS — I872 Venous insufficiency (chronic) (peripheral): Secondary | ICD-10-CM | POA: Diagnosis not present

## 2017-04-30 DIAGNOSIS — Z95828 Presence of other vascular implants and grafts: Secondary | ICD-10-CM

## 2017-04-30 DIAGNOSIS — I739 Peripheral vascular disease, unspecified: Secondary | ICD-10-CM | POA: Diagnosis not present

## 2017-04-30 DIAGNOSIS — Z952 Presence of prosthetic heart valve: Secondary | ICD-10-CM | POA: Diagnosis not present

## 2017-04-30 DIAGNOSIS — Z7901 Long term (current) use of anticoagulants: Secondary | ICD-10-CM

## 2017-04-30 DIAGNOSIS — R609 Edema, unspecified: Secondary | ICD-10-CM | POA: Diagnosis not present

## 2017-04-30 DIAGNOSIS — I70222 Atherosclerosis of native arteries of extremities with rest pain, left leg: Secondary | ICD-10-CM | POA: Diagnosis not present

## 2017-04-30 HISTORY — DX: Peripheral vascular disease, unspecified: I73.9

## 2017-04-30 LAB — BASIC METABOLIC PANEL
Anion gap: 6 (ref 5–15)
BUN: 15 mg/dL (ref 6–20)
CHLORIDE: 103 mmol/L (ref 101–111)
CO2: 26 mmol/L (ref 22–32)
Calcium: 9.2 mg/dL (ref 8.9–10.3)
Creatinine, Ser: 0.87 mg/dL (ref 0.61–1.24)
GFR calc Af Amer: >60 mL/min (ref >60)
GLUCOSE: 101 mg/dL — AB (ref 65–99)
POTASSIUM: 4.4 mmol/L (ref 3.5–5.1)
Sodium: 135 mmol/L (ref 135–145)

## 2017-04-30 LAB — PROTIME-INR
INR: 2.21
Prothrombin Time: 24.3 seconds — ABNORMAL HIGH (ref 11.4–15.2)

## 2017-04-30 LAB — CBC WITH DIFFERENTIAL/PLATELET
Basophils Absolute: 0.1 10*3/uL (ref 0.0–0.1)
Basophils Relative: 1 %
EOS PCT: 8 %
Eosinophils Absolute: 0.6 10*3/uL (ref 0.0–0.7)
HCT: 43.6 % (ref 39.0–52.0)
Hemoglobin: 15.2 g/dL (ref 13.0–17.0)
LYMPHS ABS: 1.4 10*3/uL (ref 0.7–4.0)
LYMPHS PCT: 20 %
MCH: 32.6 pg (ref 26.0–34.0)
MCHC: 34.9 g/dL (ref 30.0–36.0)
MCV: 93.6 fL (ref 78.0–100.0)
MONO ABS: 0.7 10*3/uL (ref 0.1–1.0)
Monocytes Relative: 9 %
Neutro Abs: 4.5 10*3/uL (ref 1.7–7.7)
Neutrophils Relative %: 62 %
PLATELETS: 174 10*3/uL (ref 150–400)
RBC: 4.66 MIL/uL (ref 4.22–5.81)
RDW: 13.2 % (ref 11.5–15.5)
WBC: 7.3 10*3/uL (ref 4.0–10.5)

## 2017-04-30 MED ORDER — LABETALOL HCL 5 MG/ML IV SOLN: 10.0000 mg | INTRAVENOUS | Status: DC | PRN

## 2017-04-30 MED ORDER — RAMIPRIL 2.5 MG PO CAPS
2.5000 mg | ORAL_CAPSULE | Freq: Every day | ORAL | Status: DC
  Administered 2017-05-01 – 2017-05-02 (×2): 2.5 mg via ORAL
  Filled 2017-04-30 (×3): qty 1

## 2017-04-30 MED ORDER — ALUM & MAG HYDROXIDE-SIMETH 200-200-20 MG/5ML PO SUSP: 15.0000 mL | ORAL | Status: DC | PRN

## 2017-04-30 MED ORDER — HYDRALAZINE HCL 20 MG/ML IJ SOLN: 5.0000 mg | INTRAMUSCULAR | Status: DC | PRN

## 2017-04-30 MED ORDER — MUPIROCIN 2 % EX OINT
1.0000 "application " | TOPICAL_OINTMENT | Freq: Two times a day (BID) | CUTANEOUS | Status: DC
  Administered 2017-04-30 – 2017-05-02 (×4): 1 via TOPICAL
  Filled 2017-04-30: qty 22

## 2017-04-30 MED ORDER — METOPROLOL TARTRATE 5 MG/5ML IV SOLN: 2.0000 mg | INTRAVENOUS | Status: DC | PRN

## 2017-04-30 MED ORDER — ONDANSETRON HCL 4 MG/2ML IJ SOLN: 4.0000 mg | Freq: Four times a day (QID) | INTRAMUSCULAR | Status: DC | PRN

## 2017-04-30 MED ORDER — OXYCODONE-ACETAMINOPHEN 5-325 MG PO TABS
1.0000 | ORAL_TABLET | ORAL | Status: DC | PRN
  Administered 2017-05-02: 2 via ORAL
  Filled 2017-04-30 (×2): qty 2

## 2017-04-30 MED ORDER — GUAIFENESIN-DM 100-10 MG/5ML PO SYRP
15.0000 mL | ORAL_SOLUTION | ORAL | Status: DC | PRN
Start: 2017-04-30 — End: 2017-05-03

## 2017-04-30 MED ORDER — HEPARIN (PORCINE) IN NACL 100-0.45 UNIT/ML-% IJ SOLN
1200.0000 [IU]/h | INTRAMUSCULAR | Status: DC
  Administered 2017-04-30: 1100 [IU]/h via INTRAVENOUS
  Administered 2017-05-01: 1350 [IU]/h via INTRAVENOUS
  Filled 2017-04-30 (×2): qty 250

## 2017-04-30 MED ORDER — PHENOL 1.4 % MT LIQD: 1.0000 | OROMUCOSAL | Status: DC | PRN

## 2017-04-30 MED ORDER — DILTIAZEM HCL ER COATED BEADS 120 MG PO CP24
120.0000 mg | ORAL_CAPSULE | Freq: Every day | ORAL | Status: DC
  Administered 2017-05-01 – 2017-05-02 (×2): 120 mg via ORAL
  Filled 2017-04-30 (×2): qty 1

## 2017-04-30 MED ORDER — METOPROLOL SUCCINATE ER 100 MG PO TB24
100.0000 mg | ORAL_TABLET | Freq: Every day | ORAL | Status: DC
  Administered 2017-05-01 – 2017-05-02 (×2): 100 mg via ORAL
  Filled 2017-04-30 (×3): qty 1

## 2017-04-30 MED ORDER — CEFAZOLIN SODIUM-DEXTROSE 1-4 GM/50ML-% IV SOLN
1.0000 g | Freq: Three times a day (TID) | INTRAVENOUS | Status: DC
  Administered 2017-04-30: 1 g via INTRAVENOUS
  Filled 2017-04-30 (×3): qty 50

## 2017-04-30 MED ORDER — POTASSIUM CHLORIDE CRYS ER 20 MEQ PO TBCR: 20.0000 meq | EXTENDED_RELEASE_TABLET | Freq: Once | ORAL | Status: DC

## 2017-04-30 MED ORDER — PANTOPRAZOLE SODIUM 40 MG PO TBEC
40.0000 mg | DELAYED_RELEASE_TABLET | Freq: Every day | ORAL | Status: DC
  Administered 2017-05-01 – 2017-05-02 (×2): 40 mg via ORAL
  Filled 2017-04-30 (×2): qty 1

## 2017-04-30 MED ORDER — DOFETILIDE 500 MCG PO CAPS
500.0000 ug | ORAL_CAPSULE | ORAL | Status: DC
  Administered 2017-05-01 – 2017-05-02 (×2): 500 ug via ORAL
  Filled 2017-04-30 (×3): qty 1

## 2017-04-30 NOTE — ED Notes (Signed)
Patient c/o discoloration and pain in both feet worse on left. States he is scheduled for angioplasty on Monday.

## 2017-04-30 NOTE — ED Triage Notes (Signed)
To ED for eval of BLE discoloration, pain, and probable occlusion. Pt is to have procedure done by vascular on Monday but pain was worse so he went office and sent here.

## 2017-04-30 NOTE — ED Notes (Signed)
To F06 via wheelchair.  Pt ambulated to BR with no difficulties.

## 2017-04-30 NOTE — Progress Notes (Signed)
ANTICOAGULATION CONSULT NOTE - Initial Consult  Pharmacy Consult for Heparin Indication: St Jude AVR and AFib  Allergies  Allergen Reactions  . Ivp Dye [Iodinated Diagnostic Agents] Hives    Patient Measurements: Height: 5\' 9"  (175.3 cm) Weight: 182 lb 11.2 oz (82.9 kg) IBW/kg (Calculated) : 70.7 Heparin Dosing Weight: 83 kg  Vital Signs: Temp: 98.5 F (36.9 C) (09/18 2022) Temp Source: Oral (09/18 2022) BP: 164/82 (09/18 2022) Pulse Rate: 70 (09/18 2022)  Labs:  Recent Labs  04/30/17 1154  HGB 15.2  HCT 43.6  PLT 174  LABPROT 24.3*  INR 2.21  CREATININE 0.87    Estimated Creatinine Clearance: 84.7 mL/min (by C-G formula based on SCr of 0.87 mg/dL).   Medical History: Past Medical History:  Diagnosis Date  . Aortic valve defect   . Atrial fibrillation ([place])   . CHF (congestive heart failure) ([place])   . Gout   . Hypertension   . Peripheral vascular disease ([place])     Assessment: 66 YOM on warfarin PTA for hx St. Jude AVR and Afib. The patient has had increasing LLE redness and discoloration significant for occlusive disease requiring VVS intervention with planned arteriogram on either Fri, 9/21 or Mon, 9/24. Pharmacy has been consulted to start Heparin for bridging while warfarin is held for the procedure.   INR on admission is SUBtherapeutic (INR 2.21, goal of 2.5-3.5) - will initiate the heparin bridge tonight.   Goal of Therapy:  Heparin level 0.3-0.7 units/ml Monitor platelets by anticoagulation protocol: Yes   Plan:  1. Warfarin held for procedure 2. No Heparin bolus 3. Start heparin at 1100 units/hr (11 ml/hr) 4. Daily HL, CBC 5. Will continue to monitor for any signs/symptoms of bleeding and will follow up with heparin level with AM labs   Thank you for allowing pharmacy to be a part of this patient's care.  Alycia Rossetti, PharmD, BCPS Clinical Pharmacist Pager: [phone] 04/30/2017 8:58 PM

## 2017-04-30 NOTE — ED Notes (Signed)
Attempted report 

## 2017-04-30 NOTE — Telephone Encounter (Signed)
Mr. Horen called our office complaining of discoloration of his right Great toe and pain in his left foot.  Upon getting further information, he advised me that he was already in our parking lot.  I told him to come in and we would work him in to see one of our doctors.  While at the check in desk, Zigmund Daniel, [place],  said that he needs to go straight to the ED if he is having those symptoms to make sure he is not throwing a blood clot.  I advised patient and he verbalized understanding.  He says he will go there immediately.   Thurston Hole., LPN

## 2017-04-30 NOTE — ED Notes (Signed)
Report attempted 

## 2017-04-30 NOTE — ED Provider Notes (Signed)
[facility] DEPT Provider Note   CSN: [id number] Arrival date & time: 04/30/17  1102     History   Chief Complaint Chief Complaint  Patient presents with  . Leg Pain  . possible blood clot    HPI Jamorris Ndiaye is a 66 y.o. male.  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 Office visit today prompted by color change to the left lower extremity, notably red and purple. Patient reports that the pain in his left lower extremity has been getting worse over the last 3 weeks. Patient reports that the pain is currently a 5 out of 10 in his left lower extremity. Patient reports that edema of the left lower extremity has increased over the past month. Patient reports increasing pallor of the LLE over the past 3 weeks. Patient has a nonhealing ulcer of the left tibia that is unchanged. Patient reports numbness, paresthesias, and itching of the LLE. Patient denies recent immobilization or travel, recent surgeries within the last month, and plaster of the left lower extremity, hormone supplementation, or history of DT/PE. Venous Doppler performed in August 2018 showed patent veins of the bilateral lower extremities. Patient is scheduled for arterioplasty with Dr. Joylene Igo on Monday 05-06-2017. Patient due to be admitted on Friday 05-03-2017 for heparin drip to bridge warfarin.  Past Medical History:  Diagnosis Date  . Aortic valve defect   . Atrial fibrillation ([place])   . CHF (congestive heart failure) ([place])   . Gout   . Hypertension   . Peripheral vascular disease ([place])     There are no active problems to display for this patient.   Past Surgical History:  Procedure Laterality Date  . AORTIC VALVE REPLACEMENT    . PACEMAKER INSERTION          Home Medications    Prior to Admission medications   Medication Sig Start Date End Date Taking? Authorizing Provider  diltiazem (CARDIZEM CD) 120 MG 24 hr capsule Take 120 mg by mouth daily. 06/11/16   [provider]  dofetilide (TIKOSYN) 500 MCG capsule Take 500 mcg by mouth every morning.    [provider]  metoprolol succinate (TOPROL-XL) 100 MG 24 hr tablet Take 100 mg by mouth daily. Take with or immediately following a meal.    [provider]  mupirocin ointment (BACTROBAN) 2 % Apply 1 application topically 2 (two) times daily. 03/11/17   Tasia Catchings, Amy V, PA-C  ramipril (ALTACE) 2.5 MG capsule Take 2.5 mg by mouth daily.    [provider]  vitamin B-12 (CYANOCOBALAMIN) 1000 MCG tablet Take 1,000 mcg by mouth daily.    [provider]  warfarin (COUMADIN) 7.5 MG tablet Take 7.5 mg by mouth daily. Takes 7.5mg  on Sun, Mon, Wed, Thurs, Fri, Sat    [provider]  warfarin (COUMADIN) 7.5 MG tablet Take 1 tablet (7.5 mg total) by mouth daily. Patient taking differently: Take 7.5 mg by mouth daily. Takes 3.75 mg on Tuesdays 03/13/17   Lorayne Bender, PA-C    Family History Family History  Problem Relation Age of Onset  . Heart disease Mother 9       bypass surgery    Social History Social History  Substance Use Topics  . Smoking status: Never Smoker  . Smokeless tobacco:  Never Used  . Alcohol use Yes     Allergies   Ivp dye [iodinated diagnostic agents]   Review of Systems Review of Systems  Constitutional: Negative for chills and fever.  HENT: Negative for congestion, rhinorrhea and sore throat.   Eyes: Negative for visual disturbance.  Respiratory: Negative for cough, chest tightness and shortness of breath.   Cardiovascular: Positive for leg swelling. Negative for chest pain and palpitations.  Gastrointestinal: Negative for abdominal pain, diarrhea, nausea and vomiting.  Genitourinary: Negative for flank pain.   Musculoskeletal: Negative for back pain and myalgias.  Skin: Positive for color change, pallor and wound. Negative for rash.  Neurological: Positive for numbness. Negative for dizziness, syncope, light-headedness and headaches.     Physical Exam Updated Vital Signs BP (!) 153/77 (BP Location: Right Arm)   Pulse 66   Temp 98.1 F (36.7 C) (Oral)   Resp 17   SpO2 100%   Physical Exam  Constitutional: He appears well-developed and well-nourished. No distress.  HENT:  Head: Normocephalic and atraumatic.  Mouth/Throat: Oropharynx is clear and moist.  Eyes: Conjunctivae and EOM are normal.  Neck: Normal range of motion. Neck supple.  Cardiovascular: Normal rate, regular rhythm, S1 normal and S2 normal.   No murmur heard. DP and PT pulses of the left lower extremity nonpalpable. DP and PT pulses of right lower extremity 1+. More pallor of the LLE compared to right. Pallor to right hallux.  2+ pitting edema of the left lower extremity. Trace edema of right lower cavity.  Pulmonary/Chest: Effort normal and breath sounds normal. He has no wheezes. He has no rales.  Abdominal: Soft. He exhibits no distension. There is no tenderness. There is no guarding.  Musculoskeletal: Normal range of motion. He exhibits no edema or deformity.  Neurological: He is alert.  Cranial nerves grossly intact. Sensation intact to light touch of left lower extremity, but of different quality compared to proximal sensation.  Skin: Skin is warm and dry. No rash noted. No erythema.  Approximately 2 cm ulceration of the left lower tibia. Minimal surrounding erythema, fluctuance, drainage.  Psychiatric: He has a normal mood and affect. His behavior is normal. Judgment and thought content normal.  Nursing note and vitals reviewed.    ED Treatments / Results  Labs (all labs ordered are listed, but only abnormal results are displayed) Labs Reviewed  CBC WITH DIFFERENTIAL/PLATELET  BASIC METABOLIC PANEL   PROTIME-INR    EKG  EKG Interpretation None       Radiology No results found.  Procedures Procedures (including critical care time)  Medications Ordered in ED Medications - No data to display   Initial Impression / Assessment and Plan / ED Course  I have reviewed the triage vital signs and the nursing notes.  Pertinent labs & imaging results that were available during my care of the patient were reviewed by me and considered in my medical decision making (see chart for details).  Clinical Course as of May 01 1407  Tue Apr 30, 2017  1150 Patient seen and evaluated. Discussed case with Dr. Deno Etienne. Patient denied need for pain medication at this time.   [AM]  1256 Doppler performed at beside. Good signals achieved in DP and PT pulses b/l. Will place call to Dr. Doren Custard.  [AM]  [phone] Patient reevaluated. Patient resting comfortably in chair. Patient informed vascular surgery will be by to see him.  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 On presentation emergency department, patient had good signals on doppler in DP and PT pulses bilaterally, although muted on left, as well as warm extremities bilaterally and evidence of perfusion. Well's score for DVT 2 (unilateral leg swelling, chronic, and pitting edema). As these are chronic issues, do not suspect DVT at this time. Vascular surgery will consult today to determine course the patient's treatment. Patient is in understanding and agrees with plan of care.  This is a shared visit with Dr. Deno Etienne. Patient was independently evaluated by this attending  physician. Attending physician consulted in evaluation and consult management.  New Prescriptions New Prescriptions   No medications on file     Tamala Julian 04/30/17 [place], Dan, DO 05/03/17 [place], [place], DO 05/03/17 1846

## 2017-04-30 NOTE — H&P (Signed)
Patient name: Edward Marquez MRN: [id number] DOB: [birth year] Sex: male   REASON FOR ADMISSION:    Cellulitis left leg and rest pain bilaterally.  HPI:   Edward Marquez is a pleasant 66 y.o. male,  Who I saw in the office on 04/10/2017. He cut his left leg approximately 3-4 months ago while working in the yard. This wound was very slow to heal and ultimately he was evaluated by the wound care clinic and was undergoing aggressive wound care. He was making slow progress and was sent for vascular consultation. When I saw him in the office he had been off antibiotics. He had completed a course of po antibiotics.  Over the last several days she's noticed increasing redness in the left leg and also some discoloration of both feet which he described his somewhat red and blue. He went to the office today and was told to come to the emergency department given the progression of his symptoms.  Prior to cutting his leg he denied any real claudication, rest pain, or history of nonhealing ulcers. He was fairly active.  His risk factors for peripheral vascular disease include hypertension. He denies any history of diabetes, hypercholesterolemia, family history of premature cardiovascular disease or smoking history.  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 He has been on chronic Coumadin therapy for this and after discussion with his cardiologist, Dr. Ola Spurr, we will get a hold his Coumadin and admit him for a heparin bridge prior to proceeding with his arteriogram on Monday, 05/06/2017. He took his Coumadin last night.   Past Medical History:  Diagnosis Date  . Aortic valve defect   . Atrial fibrillation ([place])   . CHF (congestive heart failure) ([place])   . Gout   . Hypertension   . Peripheral vascular disease ([place])     Family History  Problem Relation Age of Onset  . Heart disease Mother 79       bypass surgery  He denies any family history of  premature cardiovascular disease.  SOCIAL HISTORY: He denies tobacco use.  Social History   Social History  . Marital status: Single    Spouse name: N/A  . Number of children: N/A  . Years of education: N/A   Occupational History  . Not on file.   Social History Main Topics  . Smoking status: Never Smoker  . Smokeless tobacco: Never Used  . Alcohol use Yes  . Drug use: No  . Sexual activity: Not on file   Other Topics Concern  . Not on file   Social History Narrative  . No narrative on file    Allergies  Allergen Reactions  . Ivp Dye [Iodinated Diagnostic Agents] Hives    No current facility-administered medications for this encounter.    Current Outpatient Prescriptions  Medication Sig Dispense Refill  . diltiazem (CARDIZEM CD) 120 MG 24 hr capsule Take 120 mg by mouth daily.    Marland Kitchen dofetilide (TIKOSYN) 500 MCG capsule Take 500 mcg by mouth every morning.    . metoprolol succinate (TOPROL-XL) 100 MG 24 hr tablet Take 100 mg by mouth daily. Take with or immediately following a meal.    . mupirocin ointment (BACTROBAN) 2 % Apply 1 application topically 2 (two) times daily. 22 g 0  . ramipril (ALTACE) 2.5 MG capsule Take 2.5 mg by mouth daily.    Marland Kitchen warfarin (COUMADIN) 7.5 MG tablet Take 3.25-7.5 mg by mouth See admin instructions.  Takes 3.25mg  on Tuesday ONLY Take 7.5 mg on Sun / Mon / Wed / Thurs / Fri / Sat      REVIEW OF SYSTEMS:  [X]  denotes positive finding, [ ]  denotes negative finding Cardiac  Comments:  Chest pain or chest pressure:    Shortness of breath upon exertion:    Short of breath when lying flat:    Irregular heart rhythm:        Vascular    Pain in calf, thigh, or hip brought on by ambulation: X   Pain in feet at night that wakes you up from your sleep:  X   Blood clot in your veins:    Leg swelling:         Pulmonary    Oxygen at home:    Productive cough:     Wheezing:         Neurologic    Sudden weakness in arms or legs:     Sudden  numbness in arms or legs:     Sudden onset of difficulty speaking or slurred speech:    Temporary loss of vision in one eye:     Problems with dizziness:         Gastrointestinal    Blood in stool:     Vomited blood:         Genitourinary    Burning when urinating:     Blood in urine:        Psychiatric    Major depression:         Hematologic    Bleeding problems:    Problems with blood clotting too easily:        Skin    Rashes or ulcers:        Constitutional    Fever or chills:     PHYSICAL EXAM:   Vitals:   04/30/17 1107 04/30/17 1200 04/30/17 1230 04/30/17 1300  BP: (!) 153/77 (!) 158/86 (!) 154/84 134/82  Pulse: 66 63 60 (!) 40  Resp: 17     Temp: 98.1 F (36.7 C)     TempSrc: Oral     SpO2: 100% 99% 99% 90%    GENERAL: The patient is a well-nourished male, in no acute distress. The vital signs are documented above. CARDIAC: There is a regular rate and rhythm.  VASCULAR: I do not detect carotid bruits.  On the right side, he has a palpable femoral and popliteal pulse. I cannot palpate pedal pulses. He has a monophasic dorsalis pedis and posterior tibial signal.  On the left side he has a palpable femoral and popliteal pulse. I cannot palpate pedal pulses. He has a monophasic dorsalis pedis and posterior tibial signal.  He has no significant lower extremity swelling.  PULMONARY: There is good air exchange bilaterally without wheezing or rales. ABDOMEN: Soft and non-tender with normal pitched bowel sounds. I cannot palpate an abdominal aortic aneurysm.  MUSCULOSKELETAL: There are no major deformities or cyanosis. NEUROLOGIC: No focal weakness or paresthesias are detected. SKIN: He has cellulitis of the left leg.  There is a wound on the anterior aspect of his left leg which measures 5 mm in width and 1.5 mm in length.  PSYCHIATRIC: The patient has a normal affect.  DATA:    ARTERIAL DOPPLER STUDY: I reviewed his previous Doppler study.  On the left side,  which is the symptomatic side, the posterior tibial and dorsalis pedis arteries are noncompressible. ABI could not be obtained. Toe pressure on the  left is 0.  On the right side, there is a monophasic dorsalis pedis and posterior tibial signal. ABI is 100% however again the arteries are calcified and this is falsely elevated. Toe pressure on the right is 12 mmHg.  VENOUS DUPLEX: I reviewed his venous duplex scan that was done on 8-18. This showed no evidence of DVT bilaterally.  LABS:  White blood cell count is 7.3. Platelets 174. GFR greater than 60. Potassium 4.4. Creatinine 0.87. INR is 2.2.  MEDICAL ISSUES:   CELLULITIS LEFT LEG WITH NONHEALING WOUND:  He will be admitted for intravenous Ancef. We will continue local wound care to his left leg wound.   TIBIAL ARTERY OCCLUSIVE DISEASE: He has evidence of tibial artery occlusive disease on exam and we will hold his Coumadin in anticipation of an arteriogram either Friday of this week or Monday. He will require a heparin bridge. Given his PVD with a nonhealing wound this is a limb threatening situation.  ANTICOAGULATION: We will hold his Coumadin and have pharmacy manage his heparin.  CONTRAST ALLERGY: He will need to be premedicated for his arteriogram which we will try to get done on Friday.  [name] [facility] [phone]

## 2017-05-01 ENCOUNTER — Encounter (HOSPITAL_COMMUNITY): Payer: Self-pay

## 2017-05-01 ENCOUNTER — Ambulatory Visit: Payer: Medicare Other | Admitting: Internal Medicine

## 2017-05-01 LAB — PROTIME-INR
INR: 1.7
Prothrombin Time: 19.8 seconds — ABNORMAL HIGH (ref 11.4–15.2)

## 2017-05-01 LAB — CBC
HEMATOCRIT: 44.1 % (ref 39.0–52.0)
HEMOGLOBIN: 15.2 g/dL (ref 13.0–17.0)
MCH: 32.3 pg (ref 26.0–34.0)
MCHC: 34.5 g/dL (ref 30.0–36.0)
MCV: 93.6 fL (ref 78.0–100.0)
Platelets: 187 10*3/uL (ref 150–400)
RBC: 4.71 MIL/uL (ref 4.22–5.81)
RDW: 13.3 % (ref 11.5–15.5)
WBC: 7.4 10*3/uL (ref 4.0–10.5)

## 2017-05-01 LAB — URINALYSIS, ROUTINE W REFLEX MICROSCOPIC
Bacteria, UA: NONE SEEN
Bilirubin Urine: NEGATIVE
GLUCOSE, UA: NEGATIVE mg/dL
HGB URINE DIPSTICK: NEGATIVE
Ketones, ur: NEGATIVE mg/dL
Leukocytes, UA: NEGATIVE
Nitrite: NEGATIVE
Protein, ur: NEGATIVE mg/dL
RBC / HPF: NONE SEEN RBC/hpf (ref 0–5)
SPECIFIC GRAVITY, URINE: 1.006 (ref 1.005–1.030)
pH: 6 (ref 5.0–8.0)

## 2017-05-01 LAB — HEPARIN LEVEL (UNFRACTIONATED)
HEPARIN UNFRACTIONATED: 0.17 [IU]/mL — AB (ref 0.30–0.70)
Heparin Unfractionated: 0.35 IU/mL (ref 0.30–0.70)

## 2017-05-01 LAB — MRSA PCR SCREENING: MRSA by PCR: NEGATIVE

## 2017-05-01 LAB — HIV ANTIBODY (ROUTINE TESTING W REFLEX): HIV SCREEN 4TH GENERATION: NONREACTIVE

## 2017-05-01 MED ORDER — CEFAZOLIN SODIUM-DEXTROSE 1-4 GM/50ML-% IV SOLN
1.0000 g | Freq: Three times a day (TID) | INTRAVENOUS | Status: DC
  Administered 2017-05-01 – 2017-05-03 (×6): 1 g via INTRAVENOUS
  Filled 2017-05-01 (×7): qty 50

## 2017-05-01 MED ORDER — INFLUENZA VAC SPLIT HIGH-DOSE 0.5 ML IM SUSY
0.5000 mL | PREFILLED_SYRINGE | INTRAMUSCULAR | Status: AC
  Administered 2017-05-02: 0.5 mL via INTRAMUSCULAR
  Filled 2017-05-01: qty 0.5

## 2017-05-01 NOTE — Plan of Care (Signed)
Problem: Physical Regulation: Goal: Ability to maintain clinical measurements within normal limits will improve Outcome: Progressing Patient reports ability to walk around room without difficulty. Pt sits up in chair most of day and night. Reports that this helps blood flow to lower left leg.

## 2017-05-01 NOTE — Progress Notes (Signed)
ANTICOAGULATION CONSULT NOTE - Follow Up Consult  Pharmacy Consult for Heparin Indication: Mechanical valve  Allergies  Allergen Reactions  . Ivp Dye [Iodinated Diagnostic Agents] Hives    Patient Measurements: Height: 5\' 9"  (175.3 cm) Weight: 182 lb 11.2 oz (82.9 kg) IBW/kg (Calculated) : 70.7 Heparin Dosing Weight:  82.9 kg  Vital Signs: Temp: 98.1 F (36.7 C) (09/19 0841) Temp Source: Oral (09/19 0841) BP: 119/77 (09/19 0841) Pulse Rate: 92 (09/19 0841)  Labs:  Recent Labs  04/30/17 1154 05/01/17 0509 05/01/17 1237 05/01/17 1603  HGB 15.2 15.2  --   --   HCT 43.6 44.1  --   --   PLT 174 187  --   --   LABPROT 24.3*  --  19.8*  --   INR 2.21  --  1.70  --   HEPARINUNFRC  --  0.17*  --  0.35  CREATININE 0.87  --   --   --     Estimated Creatinine Clearance: 84.7 mL/min (by C-G formula based on SCr of 0.87 mg/dL).   Assessment:  Anticoag: Mech AVR + Afib. Now on hep bridge while warfarin on hold for VVS procedure for B tibial artery occlusive disease. CBC stable - PTA Coumadin 7.5mg  daily exc for 3.25 on Tues. Admit INR 2.21. (Goal 2.5-3.5) - PM HL 0.35 now in goal range.   Goal of Therapy:  Heparin level 0.3-0.7 units/ml Monitor platelets by anticoagulation protocol: Yes   Plan:  Continue heparin gtt at 1,350 units/hr Monitor daily heparin level, CBC, s/s of bleed Arteriogram Friday   Edward Marquez, PharmD, BCPS Clinical Staff Pharmacist Pager [phone]  Edward Marquez 05/01/2017,5:24 PM

## 2017-05-01 NOTE — Progress Notes (Signed)
ANTICOAGULATION CONSULT NOTE - F/u Consult  Pharmacy Consult for Heparin Indication: St Jude AVR and AFib  Allergies  Allergen Reactions  . Ivp Dye [Iodinated Diagnostic Agents] Hives    Patient Measurements: Height: 5\' 9"  (175.3 cm) Weight: 182 lb 11.2 oz (82.9 kg) IBW/kg (Calculated) : 70.7 Heparin Dosing Weight: 83 kg  Vital Signs: Temp: 97.7 F (36.5 C) (09/19 0526) Temp Source: Oral (09/19 0526) BP: 148/78 (09/19 0526) Pulse Rate: 63 (09/19 0526)  Labs:  Recent Labs  04/30/17 1154 05/01/17 0509  HGB 15.2 15.2  HCT 43.6 44.1  PLT 174 187  LABPROT 24.3*  --   INR 2.21  --   HEPARINUNFRC  --  0.17*  CREATININE 0.87  --     Estimated Creatinine Clearance: 84.7 mL/min (by C-G formula based on SCr of 0.87 mg/dL).   Medical History: Past Medical History:  Diagnosis Date  . Aortic valve defect   . Atrial fibrillation ([place])   . CHF (congestive heart failure) ([place])   . Gout   . Hypertension   . Peripheral vascular disease ([place])     Assessment: 66 YOM on warfarin PTA for hx St. Jude AVR and Afib. The patient has had increasing LLE redness and discoloration significant for occlusive disease requiring VVS intervention with planned arteriogram on either Fri, 9/21 or Mon, 9/24. Pharmacy has been consulted to start Heparin for bridging while warfarin is held for the procedure. INR 2.21 on 9/18 which was subtherapeutic for his goal of 2.5-3.5.   Heparin level subtherapeutic at 0.17 - no issues with infusion per RN. CBC is stable and no s/s bleeding noted.   Goal of Therapy:  Heparin level 0.3-0.7 units/ml Monitor platelets by anticoagulation protocol: Yes   Plan:  Warfarin held for procedure Increase heparin gtt to 1350 units/hr Heparin level in 6 hrs Daily heparin level, INR, and CBC Monitor for s/s bleeding   Argie Ramming, PharmD Clinical Pharmacist 05/01/17 7:00 AM

## 2017-05-01 NOTE — Progress Notes (Signed)
   VASCULAR SURGERY ASSESSMENT & PLAN:   Cellulitis slightly improved left leg.  Now on intravenous heparin as Coumadin is on hold. Today's INR is pending. Pharmacy is managing his heparin.  Plan is for arteriogram on Friday. Based on his exam, he has tibial artery occlusive disease bilaterally.  He will need to be premedicated for his arteriogram as he does have a dye allergy.   SUBJECTIVE:   No complaints.  PHYSICAL EXAM:   Vitals:   04/30/17 1600 04/30/17 1800 04/30/17 2022 05/01/17 0526  BP: 124/87 (!) 143/90 (!) 164/82 (!) 148/78  Pulse: 67 61 70 63  Resp: 18 18 19 20   Temp:   98.5 F (36.9 C) 97.7 F (36.5 C)  TempSrc:   Oral Oral  SpO2: 95% 96% 100% 99%  Weight:   182 lb 11.2 oz (82.9 kg)   Height:   5\' 9"  (1.753 m)    Cellulitis left leg is improved.  LABS:   Lab Results  Component Value Date   WBC 7.4 05/01/2017   HGB 15.2 05/01/2017   HCT 44.1 05/01/2017   MCV 93.6 05/01/2017   PLT 187 05/01/2017   Lab Results  Component Value Date   CREATININE 0.87 04/30/2017   Lab Results  Component Value Date   INR 2.21 04/30/2017    PROBLEM LIST:    Active Problems:   PVD (peripheral vascular disease) ([facility])   CURRENT MEDS:   . diltiazem  120 mg Oral Daily  . dofetilide  500 mcg Oral BH-q7a  . [START ON 05/02/2017] Influenza vac split quadrivalent PF  0.5 mL Intramuscular Tomorrow-1000  . metoprolol succinate  100 mg Oral Daily  . mupirocin ointment  1 application Topical BID  . pantoprazole  40 mg Oral Daily  . potassium chloride  20-40 mEq Oral Once  . ramipril  2.5 mg Oral Daily    Gae Gallop Beeper: [phone] Office: [phone] 05/01/2017'

## 2017-05-01 NOTE — Plan of Care (Signed)
Problem: Education: Goal: Knowledge of [facility] Education information/materials will improve Outcome: Progressing Progressing

## 2017-05-02 ENCOUNTER — Other Ambulatory Visit: Payer: Self-pay

## 2017-05-02 LAB — PROTIME-INR
INR: 1.65
Prothrombin Time: 19.4 seconds — ABNORMAL HIGH (ref 11.4–15.2)

## 2017-05-02 LAB — CBC
HEMATOCRIT: 43.6 % (ref 39.0–52.0)
Hemoglobin: 14.7 g/dL (ref 13.0–17.0)
MCH: 32.3 pg (ref 26.0–34.0)
MCHC: 33.7 g/dL (ref 30.0–36.0)
MCV: 95.8 fL (ref 78.0–100.0)
Platelets: 186 10*3/uL (ref 150–400)
RBC: 4.55 MIL/uL (ref 4.22–5.81)
RDW: 13.4 % (ref 11.5–15.5)
WBC: 8.4 10*3/uL (ref 4.0–10.5)

## 2017-05-02 LAB — HEPARIN LEVEL (UNFRACTIONATED)
HEPARIN UNFRACTIONATED: 0.82 [IU]/mL — AB (ref 0.30–0.70)
Heparin Unfractionated: 0.71 IU/mL — ABNORMAL HIGH (ref 0.30–0.70)
Heparin Unfractionated: 0.63 IU/mL (ref 0.30–0.70)

## 2017-05-02 MED ORDER — SODIUM CHLORIDE 0.9 % IV SOLN
INTRAVENOUS | Status: DC
  Administered 2017-05-03: 06:00:00 via INTRAVENOUS

## 2017-05-02 MED ORDER — FAMOTIDINE IN NACL 20-0.9 MG/50ML-% IV SOLN
20.0000 mg | INTRAVENOUS | Status: AC
Start: 2017-05-03 — End: 2017-05-03
  Administered 2017-05-03: 20 mg via INTRAVENOUS
  Filled 2017-05-02: qty 50

## 2017-05-02 MED ORDER — HEPARIN (PORCINE) IN NACL 100-0.45 UNIT/ML-% IJ SOLN: 1100.0000 [IU]/h | INTRAMUSCULAR | Status: DC

## 2017-05-02 MED ORDER — DIPHENHYDRAMINE HCL 50 MG/ML IJ SOLN
25.0000 mg | INTRAMUSCULAR | Status: AC
  Administered 2017-05-03: 25 mg via INTRAVENOUS
  Filled 2017-05-02: qty 1

## 2017-05-02 MED ORDER — HEPARIN (PORCINE) IN NACL 100-0.45 UNIT/ML-% IJ SOLN
1100.0000 [IU]/h | INTRAMUSCULAR | Status: DC
  Administered 2017-05-02: 1100 [IU]/h via INTRAVENOUS
  Filled 2017-05-02: qty 250

## 2017-05-02 MED ORDER — METHYLPREDNISOLONE SODIUM SUCC 125 MG IJ SOLR
125.0000 mg | INTRAMUSCULAR | Status: AC
  Administered 2017-05-03: 125 mg via INTRAVENOUS
  Filled 2017-05-02: qty 2

## 2017-05-02 NOTE — Progress Notes (Signed)
ANTICOAGULATION CONSULT NOTE - Follow Up Consult  Pharmacy Consult for Heparin Indication: Mech AVR + Afib.  Allergies  Allergen Reactions  . Ivp Dye [Iodinated Diagnostic Agents] Hives    Patient Measurements: Height: 5\' 9"  (175.3 cm) Weight: 185 lb 9.6 oz (84.2 kg) IBW/kg (Calculated) : 70.7 Heparin Dosing Weight: 84.2 kg  Vital Signs: Temp: 98 F (36.7 C) (09/20 2101) Temp Source: Oral (09/20 2101) BP: 115/62 (09/20 2101) Pulse Rate: 60 (09/20 2101)  Labs:  Recent Labs  04/30/17 1154 05/01/17 0509 05/01/17 1237  05/02/17 0227 05/02/17 1151 05/02/17 2015  HGB 15.2 15.2  --   --  14.7  --   --   HCT 43.6 44.1  --   --  43.6  --   --   PLT 174 187  --   --  186  --   --   LABPROT 24.3*  --  19.8*  --  19.4*  --   --   INR 2.21  --  1.70  --  1.65  --   --   HEPARINUNFRC  --  0.17*  --   < > 0.82* 0.71* 0.63  CREATININE 0.87  --   --   --   --   --   --   < > = values in this interval not displayed.  Estimated Creatinine Clearance: 84.7 mL/min (by C-G formula based on SCr of 0.87 mg/dL).  Assessment:  Anticoag:  Mech AVR + Afib. Now on hep bridge while warfarin on hold for VVS procedure for B tibial artery occlusive disease. INR 1.65, CBC stable no bleeding noted. PM HL 0.63 in goal  Goal of Therapy:  Heparin level 0.3-0.7 units/ml Monitor platelets by anticoagulation protocol: Yes   Plan:  Continue IV heparin at 1100 units/hr Next HL and CBC in AM.  Edward Marquez, PharmD, BCPS Clinical Staff Pharmacist Pager [phone]  Edward Marquez 05/02/2017,9:02 PM

## 2017-05-02 NOTE — Progress Notes (Signed)
ANTICOAGULATION CONSULT NOTE - Follow Up Consult  Pharmacy Consult for Heparin Indication: Mechanical valve  Allergies  Allergen Reactions  . Ivp Dye [Iodinated Diagnostic Agents] Hives    Patient Measurements: Height: 5\' 9"  (175.3 cm) Weight: 182 lb 11.2 oz (82.9 kg) IBW/kg (Calculated) : 70.7 Heparin Dosing Weight:  82.9 kg  Vital Signs: Temp: 97.3 F (36.3 C) (09/19 1803) Temp Source: Oral (09/19 1803) BP: 119/59 (09/19 1803) Pulse Rate: 60 (09/19 1803)  Labs:  Recent Labs  04/30/17 1154 05/01/17 0509 05/01/17 1237 05/01/17 1603 05/02/17 0227  HGB 15.2 15.2  --   --  14.7  HCT 43.6 44.1  --   --  43.6  PLT 174 187  --   --  186  LABPROT 24.3*  --  19.8*  --  19.4*  INR 2.21  --  1.70  --  1.65  HEPARINUNFRC  --  0.17*  --  0.35 0.82*  CREATININE 0.87  --   --   --   --     Estimated Creatinine Clearance: 84.7 mL/min (by C-G formula based on SCr of 0.87 mg/dL).   Assessment: Patient with mech AVR + Afib. Now on heparin bridge while warfarin on hold for VVS procedure.  Heparin level supratherapeutic at 0.82. RN reports heparin level drawn in arm opposite the heparin gtt. CBC stable and no s/s bleeding noted.    - PTA Coumadin 7.5mg  daily exc for 3.25 on Tues. Admit INR 2.21. (Goal 2.5-3.5)  Goal of Therapy:  Heparin level 0.3-0.7 units/ml Monitor platelets by anticoagulation protocol: Yes   Plan:  Decrease heparin gtt to 1200 units/hr Heparin level in 6 hrs  Monitor daily heparin level, CBC, s/s of bleed  Argie Ramming, PharmD Clinical Pharmacist 05/02/17 4:17 AM

## 2017-05-02 NOTE — Progress Notes (Addendum)
[facility] [place]  Subjective  - no new complaints.  Objective 121/65 61 (!) 97.5 F (36.4 C) (Oral) 19 99%  Intake/Output Summary (Last 24 hours) at 05/02/17 0957 Last data filed at 05/02/17 1062  Gross per 24 hour  Intake          1442.43 ml  Output                0 ml  Net          1442.43 ml   B cellulitis Right DP/PT monophasic Doppler Left DP/PT monophasic Dopppler  Assessment/Planning: Cellulitis, tibial occlusive disease B, non healing wound left LE   INR 1.65 today Pre-op for angiogram Friday 05/03/2017 NPO past MN.  Ordered placed in chart today.   [facility] 05/02/2017 9:57 AM --  Laboratory Lab Results:  Recent Labs  05/01/17 0509 05/02/17 0227  WBC 7.4 8.4  HGB 15.2 14.7  HCT 44.1 43.6  PLT 187 186   BMET  Recent Labs  04/30/17 1154  NA 135  K 4.4  CL 103  CO2 26  GLUCOSE 101*  BUN 15  CREATININE 0.87  CALCIUM 9.2    COAG Lab Results  Component Value Date   INR 1.65 05/02/2017   INR 1.70 05/01/2017   INR 2.21 04/30/2017   No results found for: PTT  I have interviewed the patient and examined the patient. I agree with the findings by the PA. INR is 1.65. For Agram and possible intervention tomorrow. I have discussed the procedure and potential risks with the patient and he is agreeable to proceed.   Gae Gallop, MD [phone]

## 2017-05-02 NOTE — Progress Notes (Signed)
ANTICOAGULATION CONSULT NOTE - Follow Up Consult  Pharmacy Consult for Heparin Indication: Mechanical valve  Allergies  Allergen Reactions  . Ivp Dye [Iodinated Diagnostic Agents] Hives    Patient Measurements: Height: 5\' 9"  (175.3 cm) Weight: 182 lb 11.2 oz (82.9 kg) IBW/kg (Calculated) : 70.7 Heparin Dosing Weight:  82.9 kg  Vital Signs: Temp: 97.5 F (36.4 C) (09/20 0900) Temp Source: Oral (09/20 0900) BP: 121/65 (09/20 0900) Pulse Rate: 61 (09/20 0900)  Labs:  Recent Labs  04/30/17 1154  05/01/17 0509 05/01/17 1237 05/01/17 1603 05/02/17 0227 05/02/17 1151  HGB 15.2  --  15.2  --   --  14.7  --   HCT 43.6  --  44.1  --   --  43.6  --   PLT 174  --  187  --   --  186  --   LABPROT 24.3*  --   --  19.8*  --  19.4*  --   INR 2.21  --   --  1.70  --  1.65  --   HEPARINUNFRC  --   < > 0.17*  --  0.35 0.82* 0.71*  CREATININE 0.87  --   --   --   --   --   --   < > = values in this interval not displayed.  Estimated Creatinine Clearance: 84.7 mL/min (by C-G formula based on SCr of 0.87 mg/dL).   Assessment: Patient with mech AVR + Afib. Now on heparin bridge while warfarin on hold for VVS procedure planned 9/21. - PTA Coumadin 7.5mg  daily exc for 3.25 on Tues. Admit INR 2.21. (Goal 2.5-3.5)  Heparin level this afternoon remain SUPRAtherapeutic despite a rate decrease earlier today (HL 0.71 << 0.82, goal of 0.3-0.7). CBC wnl - no bleeding noted at this time.   Goal of Therapy:  Heparin level 0.3-0.7 units/ml Monitor platelets by anticoagulation protocol: Yes   Plan:  1. Decrease Heparin to 1100 units/hr (11 ml/hr) 2. Will continue to monitor for any signs/symptoms of bleeding and will follow up with heparin level in 6 hours   Thank you for allowing pharmacy to be a part of this patient's care.  Alycia Rossetti, PharmD, BCPS Clinical Pharmacist Pager: [phone] Clinical phone for 05/02/2017 from 7a-3:30p: [phone] If after 3:30p, please call main pharmacy at:  [phone] 05/02/2017 12:36 PM

## 2017-05-03 ENCOUNTER — Encounter (HOSPITAL_COMMUNITY)
Admission: EM | Disposition: A | Discharge status: Home / Self Care | Payer: Self-pay | Source: Home / Self Care | Attending: Vascular Surgery

## 2017-05-03 ENCOUNTER — Encounter (HOSPITAL_COMMUNITY): Payer: Self-pay | Admitting: Vascular Surgery

## 2017-05-03 ENCOUNTER — Ambulatory Visit (HOSPITAL_COMMUNITY): Admission: RE | Admit: 2017-05-03 | Payer: Medicare Other | Source: Ambulatory Visit | Admitting: Vascular Surgery

## 2017-05-03 ENCOUNTER — Inpatient Hospital Stay (HOSPITAL_COMMUNITY): Admission: AD | Admit: 2017-05-03 | Payer: Medicare Other | Source: Ambulatory Visit | Admitting: Vascular Surgery

## 2017-05-03 ENCOUNTER — Inpatient Hospital Stay (HOSPITAL_COMMUNITY): Payer: Medicare Other | Admitting: Anesthesiology

## 2017-05-03 DIAGNOSIS — I739 Peripheral vascular disease, unspecified: Secondary | ICD-10-CM | POA: Diagnosis present

## 2017-05-03 HISTORY — PX: VEIN HARVEST: SHX6363

## 2017-05-03 HISTORY — PX: BYPASS GRAFT POPLITEAL TO TIBIAL: SHX5764

## 2017-05-03 HISTORY — PX: ABDOMINAL AORTOGRAM W/LOWER EXTREMITY: CATH118223

## 2017-05-03 HISTORY — PX: PERIPHERAL VASCULAR BALLOON ANGIOPLASTY: CATH118281

## 2017-05-03 LAB — BASIC METABOLIC PANEL
ANION GAP: 7 (ref 5–15)
BUN: 14 mg/dL (ref 6–20)
CALCIUM: 8.9 mg/dL (ref 8.9–10.3)
CO2: 24 mmol/L (ref 22–32)
Chloride: 104 mmol/L (ref 101–111)
Creatinine, Ser: 0.88 mg/dL (ref 0.61–1.24)
Glucose, Bld: 116 mg/dL — ABNORMAL HIGH (ref 65–99)
POTASSIUM: 4.2 mmol/L (ref 3.5–5.1)
SODIUM: 135 mmol/L (ref 135–145)

## 2017-05-03 LAB — CBC
HEMATOCRIT: 42 % (ref 39.0–52.0)
HEMOGLOBIN: 14.4 g/dL (ref 13.0–17.0)
MCH: 32.6 pg (ref 26.0–34.0)
MCHC: 34.3 g/dL (ref 30.0–36.0)
MCV: 95 fL (ref 78.0–100.0)
Platelets: 183 10*3/uL (ref 150–400)
RBC: 4.42 MIL/uL (ref 4.22–5.81)
RDW: 13.3 % (ref 11.5–15.5)
WBC: 8.9 10*3/uL (ref 4.0–10.5)

## 2017-05-03 LAB — PROTIME-INR
INR: 1.26
PROTHROMBIN TIME: 15.7 s — AB (ref 11.4–15.2)

## 2017-05-03 LAB — TYPE AND SCREEN
ABO/RH(D): A NEG
Antibody Screen: NEGATIVE

## 2017-05-03 LAB — POCT ACTIVATED CLOTTING TIME
Activated Clotting Time: 219 seconds
Activated Clotting Time: 230 seconds
Activated Clotting Time: 186 seconds

## 2017-05-03 LAB — ABO/RH: ABO/RH(D): A NEG

## 2017-05-03 LAB — HEPARIN LEVEL (UNFRACTIONATED): HEPARIN UNFRACTIONATED: 0.57 [IU]/mL (ref 0.30–0.70)

## 2017-05-03 SURGERY — CREATION, BYPASS, ARTERIAL, POPLITEAL TO TIBIAL, USING GRAFT
Anesthesia: General | Site: Leg Lower | Laterality: Left

## 2017-05-03 SURGERY — ABDOMINAL AORTOGRAM W/LOWER EXTREMITY
Anesthesia: LOCAL

## 2017-05-03 MED ORDER — WARFARIN SODIUM 2.5 MG PO TABS: 2.5000 mg | ORAL_TABLET | Freq: Once | ORAL | Status: DC

## 2017-05-03 MED ORDER — SODIUM CHLORIDE 0.9 % IV SOLN: 250.0000 mL | INTRAVENOUS | Status: DC | PRN

## 2017-05-03 MED ORDER — CEFAZOLIN SODIUM-DEXTROSE 1-4 GM/50ML-% IV SOLN
INTRAVENOUS | Status: AC
  Filled 2017-05-03: qty 50

## 2017-05-03 MED ORDER — NEOSTIGMINE METHYLSULFATE 5 MG/5ML IV SOSY
PREFILLED_SYRINGE | INTRAVENOUS | Status: AC
  Filled 2017-05-03: qty 5

## 2017-05-03 MED ORDER — DOCUSATE SODIUM 100 MG PO CAPS
100.0000 mg | ORAL_CAPSULE | Freq: Every day | ORAL | Status: DC
  Administered 2017-05-05 – 2017-05-08 (×4): 100 mg via ORAL
  Filled 2017-05-03 (×6): qty 1

## 2017-05-03 MED ORDER — ROCURONIUM BROMIDE 100 MG/10ML IV SOLN
INTRAVENOUS | Status: DC | PRN
  Administered 2017-05-03: 50 mg via INTRAVENOUS
  Administered 2017-05-03: 30 mg via INTRAVENOUS
  Administered 2017-05-03: 20 mg via INTRAVENOUS
  Administered 2017-05-03: 30 mg via INTRAVENOUS
  Administered 2017-05-03: 20 mg via INTRAVENOUS

## 2017-05-03 MED ORDER — PANTOPRAZOLE SODIUM 40 MG PO TBEC
40.0000 mg | DELAYED_RELEASE_TABLET | Freq: Every day | ORAL | Status: DC
  Administered 2017-05-03 – 2017-05-09 (×7): 40 mg via ORAL
  Filled 2017-05-03 (×7): qty 1

## 2017-05-03 MED ORDER — THROMBIN 20000 UNITS EX KIT
PACK | CUTANEOUS | Status: AC
  Filled 2017-05-03: qty 1

## 2017-05-03 MED ORDER — HYDROMORPHONE HCL 1 MG/ML IJ SOLN
0.2500 mg | INTRAMUSCULAR | Status: DC | PRN
  Administered 2017-05-03 (×2): 0.5 mg via INTRAVENOUS

## 2017-05-03 MED ORDER — ONDANSETRON HCL 4 MG/2ML IJ SOLN: 4.0000 mg | Freq: Four times a day (QID) | INTRAMUSCULAR | Status: DC | PRN

## 2017-05-03 MED ORDER — POLYETHYLENE GLYCOL 3350 17 G PO PACK
17.0000 g | PACK | Freq: Every day | ORAL | Status: DC | PRN
  Administered 2017-05-04 – 2017-05-06 (×2): 17 g via ORAL
  Filled 2017-05-03 (×2): qty 1

## 2017-05-03 MED ORDER — 0.9 % SODIUM CHLORIDE (POUR BTL) OPTIME
TOPICAL | Status: DC | PRN
  Administered 2017-05-03: 2000 mL

## 2017-05-03 MED ORDER — ACETAMINOPHEN 325 MG PO TABS: 650.0000 mg | ORAL_TABLET | ORAL | Status: DC | PRN

## 2017-05-03 MED ORDER — SODIUM CHLORIDE 0.9% FLUSH
3.0000 mL | Freq: Two times a day (BID) | INTRAVENOUS | Status: DC
  Administered 2017-05-03 – 2017-05-06 (×4): 3 mL via INTRAVENOUS

## 2017-05-03 MED ORDER — OXYCODONE HCL 5 MG PO TABS: 5.0000 mg | ORAL_TABLET | Freq: Once | ORAL | Status: DC | PRN

## 2017-05-03 MED ORDER — SODIUM CHLORIDE 0.9 % WEIGHT BASED INFUSION: 1.0000 mL/kg/h | INTRAVENOUS | Status: DC

## 2017-05-03 MED ORDER — HEMOSTATIC AGENTS (NO CHARGE) OPTIME
TOPICAL | Status: DC | PRN
  Administered 2017-05-03: 1 via TOPICAL

## 2017-05-03 MED ORDER — SODIUM CHLORIDE 0.9 % IV SOLN: 500.0000 mL | Freq: Once | INTRAVENOUS | Status: DC | PRN

## 2017-05-03 MED ORDER — HYDRALAZINE HCL 20 MG/ML IJ SOLN: 5.0000 mg | INTRAMUSCULAR | Status: DC | PRN

## 2017-05-03 MED ORDER — PROPOFOL 10 MG/ML IV BOLUS
INTRAVENOUS | Status: DC | PRN
  Administered 2017-05-03: 120 mg via INTRAVENOUS

## 2017-05-03 MED ORDER — FENTANYL CITRATE (PF) 100 MCG/2ML IJ SOLN
INTRAMUSCULAR | Status: DC | PRN
Start: 2017-05-03 — End: 2017-05-03
  Administered 2017-05-03 (×2): 50 ug via INTRAVENOUS
  Administered 2017-05-03: 100 ug via INTRAVENOUS
  Administered 2017-05-03: 50 ug via INTRAVENOUS

## 2017-05-03 MED ORDER — ONDANSETRON HCL 4 MG/2ML IJ SOLN
INTRAMUSCULAR | Status: AC
  Filled 2017-05-03: qty 2

## 2017-05-03 MED ORDER — SODIUM CHLORIDE 0.9 % IV SOLN
INTRAVENOUS | Status: DC
  Administered 2017-05-03: 75 mL/h via INTRAVENOUS
  Administered 2017-05-05: 22:00:00 via INTRAVENOUS

## 2017-05-03 MED ORDER — PROTAMINE SULFATE 10 MG/ML IV SOLN
INTRAVENOUS | Status: DC | PRN
  Administered 2017-05-03: 30 mg via INTRAVENOUS
  Administered 2017-05-03: 10 mg via INTRAVENOUS

## 2017-05-03 MED ORDER — MORPHINE SULFATE (PF) 4 MG/ML IV SOLN
INTRAVENOUS | Status: AC
  Filled 2017-05-03: qty 2

## 2017-05-03 MED ORDER — PHENYLEPHRINE HCL 10 MG/ML IJ SOLN
INTRAVENOUS | Status: DC | PRN
  Administered 2017-05-03 (×2): 25 ug/min via INTRAVENOUS

## 2017-05-03 MED ORDER — ACETAMINOPHEN 650 MG RE SUPP: 325.0000 mg | RECTAL | Status: DC | PRN

## 2017-05-03 MED ORDER — CEFAZOLIN SODIUM-DEXTROSE 1-4 GM/50ML-% IV SOLN
INTRAVENOUS | Status: DC | PRN
  Administered 2017-05-03: 1 g via INTRAVENOUS

## 2017-05-03 MED ORDER — MIDAZOLAM HCL 2 MG/2ML IJ SOLN
INTRAMUSCULAR | Status: DC | PRN
  Administered 2017-05-03 (×2): 1 mg via INTRAVENOUS

## 2017-05-03 MED ORDER — FENTANYL CITRATE (PF) 100 MCG/2ML IJ SOLN
INTRAMUSCULAR | Status: DC | PRN
  Administered 2017-05-03: 25 ug via INTRAVENOUS

## 2017-05-03 MED ORDER — POTASSIUM CHLORIDE CRYS ER 20 MEQ PO TBCR
20.0000 meq | EXTENDED_RELEASE_TABLET | Freq: Every day | ORAL | Status: AC | PRN
  Administered 2017-05-05: 40 meq via ORAL
  Filled 2017-05-03: qty 2

## 2017-05-03 MED ORDER — BISACODYL 10 MG RE SUPP: 10.0000 mg | Freq: Every day | RECTAL | Status: DC | PRN

## 2017-05-03 MED ORDER — LABETALOL HCL 5 MG/ML IV SOLN: 10.0000 mg | INTRAVENOUS | Status: DC | PRN

## 2017-05-03 MED ORDER — HEPARIN SODIUM (PORCINE) 1000 UNIT/ML IJ SOLN
INTRAMUSCULAR | Status: DC | PRN
  Administered 2017-05-03: 8000 [IU] via INTRAVENOUS

## 2017-05-03 MED ORDER — OXYCODONE HCL 5 MG/5ML PO SOLN: 5.0000 mg | Freq: Once | ORAL | Status: DC | PRN

## 2017-05-03 MED ORDER — IODIXANOL 320 MG/ML IV SOLN
INTRAVENOUS | Status: DC | PRN
  Administered 2017-05-03: 170 mL via INTRA_ARTERIAL

## 2017-05-03 MED ORDER — MIDAZOLAM HCL 2 MG/2ML IJ SOLN
INTRAMUSCULAR | Status: DC | PRN
  Administered 2017-05-03: 2 mg via INTRAVENOUS

## 2017-05-03 MED ORDER — PHENOL 1.4 % MT LIQD: 1.0000 | OROMUCOSAL | Status: DC | PRN

## 2017-05-03 MED ORDER — WARFARIN - PHYSICIAN DOSING INPATIENT: Freq: Every day | Status: DC

## 2017-05-03 MED ORDER — PAPAVERINE HCL 30 MG/ML IJ SOLN
INTRAMUSCULAR | Status: AC
  Filled 2017-05-03: qty 2

## 2017-05-03 MED ORDER — HEPARIN (PORCINE) IN NACL 2-0.9 UNIT/ML-% IJ SOLN
INTRAMUSCULAR | Status: AC | PRN
  Administered 2017-05-03: 1000 mL via INTRA_ARTERIAL

## 2017-05-03 MED ORDER — SUGAMMADEX SODIUM 200 MG/2ML IV SOLN
INTRAVENOUS | Status: AC
  Filled 2017-05-03: qty 2

## 2017-05-03 MED ORDER — PAPAVERINE HCL 30 MG/ML IJ SOLN
INTRAMUSCULAR | Status: DC | PRN
  Administered 2017-05-03: 60 mg

## 2017-05-03 MED ORDER — SODIUM CHLORIDE 0.9 % IV SOLN
INTRAVENOUS | Status: DC | PRN
  Administered 2017-05-03: 500 mL

## 2017-05-03 MED ORDER — PROPOFOL 10 MG/ML IV BOLUS
INTRAVENOUS | Status: AC
  Filled 2017-05-03: qty 20

## 2017-05-03 MED ORDER — METOPROLOL TARTRATE 5 MG/5ML IV SOLN
2.0000 mg | INTRAVENOUS | Status: DC | PRN
  Administered 2017-05-05: 5 mg via INTRAVENOUS
  Filled 2017-05-03: qty 5

## 2017-05-03 MED ORDER — LIDOCAINE HCL (PF) 1 % IJ SOLN
INTRAMUSCULAR | Status: DC | PRN
  Administered 2017-05-03: 20 mL

## 2017-05-03 MED ORDER — DEXAMETHASONE SODIUM PHOSPHATE 10 MG/ML IJ SOLN
INTRAMUSCULAR | Status: DC | PRN
  Administered 2017-05-03: 10 mg via INTRAVENOUS

## 2017-05-03 MED ORDER — MORPHINE SULFATE (PF) 2 MG/ML IV SOLN: 2.0000 mg | INTRAVENOUS | Status: DC | PRN

## 2017-05-03 MED ORDER — SODIUM CHLORIDE 0.9% FLUSH: 3.0000 mL | INTRAVENOUS | Status: DC | PRN

## 2017-05-03 MED ORDER — LIDOCAINE 2% (20 MG/ML) 5 ML SYRINGE
INTRAMUSCULAR | Status: DC | PRN
  Administered 2017-05-03: 100 mg via INTRAVENOUS

## 2017-05-03 MED ORDER — SUGAMMADEX SODIUM 200 MG/2ML IV SOLN
INTRAVENOUS | Status: DC | PRN
  Administered 2017-05-03: 200 mg via INTRAVENOUS

## 2017-05-03 MED ORDER — SODIUM CHLORIDE 0.9 % IV SOLN
INTRAVENOUS | Status: DC | PRN
  Administered 2017-05-03 (×2): via INTRAVENOUS

## 2017-05-03 MED ORDER — MORPHINE SULFATE (PF) 4 MG/ML IV SOLN
5.0000 mg | Freq: Once | INTRAVENOUS | Status: AC
  Administered 2017-05-03: 5 mg via INTRAVENOUS

## 2017-05-03 MED ORDER — HEPARIN (PORCINE) IN NACL 100-0.45 UNIT/ML-% IJ SOLN
1500.0000 [IU]/h | INTRAMUSCULAR | Status: DC
  Administered 2017-05-04: 1100 [IU]/h via INTRAVENOUS
  Administered 2017-05-04: 1300 [IU]/h via INTRAVENOUS
  Administered 2017-05-05: 1100 [IU]/h via INTRAVENOUS
  Administered 2017-05-06: 1300 [IU]/h via INTRAVENOUS
  Administered 2017-05-08 (×2): 1400 [IU]/h via INTRAVENOUS
  Filled 2017-05-03 (×8): qty 250

## 2017-05-03 MED ORDER — PHENYLEPHRINE 40 MCG/ML (10ML) SYRINGE FOR IV PUSH (FOR BLOOD PRESSURE SUPPORT): PREFILLED_SYRINGE | INTRAVENOUS | Status: DC | PRN

## 2017-05-03 MED ORDER — THROMBIN 20000 UNITS EX SOLR
CUTANEOUS | Status: AC
  Filled 2017-05-03: qty 20000

## 2017-05-03 MED ORDER — PHENYLEPHRINE HCL 10 MG/ML IJ SOLN
INTRAMUSCULAR | Status: AC
  Filled 2017-05-03: qty 1

## 2017-05-03 MED ORDER — ROCURONIUM BROMIDE 10 MG/ML (PF) SYRINGE
PREFILLED_SYRINGE | INTRAVENOUS | Status: AC
  Filled 2017-05-03: qty 5

## 2017-05-03 MED ORDER — PHENYLEPHRINE HCL 10 MG/ML IJ SOLN
INTRAMUSCULAR | Status: DC | PRN
  Administered 2017-05-03: 80 ug via INTRAVENOUS
  Administered 2017-05-03: 120 ug via INTRAVENOUS

## 2017-05-03 MED ORDER — MAGNESIUM SULFATE 2 GM/50ML IV SOLN
2.0000 g | Freq: Every day | INTRAVENOUS | Status: DC | PRN
  Filled 2017-05-03: qty 50

## 2017-05-03 MED ORDER — FENTANYL CITRATE (PF) 250 MCG/5ML IJ SOLN
INTRAMUSCULAR | Status: AC
  Filled 2017-05-03: qty 5

## 2017-05-03 MED ORDER — FENTANYL CITRATE (PF) 100 MCG/2ML IJ SOLN
INTRAMUSCULAR | Status: DC | PRN
  Administered 2017-05-03: 50 ug via INTRAVENOUS
  Administered 2017-05-03: 25 ug via INTRAVENOUS

## 2017-05-03 MED ORDER — GUAIFENESIN-DM 100-10 MG/5ML PO SYRP: 15.0000 mL | ORAL_SOLUTION | ORAL | Status: DC | PRN

## 2017-05-03 MED ORDER — ONDANSETRON HCL 4 MG/2ML IJ SOLN
INTRAMUSCULAR | Status: DC | PRN
  Administered 2017-05-03: 4 mg via INTRAVENOUS

## 2017-05-03 MED ORDER — HEPARIN SODIUM (PORCINE) 1000 UNIT/ML IJ SOLN
INTRAMUSCULAR | Status: DC | PRN
  Administered 2017-05-03: 1000 [IU] via INTRAVENOUS
  Administered 2017-05-03: 8000 [IU] via INTRAVENOUS

## 2017-05-03 MED ORDER — MIDAZOLAM HCL 2 MG/2ML IJ SOLN
INTRAMUSCULAR | Status: AC
  Filled 2017-05-03: qty 2

## 2017-05-03 MED ORDER — DEXTROSE 5 % IV SOLN
1.5000 g | Freq: Two times a day (BID) | INTRAVENOUS | Status: AC
  Administered 2017-05-03 – 2017-05-04 (×2): 1.5 g via INTRAVENOUS
  Filled 2017-05-03 (×2): qty 1.5

## 2017-05-03 MED ORDER — PROTAMINE SULFATE 10 MG/ML IV SOLN
INTRAVENOUS | Status: AC
  Filled 2017-05-03: qty 10

## 2017-05-03 MED ORDER — HEPARIN SODIUM (PORCINE) 1000 UNIT/ML IJ SOLN
INTRAMUSCULAR | Status: AC
  Filled 2017-05-03: qty 4

## 2017-05-03 MED ORDER — OXYCODONE-ACETAMINOPHEN 5-325 MG PO TABS
1.0000 | ORAL_TABLET | ORAL | Status: DC | PRN
  Administered 2017-05-03 – 2017-05-05 (×4): 2 via ORAL
  Filled 2017-05-03 (×4): qty 2

## 2017-05-03 MED ORDER — ALUM & MAG HYDROXIDE-SIMETH 200-200-20 MG/5ML PO SUSP: 15.0000 mL | ORAL | Status: DC | PRN

## 2017-05-03 MED ORDER — DEXAMETHASONE SODIUM PHOSPHATE 10 MG/ML IJ SOLN
INTRAMUSCULAR | Status: AC
  Filled 2017-05-03: qty 1

## 2017-05-03 MED ORDER — HYDROMORPHONE HCL 1 MG/ML IJ SOLN
INTRAMUSCULAR | Status: AC
  Filled 2017-05-03: qty 1

## 2017-05-03 MED ORDER — ACETAMINOPHEN 325 MG PO TABS
325.0000 mg | ORAL_TABLET | ORAL | Status: DC | PRN
  Administered 2017-05-04 – 2017-05-08 (×8): 650 mg via ORAL
  Administered 2017-05-09: 325 mg via ORAL
  Filled 2017-05-03 (×9): qty 2

## 2017-05-03 SURGICAL SUPPLY — 66 items
ADH SKN CLS APL DERMABOND .7 (GAUZE/BANDAGES/DRESSINGS) ×4
AGENT HMST SPONGE THK3/8 (HEMOSTASIS) ×1
BANDAGE ESMARK 6X9 LF (GAUZE/BANDAGES/DRESSINGS) IMPLANT
BNDG CMPR 9X6 STRL LF SNTH (GAUZE/BANDAGES/DRESSINGS) ×1
BNDG ESMARK 6X9 LF (GAUZE/BANDAGES/DRESSINGS) ×3
CANISTER SUCT 3000ML PPV (MISCELLANEOUS) ×3 IMPLANT
CANNULA VESSEL 3MM 2 BLNT TIP (CANNULA) ×4 IMPLANT
CANNULA VESSEL W/WING W/VALVE (CANNULA) ×3 IMPLANT
CLIP VESOCCLUDE MED 24/CT (CLIP) ×3 IMPLANT
CLIP VESOCCLUDE SM WIDE 24/CT (CLIP) ×5 IMPLANT
CUFF TOURNIQUET SINGLE 24IN (TOURNIQUET CUFF) IMPLANT
CUFF TOURNIQUET SINGLE 34IN LL (TOURNIQUET CUFF) ×2 IMPLANT
CUFF TOURNIQUET SINGLE 44IN (TOURNIQUET CUFF) IMPLANT
DERMABOND ADVANCED (GAUZE/BANDAGES/DRESSINGS) ×8
DERMABOND ADVANCED .7 DNX12 (GAUZE/BANDAGES/DRESSINGS) IMPLANT
DRAIN CHANNEL 15F RND FF W/TCR (WOUND CARE) IMPLANT
ELECT REM PT RETURN 9FT ADLT (ELECTROSURGICAL) ×3
ELECTRODE REM PT RTRN 9FT ADLT (ELECTROSURGICAL) ×1 IMPLANT
EVACUATOR SILICONE 100CC (DRAIN) IMPLANT
GAUZE SPONGE 4X4 16PLY XRAY LF (GAUZE/BANDAGES/DRESSINGS) ×2 IMPLANT
GLOVE BIO SURGEON STRL SZ 6.5 (GLOVE) ×3 IMPLANT
GLOVE BIO SURGEON STRL SZ7.5 (GLOVE) ×3 IMPLANT
GLOVE BIO SURGEONS STRL SZ 6.5 (GLOVE) ×3
GLOVE BIOGEL PI IND STRL 6.5 (GLOVE) IMPLANT
GLOVE BIOGEL PI IND STRL 8 (GLOVE) ×1 IMPLANT
GLOVE BIOGEL PI INDICATOR 6.5 (GLOVE) ×10
GLOVE BIOGEL PI INDICATOR 8 (GLOVE) ×2
GLOVE ECLIPSE 6.5 STRL STRAW (GLOVE) ×2 IMPLANT
GLOVE ECLIPSE 7.5 STRL STRAW (GLOVE) ×6 IMPLANT
GOWN STRL REUS W/ TWL LRG LVL3 (GOWN DISPOSABLE) ×3 IMPLANT
GOWN STRL REUS W/ TWL XL LVL3 (GOWN DISPOSABLE) IMPLANT
GOWN STRL REUS W/TWL LRG LVL3 (GOWN DISPOSABLE) ×18
GOWN STRL REUS W/TWL XL LVL3 (GOWN DISPOSABLE) ×6
HEMOSTAT SPONGE AVITENE ULTRA (HEMOSTASIS) ×2 IMPLANT
KIT BASIN OR (CUSTOM PROCEDURE TRAY) ×3 IMPLANT
KIT ROOM TURNOVER OR (KITS) ×3 IMPLANT
MARKER GRAFT CORONARY BYPASS (MISCELLANEOUS) IMPLANT
NS IRRIG 1000ML POUR BTL (IV SOLUTION) ×6 IMPLANT
PACK PERIPHERAL VASCULAR (CUSTOM PROCEDURE TRAY) ×3 IMPLANT
PAD ARMBOARD 7.5X6 YLW CONV (MISCELLANEOUS) ×6 IMPLANT
SET COLLECT BLD 21X3/4 12 (NEEDLE) IMPLANT
SLEEVE SURGEON STRL (DRAPES) ×2 IMPLANT
SPONGE SURGIFOAM ABS GEL 100 (HEMOSTASIS) IMPLANT
STOPCOCK 4 WAY LG BORE MALE ST (IV SETS) IMPLANT
SUT ETHILON 3 0 PS 1 (SUTURE) IMPLANT
SUT PROLENE 5 0 C 1 24 (SUTURE) ×3 IMPLANT
SUT PROLENE 6 0 BV (SUTURE) ×13 IMPLANT
SUT PROLENE 6 0 CC 1 (SUTURE) ×2 IMPLANT
SUT PROLENE 7 0 BV 1 (SUTURE) IMPLANT
SUT SILK 2 0 FS (SUTURE) ×1 IMPLANT
SUT SILK 2 0 SH (SUTURE) ×3 IMPLANT
SUT SILK 3 0 (SUTURE) ×6
SUT SILK 3-0 18XBRD TIE 12 (SUTURE) IMPLANT
SUT VIC AB 2-0 CTB1 (SUTURE) ×6 IMPLANT
SUT VIC AB 3-0 SH 27 (SUTURE) ×15
SUT VIC AB 3-0 SH 27X BRD (SUTURE) ×2 IMPLANT
SUT VIC AB 4-0 PS2 18 (SUTURE) ×4 IMPLANT
SUT VIC AB 4-0 PS2 27 (SUTURE) ×4 IMPLANT
SUT VICRYL 4-0 PS2 18IN ABS (SUTURE) ×2 IMPLANT
TAPE UMBILICAL COTTON 1/8X30 (MISCELLANEOUS) ×2 IMPLANT
TRAY FOLEY W/METER SILVER 16FR (SET/KITS/TRAYS/PACK) ×3 IMPLANT
TUBE CONNECTING 20'X1/4 (TUBING) ×2
TUBE CONNECTING 20X1/4 (TUBING) ×2 IMPLANT
TUBING EXTENTION W/L.L. (IV SETS) IMPLANT
UNDERPAD 30X30 (UNDERPADS AND DIAPERS) ×3 IMPLANT
WATER STERILE IRR 1000ML POUR (IV SOLUTION) ×3 IMPLANT

## 2017-05-03 SURGICAL SUPPLY — 23 items
CATH ANGIO 5F BER2 100CM (CATHETERS) ×1 IMPLANT
CATH ANGIO 5F PIGTAIL 65CM (CATHETERS) ×1 IMPLANT
CATH CROSS OVER TEMPO 5F (CATHETERS) ×1 IMPLANT
CATH INFINITI VERT 5FR 125CM (CATHETERS) ×1 IMPLANT
CATH NAVICROSS ANGLED 135CM (MICROCATHETER) ×1 IMPLANT
CATH STRAIGHT 5FR 65CM (CATHETERS) ×1 IMPLANT
CATH TEMPO AQUA 5F 100CM (CATHETERS) ×1 IMPLANT
COVER PRB 48X5XTLSCP FOLD TPE (BAG) IMPLANT
COVER PROBE 5X48 (BAG) ×3
GUIDEWIRE ANGLED .035X150CM (WIRE) ×1 IMPLANT
GUIDEWIRE ANGLED .035X260CM (WIRE) ×1 IMPLANT
KIT ESSENTIALS PG (KITS) ×1 IMPLANT
KIT MICROINTRODUCER STIFF 5F (SHEATH) ×1 IMPLANT
KIT PV (KITS) ×3 IMPLANT
SHEATH PINNACLE 5F 10CM (SHEATH) ×1 IMPLANT
SHEATH PINNACLE MP 6F 45CM (SHEATH) ×1 IMPLANT
SHIELD RADPAD SCOOP 12X17 (MISCELLANEOUS) ×2 IMPLANT
SYRINGE MEDRAD AVANTA MACH 7 (SYRINGE) ×1 IMPLANT
TRANSDUCER W/STOPCOCK (MISCELLANEOUS) ×3 IMPLANT
TRAY PV CATH (CUSTOM PROCEDURE TRAY) ×3 IMPLANT
WIRE HITORQ VERSACORE ST 145CM (WIRE) ×1 IMPLANT
WIRE ROSEN-J .035X180CM (WIRE) ×1 IMPLANT
WIRE SPARTACORE .014X300CM (WIRE) ×1 IMPLANT

## 2017-05-03 NOTE — Consult Note (Signed)
           [facility] CM Primary Care Navigator  05/03/2017  Edward Marquez 03/25/1951 [id number]   Went to see patient at the bedsideto identify possible discharge needs but RNreports that heis off the unit in the Cath Lab at this time.  RN reports unsure if patient will be returning back to unit.   Will attemptto see patient at another time when he is available in the room or in his new location.     Addendum: (05/06/17)  Went back to see patient at the bedside to identify possible discharge needs. Patient reports having increased pain to non-healing left leg wound that had led to this admission/ surgery. Patient reports that he will be establishing care with Edward Marquez with [facility] as his primary care provider which was recently scheduled for October (unsure of date- may be in 3 weeks).     Patientshared [facility]  [place] and [place] on [address] to obtain medications without difficulty.   Hereports managinghismedications at home straight out of the containers but uses "pill box" when travelling.  Patient verbalized that he was driving prior to admission/ surgery but [name]) or [name]) will be providingtransportation to his doctors'appointments after discharge.  Patient's wife will be the primary caregiverat home as stated.   Discharge plan is home when ready according to patient.    Patient expressedunderstanding to call primary care provider's officewhen he getshome,to see if he can be rescheduled to an earlier date for a post discharge follow-up appointment within 2 weeks or sooner if needed.Patient letter (with PCP's contact number) was provided as areminder.  Explained to patient and about [facility] CM services available for health management at home but he states that he is "managing well at home" and denies any current needs or concerns. Patient had verbally agreed only for  EMMIcalls tofollow-up with hisrecoveryat home.   Referral made to [place] after discharge.  Patient voiced understanding to seekreferral to [facility] care management servicesfrom primary care provider if necessary in the near future.   [facility] care management information provided for future needs that may arise.   For questions, please contact:  Dannielle Huh, BSN, RN- [facility] Primary Care Navigator  Telephone: [phone] [place]

## 2017-05-03 NOTE — H&P (View-Only) (Signed)
[facility] [place]  Subjective  - no new complaints.  Objective 121/65 61 (!) 97.5 F (36.4 C) (Oral) 19 99%  Intake/Output Summary (Last 24 hours) at 05/02/17 0957 Last data filed at 05/02/17 1245  Gross per 24 hour  Intake          1442.43 ml  Output                0 ml  Net          1442.43 ml   B cellulitis Right DP/PT monophasic Doppler Left DP/PT monophasic Dopppler  Assessment/Planning: Cellulitis, tibial occlusive disease B, non healing wound left LE   INR 1.65 today Pre-op for angiogram Friday 05/03/2017 NPO past MN.  Ordered placed in chart today.   [facility] 05/02/2017 9:57 AM --  Laboratory Lab Results:  Recent Labs  05/01/17 0509 05/02/17 0227  WBC 7.4 8.4  HGB 15.2 14.7  HCT 44.1 43.6  PLT 187 186   BMET  Recent Labs  04/30/17 1154  NA 135  K 4.4  CL 103  CO2 26  GLUCOSE 101*  BUN 15  CREATININE 0.87  CALCIUM 9.2    COAG Lab Results  Component Value Date   INR 1.65 05/02/2017   INR 1.70 05/01/2017   INR 2.21 04/30/2017   No results found for: PTT  I have interviewed the patient and examined the patient. I agree with the findings by the PA. INR is 1.65. For Agram and possible intervention tomorrow. I have discussed the procedure and potential risks with the patient and he is agreeable to proceed.   Gae Gallop, MD [phone]

## 2017-05-03 NOTE — Transfer of Care (Signed)
Immediate Anesthesia Transfer of Care Note  Patient: Edward Marquez  Procedure(s) Performed: Procedure(s): LEFT POPLITEAL TO ANTERIOR TIBIAL ARTERY BYPASS WITH VEIN (Left) POPITEAL VEIN HARVEST (Left)  Patient Location: PACU  Anesthesia Type:General  Level of Consciousness: sedated  Airway & Oxygen Therapy: Patient Spontanous Breathing and Patient connected to nasal cannula oxygen  Post-op Assessment: Report given to RN and Post -op Vital signs reviewed and stable  Post vital signs: Reviewed and stable  Last Vitals:  Vitals:   05/03/17 1105 05/03/17 1110  BP:    Pulse: 66 63  Resp: 17 14  Temp:    SpO2: 100% 99%    Last Pain:  Vitals:   05/03/17 1113  TempSrc:   PainSc: 0-No pain      Patients Stated Pain Goal: 2 (70/92/95 7473)  Complications: No apparent anesthesia complications

## 2017-05-03 NOTE — Progress Notes (Signed)
Right femoral artery sheath removed and pressure held for 20 minutes. Groin level 0, right distal Posterior Tibial pulse present by doppler. No apparent complications. Patient given post instructions and verbalizes understanding. Down time begins at 15 for 4 hours.

## 2017-05-03 NOTE — Anesthesia Postprocedure Evaluation (Signed)
Anesthesia Post Note  Patient: Edward Marquez  Procedure(s) Performed: Procedure(s) (LRB): LEFT POPLITEAL TO ANTERIOR TIBIAL ARTERY BYPASS WITH VEIN (Left) POPITEAL VEIN HARVEST (Left)     Patient location during evaluation: PACU Anesthesia Type: General Level of consciousness: awake, awake and alert and oriented Pain management: pain level controlled Vital Signs Assessment: post-procedure vital signs reviewed and stable Respiratory status: spontaneous breathing, nonlabored ventilation and respiratory function stable Cardiovascular status: blood pressure returned to baseline Anesthetic complications: no    Last Vitals:  Vitals:   05/03/17 1900 05/03/17 2016  BP: (!) 144/82 134/72  Pulse: 89 86  Resp: 15 13  Temp: 36.4 C 36.8 C  SpO2: 99% 100%    Last Pain:  Vitals:   05/03/17 2016  TempSrc: Oral  PainSc:                  Shaasia Odle COKER

## 2017-05-03 NOTE — Anesthesia Procedure Notes (Signed)
Procedure Name: Intubation Date/Time: 05/03/2017 1:11 PM Performed by: Trixie Deis A Pre-anesthesia Checklist: Patient identified, Emergency Drugs available, Suction available and Patient being monitored Patient Re-evaluated:Patient Re-evaluated prior to induction Oxygen Delivery Method: Circle System Utilized Preoxygenation: Pre-oxygenation with 100% oxygen Induction Type: IV induction Ventilation: Mask ventilation without difficulty Laryngoscope Size: Mac and 4 Grade View: Grade I Tube type: Oral Number of attempts: 1 Airway Equipment and Method: Stylet and Oral airway Placement Confirmation: ETT inserted through vocal cords under direct vision,  positive ETCO2 and breath sounds checked- equal and bilateral Tube secured with: Tape Dental Injury: Teeth and Oropharynx as per pre-operative assessment

## 2017-05-03 NOTE — Anesthesia Preprocedure Evaluation (Addendum)
Anesthesia Evaluation  Patient identified by MRN, date of birth, ID band Patient awake    Reviewed: Allergy & Precautions, H&P , NPO status , Patient's Chart, lab work & pertinent test results  Airway Mallampati: II   Neck ROM: full    Dental   Pulmonary neg pulmonary ROS,    breath sounds clear to auscultation       Cardiovascular hypertension, + Peripheral Vascular Disease and +CHF  + dysrhythmias Atrial Fibrillation + pacemaker + Valvular Problems/Murmurs  Rhythm:regular Rate:Normal  S/p AVR  Echo 07/2015 Normal left ventricular size and systolic function with no appreciable segmental abnormality. Mild to moderate mitral regurgitation. Severely dilated left atrium. Normally functioning mechanical prosthetic valve in aortic position. No thrombus noted.   Neuro/Psych    GI/Hepatic   Endo/Other    Renal/GU      Musculoskeletal   Abdominal   Peds  Hematology   Anesthesia Other Findings   Reproductive/Obstetrics                            Anesthesia Physical Anesthesia Plan  ASA: III  Anesthesia Plan: General   Post-op Pain Management:    Induction: Intravenous  PONV Risk Score and Plan: 2 and Ondansetron, Dexamethasone, Midazolam and Treatment may vary due to age or medical condition  Airway Management Planned: Oral ETT  Additional Equipment:   Intra-op Plan:   Post-operative Plan: Extubation in OR  Informed Consent: I have reviewed the patients History and Physical, chart, labs and discussed the procedure including the risks, benefits and alternatives for the proposed anesthesia with the patient or authorized representative who has indicated his/her understanding and acceptance.     Plan Discussed with: CRNA, Anesthesiologist and Surgeon  Anesthesia Plan Comments:         Anesthesia Quick Evaluation

## 2017-05-03 NOTE — Op Note (Signed)
PATIENT: Edward Marquez      MRN: [id number] DOB: [birth year]    DATE OF PROCEDURE: 05/03/2017  INDICATIONS:    Montae Stager is a 66 y.o. male who presents with a nonhealing wound of the left leg. He had evidence of tibial artery occlusive disease on exam. He presents for an arteriogram.  PROCEDURE:    Ultrasound-guided access to the right common femoral artery Aortogram with bilateral iliac arteriogram Selective catheterization of the left superficial femoral artery with left lower extremity runoff Attempted balloon angioplasty of the left popliteal artery Retrograde right femoral arteriogram with right lower extremity runoff  SURGEON: Judeth Cornfield. Scot Dock, MD, FACS  ANESTHESIA: Local with sedation   EBL: Minimal  TECHNIQUE: The patient was taken to the peripheral vascular lab and was sedated. The period of conscious sedation was 99 minutes.  During that time period, I was present face-to-face 100% of the time.  The patient was administered 1 mg of Versed and 50 g of fentanyl. The patient's heart rate, blood pressure, and oxygen saturation were monitored by the nurse continuously during the procedure.  Both groins were prepped and draped in usual sterile fashion. Under ultrasound guidance, after the skin was anesthetized, the right common femoral artery was cannulated and a micropuncture sheath introduced over a wire. This was exchanged for a 5 [place] sheath over a [employer] wire. The pigtail catheter was positioned at the L1 vertebral body and flush aortogram obtained. The cath was in position above the aortic bifurcation and an oblique iliac projection was obtained. I then exchanged this catheter for a crossover catheter which was positioned into the left common iliac artery. An angled Glidewire was advanced into the superficial femoral artery and the crossover catheter exchanged for a straight catheter. Left lower extremity runoff was obtained.  There was occlusion of the left popliteal  artery at the level of the knee. I I elected to try to address this with balloon angioplasty. The anterior tibial artery was patent and the plan was to get into the anterior tibial artery for balloon angioplasty. I exchanged the 5 French sheath for a destination 6 French sheath which was positioned down into the superficial femoral artery over a Rosen wire. The patient was then heparinized. ACT was followed throughout the case. I tried to cross the occlusion using an angled Glidewire. I used a Ferd Hibbs cross catheter for support and to try to direct the catheter and wire. I also tried a [place] core wire area after multiple attempts I was unable to cannulate anterotibial artery and therefore was unsuccessful.  The sheath was retracted into the right external iliac artery and right lower extremity runoff films were obtained. The patient was transferred to the holding area for removal of the sheath. No immediate complications were noted.   FINDINGS:   1. 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 2. The infrarenal aorta, bilateral common iliac arteries, bilateral external iliac arteries and bilateral hypogastric arteries are patent. 3. On the left side, the common femoral, superficial femoral, and deep femoral arteries are patent. The above-knee popliteal artery is patent. There is an abrupt occlusion of the popliteal artery at the level of the knee. There is reconstitution of the anterior tibial artery. The tibioperoneal trunk, posterior tibial arteries are occluded.  4. On the right side, the common femoral, superficial femoral, and deep femoral arteries are patent. The popliteal artery artery is patent in the anterior tibial artery is patent. The tibial peroneal trunk, peroneal,  and posterior tibial arteries on the right are occluded.   [name], MD, FACS [facility]  DATE OF DICTATION:   05/03/2017

## 2017-05-03 NOTE — Interval H&P Note (Signed)
History and Physical Interval Note:  05/03/2017 7:32 AM  Edward Marquez  has presented today for surgery, with the diagnosis of pvd  The various methods of treatment have been discussed with the patient and family. After consideration of risks, benefits and other options for treatment, the patient has consented to  Procedure(s): ABDOMINAL AORTOGRAM W/LOWER EXTREMITY (N/A) as a surgical intervention .  The patient's history has been reviewed, patient examined, no change in status, stable for surgery.  I have reviewed the patient's chart and labs.  Questions were answered to the patient's satisfaction.     [name]

## 2017-05-03 NOTE — Progress Notes (Signed)
ANTICOAGULATION CONSULT NOTE - Follow Up Consult  Pharmacy Consult for Heparin Indication: mechanical AVR + afib  Allergies  Allergen Reactions  . Ivp Dye [Iodinated Diagnostic Agents] Hives    Patient Measurements: Height: 5\' 9"  (175.3 cm) Weight: 185 lb 9.6 oz (84.2 kg) IBW/kg (Calculated) : 70.7 Heparin Dosing Weight: 84.2 kg  Vital Signs: Temp: 97.6 F (36.4 C) (09/21 1900) Temp Source: Oral (09/21 1900) BP: 144/82 (09/21 1900) Pulse Rate: 89 (09/21 1900)  Labs:  Recent Labs  05/01/17 0509 05/01/17 1237  05/02/17 0227 05/02/17 1151 05/02/17 2015 05/03/17 0217  HGB 15.2  --   --  14.7  --   --  14.4  HCT 44.1  --   --  43.6  --   --  42.0  PLT 187  --   --  186  --   --  183  LABPROT  --  19.8*  --  19.4*  --   --  15.7*  INR  --  1.70  --  1.65  --   --  1.26  HEPARINUNFRC 0.17*  --   < > 0.82* 0.71* 0.63 0.57  CREATININE  --   --   --   --   --   --  0.88  < > = values in this interval not displayed.  Estimated Creatinine Clearance: 83.7 mL/min (by C-G formula based on SCr of 0.88 mg/dL).   Medications:  Scheduled:  . [START ON 05/04/2017] docusate sodium  100 mg Oral Daily  . HYDROmorphone      . pantoprazole  40 mg Oral Daily  . sodium chloride flush  3 mL Intravenous Q12H  . [START ON 05/04/2017] warfarin  2.5 mg Oral ONCE-1800  . [START ON 05/04/2017] Warfarin - Physician Dosing Inpatient   Does not apply q1800   Infusions:  . sodium chloride    . sodium chloride 75 mL/hr (05/03/17 1746)  . cefUROXime (ZINACEF)  IV    . magnesium sulfate 1 - 4 g bolus IVPB      Assessment: 66 yo M on Coumadin PTA which was held for VVS procedure.  Pt s/p procedure 9/21 with orders to resume heparin at 0100 on 9/22.  Pt was previously therapeutic on 1100 units/hr.  Goal of Therapy:  Heparin level 0.3-0.7 units/ml Monitor platelets by anticoagulation protocol: Yes   Plan:  Restart heparin at 1100 units/hr at 0100 9/22 Heparin level in 8 hours (0900  9/22) Heparin level and CBC daily while on heparin.  [employer], Pharm.D., BCPS Clinical Pharmacist 05/03/2017 8:03 PM

## 2017-05-03 NOTE — Op Note (Signed)
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 Scot Dock, MD, FACS  ASSIST: Leontine Locket, PA  ANESTHESIA: General   EBL: Minimal  INDICATIONS:    Edward Marquez is a 66 y.o. 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 He hasn't nonhealing wound on the anterior lateral aspect of his left leg. He has a history of an aortic valve replacement and is on chronic Coumadin therapy. He was admitted with cellulitis and started on intravenous antibiotics. His Coumadin was held. He underwent arteriogram today which showed severe tibial artery occlusive this could not be addressed with an endovascular approach. He presents for revascularization.  FINDINGS:   The anterior tibial artery was severely calcified. There was a brisk anterior tibial signal at the completion of the procedure.   TECHNIQUE: The patient was taken to the operating room and received a general anesthetic. The entire left lower extremity was prepped and draped in the usual sterile fashion. A looked at the great saphenous vein with the SonoSite and the vein in the thigh looked thickened. He does have a history of chronic venous insufficiency. The vein above and below the knee looked more reasonable. A longitudinal incision was made over the right saphenous vein above the knee and the vein was dissected free. Branches were divided between clips and 3-0 silk ties. Through this incision the dissection was carried down to the above-knee popliteal artery. This artery had a good pulse. A tunnel was started from this incision to the below-knee popliteal artery.  Leaving a skin bridge, a separate  longitudinal incision was made over the saphenous vein below the knee the vein was dissected free to the mid calf. Through the distal incision the below-knee pop to artery was exposed and a tunnel created from the below-knee to the above-knee incision. A umbilical tape was passed.  Next an incision was made over the anterolateral leg and the dissection plane established between the anterior tibial muscle and lateral muscle bed and the anterior tibial artery was identified at the depth of the wound. This artery was markedly calcified. A tunnel was created through the interosseous membrane and an umbilical tape passed. In order to provide adequate length vein did make a separate incision distally in the lower leg in order to get adequate length. Patient was then heparinized. The vein was removed from the bed after was ligated proximally and distally. It was irrigated up with heparinized saline.  Tourniquet was placed on the upper thigh. A longitudinal arteriotomy was made in the above-knee popliteal artery. The vein was sewn in a non-reverse fashion end to side to the above-knee popliteal artery using continuous 6-0 Prolene suture. A tourniquet was then released. Using a retrograde [place] valvulotome the valves were lysed and an excellent flow established to the vein graft. It was then marked to prevent twisting. It was then brought from the above-knee popliteal artery to the below-knee popliteal artery and then tunneled through the interosseous membrane for anastomosis to the anterior tibial artery. There was no twisting noted. The tourniquet was then reinflated after the leg was exsanguinated and a longitudinal arteriotomy made in the anterior tibial artery. The vein grafts cut to appropriate  length, spatulated and sewn into side to the anterior tibial artery using continuous 6-0 Prolene suture. Prior to completing this anastomosis the tourniquet was released. The artery was backbled and flushed appropriately and  the anastomosis completed. There was a good anterior tibial signal at the completion.  The heparin was partially reversed with protamine. Hemostasis was obtained and the wounds. The incisions along the medial aspect of the left leg were closed with a deep layer of 2-0 Vicryl, subcutaneous layer with 3-0 Vicryl and the skin closed with 4-0 vicryl. Incision over the lateral left leg was closed with a deep layer of 3-0 Vicryl and skin closed with 4-0 Vicryl. Sterile dressing was applied. The patient tolerated the procedure well and was transferred to the recovery room in stable condition. All needle and sponge counts were correct.  [name], MD, FACS [facility]  DATE OF DICTATION:   05/03/2017

## 2017-05-04 ENCOUNTER — Other Ambulatory Visit: Payer: Self-pay

## 2017-05-04 LAB — BASIC METABOLIC PANEL
Anion gap: 6 (ref 5–15)
Anion gap: 6 (ref 5–15)
BUN: 17 mg/dL (ref 6–20)
BUN: 19 mg/dL (ref 6–20)
CHLORIDE: 108 mmol/L (ref 101–111)
CHLORIDE: 106 mmol/L (ref 101–111)
CO2: 21 mmol/L — AB (ref 22–32)
CO2: 25 mmol/L (ref 22–32)
CREATININE: 0.95 mg/dL (ref 0.61–1.24)
Calcium: 8.3 mg/dL — ABNORMAL LOW (ref 8.9–10.3)
Calcium: 8.9 mg/dL (ref 8.9–10.3)
Creatinine, Ser: 0.94 mg/dL (ref 0.61–1.24)
GFR calc non Af Amer: >60 mL/min (ref >60)
GFR calc non Af Amer: >60 mL/min (ref >60)
Glucose, Bld: 143 mg/dL — ABNORMAL HIGH (ref 65–99)
Glucose, Bld: 135 mg/dL — ABNORMAL HIGH (ref 65–99)
POTASSIUM: 4.2 mmol/L (ref 3.5–5.1)
POTASSIUM: 4.8 mmol/L (ref 3.5–5.1)
SODIUM: 137 mmol/L (ref 135–145)
Sodium: 135 mmol/L (ref 135–145)

## 2017-05-04 LAB — CBC
HEMATOCRIT: 36.2 % — AB (ref 39.0–52.0)
HEMOGLOBIN: 12.2 g/dL — AB (ref 13.0–17.0)
MCH: 32.5 pg (ref 26.0–34.0)
MCHC: 33.7 g/dL (ref 30.0–36.0)
MCV: 96.5 fL (ref 78.0–100.0)
PLATELETS: 148 10*3/uL — AB (ref 150–400)
RBC: 3.75 MIL/uL — AB (ref 4.22–5.81)
RDW: 13.5 % (ref 11.5–15.5)
WBC: 16.2 10*3/uL — AB (ref 4.0–10.5)

## 2017-05-04 LAB — MAGNESIUM
MAGNESIUM: 2.1 mg/dL (ref 1.7–2.4)
Magnesium: 2.1 mg/dL (ref 1.7–2.4)

## 2017-05-04 LAB — HEPARIN LEVEL (UNFRACTIONATED)
HEPARIN UNFRACTIONATED: 0.17 [IU]/mL — AB (ref 0.30–0.70)
Heparin Unfractionated: 0.54 IU/mL (ref 0.30–0.70)

## 2017-05-04 MED ORDER — WARFARIN SODIUM 7.5 MG PO TABS
7.5000 mg | ORAL_TABLET | Freq: Once | ORAL | Status: AC
  Administered 2017-05-04: 7.5 mg via ORAL
  Filled 2017-05-04: qty 1

## 2017-05-04 MED ORDER — SODIUM CHLORIDE 0.9% FLUSH: 3.0000 mL | INTRAVENOUS | Status: DC | PRN

## 2017-05-04 MED ORDER — WARFARIN - PHARMACIST DOSING INPATIENT
Freq: Every day | Status: DC
  Administered 2017-05-04 – 2017-05-05 (×2)

## 2017-05-04 MED ORDER — HEPARIN BOLUS VIA INFUSION
750.0000 [IU] | Freq: Once | INTRAVENOUS | Status: AC
  Administered 2017-05-04: 750 [IU] via INTRAVENOUS
  Filled 2017-05-04: qty 750

## 2017-05-04 MED ORDER — SODIUM CHLORIDE 0.9% FLUSH: 3.0000 mL | Freq: Two times a day (BID) | INTRAVENOUS | Status: DC

## 2017-05-04 MED ORDER — DOFETILIDE 500 MCG PO CAPS
500.0000 ug | ORAL_CAPSULE | Freq: Two times a day (BID) | ORAL | Status: DC
Start: 2017-05-05 — End: 2017-05-09
  Administered 2017-05-05 – 2017-05-09 (×9): 500 ug via ORAL
  Filled 2017-05-04 (×9): qty 1

## 2017-05-04 MED ORDER — SODIUM CHLORIDE 0.9 % IV SOLN: 250.0000 mL | INTRAVENOUS | Status: DC | PRN

## 2017-05-04 NOTE — Progress Notes (Signed)
  Progress Note    05/04/2017 10:30 AM 1 Day Post-Op  Subjective:  No complaints  Afebrile VSS   Vitals:   05/03/17 2343 05/04/17 0425  BP: 108/60 107/62  Pulse: 87 70  Resp: 18 14  Temp: 98.5 F (36.9 C) 98.2 F (36.8 C)  SpO2: 95% 93%    Physical Exam: Cardiac:  regular Lungs:  Non labored Incisions:  Ace wrap in place and is dry Extremities:  Brisk left AT doppler signal; swelling LLE   CBC    Component Value Date/Time   WBC 16.2 (H) 05/04/2017 0939   RBC 3.75 (L) 05/04/2017 0939   HGB 12.2 (L) 05/04/2017 0939   HCT 36.2 (L) 05/04/2017 0939   PLT 148 (L) 05/04/2017 0939   MCV 96.5 05/04/2017 0939   MCH 32.5 05/04/2017 0939   MCHC 33.7 05/04/2017 0939   RDW 13.5 05/04/2017 0939   LYMPHSABS 1.4 04/30/2017 1154   MONOABS 0.7 04/30/2017 1154   EOSABS 0.6 04/30/2017 1154   BASOSABS 0.1 04/30/2017 1154    BMET    Component Value Date/Time   NA 135 05/04/2017 0425   K 4.2 05/04/2017 0425   CL 108 05/04/2017 0425   CO2 21 (L) 05/04/2017 0425   GLUCOSE 143 (H) 05/04/2017 0425   BUN 17 05/04/2017 0425   CREATININE 0.95 05/04/2017 0425   CALCIUM 8.3 (L) 05/04/2017 0425   GFRNONAA >60 05/04/2017 0425   GFRAA >60 05/04/2017 0425    INR    Component Value Date/Time   INR 1.26 05/03/2017 0217     Intake/Output Summary (Last 24 hours) at 05/04/17 1030 Last data filed at 05/04/17 1012  Gross per 24 hour  Intake           3190.4 ml  Output             2790 ml  Net            400.4 ml     Assessment:  66 y.o. male is s/p:  Left above-knee popliteal to anterior tibial artery bypass with a non-reversed translocated saphenous vein graft  1 Day Post-Op  Plan: -pt doing well this morning with brisk doppler signal to left AT. -he does have some swelling in the lower extremity and a 4" ace was applied. -continue mobilization/PT; incentive spirometer -mild leukocytosis most likely related to peri-op.   -DVT prophylaxis:  Heparin gtt.  Will place  coumadin per pharmacy consult for pt's mechanical aortic valve.    Leontine Locket, PA-C [facility] [phone] 05/04/2017 10:30 AM  Agree with the above.  Excellent doppler signals.  Continue with mobilization.  Continue anticoagulation for mechanical valve.  Annamarie Major

## 2017-05-04 NOTE — Progress Notes (Signed)
ANTICOAGULATION CONSULT NOTE - Follow Up Consult  Pharmacy Consult for Heparin Indication: mechanical AVR + afib  Allergies  Allergen Reactions  . Ivp Dye [Iodinated Diagnostic Agents] Hives   Patient Measurements: Height: 5\' 9"  (175.3 cm) Weight: 185 lb 9.6 oz (84.2 kg) IBW/kg (Calculated) : 70.7 Heparin Dosing Weight: 84.2 kg  Vital Signs:    Labs:  Recent Labs  05/02/17 0227  05/03/17 0217 05/04/17 0425 05/04/17 0939 05/04/17 1418 05/04/17 1837  HGB 14.7  --  14.4  --  12.2*  --   --   HCT 43.6  --  42.0  --  36.2*  --   --   PLT 186  --  183  --  148*  --   --   LABPROT 19.4*  --  15.7*  --   --   --   --   INR 1.65  --  1.26  --   --   --   --   HEPARINUNFRC 0.82*  < > 0.57  --  0.17*  --  0.54  CREATININE  --   --  0.88 0.95  --  0.94  --   < > = values in this interval not displayed.  Estimated Creatinine Clearance: 78.3 mL/min (by C-G formula based on SCr of 0.94 mg/dL).  Medications:  Scheduled:  . docusate sodium  100 mg Oral Daily  . [START ON 05/05/2017] dofetilide  500 mcg Oral BID  . pantoprazole  40 mg Oral Daily  . sodium chloride flush  3 mL Intravenous Q12H  . sodium chloride flush  3 mL Intravenous Q12H  . Warfarin - Pharmacist Dosing Inpatient   Does not apply q1800   Infusions:  . sodium chloride    . sodium chloride 75 mL/hr (05/03/17 1746)  . sodium chloride    . heparin 1,300 Units/hr (05/04/17 1131)  . magnesium sulfate 1 - 4 g bolus IVPB     Assessment: 66 yo M on Coumadin PTA for mechanical valve which was held for VVS procedure.  Pt s/p procedure 9/21 with heparin resumed ~0100 on 9/22.  CBC down slightly this morning following yesterdays procedure. HgB 12.2, PLT 148. Warfarin will be resumed this evening.  Heparin level therapeutic at 0.54. No bleeding or infusion issues reported from RN  Goal of Therapy:  Heparin level 0.3-0.7 units/ml Monitor platelets by anticoagulation protocol: Yes   Plan:  1. Continue heparin gtt at  1300 units/hr   2. Confirmatory heparin level with am labs  3. Heparin level and CBC daily while on heparin  Vincenza Hews, PharmD, BCPS 05/04/2017, 7:40 PM

## 2017-05-04 NOTE — Progress Notes (Signed)
Pt unable to void. Bladder scan showed 375cc.Will pass to incoming Nurse. Isac Caddy, RN

## 2017-05-04 NOTE — Evaluation (Signed)
Physical Therapy Evaluation Patient Details Name: Edward Marquez MRN: [id number] DOB: 04-05-1951 Today's Date: 05/04/2017   History of Present Illness  Pt is 66 yo male who underwent Left above-knee popliteal to anterior tibial artery bypass with a non-reversed translocated saphenous vein graft. PMH: PVD, a-fib, CHF, gout  Clinical Impression  Pt admitted with above diagnosis. Pt currently with functional limitations due to the deficits listed below (see PT Problem List). Pt moving well though gait pattern affected by tightness left knee and ankle. In addition, pt with decreased sensation L foot.  Pt will benefit from skilled PT to increase their independence and safety with mobility to allow discharge to the venue listed below.       Follow Up Recommendations No PT follow up    Equipment Recommendations  Rolling walker with 5" wheels    Recommendations for Other Services       Precautions / Restrictions Precautions Precautions: None Restrictions Weight Bearing Restrictions: No      Mobility  Bed Mobility Overal bed mobility: Independent                Transfers Overall transfer level: Modified independent Equipment used: None                Ambulation/Gait Ambulation/Gait assistance: Supervision Ambulation Distance (Feet): 100 Feet Assistive device: Rolling walker (2 wheeled) Gait Pattern/deviations: Step-through pattern;Decreased stride length;Decreased weight shift to left Gait velocity: decreased Gait velocity interpretation: Below normal speed for age/gender General Gait Details: pt reliant on RW today for pain control. Able to get heel to floor but only in foot flat position, decreased df noted  Stairs Stairs: Yes Stairs assistance: Supervision Stair Management: Two rails;Alternating pattern;Forwards Number of Stairs: 5 General stair comments: pt able to ascend with alternating patttern, descended with step to pattern. Pt winded after completing but  VSS, HR 86 bpm, O2 sats 98%  Wheelchair Mobility    Modified Rankin (Stroke Patients Only)       Balance Overall balance assessment: No apparent balance deficits (not formally assessed)                                           Pertinent Vitals/Pain Pain Assessment: Faces Faces Pain Scale: Hurts even more Pain Location: LLE Pain Descriptors / Indicators: Burning;Aching Pain Intervention(s): Limited activity within patient's tolerance;Monitored during session    Home Living Family/patient expects to be discharged to:: Private residence Living Arrangements: Spouse/significant other Available Help at Discharge: Family;Available PRN/intermittently Type of Home: House Home Access: Stairs to enter Entrance Stairs-Rails: Right Entrance Stairs-Number of Steps: 12 (but can go in front with 4) Home Layout: Two level;1/2 bath on main level;Bed/bath upstairs Home Equipment: None Additional Comments: pt's wife works from home but she travels quite a bit    Prior Function Level of Independence: Independent         Comments: pt very active: horseback riding, managing farm, kayaking, sailing     Hand Dominance        Extremity/Trunk Assessment   Upper Extremity Assessment Upper Extremity Assessment: Overall WFL for tasks assessed    Lower Extremity Assessment Lower Extremity Assessment: LLE deficits/detail LLE Deficits / Details: numbness L foot, hip flex >3/5, tightness at L ankle and knee LLE Sensation: decreased light touch;decreased proprioception    Cervical / Trunk Assessment Cervical / Trunk Assessment: Normal  Communication   Communication: No difficulties  Cognition Arousal/Alertness: Awake/alert Behavior During Therapy: WFL for tasks assessed/performed Overall Cognitive Status: Within Functional Limits for tasks assessed                                        General Comments General comments (skin integrity, edema,  etc.): pt had a valve replacement in the past and will be here until 9/26 likely for INR to become therapeutic again    Exercises General Exercises - Lower Extremity Ankle Circles/Pumps: AROM;Left;10 reps;Seated Quad Sets: AROM;Left;10 reps;Seated Heel Slides: AROM;Left;10 reps;Seated Straight Leg Raises: AROM;Left;10 reps;Seated   Assessment/Plan    PT Assessment Patient needs continued PT services  PT Problem List Decreased activity tolerance;Decreased mobility;Pain;Cardiopulmonary status limiting activity;Decreased knowledge of use of DME;Impaired sensation       PT Treatment Interventions DME instruction;Gait training;Stair training;Functional mobility training;Therapeutic activities;Therapeutic exercise;Patient/family education    PT Goals (Current goals can be found in the Care Plan section)  Acute Rehab PT Goals Patient Stated Goal: return home, be able to snow ski in March and kayak/ horseback ride in Feb PT Goal Formulation: With patient Time For Goal Achievement: 05/18/17 Potential to Achieve Goals: Good    Frequency Min 3X/week   Barriers to discharge        Co-evaluation               AM-PAC PT "6 Clicks" Daily Activity  Outcome Measure Difficulty turning over in bed (including adjusting bedclothes, sheets and blankets)?: None Difficulty moving from lying on back to sitting on the side of the bed? : None Difficulty sitting down on and standing up from a chair with arms (e.g., wheelchair, bedside commode, etc,.)?: None Help needed moving to and from a bed to chair (including a wheelchair)?: None Help needed walking in hospital room?: None Help needed climbing 3-5 steps with a railing? : A Little 6 Click Score: 23    End of Session   Activity Tolerance: Patient tolerated treatment well Patient left: in chair;with call bell/phone within reach Nurse Communication: Mobility status PT Visit Diagnosis: Difficulty in walking, not elsewhere classified  (R26.2);Pain Pain - Right/Left: Left Pain - part of body: Leg    Time: 2956-2130 PT Time Calculation (min) (ACUTE ONLY): 32 min   Charges:   PT Evaluation $PT Eval Moderate Complexity: 1 Mod PT Treatments $Gait Training: 8-22 mins   PT G Codes:       [name]  [name] 05/04/2017, 1:40 PM

## 2017-05-04 NOTE — Progress Notes (Addendum)
ANTICOAGULATION CONSULT NOTE - Follow Up Consult  Pharmacy Consult for Heparin Indication: mechanical AVR + afib  Allergies  Allergen Reactions  . Ivp Dye [Iodinated Diagnostic Agents] Hives   Patient Measurements: Height: 5\' 9"  (175.3 cm) Weight: 185 lb 9.6 oz (84.2 kg) IBW/kg (Calculated) : 70.7 Heparin Dosing Weight: 84.2 kg  Vital Signs: Temp: 98.2 F (36.8 C) (09/22 0425) Temp Source: Oral (09/22 0425) BP: 107/62 (09/22 0425) Pulse Rate: 70 (09/22 0425)  Labs:  Recent Labs  05/01/17 1237  05/02/17 0227 05/02/17 1151 05/02/17 2015 05/03/17 0217 05/04/17 0425 05/04/17 0939  HGB  --   < > 14.7  --   --  14.4  --  12.2*  HCT  --   --  43.6  --   --  42.0  --  36.2*  PLT  --   --  186  --   --  183  --  148*  LABPROT 19.8*  --  19.4*  --   --  15.7*  --   --   INR 1.70  --  1.65  --   --  1.26  --   --   HEPARINUNFRC  --   < > 0.82* 0.71* 0.63 0.57  --   --   CREATININE  --   --   --   --   --  0.88 0.95  --   < > = values in this interval not displayed.  Estimated Creatinine Clearance: 77.5 mL/min (by C-G formula based on SCr of 0.95 mg/dL).  Medications:  Scheduled:  . docusate sodium  100 mg Oral Daily  . pantoprazole  40 mg Oral Daily  . sodium chloride flush  3 mL Intravenous Q12H   Infusions:  . sodium chloride    . sodium chloride 75 mL/hr (05/03/17 1746)  . heparin 1,100 Units/hr (05/04/17 0006)  . magnesium sulfate 1 - 4 g bolus IVPB     Assessment: 66 yo M on Coumadin PTA for mechanical valve which was held for VVS procedure.  Pt s/p procedure 9/21 with heparin resumed ~0100 on 9/22.  CBC down slightly this morning following yesterdays procedure. HgB 12.2, PLT 148. Warfarin will be resumed this evening.  Heparin level: 0.17  - no bleeding or infusion issues reported from RN  Goal of Therapy:  Heparin level 0.3-0.7 units/ml Monitor platelets by anticoagulation protocol: Yes   Plan:  Warfarin 7.5mg  x1 tonight  Bolus heparin 750 units once   Increase heparin gtt to 1300 units/hr   Heparin level in 8 hours  Heparin level and CBC daily while on heparin.  Georga Bora, PharmD Clinical Pharmacist Pager: [phone] 05/04/2017 10:41 AM

## 2017-05-04 NOTE — Progress Notes (Signed)
Pharmacy Review for Dofetilide (Tikosyn) Initiation  Admit Complaint: 66 y.o. male admitted 04/30/2017 with atrial fibrillation to be initiated on dofetilide.   Assessment:  Patient Exclusion Criteria: If any screening criteria checked as "Yes", then  patient  should NOT receive dofetilide until criteria item is corrected. If "Yes" please indicate correction plan.  YES  NO Patient  Exclusion Criteria Correction Plan  [x]  []  Baseline QTc interval is greater than or equal to 440 msec. IF above YES box checked dofetilide contraindicated unless patient has ICD; then may proceed if QTc 500-550 msec or with known ventricular conduction abnormalities may proceed with QTc 550-600 msec. QTc = 513 05/04/17 02/08/18 cardiac defibrillator in place per [facility] note  [x]  []  Magnesium level is less than 1.8 mEq/l : Last magnesium: No results found for: MG     Mg lab ordered this afternoon with heparin level to avoid additional lab draw  []  [x]  Potassium level is less than 4 mEq/l : Last potassium:  Lab Results  Component Value Date   K 4.2 05/04/2017         []  [x]  Patient is known or suspected to have a digoxin level greater than 2 ng/ml: No results found for: DIGOXIN    []  [x]  Creatinine clearance less than 20 ml/min (calculated using Cockcroft-Gault, actual body weight and serum creatinine): Estimated Creatinine Clearance: 77.5 mL/min (by C-G formula based on SCr of 0.95 mg/dL).    []  [x]  Patient has received drugs known to prolong the QT intervals within the last 48 hours (phenothiazines, tricyclics or tetracyclic antidepressants, erythromycin, H-1 antihistamines, cisapride, fluoroquinolones, azithromycin). Drugs not listed above may have an, as yet, undetected potential to prolong the QT interval, updated information on QT prolonging agents is available at this website:QT prolonging agents   []  [x]  Patient received a dose of hydrochlorothiazide (Oretic) alone or in any combination including triamterene  (Dyazide, Maxzide) in the last 48 hours.   []  [x]  Patient received a medication known to increase dofetilide plasma concentrations prior to initial dofetilide dose:  . Trimethoprim (Primsol, Proloprim) in the last 36 hours . Verapamil (Calan, Verelan) in the last 36 hours or a sustained release dose in the last 72 hours . Megestrol (Megace) in the last 5 days  . Cimetidine (Tagamet) in the last 6 hours . Ketoconazole (Nizoral) in the last 24 hours . Itraconazole (Sporanox) in the last 48 hours  . Prochlorperazine (Compazine) in the last 36 hours    []  [x]  Patient is known to have a history of torsades de pointes; congenital or acquired long QT syndromes.   []  [x]  Patient has received a Class 1 antiarrhythmic with less than 2 half-lives since last dose. (Disopyramide, Quinidine, Procainamide, Lidocaine, Mexiletine, Flecainide, Propafenone)   []  [x]  Patient has received amiodarone therapy in the past 3 months or amiodarone level is greater than 0.3 ng/ml.    Patient has been appropriately anticoagulated with heparin/warfarin  Ordering provider was confirmed at [email or web address] if they are not listed on the [facility] Prescribers list.  Goal of Therapy: Follow renal function, electrolytes, potential drug interactions, and dose adjustment. Provide education and 1 week supply at discharge.  Plan:  [x]   Physician selected initial dose within range recommended for patients level of renal function - will monitor for response.  []   Physician selected initial dose outside of range recommended for patients level of renal function - will discuss if the dose should be altered at this time.   Select One Calculated CrCl  Dose q12h  [x]  > 60 ml/min 500 mcg  []  40-60 ml/min 250 mcg  []  20-40 ml/min 125 mcg   2. Follow up QTc after the first 5 doses, renal function, electrolytes (K & Mg) daily x 3     days, dose adjustment, success of initiation and facilitate 1 week discharge supply as      clinically indicated.  3. Initiate Tikosyn education video (Call [phone] and ask for video # [phone]).  4. Place Enrollment Form on the chart for discharge supply of dofetilide.    Jodean Lima Mayia Megill 2:23 PM 05/04/2017

## 2017-05-05 ENCOUNTER — Other Ambulatory Visit: Payer: Self-pay

## 2017-05-05 LAB — CBC
HEMATOCRIT: 32.5 % — AB (ref 39.0–52.0)
HEMATOCRIT: 37.7 % — AB (ref 39.0–52.0)
HEMOGLOBIN: 11.3 g/dL — AB (ref 13.0–17.0)
Hemoglobin: 12.7 g/dL — ABNORMAL LOW (ref 13.0–17.0)
MCH: 33.4 pg (ref 26.0–34.0)
MCH: 32.6 pg (ref 26.0–34.0)
MCHC: 34.8 g/dL (ref 30.0–36.0)
MCHC: 33.7 g/dL (ref 30.0–36.0)
MCV: 96.2 fL (ref 78.0–100.0)
MCV: 96.9 fL (ref 78.0–100.0)
PLATELETS: 104 10*3/uL — AB (ref 150–400)
Platelets: 145 10*3/uL — ABNORMAL LOW (ref 150–400)
RBC: 3.38 MIL/uL — AB (ref 4.22–5.81)
RBC: 3.89 MIL/uL — ABNORMAL LOW (ref 4.22–5.81)
RDW: 13.7 % (ref 11.5–15.5)
RDW: 13.4 % (ref 11.5–15.5)
WBC: 10.9 10*3/uL — AB (ref 4.0–10.5)
WBC: 11.7 10*3/uL — ABNORMAL HIGH (ref 4.0–10.5)

## 2017-05-05 LAB — PROTIME-INR
INR: 1.28
Prothrombin Time: 15.9 seconds — ABNORMAL HIGH (ref 11.4–15.2)

## 2017-05-05 LAB — HEPARIN LEVEL (UNFRACTIONATED)
HEPARIN UNFRACTIONATED: 0.55 [IU]/mL (ref 0.30–0.70)
HEPARIN UNFRACTIONATED: 0.2 [IU]/mL — AB (ref 0.30–0.70)
Heparin Unfractionated: 0.96 IU/mL — ABNORMAL HIGH (ref 0.30–0.70)

## 2017-05-05 LAB — MAGNESIUM: Magnesium: 2 mg/dL (ref 1.7–2.4)

## 2017-05-05 LAB — BASIC METABOLIC PANEL
Anion gap: 11 (ref 5–15)
BUN: 16 mg/dL (ref 6–20)
CO2: 17 mmol/L — ABNORMAL LOW (ref 22–32)
CREATININE: 0.71 mg/dL (ref 0.61–1.24)
Calcium: 8.4 mg/dL — ABNORMAL LOW (ref 8.9–10.3)
Chloride: 108 mmol/L (ref 101–111)
GLUCOSE: 103 mg/dL — AB (ref 65–99)
Potassium: 3.9 mmol/L (ref 3.5–5.1)
Sodium: 136 mmol/L (ref 135–145)

## 2017-05-05 MED ORDER — WARFARIN SODIUM 7.5 MG PO TABS
7.5000 mg | ORAL_TABLET | Freq: Once | ORAL | Status: AC
  Administered 2017-05-05: 7.5 mg via ORAL
  Filled 2017-05-05: qty 1

## 2017-05-05 NOTE — Progress Notes (Addendum)
ANTICOAGULATION CONSULT NOTE - Follow Up Consult  Pharmacy Consult for Heparin Indication: mechanical AVR + afib  Allergies  Allergen Reactions  . Ivp Dye [Iodinated Diagnostic Agents] Hives   Patient Measurements: Height: 5\' 9"  (175.3 cm) Weight: 187 lb 6.4 oz (85 kg) IBW/kg (Calculated) : 70.7 Heparin Dosing Weight: 84.2 kg  Vital Signs: Temp: 97.8 F (36.6 C) (09/23 0431) Temp Source: Oral (09/23 0431) BP: 122/78 (09/23 0431) Pulse Rate: 60 (09/23 0431)  Labs:  Recent Labs  05/03/17 0217 05/04/17 0425 05/04/17 0939 05/04/17 1418 05/04/17 1837 05/05/17 0511  HGB 14.4  --  12.2*  --   --  11.3*  HCT 42.0  --  36.2*  --   --  32.5*  PLT 183  --  148*  --   --  PENDING  LABPROT 15.7*  --   --   --   --  15.9*  INR 1.26  --   --   --   --  1.28  HEPARINUNFRC 0.57  --  0.17*  --  0.54 0.96*  CREATININE 0.88 0.95  --  0.94  --  0.71    Estimated Creatinine Clearance: 99.5 mL/min (by C-G formula based on SCr of 0.71 mg/dL).  Medications:  Scheduled:  . docusate sodium  100 mg Oral Daily  . dofetilide  500 mcg Oral BID  . pantoprazole  40 mg Oral Daily  . sodium chloride flush  3 mL Intravenous Q12H  . sodium chloride flush  3 mL Intravenous Q12H  . Warfarin - Pharmacist Dosing Inpatient   Does not apply q1800   Infusions:  . sodium chloride    . sodium chloride 75 mL/hr (05/03/17 1746)  . sodium chloride    . heparin 1,300 Units/hr (05/04/17 2300)  . magnesium sulfate 1 - 4 g bolus IVPB     Assessment: 66 yo M on Coumadin PTA for mechanical valve which was held for VVS procedure.  Pt s/p procedure 9/21 with heparin resumed ~0100 on 9/22. Warfarin resumed on 9/22.  Heparin level superatherapeutic at 0.96 and RN reports drawn in arm opposite the heparin gtt. No new CBC from today, but RN reports no bleeding concerns. Did note drop in hgb and plts from 9/21 to 9/22.   Goal of Therapy:  Heparin level 0.3-0.7 units/ml Monitor platelets by anticoagulation  protocol: Yes   Plan:  Decrease heparin gtt to 1100 units/hr Heparin level in 6 hrs Daily heparin level, INR, and CBC Monitor for s/s bleeding   Argie Ramming, PharmD Clinical Pharmacist 05/05/17 7:24 AM

## 2017-05-05 NOTE — Evaluation (Signed)
Occupational Therapy Evaluation and Discharge Patient Details Name: Edward Marquez MRN: [id number] DOB: [birth year] Today's Date: 05/05/2017    History of Present Illness Pt is 66 yo male who underwent Left above-knee popliteal to anterior tibial artery bypass with a non-reversed translocated saphenous vein graft. PMH: PVD, a-fib, CHF, gout   Clinical Impression   PTA pt VERY active and independent in ADL and mobility. Pt is currently mod I with RW for pain management and balance. Educated Pt on compensatory strategies for ADL and functional transfers. Specifically focused on bathroom, Pt will require a shower chair for safety and to reach BLE. Education complete. Pt had no questions or concerns from OT perspective, will have wife available to assist with ADL if needed. OT to sign off at this point. Thank you for the opportunity to serve this patient.     Follow Up Recommendations  No OT follow up;Supervision - Intermittent    Equipment Recommendations  Tub/shower seat (to fit all the way in the tub)    Recommendations for Other Services       Precautions / Restrictions Precautions Precautions: None Restrictions Weight Bearing Restrictions: No      Mobility Bed Mobility               General bed mobility comments: Pt sitting OOB in chair upon arrival  Transfers Overall transfer level: Modified independent Equipment used: None             General transfer comment: RW in front, but not necessary for transfer    Balance Overall balance assessment: No apparent balance deficits (not formally assessed)                                         ADL either performed or assessed with clinical judgement   ADL Overall ADL's : Needs assistance/impaired Eating/Feeding: Independent   Grooming: Modified independent;Sitting Grooming Details (indicate cue type and reason): talked about compensating by performing seated, Pt had already performed prior to OT  entry Upper Body Bathing: Modified independent   Lower Body Bathing: Modified independent;Sitting/lateral leans Lower Body Bathing Details (indicate cue type and reason): From seated position, pt is able to reach BLE for bathing Upper Body Dressing : Modified independent   Lower Body Dressing: Min guard;Sit to/from stand Lower Body Dressing Details (indicate cue type and reason): min guadr for safety this session, expect to progress quickly Toilet Transfer: Supervision/safety;Ambulation;Comfort height toilet;Grab bars;RW Armed forces technical officer Details (indicate cue type and reason): RW for pain management. min vc for safety with RW - educatin provided Toileting- Clothing Manipulation and Hygiene: Supervision/safety;Sit to/from stand   Tub/ Shower Transfer: Tub transfer;Min guard;Rolling walker;Shower seat;Anterior/posterior Tub/Shower Transfer Details (indicate cue type and reason): educated in AP transfer for tub, Pt will require shower chair for safety during bathing Functional mobility during ADLs: Supervision/safety;Rolling walker General ADL Comments: Pt will have wife available for LB dressing upon discharge     Vision Patient Visual Report: No change from baseline       Perception     Praxis      Pertinent Vitals/Pain Pain Assessment: Faces Faces Pain Scale: Hurts little more Pain Location: LLE Pain Descriptors / Indicators: Constant;Throbbing Pain Intervention(s): Limited activity within patient's tolerance;Repositioned     Hand Dominance     Extremity/Trunk Assessment Upper Extremity Assessment Upper Extremity Assessment: Overall WFL for tasks assessed   Lower Extremity Assessment Lower Extremity  Assessment: LLE deficits/detail LLE Deficits / Details: numbness L foot LLE Sensation: decreased light touch;decreased proprioception   Cervical / Trunk Assessment Cervical / Trunk Assessment: Normal   Communication Communication Communication: No difficulties    Cognition Arousal/Alertness: Awake/alert Behavior During Therapy: WFL for tasks assessed/performed Overall Cognitive Status: Within Functional Limits for tasks assessed                                     General Comments  pt had a valve replacement in the past and will be here until 9/26 likely for INR to become therapeutic again    Exercises     Shoulder Instructions      Home Living Family/patient expects to be discharged to:: Private residence Living Arrangements: Spouse/significant other Available Help at Discharge: Family;Available PRN/intermittently Type of Home: House Home Access: Stairs to enter [employer] of Steps: 12 (but can go in front with 4) Entrance Stairs-Rails: Right Home Layout: Two level;1/2 bath on main level;Bed/bath upstairs     Bathroom Shower/Tub: Tub/shower unit;Curtain;Other (comment) (walk in shower with bench under construction)   Bathroom Toilet: Handicapped height     Home Equipment: None   Additional Comments: pt's wife works from home but she travels quite a bit      Prior Functioning/Environment Level of Independence: Independent        Comments: pt very active: horseback riding, managing farm, kayaking, sailing        OT Problem List: Impaired balance (sitting and/or standing);Decreased range of motion;Pain      OT Treatment/Interventions:      OT Goals(Current goals can be found in the care plan section) Acute Rehab OT Goals Patient Stated Goal: return home, be able to snow ski in March and kayak/ horseback ride in Feb OT Goal Formulation: With patient Time For Goal Achievement: 05/12/17 Potential to Achieve Goals: Good  OT Frequency:     Barriers to D/C:            Co-evaluation              AM-PAC PT "6 Clicks" Daily Activity     Outcome Measure Help from another person eating meals?: None Help from another person taking care of personal grooming?: None Help from another person  toileting, which includes using toliet, bedpan, or urinal?: None Help from another person bathing (including washing, rinsing, drying)?: None Help from another person to put on and taking off regular upper body clothing?: None Help from another person to put on and taking off regular lower body clothing?: A Little 6 Click Score: 23   End of Session Equipment Utilized During Treatment: Gait belt;Rolling walker Nurse Communication: Mobility status  Activity Tolerance: Patient tolerated treatment well Patient left: in chair;with call bell/phone within reach;Other (comment) (MDs entering room)  OT Visit Diagnosis: Unsteadiness on feet (R26.81);Other abnormalities of gait and mobility (R26.89);Pain Pain - Right/Left: Left Pain - part of body: Leg                Time: 2831-5176 OT Time Calculation (min): 20 min Charges:  OT General Charges $OT Visit: 1 Visit OT Evaluation $OT Eval Moderate Complexity: 1 Mod G-Codes:     Hulda Humphrey OTR/L [place] 05/05/2017, 11:25 AM

## 2017-05-05 NOTE — Anesthesia Postprocedure Evaluation (Signed)
Anesthesia Post Note  Patient: Edward Marquez  Procedure(s) Performed: Procedure(s) (LRB): LEFT POPLITEAL TO ANTERIOR TIBIAL ARTERY BYPASS WITH VEIN (Left) POPITEAL VEIN HARVEST (Left)     Anesthesia Post Evaluation  Last Vitals:  Vitals:   05/04/17 2330 05/05/17 0431  BP: (!) 108/58 122/78  Pulse: 71 60  Resp: 16 14  Temp: 36.7 C 36.6 C  SpO2: 93% 97%    Last Pain:  Vitals:   05/05/17 0600  TempSrc:   PainSc: 0-No pain                 Lynda Rainwater

## 2017-05-05 NOTE — Progress Notes (Signed)
ANTICOAGULATION CONSULT NOTE - Follow Up Consult  Pharmacy Consult for heparin Indication: AVR/Afib  Labs:  Recent Labs  05/03/17 0217 05/04/17 0425 05/04/17 0939 05/04/17 1418  05/05/17 0511 05/05/17 1358 05/05/17 2158  HGB 14.4  --  12.2*  --   --  11.3* 12.7*  --   HCT 42.0  --  36.2*  --   --  32.5* 37.7*  --   PLT 183  --  148*  --   --  104* 145*  --   LABPROT 15.7*  --   --   --   --  15.9*  --   --   INR 1.26  --   --   --   --  1.28  --   --   HEPARINUNFRC 0.57  --  0.17*  --   < > 0.96* 0.55 0.20*  CREATININE 0.88 0.95  --  0.94  --  0.71  --   --   < > = values in this interval not displayed.   Assessment: 66yo male now below goal on heparin after one level at goal; no gtt issues or signs of bleeding per RN.  Goal of Therapy:  Heparin level 0.3-0.7 units/ml   Plan:  Will increase heparin gtt by 2 units/kg/hr to 1250 units/hr and check level in 6hr.  Wynona Neat, PharmD, BCPS  05/05/2017,11:48 PM

## 2017-05-05 NOTE — Progress Notes (Signed)
ANTICOAGULATION CONSULT NOTE - Follow Up Consult  Pharmacy Consult for Heparin Indication: mechanical AVR + afib  Allergies  Allergen Reactions  . Ivp Dye [Iodinated Diagnostic Agents] Hives   Patient Measurements: Height: 5\' 9"  (175.3 cm) Weight: 187 lb 6.4 oz (85 kg) IBW/kg (Calculated) : 70.7 Heparin Dosing Weight: 84.2 kg  Vital Signs: Temp: 97.8 F (36.6 C) (09/23 0431) Temp Source: Oral (09/23 0431) BP: 122/78 (09/23 0431) Pulse Rate: 60 (09/23 0431)  Labs:  Recent Labs  05/03/17 0217 05/04/17 0425 05/04/17 0939 05/04/17 1418 05/04/17 1837 05/05/17 0511  HGB 14.4  --  12.2*  --   --  11.3*  HCT 42.0  --  36.2*  --   --  32.5*  PLT 183  --  148*  --   --  104*  LABPROT 15.7*  --   --   --   --  15.9*  INR 1.26  --   --   --   --  1.28  HEPARINUNFRC 0.57  --  0.17*  --  0.54 0.96*  CREATININE 0.88 0.95  --  0.94  --  0.71    Estimated Creatinine Clearance: 99.5 mL/min (by C-G formula based on SCr of 0.71 mg/dL).  Medications:  Scheduled:  . docusate sodium  100 mg Oral Daily  . dofetilide  500 mcg Oral BID  . pantoprazole  40 mg Oral Daily  . sodium chloride flush  3 mL Intravenous Q12H  . sodium chloride flush  3 mL Intravenous Q12H  . Warfarin - Pharmacist Dosing Inpatient   Does not apply q1800   Infusions:  . sodium chloride    . sodium chloride 75 mL/hr (05/03/17 1746)  . sodium chloride    . heparin 1,100 Units/hr (05/05/17 1962)  . magnesium sulfate 1 - 4 g bolus IVPB     Assessment: 66 yo M on Coumadin PTA for mechanical valve which was held for VVS procedure.  Pt s/p procedure 9/21 with heparin resumed ~0100 on 9/22.  CBC continues to trend down 14.4>12.2>11.3, PLT down from yesterday 148>104. Warfarin resumed yesterday. Provider reports mild bloody drainage from proximal incision, but okay to continue anticoagulation at this time.   Heparin level: 0.96 rate addressed/decreased overnight by RPh INR: 1.28  Goal of Therapy:  Heparin level  0.3-0.7 units/ml Monitor platelets by anticoagulation protocol: Yes   Plan:  Warfarin 7.5mg  x1 tonight  Heparin gtt 1100 units/hr   Heparin level at 1330  Heparin level and CBC daily while on heparin.  Georga Bora, PharmD Clinical Pharmacist 05/05/2017 10:22 AM

## 2017-05-05 NOTE — Progress Notes (Addendum)
Pharmacy Review for Dofetilide (Tikosyn) Initiation  Admit Complaint: 66 y.o. male admitted 04/30/2017 with atrial fibrillation to be initiated on dofetilide.   Assessment:  Patient Exclusion Criteria: If any screening criteria checked as "Yes", then  patient  should NOT receive dofetilide until criteria item is corrected. If "Yes" please indicate correction plan.  YES  NO Patient  Exclusion Criteria Correction Plan  [x]  []  Baseline QTc interval is greater than or equal to 440 msec. IF above YES box checked dofetilide contraindicated unless patient has ICD; then may proceed if QTc 500-550 msec or with known ventricular conduction abnormalities may proceed with QTc 550-600 msec. QTc = 513 05/04/17 02/08/18 cardiac defibrillator in place per [facility] note  []  [x]  Magnesium level is less than 1.8 mEq/l : Last magnesium:  Lab Results  Component Value Date   MG 2.0 05/05/2017         [x]  []  Potassium level is less than 4 mEq/l : Last potassium:  Lab Results  Component Value Date   K 3.9 05/05/2017       Spoke with RN to give PRN oral replacement for K<4.0  []  [x]  Patient is known or suspected to have a digoxin level greater than 2 ng/ml: No results found for: DIGOXIN    []  [x]  Creatinine clearance less than 20 ml/min (calculated using Cockcroft-Gault, actual body weight and serum creatinine): Estimated Creatinine Clearance: 99.5 mL/min (by C-G formula based on SCr of 0.71 mg/dL).    []  [x]  Patient has received drugs known to prolong the QT intervals within the last 48 hours (phenothiazines, tricyclics or tetracyclic antidepressants, erythromycin, H-1 antihistamines, cisapride, fluoroquinolones, azithromycin). Drugs not listed above may have an, as yet, undetected potential to prolong the QT interval, updated information on QT prolonging agents is available at this website:QT prolonging agents   []  [x]  Patient received a dose of hydrochlorothiazide (Oretic) alone or in any combination including  triamterene (Dyazide, Maxzide) in the last 48 hours.   []  [x]  Patient received a medication known to increase dofetilide plasma concentrations prior to initial dofetilide dose:  . Trimethoprim (Primsol, Proloprim) in the last 36 hours . Verapamil (Calan, Verelan) in the last 36 hours or a sustained release dose in the last 72 hours . Megestrol (Megace) in the last 5 days  . Cimetidine (Tagamet) in the last 6 hours . Ketoconazole (Nizoral) in the last 24 hours . Itraconazole (Sporanox) in the last 48 hours  . Prochlorperazine (Compazine) in the last 36 hours    []  [x]  Patient is known to have a history of torsades de pointes; congenital or acquired long QT syndromes.   []  [x]  Patient has received a Class 1 antiarrhythmic with less than 2 half-lives since last dose. (Disopyramide, Quinidine, Procainamide, Lidocaine, Mexiletine, Flecainide, Propafenone)   []  [x]  Patient has received amiodarone therapy in the past 3 months or amiodarone level is greater than 0.3 ng/ml.    Patient has been appropriately anticoagulated with heparin/warfarin  Ordering provider was confirmed at [email or web address] if they are not listed on the [place] Prescribers list.  Goal of Therapy: Follow renal function, electrolytes, potential drug interactions, and dose adjustment. Provide education and 1 week supply at discharge.  Plan:  [x]   Physician selected initial dose within range recommended for patients level of renal function - will monitor for response.  []   Physician selected initial dose outside of range recommended for patients level of renal function - will discuss if the dose should be altered at this time.  Select One Calculated CrCl  Dose q12h  [x]  > 60 ml/min 500 mcg  []  40-60 ml/min 250 mcg  []  20-40 ml/min 125 mcg   2. Follow up QTc after the first 5 doses, renal function, electrolytes (K & Mg) daily x 3     days, dose adjustment, success of initiation and facilitate 1 week discharge  supply as     clinically indicated.  3. Initiate Tikosyn education video (Call [phone] and ask for video # [phone]).  4. Place Enrollment Form on the chart for discharge supply of dofetilide.    Jodean Lima Kregg Cihlar 10:36 AM 05/05/2017

## 2017-05-05 NOTE — Progress Notes (Signed)
ANTICOAGULATION CONSULT NOTE - Follow Up Consult  Pharmacy Consult for Heparin Indication: mechanical AVR + afib  Allergies  Allergen Reactions  . Ivp Dye [Iodinated Diagnostic Agents] Hives   Patient Measurements: Height: 5\' 9"  (175.3 cm) Weight: 187 lb 6.4 oz (85 kg) IBW/kg (Calculated) : 70.7 Heparin Dosing Weight: 84.2 kg  Vital Signs: Temp: 97.8 F (36.6 C) (09/23 0431) Temp Source: Oral (09/23 0431) BP: 122/78 (09/23 0431) Pulse Rate: 60 (09/23 0431)  Labs:  Recent Labs  05/03/17 0217 05/04/17 0425 05/04/17 0939 05/04/17 1418 05/04/17 1837 05/05/17 0511 05/05/17 1358  HGB 14.4  --  12.2*  --   --  11.3* 12.7*  HCT 42.0  --  36.2*  --   --  32.5* 37.7*  PLT 183  --  148*  --   --  104* 145*  LABPROT 15.7*  --   --   --   --  15.9*  --   INR 1.26  --   --   --   --  1.28  --   HEPARINUNFRC 0.57  --  0.17*  --  0.54 0.96* 0.55  CREATININE 0.88 0.95  --  0.94  --  0.71  --     Estimated Creatinine Clearance: 99.5 mL/min (by C-G formula based on SCr of 0.71 mg/dL).  Medications:  Scheduled:  . docusate sodium  100 mg Oral Daily  . dofetilide  500 mcg Oral BID  . pantoprazole  40 mg Oral Daily  . sodium chloride flush  3 mL Intravenous Q12H  . sodium chloride flush  3 mL Intravenous Q12H  . warfarin  7.5 mg Oral ONCE-1800  . Warfarin - Pharmacist Dosing Inpatient   Does not apply q1800   Infusions:  . sodium chloride    . sodium chloride 75 mL/hr (05/03/17 1746)  . sodium chloride    . heparin 1,100 Units/hr (05/05/17 2440)  . magnesium sulfate 1 - 4 g bolus IVPB     Assessment: 66 yo M on Coumadin PTA for mechanical valve which was held for VVS procedure.  Pt s/p procedure 9/21 with heparin resumed ~0100 on 9/22.  Warfarin resumed yesterday. Provider reports mild bloody drainage from proximal incision, but okay to continue anticoagulation at this time.   Heparin level: 0.55 - repeat CBC stable; no additional bleeding reported INR: 1.28   Goal of  Therapy:  Heparin level 0.3-0.7 units/ml Monitor platelets by anticoagulation protocol: Yes   Plan:  Warfarin 7.5mg  x1 tonight  Continue Heparin gtt at 1100 units/hr   Heparin level and CBC daily while on heparin.  Georga Bora, PharmD Clinical Pharmacist 05/05/2017 3:29 PM

## 2017-05-05 NOTE — Progress Notes (Signed)
Progress Note    05/05/2017 9:14 AM 2 Days Post-Op  Subjective:  Says his heel was hurting from the ace wrap.  He does c/o swelling in his leg.  Afebrile HR  60's-80's 956'O-130'Q systolic 65% RA  Vitals:   05/04/17 2330 05/05/17 0431  BP: (!) 108/58 122/78  Pulse: 71 60  Resp: 16 14  Temp: 98.1 F (36.7 C) 97.8 F (36.6 C)  SpO2: 93% 97%    Physical Exam: Cardiac:  regular Lungs:  Non labored Incisions:  All incisions are healing nicely; mild bloody drainage from proximal incision Extremities:  brisk doppler signals peroneal >DP/PT; motor and sensation are in tact. Mild serous drainage from anterior wound.  Left heel with dry skin-no evidence of any breakdown.  Calf is soft and non tender   CBC    Component Value Date/Time   WBC 10.9 (H) 05/05/2017 0511   RBC 3.38 (L) 05/05/2017 0511   HGB 11.3 (L) 05/05/2017 0511   HCT 32.5 (L) 05/05/2017 0511   PLT 104 (L) 05/05/2017 0511   MCV 96.2 05/05/2017 0511   MCH 33.4 05/05/2017 0511   MCHC 34.8 05/05/2017 0511   RDW 13.7 05/05/2017 0511   LYMPHSABS 1.4 04/30/2017 1154   MONOABS 0.7 04/30/2017 1154   EOSABS 0.6 04/30/2017 1154   BASOSABS 0.1 04/30/2017 1154    BMET    Component Value Date/Time   NA 136 05/05/2017 0511   K 3.9 05/05/2017 0511   CL 108 05/05/2017 0511   CO2 17 (L) 05/05/2017 0511   GLUCOSE 103 (H) 05/05/2017 0511   BUN 16 05/05/2017 0511   CREATININE 0.71 05/05/2017 0511   CALCIUM 8.4 (L) 05/05/2017 0511   GFRNONAA >60 05/05/2017 0511   GFRAA >60 05/05/2017 0511    INR    Component Value Date/Time   INR 1.28 05/05/2017 0511     Intake/Output Summary (Last 24 hours) at 05/05/17 0914 Last data filed at 05/05/17 0830  Gross per 24 hour  Intake             1476 ml  Output             2500 ml  Net            -1024 ml     Assessment:  66 y.o. male is s/p:  Left above-knee popliteal to anterior tibial artery bypass with a non-reversed translocated saphenous vein graft   2 Days  Post-Op  Plan: -pt doing well this am with brisk doppler signals peroneal >DP/PT -incisions are healing nicely-mild bloody drainage from proximal incision.  Will continue to monitor. -he does have some edema in the LLE with 1+ pitting edema, which is not unexpected.  He wants to leave ace wrap off for a little while.  I discussed with him the importance of gentle compression and leg elevation.  Also discussed floating his heels off the bed -he is ambulating in the halls -DVT prophylaxis:  Heparin to coumadin bridge for mechanical AVR -tikosyn is being restarted per protocol. -wet to dry dressing to left leg wound   Leontine Locket, PA-C [facility] [phone] 05/05/2017 9:14 AM  I agree with the above.  I have seen and evaluated the patient.  He does have a small area of bleeding near the upper medial incision.  We will continue to monitor this.  He is on IV heparin and Coumadin for his aortic valve.  He has excellent Doppler signals in his foot.  There is some  edema and his leg which he is keeping elevated.  There is a small ulcer which we will treat with wet-to-dry dressings.  Annamarie Major

## 2017-05-06 ENCOUNTER — Other Ambulatory Visit: Payer: Self-pay

## 2017-05-06 ENCOUNTER — Encounter (HOSPITAL_COMMUNITY): Payer: Medicare Other

## 2017-05-06 ENCOUNTER — Encounter (HOSPITAL_COMMUNITY): Payer: Self-pay | Admitting: Vascular Surgery

## 2017-05-06 LAB — BASIC METABOLIC PANEL
Anion gap: 7 (ref 5–15)
BUN: 11 mg/dL (ref 6–20)
CALCIUM: 9.1 mg/dL (ref 8.9–10.3)
CO2: 29 mmol/L (ref 22–32)
CREATININE: 0.87 mg/dL (ref 0.61–1.24)
Chloride: 101 mmol/L (ref 101–111)
GFR calc non Af Amer: >60 mL/min (ref >60)
Glucose, Bld: 110 mg/dL — ABNORMAL HIGH (ref 65–99)
Potassium: 4.3 mmol/L (ref 3.5–5.1)
SODIUM: 137 mmol/L (ref 135–145)

## 2017-05-06 LAB — PROTIME-INR
INR: 1.23
PROTHROMBIN TIME: 15.4 s — AB (ref 11.4–15.2)

## 2017-05-06 LAB — CBC
HCT: 40.7 % (ref 39.0–52.0)
Hemoglobin: 13.6 g/dL (ref 13.0–17.0)
MCH: 32.2 pg (ref 26.0–34.0)
MCHC: 33.4 g/dL (ref 30.0–36.0)
MCV: 96.4 fL (ref 78.0–100.0)
PLATELETS: 149 10*3/uL — AB (ref 150–400)
RBC: 4.22 MIL/uL (ref 4.22–5.81)
RDW: 13.3 % (ref 11.5–15.5)
WBC: 10.1 10*3/uL (ref 4.0–10.5)

## 2017-05-06 LAB — HEPARIN LEVEL (UNFRACTIONATED): HEPARIN UNFRACTIONATED: 0.33 [IU]/mL (ref 0.30–0.70)

## 2017-05-06 LAB — MAGNESIUM: Magnesium: 1.9 mg/dL (ref 1.7–2.4)

## 2017-05-06 SURGERY — Surgical Case
Anesthesia: *Unknown

## 2017-05-06 MED ORDER — DILTIAZEM HCL ER COATED BEADS 120 MG PO CP24
120.0000 mg | ORAL_CAPSULE | Freq: Every day | ORAL | Status: DC
  Administered 2017-05-06 – 2017-05-09 (×4): 120 mg via ORAL
  Filled 2017-05-06 (×4): qty 1

## 2017-05-06 MED ORDER — RAMIPRIL 2.5 MG PO CAPS
2.5000 mg | ORAL_CAPSULE | Freq: Every day | ORAL | Status: DC
  Administered 2017-05-06 – 2017-05-09 (×4): 2.5 mg via ORAL
  Filled 2017-05-06 (×4): qty 1

## 2017-05-06 MED ORDER — BISACODYL 5 MG PO TBEC: 10.0000 mg | DELAYED_RELEASE_TABLET | Freq: Every day | ORAL | Status: DC | PRN

## 2017-05-06 MED ORDER — WARFARIN SODIUM 7.5 MG PO TABS: 7.5000 mg | ORAL_TABLET | Freq: Once | ORAL | Status: DC

## 2017-05-06 MED ORDER — METOPROLOL SUCCINATE ER 100 MG PO TB24
100.0000 mg | ORAL_TABLET | Freq: Every day | ORAL | Status: DC
  Administered 2017-05-06 – 2017-05-09 (×4): 100 mg via ORAL
  Filled 2017-05-06 (×4): qty 1

## 2017-05-06 MED ORDER — WARFARIN SODIUM 10 MG PO TABS
10.0000 mg | ORAL_TABLET | Freq: Once | ORAL | Status: AC
  Administered 2017-05-06: 10 mg via ORAL
  Filled 2017-05-06: qty 1

## 2017-05-06 NOTE — Progress Notes (Signed)
ANTICOAGULATION CONSULT NOTE - Follow Up Consult  Pharmacy Consult for Heparin and Coumadin  Indication: atrial fibrillation and mechanical AVR  Allergies  Allergen Reactions  . Ivp Dye [Iodinated Diagnostic Agents] Hives    Patient Measurements: Height: 5\' 9"  (175.3 cm) Weight: 187 lb 6.4 oz (85 kg) IBW/kg (Calculated) : 70.7 Heparin Dosing Weight: 85 kg  Vital Signs: Temp: 97.5 F (36.4 C) (09/24 0912) Temp Source: Oral (09/24 0912) BP: 146/94 (09/24 0912) Pulse Rate: 111 (09/24 0912)  Labs:  Recent Labs  05/04/17 1418  05/05/17 0511 05/05/17 1358 05/05/17 2158 05/06/17 0544  HGB  --   --  11.3* 12.7*  --  13.6  HCT  --   --  32.5* 37.7*  --  40.7  PLT  --   --  104* 145*  --  149*  LABPROT  --   --  15.9*  --   --  15.4*  INR  --   --  1.28  --   --  1.23  HEPARINUNFRC  --   < > 0.96* 0.55 0.20* 0.33  CREATININE 0.94  --  0.71  --   --  0.87  < > = values in this interval not displayed.  Estimated Creatinine Clearance: 91.5 mL/min (by C-G formula based on SCr of 0.87 mg/dL).  Assessment:  66 yr old male on Coumadin PTA for mechanical valve which was held for VVS procedure.  s/p procedure 9/21 with heparin resumed ~0100 on 9/22 and Coumadin resumed on 9/22 pm.  Prior mild bloody drainage from proximal incision, now with dried blood on bandage.    Heparin level is low therapeutic (0.33) on 1250 units/hr.   INR only 1.23 after Coumadin 7.5 mg daily x 2 days.    PTA Coumadin regimen:  7.5 mg daily except 3.75 mg on Tuesdays.    Home Tikosyn 500 mcg PO BID resumed on 9/23.  K+ 4.3, Mag 1.9. Baseline QTc 513; QTc 495 on 9/23.  Has ICD.     Has been off home Diltiazem CD, Toprol and Ramipril since post-op 9/21.  Patient asking about when meds will resume.  Goal of Therapy:  Heparin level 0.3-0.7 units/ml INR 2.5 - 3.5 Monitor platelets by anticoagulation protocol: Yes   Plan:   Increase heparin drip from 1250 to 1300 units/hr to try to keep level in target  range.  Increase Coumadin to 10 mg x 1 today.  Discussed with patient.  Daily heparin level, PT/INR and CBC.  Follow up resuming home BP meds.  Arty Baumgartner, [place] Pager: [phone] 05/06/2017,10:43 AM

## 2017-05-06 NOTE — Care Management Important Message (Signed)
Important Message  Patient Details  Name: Edward Marquez MRN: [id number] Date of Birth: [birth year]   Medicare Important Message Given:  Yes    Nathen May 05/06/2017, 10:13 AM

## 2017-05-06 NOTE — Progress Notes (Signed)
Physical Therapy Treatment Patient Details Name: Edward Marquez MRN: [id number] DOB: April 28, 1951 Today's Date: 05/06/2017    History of Present Illness Pt is 66 yo male who underwent Left above-knee popliteal to anterior tibial artery bypass with a non-reversed translocated saphenous vein graft. PMH: PVD, a-fib, CHF, gout    PT Comments    Patient continues to make progress toward mobility goals and suspect when L LE edema decreases pt will mobilize well. Overall mod I/supervision for functional transfers and gait. Pt will continue to benefit from skilled PT in acute setting. Current plan remains appropriate.    Follow Up Recommendations  No PT follow up     Equipment Recommendations  Rolling walker with 5" wheels    Recommendations for Other Services       Precautions / Restrictions Precautions Precautions: None    Mobility  Bed Mobility Overal bed mobility: Independent             General bed mobility comments: Pt sitting OOB in chair upon arrival  Transfers Overall transfer level: Modified independent Equipment used: None             General transfer comment: pt able to transfer without AD  Ambulation/Gait Ambulation/Gait assistance: Supervision Ambulation Distance (Feet): 400 Feet Assistive device: Rolling walker (2 wheeled) Gait Pattern/deviations: Step-through pattern;Decreased stride length;Decreased weight shift to left;Decreased stance time - left;Decreased dorsiflexion - left;Antalgic Gait velocity: decreased   General Gait Details: pt is able to ambulate without AD however RW needed for safety with gait due to decreased sensation/ROM and pain L LE; cues for L heel strike   Stairs            Wheelchair Mobility    Modified Rankin (Stroke Patients Only)       Balance Overall balance assessment: No apparent balance deficits (not formally assessed)                                          Cognition Arousal/Alertness:  Awake/alert Behavior During Therapy: WFL for tasks assessed/performed Overall Cognitive Status: Within Functional Limits for tasks assessed                                        Exercises General Exercises - Lower Extremity Ankle Circles/Pumps: AROM;Left;15 reps;Seated Toe Raises: AROM;Left;10 reps;Standing Other Exercises Other Exercises: standing calf stretch    General Comments        Pertinent Vitals/Pain Pain Assessment: Faces Faces Pain Scale: Hurts a little bit Pain Location: LLE Pain Descriptors / Indicators: Throbbing;Aching Pain Intervention(s): Limited activity within patient's tolerance;Monitored during session;Repositioned;Premedicated before session    Home Living                      Prior Function            PT Goals (current goals can now be found in the care plan section) Acute Rehab PT Goals Patient Stated Goal: return home, be able to snow ski in March and kayak/ horseback ride in Feb PT Goal Formulation: With patient Time For Goal Achievement: 05/18/17 Potential to Achieve Goals: Good Progress towards PT goals: Progressing toward goals    Frequency    Min 3X/week      PT Plan Current plan remains appropriate    Co-evaluation  AM-PAC PT "6 Clicks" Daily Activity  Outcome Measure  Difficulty turning over in bed (including adjusting bedclothes, sheets and blankets)?: None Difficulty moving from lying on back to sitting on the side of the bed? : None Difficulty sitting down on and standing up from a chair with arms (e.g., wheelchair, bedside commode, etc,.)?: None Help needed moving to and from a bed to chair (including a wheelchair)?: None Help needed walking in hospital room?: None Help needed climbing 3-5 steps with a railing? : A Little 6 Click Score: 23    End of Session Equipment Utilized During Treatment: Gait belt Activity Tolerance: Patient tolerated treatment well Patient left: in  chair;with call bell/phone within reach Nurse Communication: Mobility status PT Visit Diagnosis: Difficulty in walking, not elsewhere classified (R26.2);Pain Pain - Right/Left: Left Pain - part of body: Leg     Time: 9753-0051 PT Time Calculation (min) (ACUTE ONLY): 22 min  Charges:  $Gait Training: 8-22 mins                    G Codes:       Earney Navy, PTA Pager: [phone]     Darliss Cheney 05/06/2017, 3:37 PM

## 2017-05-06 NOTE — Care Management Note (Addendum)
Case Management Note [name] RN, BSN Unit 4E-Case Manager [phone]  Patient Details  Name: Edward Marquez MRN: [id number] Date of Birth: January 18, 1951  Subjective/Objective:   Pt admitted s/p Left above-knee popliteal to anterior tibial artery bypass                 Action/Plan: PTA pt lived at home with wife- per PT eval no f/u recommendations made- anticipate return home- Pt was on Tikosyn prior to admission- CM to follow.   Expected Discharge Date:                 Expected Discharge Plan:  Home/Self Care  In-House Referral:     Discharge planning Services  CM Consult  Post Acute Care Choice:    Choice offered to:     DME Arranged:    DME Agency:     HH Arranged:    HH Agency:     Status of Service:  In process, will continue to follow  If discussed at Long Length of Stay Meetings, dates discussed:    Discharge Disposition:   Additional Comments:  [name], RN 05/06/2017, 10:44 AM

## 2017-05-06 NOTE — Progress Notes (Addendum)
[facility]   Subjective  - Left foot and leg feel better today.  States he has increased sensation.  Objective 99/75 (!) 101 98.2 F (36.8 C) (Oral) 12 97%  Intake/Output Summary (Last 24 hours) at 05/06/17 4259 Last data filed at 05/06/17 0600  Gross per 24 hour  Intake          1117.85 ml  Output              350 ml  Net           767.85 ml    Left leg incisions healing well, erythema and mild edema Doppler DP/AT signal left Heart A fib Lungs non labored breathing  Assessment/Planning: POD # 3 Left above-knee popliteal to anterior tibial artery bypass with a non-reversed translocated saphenous vein graft  Pending INR therapeutic  1.23 today Patient states possible cardioversion?   Laurence Slate [facility] 05/06/2017 7:28 AM --  Laboratory Lab Results:  Recent Labs  05/05/17 1358 05/06/17 0544  WBC 11.7* 10.1  HGB 12.7* 13.6  HCT 37.7* 40.7  PLT 145* 149*   BMET  Recent Labs  05/04/17 1418 05/05/17 0511  NA 137 136  K 4.8 3.9  CL 106 108  CO2 25 17*  GLUCOSE 135* 103*  BUN 19 16  CREATININE 0.94 0.71  CALCIUM 8.9 8.4*    COAG Lab Results  Component Value Date   INR 1.23 05/06/2017   INR 1.28 05/05/2017   INR 1.26 05/03/2017   No results found for: PTT  I have interviewed the patient and examined the patient. I agree with the findings by the PA. In A. fib but his rate is well controlled and he is on anticoagulation. He would like to follow up with his cardiologist as an outpatient. He does not have a cardiologist in [place].  Gae Gallop, MD [phone]

## 2017-05-07 ENCOUNTER — Inpatient Hospital Stay (HOSPITAL_COMMUNITY): Payer: Medicare Other

## 2017-05-07 ENCOUNTER — Other Ambulatory Visit: Payer: Self-pay

## 2017-05-07 DIAGNOSIS — Z95828 Presence of other vascular implants and grafts: Secondary | ICD-10-CM

## 2017-05-07 LAB — PROTIME-INR
INR: 1.43
PROTHROMBIN TIME: 17.3 s — AB (ref 11.4–15.2)

## 2017-05-07 LAB — CBC
HCT: 37.5 % — ABNORMAL LOW (ref 39.0–52.0)
Hemoglobin: 12.5 g/dL — ABNORMAL LOW (ref 13.0–17.0)
MCH: 31.9 pg (ref 26.0–34.0)
MCHC: 33.3 g/dL (ref 30.0–36.0)
MCV: 95.7 fL (ref 78.0–100.0)
PLATELETS: 166 10*3/uL (ref 150–400)
RBC: 3.92 MIL/uL — AB (ref 4.22–5.81)
RDW: 13.2 % (ref 11.5–15.5)
WBC: 8.8 10*3/uL (ref 4.0–10.5)

## 2017-05-07 LAB — BASIC METABOLIC PANEL
ANION GAP: 6 (ref 5–15)
BUN: 12 mg/dL (ref 6–20)
CO2: 29 mmol/L (ref 22–32)
Calcium: 8.9 mg/dL (ref 8.9–10.3)
Chloride: 102 mmol/L (ref 101–111)
Creatinine, Ser: 0.92 mg/dL (ref 0.61–1.24)
Glucose, Bld: 125 mg/dL — ABNORMAL HIGH (ref 65–99)
Potassium: 4.3 mmol/L (ref 3.5–5.1)
SODIUM: 137 mmol/L (ref 135–145)

## 2017-05-07 LAB — HEPARIN LEVEL (UNFRACTIONATED): Heparin Unfractionated: 0.31 IU/mL (ref 0.30–0.70)

## 2017-05-07 LAB — MAGNESIUM: MAGNESIUM: 1.9 mg/dL (ref 1.7–2.4)

## 2017-05-07 MED ORDER — WARFARIN SODIUM 10 MG PO TABS
10.0000 mg | ORAL_TABLET | Freq: Once | ORAL | Status: AC
  Administered 2017-05-07: 10 mg via ORAL
  Filled 2017-05-07: qty 1

## 2017-05-07 NOTE — Progress Notes (Signed)
VASCULAR LAB PRELIMINARY  ARTERIAL  ABI completed: Right sided ABI is suggestive of mild arterial insuffiencey disease at rest. Left sided ABI - non compressible vessels.    RIGHT    LEFT    PRESSURE WAVEFORM  PRESSURE WAVEFORM  BRACHIAL 110 Triphasic BRACHIAL 105 Triphasic  DP 22 biphasic DP 255 [place] monophasic  PT 89 monophasic PT 255 [place] monophasic  PER   PER    GREAT TOE  NA GREAT TOE  NA    RIGHT LEFT  ABI       Elsi Stelzer D, RVT 05/07/2017, 2:07 PM

## 2017-05-07 NOTE — Progress Notes (Addendum)
[facility] [place]  Subjective  - Doing well, improved pain controlled and decreased rate A fib.  Objective 114/69 89 97.9 F (36.6 C) (Oral) 17 99%  Intake/Output Summary (Last 24 hours) at 05/07/17 0748 Last data filed at 05/07/17 0600  Gross per 24 hour  Intake           549.67 ml  Output                0 ml  Net           549.67 ml    Doppler DP/AT signal left Heart A fib Lungs non labored breathing  Assessment/Planning: POD # 4 Left above-knee popliteal to anterior tibial artery bypass with a non-reversed translocated saphenous vein graft  INR 1.4 today pending therapeutic INR prior to discharge home. F/U with Cardiologist out patient.  Laurence Slate [facility] 05/07/2017 7:48 AM --  Laboratory Lab Results:  Recent Labs  05/06/17 0544 05/07/17 0347  WBC 10.1 8.8  HGB 13.6 12.5*  HCT 40.7 37.5*  PLT 149* 166   BMET  Recent Labs  05/06/17 0544 05/07/17 0347  NA 137 137  K 4.3 4.3  CL 101 102  CO2 29 29  GLUCOSE 110* 125*  BUN 11 12  CREATININE 0.87 0.92  CALCIUM 9.1 8.9    COAG Lab Results  Component Value Date   INR 1.43 05/07/2017   INR 1.23 05/06/2017   INR 1.28 05/05/2017    I have interviewed the patient and examined the patient. I agree with the findings by the PA. Home when Coumadin is therapeutic.  Gae Gallop, MD [phone]

## 2017-05-07 NOTE — Progress Notes (Signed)
ANTICOAGULATION CONSULT NOTE - Follow Up Consult  Pharmacy Consult for Heparin and Coumadin  Indication: atrial fibrillation and mechanical AVR  Allergies  Allergen Reactions  . Ivp Dye [Iodinated Diagnostic Agents] Hives    Patient Measurements: Height: 5\' 9"  (175.3 cm) Weight: 181 lb 3.2 oz (82.2 kg) IBW/kg (Calculated) : 70.7 Heparin Dosing Weight: 82 kg  Vital Signs: Temp: 98.4 F (36.9 C) (09/25 1144) Temp Source: Oral (09/25 1144) BP: 99/71 (09/25 1144) Pulse Rate: 83 (09/25 1144)  Labs:  Recent Labs  05/05/17 0511 05/05/17 1358 05/05/17 2158 05/06/17 0544 05/07/17 0347  HGB 11.3* 12.7*  --  13.6 12.5*  HCT 32.5* 37.7*  --  40.7 37.5*  PLT 104* 145*  --  149* 166  LABPROT 15.9*  --   --  15.4* 17.3*  INR 1.28  --   --  1.23 1.43  HEPARINUNFRC 0.96* 0.55 0.20* 0.33 0.31  CREATININE 0.71  --   --  0.87 0.92    Estimated Creatinine Clearance: 80 mL/min (by C-G formula based on SCr of 0.92 mg/dL).  Assessment:  66 yr old male on Coumadin PTA for mechanical valve which was held for VVS procedure.  s/p procedure 9/21 with heparin resumed ~0100 on 9/22 and Coumadin resumed on 9/22 pm.  Prior mild bloody drainage from proximal incision, now with dried blood on bandage.    Heparin level is low therapeutic (0.31) on 1300 units/hr.   INR up to 1.43 after Coumadin 7.5 mg daily x 2 days, then 10 mg x 1.    PTA Coumadin regimen:  7.5 mg daily except 3.75 mg on Tuesdays.  Goal of Therapy:  Heparin level 0.3-0.7 units/ml INR 2.5 - 3.5 Monitor platelets by anticoagulation protocol: Yes   Plan:   Increase heparin drip from 1300 to 1350 units/hr to try to keep level in target range.  Increase Coumadin to 10 mg x 1 again today.  Discussed with patient.  Daily heparin level, PT/INR and CBC.  Arty Baumgartner, [place] Pager: [phone] 05/07/2017,11:54 AM

## 2017-05-07 NOTE — Progress Notes (Signed)
Physical Therapy Treatment Patient Details Name: Edward Marquez MRN: [id number] DOB: Jun 18, 1951 Today's Date: 05/07/2017    History of Present Illness Pt is 66 yo male who underwent Left above-knee popliteal to anterior tibial artery bypass with a non-reversed translocated saphenous vein graft. PMH: PVD, a-fib, CHF, gout    PT Comments    Patient tolerated increased gait distance and stair training well. VSS. Pt maintained balance without AD. Current plan remains appropriate.    Follow Up Recommendations  No PT follow up     Equipment Recommendations  Rolling walker with 5" wheels    Recommendations for Other Services       Precautions / Restrictions Precautions Precautions: None Restrictions Weight Bearing Restrictions: No    Mobility  Bed Mobility Overal bed mobility: Independent             General bed mobility comments: Pt sitting OOB in chair upon arrival  Transfers Overall transfer level: Modified independent Equipment used: None             General transfer comment: pt able to transfer without AD  Ambulation/Gait Ambulation/Gait assistance: Supervision Ambulation Distance (Feet): 500 Feet Assistive device: None Gait Pattern/deviations: Step-through pattern;Decreased weight shift to left;Decreased dorsiflexion - left;Antalgic;Decreased stride length;Decreased stance time - left Gait velocity: decreased   General Gait Details: cues for L heel strike and cadence; pt able to tolerate horizontal head turns and directional changes without significant gait disturbances   Stairs     Stair Management: Forwards;One rail Left;Step to pattern Number of Stairs:  (5 steps X2) General stair comments: cues for sequencing; supervision for safety  Wheelchair Mobility    Modified Rankin (Stroke Patients Only)       Balance Overall balance assessment: No apparent balance deficits (not formally assessed)                                           Cognition Arousal/Alertness: Awake/alert Behavior During Therapy: WFL for tasks assessed/performed Overall Cognitive Status: Within Functional Limits for tasks assessed                                        Exercises      General Comments        Pertinent Vitals/Pain Pain Assessment: 0-10 Pain Score: 4  Pain Location: LLE Pain Descriptors / Indicators: Numbness;Heaviness;Sore Pain Intervention(s): Limited activity within patient's tolerance;Monitored during session;Repositioned    Home Living                      Prior Function            PT Goals (current goals can now be found in the care plan section) Acute Rehab PT Goals Patient Stated Goal: return home, be able to snow ski in March and kayak/ horseback ride in Feb PT Goal Formulation: With patient Time For Goal Achievement: 05/18/17 Potential to Achieve Goals: Good Progress towards PT goals: Progressing toward goals    Frequency    Min 3X/week      PT Plan Current plan remains appropriate    Co-evaluation              AM-PAC PT "6 Clicks" Daily Activity  Outcome Measure  Difficulty turning over in bed (including adjusting bedclothes, sheets and blankets)?:  None Difficulty moving from lying on back to sitting on the side of the bed? : None Difficulty sitting down on and standing up from a chair with arms (e.g., wheelchair, bedside commode, etc,.)?: None Help needed moving to and from a bed to chair (including a wheelchair)?: None Help needed walking in hospital room?: None Help needed climbing 3-5 steps with a railing? : None 6 Click Score: 24    End of Session Equipment Utilized During Treatment: Gait belt Activity Tolerance: Patient tolerated treatment well Patient left: in chair;with call bell/phone within reach Nurse Communication: Mobility status PT Visit Diagnosis: Difficulty in walking, not elsewhere classified (R26.2);Pain Pain - Right/Left:  Left Pain - part of body: Leg     Time: 7092-9574 PT Time Calculation (min) (ACUTE ONLY): 19 min  Charges:  $Gait Training: 8-22 mins                    G Codes:       Earney Navy, PTA Pager: [phone]     Darliss Cheney 05/07/2017, 10:32 AM

## 2017-05-08 ENCOUNTER — Ambulatory Visit: Payer: Medicare Other | Admitting: Internal Medicine

## 2017-05-08 ENCOUNTER — Encounter: Payer: Self-pay | Admitting: Vascular Surgery

## 2017-05-08 LAB — CBC
HEMATOCRIT: 39.4 % (ref 39.0–52.0)
HEMOGLOBIN: 13.5 g/dL (ref 13.0–17.0)
MCH: 32.8 pg (ref 26.0–34.0)
MCHC: 34.3 g/dL (ref 30.0–36.0)
MCV: 95.9 fL (ref 78.0–100.0)
Platelets: 208 10*3/uL (ref 150–400)
RBC: 4.11 MIL/uL — ABNORMAL LOW (ref 4.22–5.81)
RDW: 13.3 % (ref 11.5–15.5)
WBC: 10.9 10*3/uL — ABNORMAL HIGH (ref 4.0–10.5)

## 2017-05-08 LAB — HEPARIN LEVEL (UNFRACTIONATED)
Heparin Unfractionated: 0.28 IU/mL — ABNORMAL LOW (ref 0.30–0.70)
Heparin Unfractionated: 0.36 IU/mL (ref 0.30–0.70)

## 2017-05-08 LAB — PROTIME-INR
INR: 1.61
Prothrombin Time: 19 seconds — ABNORMAL HIGH (ref 11.4–15.2)

## 2017-05-08 MED ORDER — WARFARIN SODIUM 2.5 MG PO TABS
12.5000 mg | ORAL_TABLET | Freq: Once | ORAL | Status: AC
  Administered 2017-05-08: 18:00:00 12.5 mg via ORAL
  Filled 2017-05-08: qty 1

## 2017-05-08 MED ORDER — WARFARIN SODIUM 2.5 MG PO TABS: 12.5000 mg | ORAL_TABLET | Freq: Once | ORAL | Status: AC

## 2017-05-08 NOTE — Progress Notes (Signed)
ANTICOAGULATION CONSULT NOTE - Follow Up Consult  Pharmacy Consult for heaprin Indication: Afib and AVR  Labs:  Recent Labs  05/06/17 0544 05/07/17 0347 05/08/17 0422  HGB 13.6 12.5* 13.5  HCT 40.7 37.5* 39.4  PLT 149* 166 208  LABPROT 15.4* 17.3* 19.0*  INR 1.23 1.43 1.61  HEPARINUNFRC 0.33 0.31 0.28*  CREATININE 0.87 0.92  --     Assessment: 66yo male now slightly subtherapeutic on heparin after several days at low end of goal.  Goal of Therapy:  Heparin level 0.3-0.7 units/ml   Plan:  Will increase heparin gtt slightly to 1400 units/hr and check level in 6hr.  Wynona Neat, PharmD, BCPS  05/08/2017,5:44 AM

## 2017-05-08 NOTE — Progress Notes (Signed)
   VASCULAR SURGERY ASSESSMENT & PLAN:   5 Days Post-Op s/p: Left above-knee popliteal to anterior tibial artery bypass  Graft is patent. Home when Coumadin therapeutic. INR today is 1.6.  Continue ambulating.  SUBJECTIVE:   No specific complaints.   PHYSICAL EXAM:   Vitals:   05/07/17 1518 05/07/17 2143 05/08/17 0106 05/08/17 0426  BP: 94/62 110/60 117/79   Pulse: 73 63 89   Resp: 20 20 17    Temp: 98.4 F (36.9 C) 98.6 F (37 C) 98.6 F (37 C) 98.2 F (36.8 C)  TempSrc: Oral Oral Oral Oral  SpO2: 99% 95% 97%   Weight:      Height:       Incisions all look fine.  Brisk dorsalis pedis signal with Doppler.  LABS:   Lab Results  Component Value Date   WBC 10.9 (H) 05/08/2017   HGB 13.5 05/08/2017   HCT 39.4 05/08/2017   MCV 95.9 05/08/2017   PLT 208 05/08/2017   Lab Results  Component Value Date   CREATININE 0.92 05/07/2017   Lab Results  Component Value Date   INR 1.61 05/08/2017    PROBLEM LIST:    Active Problems:   PVD (peripheral vascular disease) (HCC)   PAD (peripheral artery disease) (HCC)   CURRENT MEDS:   . diltiazem  120 mg Oral Daily  . docusate sodium  100 mg Oral Daily  . dofetilide  500 mcg Oral BID  . metoprolol succinate  100 mg Oral Daily  . pantoprazole  40 mg Oral Daily  . ramipril  2.5 mg Oral Daily  . sodium chloride flush  3 mL Intravenous Q12H  . sodium chloride flush  3 mL Intravenous Q12H  . Warfarin - Pharmacist Dosing Inpatient   Does not apply [place]: [phone] Office: [phone] 05/08/2017

## 2017-05-08 NOTE — Progress Notes (Signed)
ANTICOAGULATION CONSULT NOTE - Follow Up Consult  Pharmacy Consult for Heparin and Coumadin  Indication: atrial fibrillation and mechanical AVR  Allergies  Allergen Reactions  . Ivp Dye [Iodinated Diagnostic Agents] Hives    Patient Measurements: Height: 5\' 9"  (175.3 cm) Weight: 181 lb 3.2 oz (82.2 kg) IBW/kg (Calculated) : 70.7 Heparin Dosing Weight: 82 kg  Vital Signs: Temp: 97.8 F (36.6 C) (09/26 0901) Temp Source: Oral (09/26 0901) BP: 111/57 (09/26 0901) Pulse Rate: 98 (09/26 0901)  Labs:  Recent Labs  05/06/17 0544 05/07/17 0347 05/08/17 0422 05/08/17 1140  HGB 13.6 12.5* 13.5  --   HCT 40.7 37.5* 39.4  --   PLT 149* 166 208  --   LABPROT 15.4* 17.3* 19.0*  --   INR 1.23 1.43 1.61  --   HEPARINUNFRC 0.33 0.31 0.28* 0.36  CREATININE 0.87 0.92  --   --     Estimated Creatinine Clearance: 80 mL/min (by C-G formula based on SCr of 0.92 mg/dL).  Assessment:  66 yr old male on Coumadin PTA for mechanical valve which was held for VVS procedure.  s/p procedure 9/21 with heparin resumed ~0100 on 9/22 and Coumadin resumed on 9/22 pm.  Prior mild bloody drainage from proximal incision, resolved.    Heparin level is now therapeutic (0.36) on 1400 units/hr.   INR up to 1.61 after Coumadin 7.5 mg daily x 2 days, then 10 mg x 2 days.    PTA Coumadin regimen:  7.5 mg daily except 3.75 mg on Tuesdays.  Goal of Therapy:  Heparin level 0.3-0.7 units/ml INR 2.5 - 3.5 Monitor platelets by anticoagulation protocol: Yes   Plan:   Continue heparin drip at 1400 units/hr.  Increase Coumadin to 12.5 mg x 1 today.  Discussed with patient.  Daily heparin level, PT/INR and CBC.  Arty Baumgartner, [place] Pager: [phone] 05/08/2017,1:40 PM

## 2017-05-09 ENCOUNTER — Telehealth: Payer: Self-pay | Admitting: Vascular Surgery

## 2017-05-09 DIAGNOSIS — Z23 Encounter for immunization: Secondary | ICD-10-CM | POA: Diagnosis not present

## 2017-05-09 LAB — CBC
HEMATOCRIT: 36.4 % — AB (ref 39.0–52.0)
Hemoglobin: 12.2 g/dL — ABNORMAL LOW (ref 13.0–17.0)
MCH: 32.4 pg (ref 26.0–34.0)
MCHC: 33.5 g/dL (ref 30.0–36.0)
MCV: 96.6 fL (ref 78.0–100.0)
PLATELETS: 202 10*3/uL (ref 150–400)
RBC: 3.77 MIL/uL — AB (ref 4.22–5.81)
RDW: 13.5 % (ref 11.5–15.5)
WBC: 9.2 10*3/uL (ref 4.0–10.5)

## 2017-05-09 LAB — HEPARIN LEVEL (UNFRACTIONATED): HEPARIN UNFRACTIONATED: 0.28 [IU]/mL — AB (ref 0.30–0.70)

## 2017-05-09 LAB — PROTIME-INR
INR: 1.93
Prothrombin Time: 21.9 seconds — ABNORMAL HIGH (ref 11.4–15.2)

## 2017-05-09 MED ORDER — OXYCODONE-ACETAMINOPHEN 5-325 MG PO TABS: 1.0000 | ORAL_TABLET | Freq: Four times a day (QID) | ORAL | 0 refills | Status: DC | PRN

## 2017-05-09 MED ORDER — ENOXAPARIN SODIUM 80 MG/0.8ML ~~LOC~~ SOLN: 80.0000 mg | Freq: Two times a day (BID) | SUBCUTANEOUS | 1 refills | Status: DC

## 2017-05-09 MED ORDER — WARFARIN SODIUM 2.5 MG PO TABS: 12.5000 mg | ORAL_TABLET | Freq: Once | ORAL | Status: DC

## 2017-05-09 MED ORDER — ENOXAPARIN SODIUM 80 MG/0.8ML ~~LOC~~ SOLN
80.0000 mg | Freq: Two times a day (BID) | SUBCUTANEOUS | Status: DC
  Administered 2017-05-09: 12:00:00 80 mg via SUBCUTANEOUS
  Filled 2017-05-09: qty 0.8

## 2017-05-09 NOTE — Telephone Encounter (Signed)
Sched appt 05/29/17 at 2:15. Spoke to pt.

## 2017-05-09 NOTE — Progress Notes (Addendum)
ANTICOAGULATION CONSULT NOTE - Follow Up Consult  Pharmacy Consult for Heparin and Coumadin  Indication: atrial fibrillation and mechanical AVR  Allergies  Allergen Reactions  . Ivp Dye [Iodinated Diagnostic Agents] Hives   Patient Measurements: Height: 5\' 9"  (175.3 cm) Weight: 177 lb 6.4 oz (80.5 kg) IBW/kg (Calculated) : 70.7 Heparin Dosing Weight: 82 kg  Vital Signs: Temp: 97.9 F (36.6 C) (09/27 0354) Temp Source: Oral (09/27 0354) BP: 107/80 (09/26 2151) Pulse Rate: 82 (09/26 2151)  Labs:  Recent Labs  05/07/17 0347 05/08/17 0422 05/08/17 1140 05/09/17 0456  HGB 12.5* 13.5  --  12.2*  HCT 37.5* 39.4  --  36.4*  PLT 166 208  --  202  LABPROT 17.3* 19.0*  --  21.9*  INR 1.43 1.61  --  1.93  HEPARINUNFRC 0.31 0.28* 0.36 0.28*  CREATININE 0.92  --   --   --    Estimated Creatinine Clearance: 80 mL/min (by C-G formula based on SCr of 0.92 mg/dL).  Assessment:  66 yr old male on Coumadin PTA for mechanical valve which was held for VVS procedure.  s/p procedure 9/21 with heparin resumed ~0100 on 9/22 and Coumadin resumed on 9/22 pm.  Prior mild bloody drainage from proximal incision, resolved.  Heparin level is sub-therapeutic (0.28) on 1400 units/hr. INR up to 1.93 after Coumadin restarted.  Dose increased yesterday and he has responded well.  No overt bleeding noted but H/H has dropped slightly.  Vitals remain unchanged and no clinical signs noted for bleeding.    PTA Coumadin regimen:  7.5 mg daily except 3.75 mg on Tuesdays.  Goal of Therapy:  Heparin level 0.3-0.7 units/ml INR 2.5 - 3.5 Monitor platelets by anticoagulation protocol: Yes   Plan:  Increase IV heparin drip to 1500 units/hr. - will not check confirmation since he is so near goal range. Repeat Coumadin to 12.5 mg x 1 today Daily heparin level, PT/INR and CBC Monitor closely for bleeding complications  Addendum:  IV Heparin changed to Lovenox - will start full dose therapy since renal function  is stable.  Plans for discharge as a bridge to therapy.  Would give 48-72 overlap.  Rober Minion, PharmD., MS Clinical Pharmacist Pager:  [phone] Thank you for allowing pharmacy to be part of this patients care team. 05/09/2017,8:14 AM

## 2017-05-09 NOTE — Telephone Encounter (Signed)
-----   Message from Denman George, RN sent at 05/09/2017 11:04 AM EDT ----- Regarding: needs post op f/u with Dr. Scot Dock in 2-3 weeks   ----- Message ----- From: Gabriel Earing, PA-C Sent: 05/09/2017   9:50 AM To: Vvs Charge Pool  S/p LLE bypass.  F/u with Dr. Scot Dock in 2-3 weeks.  Thanks!

## 2017-05-09 NOTE — Discharge Summary (Signed)
Discharge Summary     Edward Marquez Feb 19, 1951 66 y.o. male  [id number]  Admission Date: 04/30/2017  Discharge Date: 05/09/17  Physician: Angelia Mould, *  Admission Diagnosis: Left leg pain [M79.605]   HPI:   This is a 66 y.o. male  Who I saw in the office on 04/10/2017. He cut his left leg approximately 3-4 months ago while working in the yard. This wound was very slow to heal and ultimately he was evaluated by the wound care clinic and was undergoing aggressive wound care. He was making slow progress and was sent for vascular consultation. When I saw him in the office he had been off antibiotics. He had completed a course of po antibiotics.  Over the last several days she's noticed increasing redness in the left leg and also some discoloration of both feet which he described his somewhat red and blue. He went to the office today and was told to come to the emergency department given the progression of his symptoms.  Prior to cutting his leg he denied any real claudication, rest pain, or history of nonhealing ulcers. He was fairly active.  His risk factors for peripheral vascular disease include hypertension. He denies any history of diabetes, hypercholesterolemia, family history of premature cardiovascular disease or smoking history.  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 He has been on chronic Coumadin therapy for this and after discussion with his cardiologist, Dr. Ola Spurr, we will get a hold his Coumadin and admit him for a heparin bridge prior to proceeding with his arteriogram on Monday, 05/06/2017. He took his Coumadin last night.   Hospital Course:  The patient was admitted to the hospital and bridged off coumadin with heparin.  He was taken to the [facility] lab on 05/03/17 and underwent: Ultrasound-guided access to the right common femoral artery Aortogram with bilateral iliac arteriogram Selective catheterization  of the left superficial femoral artery with left lower extremity runoff Attempted balloon angioplasty of the left popliteal artery Retrograde right femoral arteriogram with right lower extremity runoff  Findings as follows: 1. 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 2. The infrarenal aorta, bilateral common iliac arteries, bilateral external iliac arteries and bilateral hypogastric arteries are patent. 3. On the left side, the common femoral, superficial femoral, and deep femoral arteries are patent. The above-knee popliteal artery is patent. There is an abrupt occlusion of the popliteal artery at the level of the knee. There is reconstitution of the anterior tibial artery. The tibioperoneal trunk, posterior tibial arteries are occluded.  4. On the right side, the common femoral, superficial femoral, and deep femoral arteries are patent. The popliteal artery artery is patent in the anterior tibial artery is patent. The tibial peroneal trunk, peroneal, and posterior tibial arteries on the right are occluded.  He was taken to the operating room on 05/03/2017 and underwent: Left above-knee popliteal to anterior tibial artery bypass with a non-reversed translocated saphenous vein graft    Intraoperative findings:  The anterior tibial artery was severely calcified. There was a brisk anterior tibial signal at the completion of the procedure.  The pt tolerated the procedure well and was transported to the PACU in good condition.   By POD 1, he was doing well with brisk doppler signal in the left AT.  He did have some LLE swelling and a gentle ace wrap was applied.  He had a mild leukocytosis most likely related to peri  op.  He had been restarted back on the heparin gtt and pharmacy was consulted for coumadin bridge.   By POD 2, he continued to do well.  His tikosyn was restarted per protocol.  He had excellent doppler signals in his foot.  He continued to have some edema and was  elevating his legs.    By POD 3, he had some increased sensation in his foot.  He did go into Afib and rate controlled.    On POD 4, continued heparin to coumadin bridge with INR 1.4. ABI's on 05/07/17:  RIGHT    LEFT    PRESSURE WAVEFORM  PRESSURE WAVEFORM  BRACHIAL 110 Triphasic BRACHIAL 105 Triphasic  DP 22 biphasic DP 255 Schulter monophasic  PT 89 monophasic PT 255  monophasic  PER   PER    GREAT TOE  NA GREAT TOE  NA    RIGHT LEFT  ABI     On POD 5, his graft is patent and continuing bridge.  On POD 6, he continues to do well.  His INR is 1.9 after coumadin 10mg , 10mg  and 12.5mg .  He is instructed to resume his home dose.  He is given Lovenox and heparin discontinued.  He is discharged home.  He does have an appointment at his coumadin clinic in [place] on 05/10/17 at [facility].    The remainder of the hospital course consisted of increasing mobilization and increasing intake of solids without difficulty.  CBC    Component Value Date/Time   WBC 9.2 05/09/2017 0456   RBC 3.77 (L) 05/09/2017 0456   HGB 12.2 (L) 05/09/2017 0456   HCT 36.4 (L) 05/09/2017 0456   PLT 202 05/09/2017 0456   MCV 96.6 05/09/2017 0456   MCH 32.4 05/09/2017 0456   MCHC 33.5 05/09/2017 0456   RDW 13.5 05/09/2017 0456   LYMPHSABS 1.4 04/30/2017 1154   MONOABS 0.7 04/30/2017 1154   EOSABS 0.6 04/30/2017 1154   BASOSABS 0.1 04/30/2017 1154    BMET    Component Value Date/Time   NA 137 05/07/2017 0347   K 4.3 05/07/2017 0347   CL 102 05/07/2017 0347   CO2 29 05/07/2017 0347   GLUCOSE 125 (H) 05/07/2017 0347   BUN 12 05/07/2017 0347   CREATININE 0.92 05/07/2017 0347   CALCIUM 8.9 05/07/2017 0347   GFRNONAA >60 05/07/2017 0347   GFRAA >60 05/07/2017 0347       Discharge Diagnosis:  Left leg pain [M79.605]  Secondary Diagnosis: Patient Active Problem List   Diagnosis Date Noted  . PAD (peripheral artery disease) ([place]) 05/03/2017  . PVD (peripheral vascular disease) ([place])  04/30/2017   Past Medical History:  Diagnosis Date  . Aortic valve defect   . Atrial fibrillation ([place])   . CHF (congestive heart failure) ([place])   . Gout   . Hypertension   . Peripheral vascular disease ([place])      Allergies as of 05/09/2017      Reactions   Ivp Dye [iodinated Diagnostic Agents] Hives      Medication List    TAKE these medications   diltiazem 120 MG 24 hr capsule Commonly known as:  CARDIZEM CD Take 120 mg by mouth daily.   dofetilide 500 MCG capsule Commonly known as:  TIKOSYN Take 500 mcg by mouth 2 (two) times daily.   enoxaparin 80 MG/0.8ML injection Commonly known as:  LOVENOX Inject 0.8 mLs (80 mg total) into the skin every 12 (twelve) hours.   metoprolol succinate 100 MG 24  hr tablet Commonly known as:  TOPROL-XL Take 100 mg by mouth daily. Take with or immediately following a meal.   mupirocin ointment 2 % Commonly known as:  BACTROBAN Apply 1 application topically 2 (two) times daily.   oxyCODONE-acetaminophen 5-325 MG tablet Commonly known as:  PERCOCET/ROXICET Take 1 tablet by mouth every 6 (six) hours as needed for moderate pain.   ramipril 2.5 MG capsule Commonly known as:  ALTACE Take 2.5 mg by mouth daily.   warfarin 7.5 MG tablet Commonly known as:  COUMADIN Take 3.25-7.5 mg by mouth See admin instructions. Takes 3.25mg  on Tuesday ONLY Take 7.5 mg on Sun / Mon / Wed / Thurs / Fri / Sat            Discharge Care Instructions        Start     Ordered   05/09/17 0000  enoxaparin (LOVENOX) 80 MG/0.8ML injection  Every 12 hours    Question:  Supervising Provider  Answer:  Angelia Mould   05/09/17 0941   05/09/17 0000  oxyCODONE-acetaminophen (PERCOCET/ROXICET) 5-325 MG tablet  Every 6 hours PRN    Question:  Supervising Provider  Answer:  Angelia Mould   05/09/17 1610      Discharge Instructions: [facility] Discharge instructions Lower Extremity Bypass  Surgery  Please refer to the following instruction for your post-procedure care. Your surgeon or physician assistant will discuss any changes with you.  Activity  You are encouraged to walk as much as you can. You can slowly return to normal activities during the month after your surgery. Avoid strenuous activity and heavy lifting until your doctor tells you it's OK. Avoid activities such as vacuuming or swinging a golf club. Do not drive until your doctor give the OK and you are no longer taking prescription pain medications. It is also normal to have difficulty with sleep habits, eating and bowel movement after surgery. These will go away with time.  Bathing/Showering  You may shower after you go home. Do not soak in a bathtub, hot tub, or swim until the incision heals completely.  Incision Care  Clean your incision with mild soap and water. Shower every day. Pat the area dry with a clean towel. You do not need a bandage unless otherwise instructed. Do not apply any ointments or creams to your incision. If you have open wounds you will be instructed how to care for them or a visiting nurse may be arranged for you. If you have staples or sutures along your incision they will be removed at your post-op appointment. You may have skin glue on your incision. Do not peel it off. It will come off on its own in about one week.  Wash the groin wound with soap and water daily and pat dry. (No tub bath-only shower)  Then put a dry gauze or washcloth in the groin to keep this area dry to help prevent wound infection.  Do this daily and as needed.  Do not use Vaseline or neosporin on your incisions.  Only use soap and water on your incisions and then protect and keep dry.  Diet  Resume your normal diet. There are no special food restrictions following this procedure. A low fat/ low cholesterol diet is recommended for all patients with vascular disease. In order to heal from your surgery, it is CRITICAL to  get adequate nutrition. Your body requires vitamins, minerals, and protein. Vegetables are the best source of  vitamins and minerals. Vegetables also provide the perfect balance of protein. Processed food has little nutritional value, so try to avoid this.  Medications  Resume taking all your medications unless your doctor or Physician Assistant tells you not to. If your incision is causing pain, you may take over-the-counter pain relievers such as acetaminophen (Tylenol). If you were prescribed a stronger pain medication, please aware these medication can cause nausea and constipation. Prevent nausea by taking the medication with a snack or meal. Avoid constipation by drinking plenty of fluids and eating foods with high amount of fiber, such as fruits, vegetables, and grains. Take Colace 100 mg (an over-the-counter stool softener) twice a day as needed for constipation. Do not take Tylenol if you are taking prescription pain medications.  Follow Up  Our office will schedule a follow up appointment 2-3 weeks following discharge.  Please call us immediately for any of the following conditions  .Severe or worsening pain in your legs or feet while at rest or while walking .Increase pain, redness, warmth, or drainage (pus) from your incision site(s) Fever of 101 degree or higher The swelling in your leg with the bypass suddenly worsens and becomes more painful than when you were in the hospital If you have been instructed to feel your graft pulse then you should do so every day. If you can no longer feel this pulse, call the office immediately. Not all patients are given this instruction.  Leg swelling is common after leg bypass surgery.  The swelling should improve over a few months following surgery. To improve the swelling, you may elevate your legs above the level of your heart while you are sitting or resting. Your surgeon or physician assistant may ask you to apply an ACE wrap or wear compression  (TED) stockings to help to reduce swelling.  Reduce your risk of vascular disease  Stop smoking. If you would like help call [facility] at 1-800-QUIT-NOW 281 712 5809) or [place] at 4127951671.  Manage your cholesterol Maintain a desired weight Control your diabetes weight Control your diabetes Keep your blood pressure down  If you have any questions, please call the office at [phone]   Prescriptions given: 1.  Roxicet #30 No Refill 2.  Lovenox 80mg  10 syringes one refill  Disposition: home  Patient's condition: is Good  Follow up: 1. Dr. Scot Dock in 2-3 weeks 2. Coumadin Clinic 05/10/17 @ [facility], PA-C [facility] [phone] 05/09/2017  9:54 AM  - For VQI Registry use ---   Post-op:  Wound infection: No  Graft infection: No  Transfusion: No    If yes, n/a units given New Arrhythmia: Yes went back into Afib Ipsilateral amputation: No, [ ]  Minor, [ ]  BKA, [ ]  AKA Discharge patency: [x ] Primary, [ ]  Primary assisted, [ ]  Secondary, [ ]  Occluded Patency judged by: [x ] Dopper only, [ ]  Palpable graft pulse, []  Palpable distal pulse, [ ]  ABI inc. > 0.15, [ ]  Duplex Discharge ABI: R , L Gleason D/C Ambulatory Status: Ambulatory  Complications: MI: No, [ ]  Troponin only, [ ]  EKG or Clinical CHF: No Resp failure:No, [ ]  Pneumonia, [ ]  Ventilator Chg in renal function: No, [ ]  Inc. Cr > 0.5, [ ]  Temp. Dialysis,  [ ]  Permanent dialysis Stroke: No, [ ]  Minor, [ ]  Major Return to OR: No  Reason for return to OR: [ ]  Bleeding, [ ]  Infection, [ ]  Thrombosis, [ ]  Revision  Discharge medications: Statin  use:  no ASA use:  no Plavix use:  no Beta blocker use: yes CCB use:  Yes ACEI use:   yes ARB use:  no Coumadin use: yes

## 2017-05-09 NOTE — Care Management Note (Addendum)
Case Management Note [name] RN, BSN Unit 4E-Case Manager [phone]  Patient Details  Name: Edward Marquez MRN: [id number] Date of Birth: 1951/07/24  Subjective/Objective:   Pt admitted s/p Left above-knee popliteal to anterior tibial artery bypass                 Action/Plan: PTA pt lived at home with wife- per PT eval no f/u recommendations made- anticipate return home- Pt was on Tikosyn prior to admission- CM to follow.   Expected Discharge Date:   05/09/17              Expected Discharge Plan:  Home/Self Care  In-House Referral:  NA  Discharge planning Services  CM Consult  Post Acute Care Choice:  DME Choice offered to:  patient  DME Arranged:   rolling walker DME Agency:   [facility]  HH Arranged:    [name]:     Status of Service:  Completed, signed off  If discussed at [place] of Stay Meetings, dates discussed:    Discharge Disposition: home/self care   Additional Comments:  05/09/17- [place] RN, CM- pt for d/c home today- PA has made arrangements for pt to have INR checked at coumadin clinic. No CM needs noted for discharge. 1400- update- informed by bedside RN that pt had decided that he wanted a RW for home- order has been placed- notified Brad with [facility] for DME need- RW to be delivered to room prior to discharge.   [name], RN 05/09/2017, 11:19 AM

## 2017-05-10 DIAGNOSIS — Z952 Presence of prosthetic heart valve: Secondary | ICD-10-CM | POA: Diagnosis not present

## 2017-05-10 DIAGNOSIS — I4891 Unspecified atrial fibrillation: Secondary | ICD-10-CM | POA: Diagnosis not present

## 2017-05-10 DIAGNOSIS — Z5181 Encounter for therapeutic drug level monitoring: Secondary | ICD-10-CM | POA: Diagnosis not present

## 2017-05-10 DIAGNOSIS — Z7901 Long term (current) use of anticoagulants: Secondary | ICD-10-CM | POA: Diagnosis not present

## 2017-05-13 ENCOUNTER — Telehealth: Payer: Self-pay | Admitting: *Deleted

## 2017-05-13 NOTE — Telephone Encounter (Signed)
Msg on voicemail: Patient  is having constant dull pain extreme swelling in his Left  leg S/P pop-bypass on 05/03/17. Denies any fever, drainage or change in circulation, which is equal  on both feet is equal. States he elevates leg on the coffee table. No change in pain when change in position.Full weight bearing on his left leg. Instructed patient to elevate leg above level of his heart and keep elevated as much as possible till swelling subsides.If no improvement to call us back.

## 2017-05-14 ENCOUNTER — Encounter: Payer: Medicare Other | Attending: Internal Medicine | Admitting: Internal Medicine

## 2017-05-14 DIAGNOSIS — I482 Chronic atrial fibrillation: Secondary | ICD-10-CM | POA: Diagnosis not present

## 2017-05-14 DIAGNOSIS — I739 Peripheral vascular disease, unspecified: Secondary | ICD-10-CM | POA: Insufficient documentation

## 2017-05-14 DIAGNOSIS — L97822 Non-pressure chronic ulcer of other part of left lower leg with fat layer exposed: Secondary | ICD-10-CM | POA: Insufficient documentation

## 2017-05-14 DIAGNOSIS — X58XXXA Exposure to other specified factors, initial encounter: Secondary | ICD-10-CM | POA: Insufficient documentation

## 2017-05-14 DIAGNOSIS — Z952 Presence of prosthetic heart valve: Secondary | ICD-10-CM | POA: Diagnosis not present

## 2017-05-14 DIAGNOSIS — I10 Essential (primary) hypertension: Secondary | ICD-10-CM | POA: Insufficient documentation

## 2017-05-14 DIAGNOSIS — M109 Gout, unspecified: Secondary | ICD-10-CM | POA: Diagnosis not present

## 2017-05-14 DIAGNOSIS — S81802A Unspecified open wound, left lower leg, initial encounter: Secondary | ICD-10-CM | POA: Diagnosis not present

## 2017-05-16 NOTE — Progress Notes (Signed)
Edward Marquez, Edward Marquez ([id number]) Visit Report for 05/14/2017 Arrival Information Details Patient Name: Edward Marquez, Edward Marquez Date of Service: 05/14/2017 8:45 AM Medical Record Number: [id number] Patient Account Number: [id number] Date of Birth/Sex: April 19, 1951 (66 y.o. Male) Treating RN: Ahmed Prima Primary Care Niels Cranshaw: PATIENT, NO Other Clinician: Referring Arlind Klingerman: Vic Blackbird Treating Shjon Lizarraga/Extender: Tito Dine in Treatment: 8 Visit Information History Since Last Visit All ordered tests and consults were completed: No Patient Arrived: Ambulatory Added or deleted any medications: No Arrival Time: 08:57 Any new allergies or adverse reactions: No Accompanied By: self Had a fall or experienced change in No Transfer Assistance: None activities of daily living that may affect Patient Identification Verified: Yes risk of falls: Secondary Verification Process Yes Signs or symptoms of abuse/neglect since last No Completed: visito Patient Requires Transmission- No Hospitalized since last visit: No Based Precautions: Has Dressing in Place as Prescribed: Yes Patient Has Alerts: Yes Pain Present Now: Yes Patient Alerts: Patient on Blood Thinner ABI [place] BILATERAL >220 Coumadin Electronic Signature(s) Signed: 05/14/2017 5:08:57 PM By: Alric Quan Entered By: Alric Quan on 05/14/2017 09:01:00 Edward Marquez ([id number]) -------------------------------------------------------------------------------- Clinic Level of Care Assessment Details Patient Name: Edward Marquez Date of Service: 05/14/2017 8:45 AM Medical Record Number: [id number] Patient Account Number: [id number] Date of Birth/Sex: Jan 02, 1951 (66 y.o. Male) Treating RN: Carolyne Fiscal, Debi Primary Care Jalee Saine: PATIENT, NO Other Clinician: Referring Emiliano Welshans: Vic Blackbird Treating Cassey Hurrell/Extender: Tito Dine in Treatment: 8 Clinic Level of Care Assessment Items TOOL 4 Quantity Score X -  Use when only an EandM is performed on FOLLOW-UP visit 1 0 ASSESSMENTS - Nursing Assessment / Reassessment X - Reassessment of Co-morbidities (includes updates in patient status) 1 10 X - Reassessment of Adherence to Treatment Plan 1 5 ASSESSMENTS - Wound and Skin Assessment / Reassessment X - Simple Wound Assessment / Reassessment - one wound 1 5 []  - Complex Wound Assessment / Reassessment - multiple wounds 0 []  - Dermatologic / Skin Assessment (not related to wound area) 0 ASSESSMENTS - Focused Assessment []  - Circumferential Edema Measurements - multi extremities 0 []  - Nutritional Assessment / Counseling / Intervention 0 []  - Lower Extremity Assessment (monofilament, tuning fork, pulses) 0 []  - Peripheral Arterial Disease Assessment (using hand held doppler) 0 ASSESSMENTS - Ostomy and/or Continence Assessment and Care []  - Incontinence Assessment and Management 0 []  - Ostomy Care Assessment and Management (repouching, etc.) 0 PROCESS - Coordination of Care X - Simple Patient / Family Education for ongoing care 1 15 []  - Complex (extensive) Patient / Family Education for ongoing care 0 []  - Staff obtains Programmer, systems, Records, Test Results / Process Orders 0 []  - Staff telephones HHA, Nursing Homes / Clarify orders / etc 0 []  - Routine Transfer to another Facility (non-emergent condition) 0 [place], Edward Marquez ([id number]) []  - Routine Hospital Admission (non-emergent condition) 0 []  - New Admissions / Biomedical engineer / Ordering NPWT, Apligraf, etc. 0 []  - Emergency Hospital Admission (emergent condition) 0 X - Simple Discharge Coordination 1 10 []  - Complex (extensive) Discharge Coordination 0 PROCESS - Special Needs []  - Pediatric / Minor Patient Management 0 []  - Isolation Patient Management 0 []  - Hearing / Language / Visual special needs 0 []  - Assessment of Community assistance (transportation, D/C planning, etc.) 0 []  - Additional assistance / Altered mentation 0 []  -  Support Surface(s) Assessment (bed, cushion, seat, etc.) 0 INTERVENTIONS - Wound Cleansing / Measurement X - Simple Wound Cleansing - one wound 1 5 []  - Complex Wound Cleansing -  multiple wounds 0 X - Wound Imaging (photographs - any number of wounds) 1 5 []  - Wound Tracing (instead of photographs) 0 X - Simple Wound Measurement - one wound 1 5 []  - Complex Wound Measurement - multiple wounds 0 INTERVENTIONS - Wound Dressings X - Small Wound Dressing one or multiple wounds 1 10 []  - Medium Wound Dressing one or multiple wounds 0 []  - Large Wound Dressing one or multiple wounds 0 X - Application of Medications - topical 1 5 []  - Application of Medications - injection 0 INTERVENTIONS - Miscellaneous []  - External ear exam 0 Edward Marquez, Edward Marquez ([id number]) []  - Specimen Collection (cultures, biopsies, blood, body fluids, etc.) 0 []  - Specimen(s) / Culture(s) sent or taken to Lab for analysis 0 []  - Patient Transfer (multiple staff / Harrel Lemon Lift / Similar devices) 0 []  - Simple Staple / Suture removal (25 or less) 0 []  - Complex Staple / Suture removal (26 or more) 0 []  - Hypo / Hyperglycemic Management (close monitor of Blood Glucose) 0 []  - Ankle / Brachial Index (ABI) - do not check if billed separately 0 X - Vital Signs 1 5 Has the patient been seen at the hospital within the last three years: Yes Total Score: 80 Level Of Care: New/Established - Level 3 Electronic Signature(s) Signed: 05/14/2017 5:08:57 PM By: Alric Quan Entered By: Alric Quan on 05/14/2017 09:55:36 Edward Marquez ([id number]) -------------------------------------------------------------------------------- Encounter Discharge Information Details Patient Name: Edward Marquez Date of Service: 05/14/2017 8:45 AM Medical Record Number: [id number] Patient Account Number: [id number] Date of Birth/Sex: November 28, 1950 (66 y.o. Male) Treating RN: Ahmed Prima Primary Care Flynn Lininger: PATIENT, NO Other  Clinician: Referring Roan Miklos: Vic Blackbird Treating Megan Hayduk/Extender: Tito Dine in Treatment: 8 Encounter Discharge Information Items Discharge Pain Level: 0 Discharge Condition: Stable Ambulatory Status: Ambulatory Discharge Destination: Home Private Transportation: Auto Accompanied By: self Schedule Follow-up Appointment: Yes Medication Reconciliation completed and No provided to Patient/Care Keishia Ground: Clinical Summary of Care: Electronic Signature(s) Signed: 05/14/2017 5:08:57 PM By: Alric Quan Entered By: Alric Quan on 05/14/2017 09:22:36 Edward Marquez ([id number]) -------------------------------------------------------------------------------- Lower Extremity Assessment Details Patient Name: Edward Marquez Date of Service: 05/14/2017 8:45 AM Medical Record Number: [id number] Patient Account Number: [id number] Date of Birth/Sex: 23-Jan-1951 (66 y.o. Male) Treating RN: Ahmed Prima Primary Care Zanyah Lentsch: PATIENT, NO Other Clinician: Referring Shauntea Lok: Vic Blackbird Treating Donathan Buller/Extender: Ricard Dillon Weeks in Treatment: 8 Vascular Assessment Pulses: Dorsalis Pedis Palpable: [Left:Yes] Posterior Tibial Extremity colors, hair growth, and conditions: Extremity Color: [Left:Hyperpigmented] Temperature of Extremity: [Left:Warm] Capillary Refill: [Left:< 3 seconds] Toe Nail Assessment Left: Right: Thick: No Discolored: No Deformed: No Improper Length and Hygiene: No Electronic Signature(s) Signed: 05/14/2017 5:08:57 PM By: Alric Quan Entered By: Alric Quan on 05/14/2017 09:04:08 Edward Marquez ([id number]) -------------------------------------------------------------------------------- Multi Wound Chart Details Patient Name: Edward Marquez Date of Service: 05/14/2017 8:45 AM Medical Record Number: [id number] Patient Account Number: [id number] Date of Birth/Sex: 05-15-1951 (66 y.o. Male) Treating RN: Ahmed Prima Primary Care Jeshawn Melucci: PATIENT, NO Other Clinician: Referring Kordae Buonocore: Vic Blackbird Treating Nikisha Fleece/Extender: Ricard Dillon Weeks in Treatment: 8 Vital Signs Height(in): 70 Pulse(bpm): 92 Weight(lbs): 180 Blood Pressure 158/77 (mmHg): Body Mass Index(BMI): 26 Temperature(F): 97.6 Respiratory Rate 18 (breaths/min): Photos: [1:No Photos] [N/A:N/A] Wound Location: [1:Left Lower Leg - Anterior N/A] Wounding Event: [1:Trauma] [N/A:N/A] Primary Etiology: [1:Trauma, Other] [N/A:N/A] Comorbid History: [1:Arrhythmia, Hypertension, N/A Gout] Date Acquired: [1:02/15/2017] [N/A:N/A] Weeks of Treatment: [1:8] [N/A:N/A] Wound Status: [1:Open] [N/A:N/A] Measurements L x W x D 1.4x0.4x0.2 [N/A:N/A] (cm) Area (cm) : [  1:0.44] [N/A:N/A] Volume (cm) : [1:0.088] [N/A:N/A] % Reduction in Area: [1:25.30%] [N/A:N/A] % Reduction in Volume: 25.40% [N/A:N/A] Classification: [1:Full Thickness With Exposed Support Structures] [N/A:N/A] Exudate Amount: [1:Large] [N/A:N/A] Exudate Type: [1:Serosanguineous] [N/A:N/A] Exudate Color: [1:red, brown] [N/A:N/A] Wound Margin: [1:Epibole] [N/A:N/A] Granulation Amount: [1:Large (67-100%)] [N/A:N/A] Granulation Quality: [1:Red] [N/A:N/A] Necrotic Amount: [1:Small (1-33%)] [N/A:N/A] Exposed Structures: [1:Fat Layer (Subcutaneous N/A Tissue) Exposed: Yes Fascia: No Tendon: No Muscle: No] Joint: No Bone: No Epithelialization: None N/A N/A Periwound Skin Texture: Excoriation: No N/A N/A Induration: No Callus: No Crepitus: No Rash: No Scarring: No Periwound Skin Maceration: No N/A N/A Moisture: Dry/Scaly: No Periwound Skin Color: Erythema: Yes N/A N/A Atrophie Blanche: No Cyanosis: No Ecchymosis: No Hemosiderin Staining: No Mottled: No Pallor: No Rubor: No Erythema Location: Circumferential N/A N/A Temperature: No Abnormality N/A N/A Tenderness on Yes N/A N/A Palpation: Wound Preparation: Ulcer Cleansing: N/A  N/A Rinsed/Irrigated with Saline Topical Anesthetic Applied: Other: lidocaine 4% Treatment Notes Wound #1 (Left, Anterior Lower Leg) 1. Cleansed with: Clean wound with Normal Saline 2. Anesthetic Topical Lidocaine 4% cream to wound bed prior to debridement 4. Dressing Applied: Promogran 5. Secondary Dressing Applied Dry Edward Marquez Signature(s) Signed: 05/15/2017 8:40:51 AM By: Linton Ham MD Entered By: Linton Ham on 05/14/2017 09:44:44 Edward Marquez, Edward Marquez ([id number]) Edward Marquez, Edward Marquez ([id number]) -------------------------------------------------------------------------------- Multi-Disciplinary Care Plan Details Patient Name: Edward Marquez Date of Service: 05/14/2017 8:45 AM Medical Record Number: [id number] Patient Account Number: [id number] Date of Birth/Sex: [birth year] (66 y.o. Male) Treating RN: Ahmed Prima Primary Care Hasel Janish: PATIENT, NO Other Clinician: Referring Angeles Paolucci: Vic Blackbird Treating Anayeli Arel/Extender: Tito Dine in Treatment: 8 Active Inactive ` Orientation to the Wound Care Program Nursing Diagnoses: Knowledge deficit related to the wound healing center program Goals: Patient/caregiver will verbalize understanding of the [facility] Program Date Initiated: 03/18/2017 Target Resolution Date: 05/24/2017 Goal Status: Active Interventions: Provide education on orientation to the wound center Notes: ` Wound/Skin Impairment Nursing Diagnoses: Impaired tissue integrity Goals: Ulcer/skin breakdown will have a volume reduction of 30% by week 4 Date Initiated: 03/18/2017 Target Resolution Date: 05/24/2017 Goal Status: Active Ulcer/skin breakdown will have a volume reduction of 50% by week 8 Date Initiated: 03/18/2017 Target Resolution Date: 05/24/2017 Goal Status: Active Ulcer/skin breakdown will have a volume reduction of 80% by week 12 Date Initiated: 03/18/2017 Target Resolution Date: 05/24/2017 Goal  Status: Active Ulcer/skin breakdown will heal within 14 weeks Date Initiated: 03/18/2017 Target Resolution Date: 05/24/2017 Goal Status: Active Interventions: Edward Marquez, Edward Marquez ([id number]) Assess patient/caregiver ability to obtain necessary supplies Assess patient/caregiver ability to perform ulcer/skin care regimen upon admission and as needed Assess ulceration(s) every visit Notes: Electronic Signature(s) Signed: 05/14/2017 5:08:57 PM By: Alric Quan Entered By: Alric Quan on 05/14/2017 09:04:26 Edward Marquez ([id number]) -------------------------------------------------------------------------------- Pain Assessment Details Patient Name: Edward Marquez Date of Service: 05/14/2017 8:45 AM Medical Record Number: [id number] Patient Account Number: [id number] Date of Birth/Sex: 01/21/1951 (66 y.o. Male) Treating RN: Ahmed Prima Primary Care Kieren Adkison: PATIENT, NO Other Clinician: Referring Milus Fritze: Vic Blackbird Treating Braxson Hollingsworth/Extender: Ricard Dillon Weeks in Treatment: 8 Active Problems Location of Pain Severity and Description of Pain Patient Has Paino Yes Site Locations Rate the pain. Current Pain Level: 5 Character of Pain Describe the Pain: Throbbing Pain Management and Medication Current Pain Management: Electronic Signature(s) Signed: 05/14/2017 5:08:57 PM By: Alric Quan Entered By: Alric Quan on 05/14/2017 09:01:22 Edward Marquez ([id number]) -------------------------------------------------------------------------------- Patient/Caregiver Education Details Patient Name: Edward Marquez Date of Service: 05/14/2017 8:45 AM Medical Record Number: [id number] Patient Account  Number: [id number] Date of Birth/Gender: 03/08/1951 (66 y.o. Male) Treating RN: Carolyne Fiscal, Debi Primary Care Physician: PATIENT, NO Other Clinician: Referring Physician: Vic Blackbird Treating Physician/Extender: Tito Dine in Treatment: 8 Education  Assessment Education Provided To: Patient Education Topics Provided Wound/Skin Impairment: Handouts: Other: change dressing as ordered Methods: Demonstration, Explain/Verbal Responses: State content correctly Electronic Signature(s) Signed: 05/14/2017 5:08:57 PM By: Alric Quan Entered By: Alric Quan on 05/14/2017 09:22:53 Edward Marquez ([id number]) -------------------------------------------------------------------------------- Wound Assessment Details Patient Name: Edward Marquez Date of Service: 05/14/2017 8:45 AM Medical Record Number: [id number] Patient Account Number: [id number] Date of Birth/Sex: 12-May-1951 (66 y.o. Male) Treating RN: Carolyne Fiscal, Debi Primary Care Ishanvi Mcquitty: PATIENT, NO Other Clinician: Referring Petronella Shuford: Vic Blackbird Treating Amaris Delafuente/Extender: Ricard Dillon Weeks in Treatment: 8 Wound Status Wound Number: 1 Primary Etiology: Trauma, Other Wound Location: Left Lower Leg - Anterior Wound Status: Open Wounding Event: Trauma Comorbid History: Arrhythmia, Hypertension, Gout Date Acquired: 02/15/2017 Weeks Of Treatment: 8 Clustered Wound: No Photos Photo Uploaded By: Alric Quan on 05/14/2017 17:04:37 Wound Measurements Length: (cm) 1.4 Width: (cm) 0.4 Depth: (cm) 0.2 Area: (cm) 0.44 Volume: (cm) 0.088 % Reduction in Area: 25.3% % Reduction in Volume: 25.4% Epithelialization: None Tunneling: No Undermining: No Wound Description Full Thickness With Exposed Classification: Support Structures Wound Margin: Epibole Exudate Large Amount: Exudate Type: Serosanguineous Exudate Color: red, brown Foul Odor After Cleansing: No Slough/Fibrino Yes Wound Bed Granulation Amount: Large (67-100%) Exposed Structure Granulation Quality: Red Fascia Exposed: No Necrotic Amount: Small (1-33%) Fat Layer (Subcutaneous Tissue) Exposed: Yes Edward Marquez, Edward Marquez ([id number]) Necrotic Quality: Adherent Slough Tendon Exposed: No Muscle Exposed:  No Joint Exposed: No Bone Exposed: No Periwound Skin Texture Texture Color No Abnormalities Noted: No No Abnormalities Noted: No Callus: No Atrophie Blanche: No Crepitus: No Cyanosis: No Excoriation: No Ecchymosis: No Induration: No Erythema: Yes Rash: No Erythema Location: Circumferential Scarring: No Hemosiderin Staining: No Mottled: No Moisture Pallor: No No Abnormalities Noted: No Rubor: No Dry / Scaly: No Maceration: No Temperature / Pain Temperature: No Abnormality Tenderness on Palpation: Yes Wound Preparation Ulcer Cleansing: Rinsed/Irrigated with Saline Topical Anesthetic Applied: Other: lidocaine 4%, Treatment Notes Wound #1 (Left, Anterior Lower Leg) 1. Cleansed with: Clean wound with Normal Saline 2. Anesthetic Topical Lidocaine 4% cream to wound bed prior to debridement 4. Dressing Applied: Promogran 5. Secondary Dressing Applied Dry [place] Signature(s) Signed: 05/14/2017 5:08:57 PM By: Alric Quan Entered By: Alric Quan on 05/14/2017 09:02:32 Edward Marquez ([id number]) -------------------------------------------------------------------------------- [place] Details Patient Name: Edward Marquez Date of Service: 05/14/2017 8:45 AM Medical Record Number: [id number] Patient Account Number: [id number] Date of Birth/Sex: 07/27/1951 (66 y.o. Male) Treating RN: Carolyne Fiscal, Debi Primary Care Neosha Switalski: PATIENT, NO Other Clinician: Referring Ziyah Cordoba: Vic Blackbird Treating Khamarion Bjelland/Extender: Tito Dine in Treatment: 8 Vital Signs Time Taken: 09:01 Temperature (F): 97.6 Height (in): 70 Pulse (bpm): 92 Weight (lbs): 180 Respiratory Rate (breaths/min): 18 Body Mass Index (BMI): 25.8 Blood Pressure (mmHg): 158/77 Reference Range: 80 - 120 mg / dl Electronic Signature(s) Signed: 05/14/2017 5:08:57 PM By: Alric Quan Entered By: Alric Quan on 05/14/2017 09:01:45

## 2017-05-16 NOTE — Progress Notes (Signed)
Edward Marquez, Edward Marquez ([id number]) Visit Report for 05/14/2017 HPI Details Patient Name: Edward Marquez, Edward Marquez Date of Service: 05/14/2017 8:45 AM Medical Record Number: [id number] Patient Account Number: [id number] Date of Birth/Sex: 03-05-1951 (66 y.o. Male) Treating RN: Ahmed Prima Primary Care Provider: PATIENT, NO Other Clinician: Referring Provider: Vic Blackbird Treating Provider/Extender: Ricard Dillon Weeks in Treatment: 8 History of Present Illness HPI Description: 03/18/17 patient presents today for initial evaluation concerning an injury which occurred roughly 2 months ago when he was using a tiller at his home. He tells me that he was carrying this when it cut his left anterior shin. He did not go see anyone immediately as he felt that it would just heal on its own but obviously he tells me that's not the case. This ended up red and inflamed surrounding and he was diagnosed with cellulitis at the urgent care when he was seen on 02/19/17. He was started on Augmentin at that time and then had a follow-up wound care check on 03/11/17 at urgent care as well. Fortunately there is no evidence of infection at that point the Bactroban was prescribed for him at that time. He has been using that sense. Patient is unaware of the fact that he had issues with blood flow in his lower extremities bilaterally until all this happened and they began evaluating him at urgent care. Subsequently he has been scheduled for an arterial study with ABI and TBI on 03/15/17 and this will be scheduled for 04/10/17. 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 He states that he has discomfort about one out of 10 at rest described as a sharp/burning sensation that is worsened to four out of 10 with cleansing the wound. Other  than those medical conditions patient appears to be in good health and remains physically active even at 66 years of age. 03/26/17 on evaluation today patient appears to be doing fairly well in regard to his left lower extremity wound. The overall appearance is greatly improved although his measurements really have not shifted at all. He does have an appointment for a vascular evaluation due to his diminished blood flow in the bilateral lower extremities with associated history of potential vascular claudication. This appointment is scheduled for April 10, 2017 in [place]. Otherwise patient has no significant pain and states that he asked he feels like his "neuropathy" is doing better. 04/02/17; traumatic wound on his left anterior leg about 2-1/2 months ago which has not really made any substantial progression towards healing. The patient has been using Santyl. He has had a venous ultrasound that was negative for DVT. He has arterial studies and apparently a vascular surgeon consultation booked for 04/10/17. The patient has a St. Jude's aortic valve and is on Coumadin. He complains of discomfort predominantly in the plantar aspect of his left foot and lateral 4 toes. This is worse at night and may actually reflect plantar fasciitis. But with ongoing questioning I'm quite convinced that he has claudication approximately 100 yards when walking to his barn from his house aching discomfort in both legs 04/09/17; vascular studies and apparently vascular consultation both booked for tomorrow. He continues to complain of a burning sensation in his left leg which I think may be claudication. We have been using Santyl I think since the beginning  of his stay here. He will be traveling on vacation next week we will see him back in 2 weeks SHYKEEM, RESURRECCION ([id number]) 04/23/17; Edward Marquez saw Dr. Doren Custard of vascular surgery. His arterial studies showed a right ABI of 1.01. Noncompressible on  the left side. There was no flow on the left great toe and sick TBI could not be obtained. TBI in the right is quoted at 0.09. He is apparently going for a arteriogram on 05/01/17. He has been using Santyl to the wound. He is using Santyl to the wound 05/14/17; since the patient was last in clinic he was admitted to hospital from 9/18 through 9/27. He underwent angiography and ultimately on 9/21 had a left above-knee popliteal to anterior tibial artery bypass with a saphenous vein graft. He is on chronic Coumadin secondary to periodic follow-up replacement therefore needed bridging with Lovenox and then return to Coumadin. He is doing well from a wound healing point of view but he still complains of edema and a discomfort in his foot at night when he elevates the leg. 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 I'm not clear exactly what he is using on the original left leg wound. As mentioned he is complaining of pain or some form of discomfort at night when his leg is elevated and having difficulty sleeping. He is not using the Percocet that was prescribed when he left the hospital Electronic Signature(s) Signed: 05/15/2017 8:40:51 AM By: Linton Ham MD Entered By: Linton Ham on 05/14/2017 09:47:35 Edward Marquez ([id number]) -------------------------------------------------------------------------------- Physical Exam Details Patient Name: Edward Marquez Date of Service: 05/14/2017 8:45 AM Medical Record Number: [id number] Patient Account Number: [id number] Date of Birth/Sex: 01-04-1951 (66 y.o. Male) Treating RN: Ahmed Prima Primary Care Provider: PATIENT, NO Other Clinician: Referring Provider: Vic Blackbird Treating Provider/Extender: Ricard Dillon Weeks in Treatment: 8 Constitutional Patient is hypertensive.. Pulse regular and within target range for patient.Marland Kitchen  Respirations regular, non-labored and within target range.. Temperature is normal and within the target range for the patient.Marland Kitchen appears in no distress. Eyes Conjunctivae clear. No discharge. Respiratory Respiratory effort is easy and symmetric bilaterally. Rate is normal at rest and on room air.. Cardiovascular He has a faint left dorsalis pedis pulse in his foot is warm both of these improvements from his preoperative state. Risks some edema on the dorsal aspect of his left foot but I see no evidence of infection or a DVT. Lymphatic And palpable in the popliteal or inguinal area. Neurological Patient has good sensation to the microfilament in the left foot and his strength in the left foot and ankle is normal. Psychiatric No evidence of depression, anxiety, or agitation. Calm, cooperative, and communicative. Appropriate interactions and affect.. Notes Wound exam; the patient's oval-shaped wound on the anterior calf which was original site of the wound that we were treating him for his smaller with a healthy-looking base. All of the patient's extensive medial fire and medial calf surgical sites also appear to be well apposed and on their way to healing. Electronic Signature(s) Signed: 05/15/2017 8:40:51 AM By: Linton Ham MD Entered By: Linton Ham on 05/14/2017 09:49:46 Edward Marquez ([id number]) -------------------------------------------------------------------------------- Physician Orders Details Patient Name: Edward Marquez Date of Service: 05/14/2017 8:45 AM Medical Record Number: [id number] Patient Account Number: [id number] Date of Birth/Sex: 1951/06/30 (66 y.o. Male) Treating RN: Ahmed Prima Primary Care Provider: PATIENT, NO Other Clinician: Referring Provider: Vic Blackbird Treating Provider/Extender: Dellia Nims  Dashea Mcmullan G Weeks in Treatment: 8 Verbal / Phone Orders: Yes Clinician: Pinkerton, Debi Read Back and Verified: Yes Diagnosis Coding Wound  Cleansing Wound #1 Left,Anterior Lower Leg o Clean wound with Normal Saline. Anesthetic Wound #1 Left,Anterior Lower Leg o Topical Lidocaine 4% cream applied to wound bed prior to debridement Skin Barriers/Peri-Wound Care Wound #1 Left,Anterior Lower Leg o Skin Prep Primary Wound Dressing Wound #1 Left,Anterior Lower Leg o Promogran Secondary Dressing Wound #1 Left,Anterior Lower Leg o Dry Gauze o Non-adherent pad - telfa island Dressing Change Frequency Wound #1 Left,Anterior Lower Leg o Change dressing every other day. Follow-up Appointments Wound #1 Left,Anterior Lower Leg o Return Appointment in 1 week. Edema Control Wound #1 Left,Anterior Lower Leg o Elevate legs to the level of the heart and pump ankles as often as possible Additional Orders / Instructions Edward Marquez, Edward Marquez ([id number]) Wound #1 Left,Anterior Lower Leg o Increase protein intake. o Other: - Please add vitamin A, vitamin C and zinc supplements to your diet Electronic Signature(s) Signed: 05/14/2017 5:08:57 PM By: Alric Quan Signed: 05/15/2017 8:40:51 AM By: Linton Ham MD Entered By: Alric Quan on 05/14/2017 09:21:31 Edward Marquez ([id number]) -------------------------------------------------------------------------------- Problem List Details Patient Name: Edward Marquez Date of Service: 05/14/2017 8:45 AM Medical Record Number: [id number] Patient Account Number: [id number] Date of Birth/Sex: 1951/04/21 (66 y.o. 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 Male) Treating RN: Ahmed Prima Primary Care Provider: PATIENT, NO Other Clinician: Referring Provider: Vic Blackbird Treating Provider/Extender: Ricard Dillon Weeks in Treatment: 8 Subjective History of Present Illness (HPI) 03/18/17 patient presents today for initial evaluation concerning an injury which occurred roughly 2 months ago when he was using a tiller at his home. He tells me that he was carrying this when it cut his left anterior shin. He did not go see anyone immediately as he felt that it would just heal on its own but obviously he tells me that's not the case. This ended up red and inflamed surrounding and he was diagnosed with cellulitis at the urgent care when he was seen on 02/19/17. He was started on Augmentin at that time and then had a follow-up wound care check on 03/11/17 at urgent care as well. Fortunately there is no evidence of infection at that point the Bactroban was prescribed for him at that time. He has been using that sense. Patient is unaware of the fact that he had issues with blood flow in his lower extremities bilaterally until all this happened and they began evaluating him at urgent care. Subsequently he has been scheduled for an arterial study with ABI and TBI on 03/15/17 and this will be scheduled for 04/10/17. 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 He states that he has discomfort about one out of 10 at rest described as a sharp/burning sensation that is worsened to four out of 10 with cleansing the wound. Other than those medical conditions patient appears to be in good health and remains physically active even at 66 years of age. 03/26/17 on evaluation today patient appears to be doing fairly well in regard to his left lower extremity wound. The overall appearance is greatly improved although his measurements really have not shifted at all. He does have an appointment for a vascular evaluation due to his diminished blood flow in the bilateral lower extremities with associated history of potential vascular claudication. This appointment is scheduled for April 10, 2017 in [place]. Otherwise patient has no significant pain and states that he asked he feels like his "neuropathy" is doing better. 04/02/17; traumatic wound on his left anterior leg about 2-1/2 months ago which has not really made any substantial progression towards healing. The patient has been using Santyl. He has had a venous ultrasound that was negative for DVT. He has arterial studies and apparently a vascular surgeon consultation booked for 04/10/17. The patient has a St. Jude's aortic valve and is on Coumadin. He complains of discomfort predominantly in the plantar aspect of his left foot and lateral 4 toes. This is worse at night and may actually reflect plantar fasciitis. But with ongoing questioning I'm quite convinced that he has claudication approximately 100 yards when walking to his barn from his house aching discomfort in both legs 04/09/17; vascular studies and apparently vascular consultation both booked for tomorrow. He continues  to complain of a burning sensation in his left leg which I think may be claudication. We have been using Santyl I think since the beginning of his stay here. He will be traveling on vacation next week we will see him back in 2 weeks 04/23/17; Mr. Gearin saw Dr. Doren Custard of vascular surgery. His arterial studies showed a right ABI of 1.01. Noncompressible on the left side. There was no flow on the left great toe and sick TBI could not be obtained. TBI in the right is quoted at 0.09. He is apparently going for a arteriogram on 05/01/17. He has [place], Edward Marquez ([id number]) been using Santyl to the wound. He is using Santyl to the wound 05/14/17; since the patient was last in clinic he was admitted to hospital from 9/18 through 9/27. He underwent angiography and ultimately on 9/21 had a left above-knee popliteal to anterior tibial artery bypass with a saphenous vein graft. He is on chronic Coumadin secondary to periodic follow-up replacement therefore needed bridging with Lovenox and then return to Coumadin. He is doing well from a wound healing point of view but he still complains of edema and a discomfort in his foot at night when he elevates the leg. 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 I'm not clear exactly what he is using on the original left leg wound. As mentioned he is complaining of pain or some form of discomfort at night when his leg is elevated and having difficulty sleeping. He is not using the Percocet that was prescribed when he left the hospital Objective Constitutional Patient is hypertensive.. Pulse regular and within target range for patient.Marland Kitchen  Respirations regular, non-labored and within target range.. Temperature is normal and within the target range for the patient.Marland Kitchen appears in no distress. Vitals Time Taken: 9:01 AM, Height: 70 in, Weight: 180 lbs, BMI: 25.8,  Temperature: 97.6 F, Pulse: 92 bpm, Respiratory Rate: 18 breaths/min, Blood Pressure: 158/77 mmHg. Eyes Conjunctivae clear. No discharge. Respiratory Respiratory effort is easy and symmetric bilaterally. Rate is normal at rest and on room air.. Cardiovascular He has a faint left dorsalis pedis pulse in his foot is warm both of these improvements from his preoperative state. Risks some edema on the dorsal aspect of his left foot but I see no evidence of infection or a DVT. Lymphatic And palpable in the popliteal or inguinal area. Neurological Patient has good sensation to the microfilament in the left foot and his strength in the left foot and ankle is normal. Psychiatric Edward Marquez, Edward Marquez ([id number]) No evidence of depression, anxiety, or agitation. Calm, cooperative, and communicative. Appropriate interactions and affect.. General Notes: Wound exam; the patient's oval-shaped wound on the anterior calf which was original site of the wound that we were treating him for his smaller with a healthy-looking base. All of the patient's extensive medial fire and medial calf surgical sites also appear to be well apposed and on their way to healing. Integumentary (Hair, Skin) Wound #1 status is Open. Original cause of wound was Trauma. The wound is located on the Left,Anterior Lower Leg. The wound measures 1.4cm length x 0.4cm width x 0.2cm depth; 0.44cm^2 area and 0.088cm^3 volume. There is Fat Layer (Subcutaneous Tissue) Exposed exposed. There is no tunneling or undermining noted. There is a large amount of serosanguineous drainage noted. The wound margin is epibole. There is large (67-100%) red granulation within the wound bed. There is a small (1-33%) amount of necrotic tissue within the wound bed including Adherent Slough. The periwound skin appearance exhibited: Erythema. The periwound skin appearance did not exhibit: Callus, Crepitus, Excoriation, Induration, Rash, Scarring, Dry/Scaly,  Maceration, Atrophie Blanche, Cyanosis, Ecchymosis, Hemosiderin Staining, Mottled, Pallor, Rubor. The surrounding wound skin color is noted with erythema which is circumferential. Periwound temperature was noted as No Abnormality. The periwound has tenderness on palpation. 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 Anesthetic: Wound #1 Left,Anterior Lower Leg: Topical Lidocaine 4% cream applied to wound bed prior to debridement Skin Barriers/Peri-Wound Care: Wound #1 Left,Anterior Lower Leg: DAILEN, MCCLISH ([id number]) Skin Prep Primary Wound Dressing: Wound #1 Left,Anterior Lower Leg: Promogran Secondary Dressing: Wound #1 Left,Anterior Lower Leg: Dry Gauze Non-adherent pad - telfa island Dressing Change Frequency: Wound #1 Left,Anterior Lower Leg: Change dressing every other day. Follow-up Appointments: Wound #1 Left,Anterior Lower Leg: Return Appointment in 1 week. Edema Control: Wound #1 Left,Anterior Lower Leg: Elevate legs to the level of the heart and pump ankles as often as possible Additional Orders / Instructions: Wound #1 Left,Anterior Lower Leg: Increase protein intake. Other: - Please add vitamin A, vitamin C and zinc supplements to your diet #1 sober collagen border foam to his original wound on the left leg #2 I think she is still describing some claudication elements when he raises his leg at night #3 all of his extensive surgical scars appear normal and neurologic testing  in the left foot is normal #4 I advised melatonin for sleep or perhaps half of one of the Percocets #5 the patient is seeing  his primary physician next week. 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 According to him he was not discharged on aspirin or Plavix probably because he is on Coumadin. He is not aware of his cholesterol status Electronic Signature(s) Signed: 05/15/2017 8:40:51 AM By: Linton Ham MD Entered By: Linton Ham on 05/14/2017 09:51:14 Edward Marquez ([id number]) -------------------------------------------------------------------------------- [place] Details Patient Name: Edward Marquez Date of Service: 05/14/2017 Medical Record Number: [id number] Patient Account Number: [id number] Date of Birth/Sex: [birth year] (66 y.o. Male) Treating RN: Ahmed Prima Primary Care Provider: PATIENT, NO Other Clinician: Referring Provider: Vic Blackbird Treating Provider/Extender: Ricard Dillon Weeks in Treatment: 8 Diagnosis Coding ICD-10 Codes Code Description S81.802A Unspecified open wound, left lower leg, initial encounter I73.9 Peripheral vascular disease, unspecified L97.822 Non-pressure chronic ulcer of other part of left lower leg with fat layer exposed I48.2 Chronic atrial fibrillation Z95.2 Presence of prosthetic heart valve Facility Procedures CPT4 Code: [id number] Description: 99213 - WOUND CARE VISIT-LEV 3 EST PT Modifier: Quantity: 1 Physician Procedures CPT4: Description Modifier Quantity Code [id number] 99214 - WC PHYS LEVEL 4 - EST PT 1 ICD-10 Description Diagnosis I73.9 Peripheral vascular disease, unspecified L97.822 Non-pressure chronic ulcer of other part of left lower leg with fat layer exposed Electronic Signature(s) Signed: 05/14/2017 5:08:57 PM By: Alric Quan Signed: 05/15/2017 8:40:51 AM By: Linton Ham MD Entered By: Alric Quan on 05/14/2017 09:55:43

## 2017-05-17 DIAGNOSIS — Z7901 Long term (current) use of anticoagulants: Secondary | ICD-10-CM | POA: Diagnosis not present

## 2017-05-17 DIAGNOSIS — R791 Abnormal coagulation profile: Secondary | ICD-10-CM | POA: Diagnosis not present

## 2017-05-17 DIAGNOSIS — I4891 Unspecified atrial fibrillation: Secondary | ICD-10-CM | POA: Diagnosis not present

## 2017-05-17 DIAGNOSIS — Z5181 Encounter for therapeutic drug level monitoring: Secondary | ICD-10-CM | POA: Diagnosis not present

## 2017-05-17 DIAGNOSIS — Z952 Presence of prosthetic heart valve: Secondary | ICD-10-CM | POA: Diagnosis not present

## 2017-05-21 ENCOUNTER — Ambulatory Visit: Payer: Medicare Other | Admitting: Internal Medicine

## 2017-05-22 ENCOUNTER — Encounter: Payer: Medicare Other | Admitting: Internal Medicine

## 2017-05-22 DIAGNOSIS — I739 Peripheral vascular disease, unspecified: Secondary | ICD-10-CM | POA: Diagnosis not present

## 2017-05-22 DIAGNOSIS — I10 Essential (primary) hypertension: Secondary | ICD-10-CM | POA: Diagnosis not present

## 2017-05-22 DIAGNOSIS — S81802A Unspecified open wound, left lower leg, initial encounter: Secondary | ICD-10-CM | POA: Diagnosis not present

## 2017-05-22 DIAGNOSIS — L97822 Non-pressure chronic ulcer of other part of left lower leg with fat layer exposed: Secondary | ICD-10-CM | POA: Diagnosis not present

## 2017-05-22 DIAGNOSIS — Z952 Presence of prosthetic heart valve: Secondary | ICD-10-CM | POA: Diagnosis not present

## 2017-05-22 DIAGNOSIS — L03116 Cellulitis of left lower limb: Secondary | ICD-10-CM | POA: Diagnosis not present

## 2017-05-22 DIAGNOSIS — S91104A Unspecified open wound of right lesser toe(s) without damage to nail, initial encounter: Secondary | ICD-10-CM | POA: Diagnosis not present

## 2017-05-22 DIAGNOSIS — I482 Chronic atrial fibrillation: Secondary | ICD-10-CM | POA: Diagnosis not present

## 2017-05-22 DIAGNOSIS — M109 Gout, unspecified: Secondary | ICD-10-CM | POA: Diagnosis not present

## 2017-05-23 ENCOUNTER — Telehealth: Payer: Self-pay

## 2017-05-23 NOTE — Progress Notes (Addendum)
DEMAREE, LIBERTO ([id number]) Visit Report for 05/22/2017 HPI Details Patient Name: Edward Marquez, Edward Marquez Date of Service: 05/22/2017 8:00 AM Medical Record Number: [id number] Patient Account Number: [id number] Date of Birth/Sex: [birth year] (66 y.o. Male) Treating RN: Montey Hora Primary Care Provider: PATIENT, NO Other Clinician: Referring Provider: Vic Blackbird Treating Provider/Extender: Ricard Dillon Weeks in Treatment: 9 History of Present Illness HPI Description: 03/18/17 patient presents today for initial evaluation concerning an injury which occurred roughly 2 months ago when he was using a tiller at his home. He tells me that he was carrying this when it cut his left anterior shin. He did not go see anyone immediately as he felt that it would just heal on its own but obviously he tells me that's not the case. This ended up red and inflamed surrounding and he was diagnosed with cellulitis at the urgent care when he was seen on 02/19/17. He was started on Augmentin at that time and then had a follow-up wound care check on 03/11/17 at urgent care as well. Fortunately there is no evidence of infection at that point the Bactroban was prescribed for him at that time. He has been using that sense. Patient is unaware of the fact that he had issues with blood flow in his lower extremities bilaterally until all this happened and they began evaluating him at urgent care. Subsequently he has been scheduled for an arterial study with ABI and TBI on 03/15/17 and this will be scheduled for 04/10/17. 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 He states that he has discomfort about one out of 10 at rest described as a sharp/burning sensation that is worsened to four out of 10 with cleansing the wound.  Other than those medical conditions patient appears to be in good health and remains physically active even at 66 years of age. 03/26/17 on evaluation today patient appears to be doing fairly well in regard to his left lower extremity wound. The overall appearance is greatly improved although his measurements really have not shifted at all. He does have an appointment for a vascular evaluation due to his diminished blood flow in the bilateral lower extremities with associated history of potential vascular claudication. This appointment is scheduled for April 10, 2017 in [place]. Otherwise patient has no significant pain and states that he asked he feels like his "neuropathy" is doing better. 04/02/17; traumatic wound on his left anterior leg about 2-1/2 months ago which has not really made any substantial progression towards healing. The patient has been using Santyl. He has had a venous ultrasound that was negative for DVT. He has arterial studies and apparently a vascular surgeon consultation booked for 04/10/17. The patient has a St. Jude's aortic valve and is on Coumadin. He complains of discomfort predominantly in the plantar aspect of his left foot and lateral 4 toes. This is worse at night and may actually reflect plantar fasciitis. But with ongoing questioning I'm quite convinced that he has claudication approximately 100 yards when walking to his barn from his house aching discomfort in both legs 04/09/17; vascular studies and apparently vascular consultation both booked for tomorrow. He continues to complain of a burning sensation in his left leg which I think may be claudication. We have been using Santyl I think since the beginning  of his stay here. He will be traveling on vacation next week we will see him back in 2 weeks KYRELL, RUACHO ([id number]) 04/23/17; Mr. Maalouf saw Dr. Doren Custard of vascular surgery. His arterial studies showed a right ABI of  1.01. Noncompressible on the left side. There was no flow on the left great toe and sick TBI could not be obtained. TBI in the right is quoted at 0.09. He is apparently going for a arteriogram on 05/01/17. He has been using Santyl to the wound. He is using Santyl to the wound 05/14/17; since the patient was last in clinic he was admitted to hospital from 9/18 through 9/27. He underwent angiography and ultimately on 9/21 had a left above-knee popliteal to anterior tibial artery bypass with a saphenous vein graft. He is on chronic Coumadin secondary to periodic follow-up replacement therefore needed bridging with Lovenox and then return to Coumadin. He is doing well from a wound healing point of view but he still complains of edema and a discomfort in his foot at night when he elevates the leg. 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 I'm not clear exactly what he is using on the original left leg wound. As mentioned he is complaining of pain or some form of discomfort at night when his leg is elevated and having difficulty sleeping. He is not using the Percocet that was prescribed when he left the hospital 05/22/17; patient arrives today with his original wound on the left anterior leg looking much better. However roughly a week ago he tells me he developed skin damage on his bilateral toes. He has a new wound on the lateral part of the third toe at the level of the PIP. He states this is painful. We've been using silver collagen to the wound on the left anterior leg. He is status post revascularization Electronic Signature(s) Signed: 05/22/2017 4:12:03 PM By: Linton Ham MD Entered By: Linton Ham on 05/22/2017 10:16:20 [name] ([id number]) -------------------------------------------------------------------------------- Physical Exam Details Patient Name: [name] Date of Service: 05/22/2017 8:00 AM Medical Record Number: [id number] Patient Account Number: [id number] Date of Birth/Sex: [birth year] (66 y.o. Male) Treating RN: Montey Hora Primary Care Provider: PATIENT, NO Other Clinician: Referring Provider: Vic Blackbird Treating Provider/Extender: Ricard Dillon Weeks in Treatment: 9 Constitutional Sitting or standing Blood Pressure is within target range for patient.. Pulse regular and within target range for patient.Marland Kitchen Respirations regular, non-labored and within target range.. Temperature is normal and within the target range for the patient.Marland Kitchen appears in no distress. Eyes Conjunctivae clear. No discharge. Respiratory Respiratory effort is easy and symmetric bilaterally. Rate is normal at rest and on room air.. Cardiovascular Ainley palpable left dorsalis pedis. There is no lower extremity edema. Lymphatic None palpable in the popliteal or inguinal area. Integumentary (Hair, Skin) Denuded surface epithelium in all the toes of both feet. None of the usual other features however that suggest tinea pedis. Neurological Both knee jerks are normal. Markedly reduced sensation in the left foot. Psychiatric No evidence of depression, anxiety, or agitation. Calm, cooperative, and communicative. Appropriate interactions and affect.. Notes Wound exam oThe original oval-shaped wound on the anterior left tibial area continues to look a lot better. All of the patient's surgical wounds on the site appeared to be healing well. oThe patient has a new wound on the lateral part of the right third toe this is painful. There is erythema of the  toe and tenderness over the PIP. There is enough here that I was concerned about cellulitis in this area oDenuded surface epithelium in all of his toes almost reminiscent of a tinea pedis situation Electronic Signature(s) Signed: 05/22/2017 4:12:03 PM By: Linton Ham MD Entered By: Linton Ham on  05/22/2017 10:19:34 [name] ([id number]) -------------------------------------------------------------------------------- Physician Orders Details Patient Name: [name] Date of Service: 05/22/2017 8:00 AM Medical Record Number: [id number] Patient Account Number: [id number] Date of Birth/Sex: [birth year] (65 y.o. Male) Treating RN: Montey Hora Primary Care Provider: PATIENT, NO Other Clinician: Referring Provider: Vic Blackbird Treating Provider/Extender: Tito Dine in Treatment: 9 Verbal / Phone Orders: No Diagnosis Coding Wound Cleansing Wound #1 Left,Anterior Lower Leg o Clean wound with Normal Saline. Wound #2 Right,Lateral Toe Second o Clean wound with Normal Saline. Anesthetic Wound #1 Left,Anterior Lower Leg o Topical Lidocaine 4% cream applied to wound bed prior to debridement Wound #2 Right,Lateral Toe Second o Topical Lidocaine 4% cream applied to wound bed prior to debridement Skin Barriers/Peri-Wound Care Wound #1 Left,Anterior Lower Leg o Skin Prep Wound #2 Right,Lateral Toe Second o Skin Prep Primary Wound Dressing Wound #1 Left,Anterior Lower Leg o Promogran Wound #2 Right,Lateral Toe Second o Aquacel Ag Secondary Dressing Wound #1 Left,Anterior Lower Leg o Dry Gauze o Non-adherent pad - telfa island Wound #2 Right,Lateral Toe Second o Other - coverlet Arno, Farzad ([id number]) Dressing Change Frequency Wound #1 Left,Anterior Lower Leg o Change dressing every other day. Wound #2 Right,Lateral Toe Second o Change dressing every day. Follow-up Appointments Wound #1 Left,Anterior Lower Leg o Return Appointment in 1 week. Wound #2 Right,Lateral Toe Second o Return Appointment in 1 week. Edema Control Wound #1 Left,Anterior Lower Leg o Elevate legs to the level of the heart and pump ankles as often as possible Wound #2 Right,Lateral Toe Second o Elevate legs to the level of the heart and  pump ankles as often as possible Additional Orders / Instructions Wound #1 Left,Anterior Lower Leg o Increase protein intake. o Other: - Please add vitamin A, vitamin C and zinc supplements to your diet Wound #2 Right,Lateral Toe Second o Increase protein intake. o Other: - Please add vitamin A, vitamin C and zinc supplements to your diet Patient Medications Allergies: Iodinated Contrast- Oral and IV Dye Notifications Medication Indication Start End doxycycline monohydrate cellulitis lefft 05/22/2017 foot DOSE oral 100 mg capsule - 1 capsule oral bid Electronic Signature(s) Signed: 05/22/2017 9:48:36 AM By: Linton Ham MD Entered By: Linton Ham on 05/22/2017 09:48:34 [name] ([id number]) -------------------------------------------------------------------------------- Problem List Details Patient Name: [name] Date of Service: 05/22/2017 8:00 AM Medical Record Number: [id number] Patient Account Number: [id number] Date of Birth/Sex: [birth year] (66 y.o. 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 Male) Treating RN: Montey Hora Primary Care Provider: PATIENT, NO Other Clinician: Referring Provider: Vic Blackbird Treating Provider/Extender: Tito Dine in Treatment: 9 Subjective History of Present Illness (HPI) 03/18/17 patient presents today for initial evaluation concerning an injury which occurred roughly 2 months ago when he was using a tiller at his home. He tells me that he was carrying this when it cut his left anterior shin. He did not go see anyone immediately as he felt that it would just heal on its own but obviously he tells me that's not the case. This ended up red and inflamed surrounding and he was diagnosed with cellulitis at the urgent care when he was seen on 02/19/17. He was started on Augmentin at that time and then had a follow-up wound care check on 03/11/17 at urgent care as well. Fortunately there is no evidence of infection at that point the Bactroban was prescribed for him at that time. He has been using that sense. Patient is unaware of the fact that he had issues with blood flow in his lower extremities bilaterally until all this happened and they began evaluating him at urgent care. Subsequently he has been scheduled for an arterial study with ABI and TBI on 03/15/17 and this will be scheduled for 04/10/17. 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 He states that he has discomfort about one out of 10 at rest described as a sharp/burning sensation that is worsened  to four out of 10 with cleansing the wound. Other than those medical conditions patient appears to be in good health and remains physically active even at 66 years of age. 03/26/17 on evaluation today patient appears to be doing fairly well in regard to his left lower extremity wound. The overall appearance is greatly improved although his measurements really have not shifted at all. He does have an appointment for a vascular evaluation due to his diminished blood flow in the bilateral lower extremities with associated history of potential vascular claudication. This appointment is scheduled for April 10, 2017 in [place]. Otherwise patient has no significant pain and states that he asked he feels like his "neuropathy" is doing better. 04/02/17; traumatic wound on his left anterior leg about 2-1/2 months ago which has not really made any substantial progression towards healing. The patient has been using Santyl. He has had a venous ultrasound that was negative for DVT. He has arterial studies and apparently a vascular surgeon consultation booked for 04/10/17. The patient has a St. Jude's aortic valve and is on Coumadin. He complains of discomfort predominantly in the plantar aspect of his left foot and lateral 4 toes. This is worse at night and may actually reflect plantar fasciitis. But with ongoing questioning I'm quite convinced that he has claudication approximately 100 yards when walking to his barn from his house aching discomfort in both legs 04/09/17; vascular studies and apparently vascular consultation both booked for tomorrow. He continues to complain of a burning sensation in his left leg which I think may be claudication. We have been using Santyl I think since the beginning of his stay here. He will be traveling on vacation next week we will see him back in  2 weeks 04/23/17; Mr. Sessums saw Dr. Doren Custard of vascular surgery. His arterial studies showed a right ABI of  1.01. Noncompressible on the left side. There was no flow on the left great toe and sick TBI could not be obtained. TBI in the right is quoted at 0.09. He is apparently going for a arteriogram on 05/01/17. He has Ivan, Abelino ([id number]) been using Santyl to the wound. He is using Santyl to the wound 05/14/17; since the patient was last in clinic he was admitted to hospital from 9/18 through 9/27. He underwent angiography and ultimately on 9/21 had a left above-knee popliteal to anterior tibial artery bypass with a saphenous vein graft. He is on chronic Coumadin secondary to periodic follow-up replacement therefore needed bridging with Lovenox and then return to Coumadin. He is doing well from a wound healing point of view but he still complains of edema and a discomfort in his foot at night when he elevates the leg. 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 I'm not clear exactly what he is using on the original left leg wound. As mentioned he is complaining of pain or some form of discomfort at night when his leg is elevated and having difficulty sleeping. He is not using the Percocet that was prescribed when he left the hospital 05/22/17; patient arrives today with his original wound on the left anterior leg looking much better. However roughly a week ago he tells me he developed skin damage on his bilateral toes. He has a new wound on the lateral part of the third toe at the level of the PIP. He states this is painful. We've been using silver collagen to the wound on the left anterior leg. He is status post revascularization Objective Constitutional Sitting or standing Blood Pressure is within target range for patient.. Pulse regular and within target range for patient.Marland Kitchen Respirations regular, non-labored and within target range.. Temperature is normal and within the target range  for the patient.Marland Kitchen appears in no distress. Vitals Time Taken: 8:21 AM, Height: 70 in, Weight: 180 lbs, BMI: 25.8, Temperature: 97.7 F, Pulse: 95 bpm, Respiratory Rate: 18 breaths/min, Blood Pressure: 121/83 mmHg. Eyes Conjunctivae clear. No discharge. Respiratory Respiratory effort is easy and symmetric bilaterally. Rate is normal at rest and on room air.. Cardiovascular Ainley palpable left dorsalis pedis. There is no lower extremity edema. Lymphatic None palpable in the popliteal or inguinal area. Neurological Both knee jerks are normal. Markedly reduced sensation in the left foot. [place], [place] ([id number]) Psychiatric No evidence of depression, anxiety, or agitation. Calm, cooperative, and communicative. Appropriate interactions and affect.. General Notes: Wound exam The original oval-shaped wound on the anterior left tibial area continues to look a lot better. All of the patient's surgical wounds on the site appeared to be healing well. The patient has a new wound on the lateral part of the right third toe this is painful. There is erythema of the toe and tenderness over the PIP. There is enough here that I was concerned about cellulitis in this area Denuded surface epithelium in all of his toes almost reminiscent of a tinea pedis situation Integumentary (Hair, Skin) Denuded surface epithelium in all the toes of both feet. None of the usual other features however that suggest tinea pedis. Wound #1 status is Open. Original cause of wound was Trauma. The wound is located on the Left,Anterior Lower Leg. The wound measures 1cm length  x 0.3cm width x 0.1cm depth; 0.236cm^2 area and 0.024cm^3 volume. There is Fat Layer (Subcutaneous Tissue) Exposed exposed. There is no tunneling or undermining noted. There is a large amount of serosanguineous drainage noted. The wound margin is epibole. There is large (67-100%) red granulation within the wound bed. There is a small (1-33%) amount  of necrotic tissue within the wound bed including Adherent Slough. The periwound skin appearance exhibited: Erythema. The periwound skin appearance did not exhibit: Callus, Crepitus, Excoriation, Induration, Rash, Scarring, Dry/Scaly, Maceration, Atrophie Blanche, Cyanosis, Ecchymosis, Hemosiderin Staining, Mottled, Pallor, Rubor. The surrounding wound skin color is noted with erythema which is circumferential. Periwound temperature was noted as No Abnormality. The periwound has tenderness on palpation. Wound #2 status is Open. Original cause of wound was Gradually Appeared. The wound is located on the Right,Lateral Toe Second. The wound measures 0.2cm length x 0.2cm width x 0.1cm depth; 0.031cm^2 area and 0.003cm^3 volume. There is no tunneling or undermining noted. There is a medium amount of serous drainage noted. The wound margin is flat and intact. There is large (67-100%) pink granulation within the wound bed. There is a small (1-33%) amount of necrotic tissue within the wound bed including Adherent Slough. The periwound skin appearance exhibited: Erythema. The periwound skin appearance did not exhibit: Callus, Crepitus, Excoriation, Induration, Rash, Scarring, Dry/Scaly, Maceration, Atrophie Blanche, Cyanosis, Ecchymosis, Hemosiderin Staining, Mottled, Pallor, Rubor. The surrounding wound skin color is noted with erythema which is circumferential. Periwound temperature was noted as No Abnormality. The periwound has tenderness on palpation. 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 Wound #2 Right,Lateral Toe Second: Clean wound with Normal Saline. Anesthetic: Wound #1 Left,Anterior Lower Leg: Topical Lidocaine 4% cream applied to wound bed prior to debridement Wound #2 Right,Lateral Toe Second: Topical Lidocaine 4% cream applied to wound bed prior to debridement Skin Barriers/Peri-Wound Care: Wound #1 Left,Anterior Lower Leg: Skin Prep Wound #2 Right,Lateral Toe Second: Skin Prep Primary Wound Dressing: Wound #1 Left,Anterior Lower Leg: Promogran Wound #2 Right,Lateral Toe Second: Aquacel Ag Secondary Dressing: Wound #1 Left,Anterior Lower Leg: Dry Gauze Non-adherent pad - telfa island Wound #2 Right,Lateral Toe Second: Other - coverlet Dressing Change Frequency: Wound #1 Left,Anterior Lower Leg: Change dressing every other day. Wound #2 Right,Lateral Toe Second: Change dressing every day. Follow-up Appointments: Wound #1 Left,Anterior Lower Leg: Return Appointment in 1 week. Wound #2 Right,Lateral Toe Second: Return Appointment in 1 week. Edema Control: Wound #1 Left,Anterior Lower Leg: Elevate legs to the level of the heart and pump ankles as often as possible Wound #2 Right,Lateral Toe Second: Elevate legs to the level of the heart and pump ankles as often as possible Altmann, Chidi ([id number]) Additional Orders / Instructions: Wound #1 Left,Anterior Lower Leg: Increase protein intake. Other: - Please add vitamin A, vitamin C and zinc supplements to your diet Wound #2 Right,Lateral Toe Second: Increase protein intake. Other: - Please add vitamin A, vitamin C and zinc supplements to your diet The following medication(s) was prescribed: doxycycline monohydrate oral 100 mg capsule 1 capsule oral bid for cellulitis lefft foot starting 05/22/2017  o #1 we are going to continue with silver collagen to the left anterior leg wound which appears to be  making superb progress post revascularization #2 he has a new wound on the lateral right third toe. Light silver alginate Kerlix to this area. This dressing will need to be changed #3 I am puzzled with regards to the loss of surface epithelium on his bilateral 10 toes. 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 I am assuming that this is peripheral nerve damage at some level following his surgery or perhaps his arteriogram. The prognosis for this is usually good Electronic Signature(s) Signed: 05/22/2017 10:26:35 AM By: Linton Ham MD Entered By: Linton Ham on 05/22/2017 10:26:34 [name] ([id number]) -------------------------------------------------------------------------------- [place] Details Patient Name: [name] Date of Service: 05/22/2017 Medical Record Number: [id number] Patient Account Number: [id number] Date of Birth/Sex: 12/25/1950 (67 y.o. 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

## 2017-05-23 NOTE — Telephone Encounter (Signed)
Returned call to pt, c/o being L LE swelling. Stated L foot ached all night and does not have much feeling, L LE is still swollen but stated he is keeping it elevate above the level of his heart. Pt stated he is not using his oxycodone b/c of constipation. Pt also stated he has been unable to rest at nigh due to the discomfort. Denied fever/chills and any color changes to his L LE. Advised pt to continue to keep his leg elevated and try taking one oxycodone q 8 hours instead of 6 to help with his discomfort at night. Also advised pt to call our office if his pain becomes severe, his foot feels cold or he starts to run a fever. Pt verbalized understanding and agreed with this plan.

## 2017-05-23 NOTE — Progress Notes (Signed)
BRAXDEN, LOVERING ([id number]) Visit Report for 05/22/2017 Arrival Information Details Patient Name: Edward Marquez, Edward Marquez Date of Service: 05/22/2017 8:00 AM Medical Record Number: [id number] Patient Account Number: [id number] Date of Birth/Sex: 1951/05/03 (66 y.o. Male) Treating RN: Montey Hora Primary Care Jema Deegan: PATIENT, NO Other Clinician: Referring Meisha Salone: Vic Blackbird Treating Navreet Bolda/Extender: Tito Dine in Treatment: 9 Visit Information History Since Last Visit Added or deleted any medications: No Patient Arrived: Ambulatory Any new allergies or adverse reactions: No Arrival Time: 08:20 Had a fall or experienced change in No Accompanied By: self activities of daily living that may affect Transfer Assistance: None risk of falls: Patient Identification Verified: Yes Signs or symptoms of abuse/neglect since last No Secondary Verification Process Yes visito Completed: Hospitalized since last visit: No Patient Requires Transmission- No Has Dressing in Place as Prescribed: Yes Based Precautions: Pain Present Now: No Patient Has Alerts: Yes Patient Alerts: Patient on Blood Thinner ABI Ashley BILATERAL >220 Coumadin Electronic Signature(s) Signed: 05/22/2017 4:35:12 PM By: Montey Hora Entered By: Montey Hora on 05/22/2017 08:20:32 Edward Marquez ([id number]) -------------------------------------------------------------------------------- Clinic Level of Care Assessment Details Patient Name: Edward Marquez Date of Service: 05/22/2017 8:00 AM Medical Record Number: [id number] Patient Account Number: [id number] Date of Birth/Sex: 08/02/1951 (66 y.o. Male) Treating RN: Montey Hora Primary Care Sydnei Ohaver: PATIENT, NO Other Clinician: Referring Avey Mcmanamon: Vic Blackbird Treating Qais Jowers/Extender: Tito Dine in Treatment: 9 Clinic Level of Care Assessment Items TOOL 4 Quantity Score []  - Use when only an EandM is performed on FOLLOW-UP visit  0 ASSESSMENTS - Nursing Assessment / Reassessment X - Reassessment of Co-morbidities (includes updates in patient status) 1 10 X - Reassessment of Adherence to Treatment Plan 1 5 ASSESSMENTS - Wound and Skin Assessment / Reassessment []  - Simple Wound Assessment / Reassessment - one wound 0 X - Complex Wound Assessment / Reassessment - multiple wounds 2 5 []  - Dermatologic / Skin Assessment (not related to wound area) 0 ASSESSMENTS - Focused Assessment []  - Circumferential Edema Measurements - multi extremities 0 []  - Nutritional Assessment / Counseling / Intervention 0 X - Lower Extremity Assessment (monofilament, tuning fork, pulses) 1 5 []  - Peripheral Arterial Disease Assessment (using hand held doppler) 0 ASSESSMENTS - Ostomy and/or Continence Assessment and Care []  - Incontinence Assessment and Management 0 []  - Ostomy Care Assessment and Management (repouching, etc.) 0 PROCESS - Coordination of Care X - Simple Patient / Family Education for ongoing care 1 15 []  - Complex (extensive) Patient / Family Education for ongoing care 0 []  - Staff obtains Programmer, systems, Records, Test Results / Process Orders 0 []  - Staff telephones HHA, Nursing Homes / Clarify orders / etc 0 []  - Routine Transfer to another Facility (non-emergent condition) 0 Petrey, [place] ([id number]) []  - Routine Hospital Admission (non-emergent condition) 0 []  - New Admissions / Biomedical engineer / Ordering NPWT, Apligraf, etc. 0 []  - Emergency Hospital Admission (emergent condition) 0 X - Simple Discharge Coordination 1 10 []  - Complex (extensive) Discharge Coordination 0 PROCESS - Special Needs []  - Pediatric / Minor Patient Management 0 []  - Isolation Patient Management 0 []  - Hearing / Language / Visual special needs 0 []  - Assessment of Community assistance (transportation, D/C planning, etc.) 0 []  - Additional assistance / Altered mentation 0 []  - Support Surface(s) Assessment (bed, cushion, seat, etc.)  0 INTERVENTIONS - Wound Cleansing / Measurement []  - Simple Wound Cleansing - one wound 0 X - Complex Wound Cleansing - multiple wounds 2 5 X - Wound Imaging (photographs -  any number of wounds) 1 5 []  - Wound Tracing (instead of photographs) 0 []  - Simple Wound Measurement - one wound 0 X - Complex Wound Measurement - multiple wounds 2 5 INTERVENTIONS - Wound Dressings X - Small Wound Dressing one or multiple wounds 2 10 []  - Medium Wound Dressing one or multiple wounds 0 []  - Large Wound Dressing one or multiple wounds 0 []  - Application of Medications - topical 0 []  - Application of Medications - injection 0 INTERVENTIONS - Miscellaneous []  - External ear exam 0 Kolenovic, Cyler ([id number]) []  - Specimen Collection (cultures, biopsies, blood, body fluids, etc.) 0 []  - Specimen(s) / Culture(s) sent or taken to Lab for analysis 0 []  - Patient Transfer (multiple staff / Harrel Lemon Lift / Similar devices) 0 []  - Simple Staple / Suture removal (25 or less) 0 []  - Complex Staple / Suture removal (26 or more) 0 []  - Hypo / Hyperglycemic Management (close monitor of Blood Glucose) 0 []  - Ankle / Brachial Index (ABI) - do not check if billed separately 0 X - Vital Signs 1 5 Has the patient been seen at the hospital within the last three years: Yes Total Score: 105 Level Of Care: New/Established - Level 3 Electronic Signature(s) Signed: 05/22/2017 4:35:12 PM By: Montey Hora Entered By: Montey Hora on 05/22/2017 13:41:03 Edward Marquez ([id number]) -------------------------------------------------------------------------------- Encounter Discharge Information Details Patient Name: Edward Marquez Date of Service: 05/22/2017 8:00 AM Medical Record Number: [id number] Patient Account Number: [id number] Date of Birth/Sex: 10-13-1950 (66 y.o. Male) Treating RN: Montey Hora Primary Care Carmel Garfield: PATIENT, NO Other Clinician: Referring Lyndon Chapel: Vic Blackbird Treating Oyindamola Key/Extender:  Tito Dine in Treatment: 9 Encounter Discharge Information Items Discharge Pain Level: 0 Discharge Condition: Stable Ambulatory Status: Ambulatory Discharge Destination: Home Transportation: Private Auto Accompanied By: self Schedule Follow-up Appointment: Yes Medication Reconciliation completed and provided to Patient/Care No Damarko Stitely: Provided on Clinical Summary of Care: 05/22/2017 Form Type Recipient Paper Patient BW Electronic Signature(s) Signed: 05/22/2017 4:35:12 PM By: Montey Hora Entered By: Montey Hora on 05/22/2017 16:11:00 Edward Marquez ([id number]) -------------------------------------------------------------------------------- Lower Extremity Assessment Details Patient Name: Edward Marquez Date of Service: 05/22/2017 8:00 AM Medical Record Number: [id number] Patient Account Number: [id number] Date of Birth/Sex: Jul 25, 1951 (66 y.o. Male) Treating RN: Montey Hora Primary Care Jhoana Upham: PATIENT, NO Other Clinician: Referring Tasnim Balentine: Vic Blackbird Treating Jennea Rager/Extender: Ricard Dillon Weeks in Treatment: 9 Vascular Assessment Pulses: Dorsalis Pedis Palpable: [Left:Yes] Posterior Tibial Extremity colors, hair growth, and conditions: Extremity Color: [Left:Hyperpigmented] Hair Growth on Extremity: [Left:No] Temperature of Extremity: [Left:Warm] Capillary Refill: [Left:< 3 seconds] Electronic Signature(s) Signed: 05/22/2017 4:35:12 PM By: Montey Hora Entered By: Montey Hora on 05/22/2017 08:39:13 Edward Marquez ([id number]) -------------------------------------------------------------------------------- Multi Wound Chart Details Patient Name: Edward Marquez Date of Service: 05/22/2017 8:00 AM Medical Record Number: [id number] Patient Account Number: [id number] Date of Birth/Sex: [birth year] (66 y.o. Male) Treating RN: Montey Hora Primary Care Alley Neils: PATIENT, NO Other Clinician: Referring Viola Kinnick: Vic Blackbird Treating Myshawn Chiriboga/Extender: Ricard Dillon Weeks in Treatment: 9 Vital Signs Height(in): 70 Pulse(bpm): 95 Weight(lbs): 180 Blood Pressure 121/83 (mmHg): Body Mass Index(BMI): 26 Temperature(F): 97.7 Respiratory Rate 18 (breaths/min): Photos: [N/A:N/A] Wound Location: Left Lower Leg - Anterior Right Toe Second - N/A Lateral Wounding Event: Trauma Gradually Appeared N/A Primary Etiology: Trauma, Other Arterial Insufficiency Ulcer N/A Comorbid History: Arrhythmia, Hypertension, Arrhythmia, Hypertension, N/A Gout Gout Date Acquired: 02/15/2017 05/22/2017 N/A Weeks of Treatment: 9 0 N/A Wound Status: Open Open N/A Measurements L x W x D 1x0.3x0.1 0.2x0.2x0.1 N/A (cm)  Area (cm) : 0.236 0.031 N/A Volume (cm) : 0.024 0.003 N/A % Reduction in Area: 59.90% N/A N/A % Reduction in Volume: 79.70% N/A N/A Classification: Full Thickness With Partial Thickness N/A Exposed Support Structures Exudate Amount: Large Medium N/A Exudate Type: Serosanguineous Serous N/A Exudate Color: red, brown amber N/A Wound Margin: Epibole Flat and Intact N/A Granulation Amount: Large (67-100%) Large (67-100%) N/A Erbes, Jaison ([id number]) Granulation Quality: Red Pink N/A Necrotic Amount: Small (1-33%) Small (1-33%) N/A Exposed Structures: Fat Layer (Subcutaneous Fascia: No N/A Tissue) Exposed: Yes Fat Layer (Subcutaneous Fascia: No Tissue) Exposed: No Tendon: No Tendon: No Muscle: No Muscle: No Joint: No Joint: No Bone: No Bone: No Epithelialization: None None N/A Periwound Skin Texture: Excoriation: No Excoriation: No N/A Induration: No Induration: No Callus: No Callus: No Crepitus: No Crepitus: No Rash: No Rash: No Scarring: No Scarring: No Periwound Skin Maceration: No Maceration: No N/A Moisture: Dry/Scaly: No Dry/Scaly: No Periwound Skin Color: Erythema: Yes Erythema: Yes N/A Atrophie Blanche: No Atrophie Blanche: No Cyanosis: No Cyanosis:  No Ecchymosis: No Ecchymosis: No Hemosiderin Staining: No Hemosiderin Staining: No Mottled: No Mottled: No Pallor: No Pallor: No Rubor: No Rubor: No Erythema Location: Circumferential Circumferential N/A Temperature: No Abnormality No Abnormality N/A Tenderness on Yes Yes N/A Palpation: Wound Preparation: Ulcer Cleansing: Ulcer Cleansing: N/A Rinsed/Irrigated with Rinsed/Irrigated with Saline Saline Topical Anesthetic Topical Anesthetic Applied: Other: lidocaine Applied: None 4% Treatment Notes Electronic Signature(s) Signed: 05/22/2017 4:12:03 PM By: Linton Ham MD Entered By: Linton Ham on 05/22/2017 10:14:40 Edward Marquez ([id number]) -------------------------------------------------------------------------------- [place] Details Patient Name: Edward Marquez Date of Service: 05/22/2017 8:00 AM Medical Record Number: [id number] Patient Account Number: [id number] Date of Birth/Sex: 23-Oct-1950 (66 y.o. Male) Treating RN: Montey Hora Primary Care Maudene Stotler: PATIENT, NO Other Clinician: Referring Anahy Esh: Vic Blackbird Treating Miquel Lamson/Extender: Tito Dine in Treatment: 9 Active Inactive ` Orientation to the Wound Care Program Nursing Diagnoses: Knowledge deficit related to the wound healing center program Goals: Patient/caregiver will verbalize understanding of the [facility] Date Initiated: 03/18/2017 Target Resolution Date: 05/24/2017 Goal Status: Active Interventions: Provide education on orientation to the wound center Notes: ` Wound/Skin Impairment Nursing Diagnoses: Impaired tissue integrity Goals: Ulcer/skin breakdown will have a volume reduction of 30% by week 4 Date Initiated: 03/18/2017 Target Resolution Date: 05/24/2017 Goal Status: Active Ulcer/skin breakdown will have a volume reduction of 50% by week 8 Date Initiated: 03/18/2017 Target Resolution Date: 05/24/2017 Goal Status:  Active Ulcer/skin breakdown will have a volume reduction of 80% by week 12 Date Initiated: 03/18/2017 Target Resolution Date: 05/24/2017 Goal Status: Active Ulcer/skin breakdown will heal within 14 weeks Date Initiated: 03/18/2017 Target Resolution Date: 05/24/2017 Goal Status: Active Interventions: ASCHER, SCHROEPFER ([id number]) Assess patient/caregiver ability to obtain necessary supplies Assess patient/caregiver ability to perform ulcer/skin care regimen upon admission and as needed Assess ulceration(s) every visit Notes: Electronic Signature(s) Signed: 05/22/2017 4:35:12 PM By: Montey Hora Entered By: Montey Hora on 05/22/2017 09:02:25 Edward Marquez ([id number]) -------------------------------------------------------------------------------- Pain Assessment Details Patient Name: Edward Marquez Date of Service: 05/22/2017 8:00 AM Medical Record Number: [id number] Patient Account Number: [id number] Date of Birth/Sex: Jan 31, 1951 (66 y.o. Male) Treating RN: Montey Hora Primary Care Kandra Graven: PATIENT, NO Other Clinician: Referring Mahamadou Weltz: Vic Blackbird Treating Ezzie Senat/Extender: Ricard Dillon Weeks in Treatment: 9 Active Problems Location of Pain Severity and Description of Pain Patient Has Paino Yes Site Locations Pain Location: Generalized Pain With Dressing Change: No Duration of the Pain. Constant / Intermittento Constant Pain Management and Medication Current Pain  Management: Notes Topical or injectable lidocaine is offered to patient for acute pain when surgical debridement is performed. If needed, Patient is instructed to use over the counter pain medication for the following 24-48 hours after debridement. Wound care MDs do not prescribed pain medications. Patient has chronic pain or uncontrolled pain. Patient has been instructed to make an appointment with their Primary Care Physician for pain management. Electronic Signature(s) Signed: 05/22/2017  4:35:12 PM By: Montey Hora Entered By: Montey Hora on 05/22/2017 08:20:47 Edward Marquez ([id number]) -------------------------------------------------------------------------------- Patient/Caregiver Education Details Patient Name: Edward Marquez Date of Service: 05/22/2017 8:00 AM Medical Record Number: [id number] Patient Account Number: [id number] Date of Birth/Gender: 10/31/1950 (66 y.o. Male) Treating RN: Montey Hora Primary Care Physician: PATIENT, NO Other Clinician: Referring Physician: Vic Blackbird Treating Physician/Extender: Tito Dine in Treatment: 9 Education Assessment Education Provided To: Patient Education Topics Provided Wound/Skin Impairment: Handouts: Other: wound care as ordered Methods: Demonstration, Explain/Verbal Responses: State content correctly Electronic Signature(s) Signed: 05/22/2017 4:35:12 PM By: Montey Hora Entered By: Montey Hora on 05/22/2017 16:11:49 Edward Marquez ([id number]) -------------------------------------------------------------------------------- Wound Assessment Details Patient Name: Edward Marquez Date of Service: 05/22/2017 8:00 AM Medical Record Number: [id number] Patient Account Number: [id number] Date of Birth/Sex: 1951-05-16 (66 y.o. Male) Treating RN: Montey Hora Primary Care Jevante Hollibaugh: PATIENT, NO Other Clinician: Referring Adalena Abdulla: Vic Blackbird Treating Campbell Kray/Extender: Ricard Dillon Weeks in Treatment: 9 Wound Status Wound Number: 1 Primary Etiology: Trauma, Other Wound Location: Left Lower Leg - Anterior Wound Status: Open Wounding Event: Trauma Comorbid History: Arrhythmia, Hypertension, Gout Date Acquired: 02/15/2017 Weeks Of Treatment: 9 Clustered Wound: No Photos Photo Uploaded By: Montey Hora on 05/22/2017 09:43:01 Wound Measurements Length: (cm) 1 Width: (cm) 0.3 Depth: (cm) 0.1 Area: (cm) 0.236 Volume: (cm) 0.024 % Reduction in Area: 59.9% % Reduction  in Volume: 79.7% Epithelialization: None Tunneling: No Undermining: No Wound Description Full Thickness With Exposed Classification: Support Structures Wound Margin: Epibole Exudate Large Amount: Exudate Type: Serosanguineous Exudate Color: red, brown Foul Odor After Cleansing: No Slough/Fibrino Yes Wound Bed Granulation Amount: Large (67-100%) Exposed Structure Granulation Quality: Red Fascia Exposed: No Necrotic Amount: Small (1-33%) Fat Layer (Subcutaneous Tissue) Exposed: Yes Peeples, Mccade ([id number]) Necrotic Quality: Adherent Slough Tendon Exposed: No Muscle Exposed: No Joint Exposed: No Bone Exposed: No Periwound Skin Texture Texture Color No Abnormalities Noted: No No Abnormalities Noted: No Callus: No Atrophie Blanche: No Crepitus: No Cyanosis: No Excoriation: No Ecchymosis: No Induration: No Erythema: Yes Rash: No Erythema Location: Circumferential Scarring: No Hemosiderin Staining: No Mottled: No Moisture Pallor: No No Abnormalities Noted: No Rubor: No Dry / Scaly: No Maceration: No Temperature / Pain Temperature: No Abnormality Tenderness on Palpation: Yes Wound Preparation Ulcer Cleansing: Rinsed/Irrigated with Saline Topical Anesthetic Applied: Other: lidocaine 4%, Treatment Notes Wound #1 (Left, Anterior Lower Leg) 1. Cleansed with: Clean wound with Normal Saline 2. Anesthetic Topical Lidocaine 4% cream to wound bed prior to debridement 4. Dressing Applied: Prisma Ag 5. Secondary Dressing Applied Dry Kaanapali Signature(s) Signed: 05/22/2017 4:35:12 PM By: Montey Hora Entered By: Montey Hora on 05/22/2017 08:35:24 Edward Marquez ([id number]) -------------------------------------------------------------------------------- Wound Assessment Details Patient Name: Edward Marquez Date of Service: 05/22/2017 8:00 AM Medical Record Number: [id number] Patient Account Number: [id number] Date of Birth/Sex:  06-20-1951 (66 y.o. Male) Treating RN: Montey Hora Primary Care Dereka Lueras: PATIENT, NO Other Clinician: Referring Zanae Kuehnle: Vic Blackbird Treating Jozi Malachi/Extender: Ricard Dillon Weeks in Treatment: 9 Wound Status Wound Number: 2 Primary Etiology: Arterial Insufficiency Ulcer Wound Location: Right Toe Second - Lateral  Wound Status: Open Wounding Event: Gradually Appeared Comorbid History: Arrhythmia, Hypertension, Gout Date Acquired: 05/22/2017 Weeks Of Treatment: 0 Clustered Wound: No Photos Photo Uploaded By: Montey Hora on 05/22/2017 09:43:01 Wound Measurements Length: (cm) 0.2 Width: (cm) 0.2 Depth: (cm) 0.1 Area: (cm) 0.031 Volume: (cm) 0.003 % Reduction in Area: % Reduction in Volume: Epithelialization: None Tunneling: No Undermining: No Wound Description Classification: Partial Thickness Wound Margin: Flat and Intact Exudate Amount: Medium Exudate Type: Serous Exudate Color: amber Foul Odor After Cleansing: No Slough/Fibrino Yes Wound Bed Granulation Amount: Large (67-100%) Exposed Structure Granulation Quality: Pink Fascia Exposed: No Necrotic Amount: Small (1-33%) Fat Layer (Subcutaneous Tissue) Exposed: No Necrotic Quality: Adherent Slough Tendon Exposed: No Rick, Leyland ([id number]) Muscle Exposed: No Joint Exposed: No Bone Exposed: No Periwound Skin Texture Texture Color No Abnormalities Noted: No No Abnormalities Noted: No Callus: No Atrophie Blanche: No Crepitus: No Cyanosis: No Excoriation: No Ecchymosis: No Induration: No Erythema: Yes Rash: No Erythema Location: Circumferential Scarring: No Hemosiderin Staining: No Mottled: No Moisture Pallor: No No Abnormalities Noted: No Rubor: No Dry / Scaly: No Maceration: No Temperature / Pain Temperature: No Abnormality Tenderness on Palpation: Yes Wound Preparation Ulcer Cleansing: Rinsed/Irrigated with Saline Topical Anesthetic Applied: None Treatment Notes Wound #2  (Right, Lateral Toe Second) 1. Cleansed with: Clean wound with Normal Saline 2. Anesthetic Topical Lidocaine 4% cream to wound bed prior to debridement 4. Dressing Applied: Aquacel Ag Other dressing (specify in notes) Notes coverlet Electronic Signature(s) Signed: 05/22/2017 4:35:12 PM By: Montey Hora Entered By: Montey Hora on 05/22/2017 08:35:06 Edward Marquez ([id number]) -------------------------------------------------------------------------------- [place] Details Patient Name: Edward Marquez Date of Service: 05/22/2017 8:00 AM Medical Record Number: [id number] Patient Account Number: [id number] Date of Birth/Sex: 25-May-1951 (66 y.o. Male) Treating RN: Montey Hora Primary Care Lessly Stigler: PATIENT, NO Other Clinician: Referring Neyla Gauntt: Vic Blackbird Treating Moriya Mitchell/Extender: Tito Dine in Treatment: 9 Vital Signs Time Taken: 08:21 Temperature (F): 97.7 Height (in): 70 Pulse (bpm): 95 Weight (lbs): 180 Respiratory Rate (breaths/min): 18 Body Mass Index (BMI): 25.8 Blood Pressure (mmHg): 121/83 Reference Range: 80 - 120 mg / dl Electronic Signature(s) Signed: 05/22/2017 4:35:12 PM By: Montey Hora Entered By: Montey Hora on 05/22/2017 08:22:36

## 2017-05-28 ENCOUNTER — Encounter: Payer: Self-pay | Admitting: Vascular Surgery

## 2017-05-29 ENCOUNTER — Encounter: Payer: Medicare Other | Admitting: Vascular Surgery

## 2017-05-29 ENCOUNTER — Ambulatory Visit: Payer: Medicare Other | Admitting: Internal Medicine

## 2017-05-29 ENCOUNTER — Ambulatory Visit: Payer: Self-pay | Admitting: Family Medicine

## 2017-05-30 DIAGNOSIS — Z5181 Encounter for therapeutic drug level monitoring: Secondary | ICD-10-CM | POA: Diagnosis not present

## 2017-05-30 DIAGNOSIS — Z7901 Long term (current) use of anticoagulants: Secondary | ICD-10-CM | POA: Diagnosis not present

## 2017-05-30 DIAGNOSIS — I4891 Unspecified atrial fibrillation: Secondary | ICD-10-CM | POA: Diagnosis not present

## 2017-05-30 DIAGNOSIS — Z952 Presence of prosthetic heart valve: Secondary | ICD-10-CM | POA: Diagnosis not present

## 2017-05-30 DIAGNOSIS — R791 Abnormal coagulation profile: Secondary | ICD-10-CM | POA: Diagnosis not present

## 2017-05-31 DIAGNOSIS — Z952 Presence of prosthetic heart valve: Secondary | ICD-10-CM | POA: Diagnosis not present

## 2017-05-31 DIAGNOSIS — Z7901 Long term (current) use of anticoagulants: Secondary | ICD-10-CM | POA: Diagnosis not present

## 2017-05-31 DIAGNOSIS — Z5181 Encounter for therapeutic drug level monitoring: Secondary | ICD-10-CM | POA: Diagnosis not present

## 2017-05-31 DIAGNOSIS — R791 Abnormal coagulation profile: Secondary | ICD-10-CM | POA: Diagnosis not present

## 2017-05-31 DIAGNOSIS — I4891 Unspecified atrial fibrillation: Secondary | ICD-10-CM | POA: Diagnosis not present

## 2017-06-04 ENCOUNTER — Encounter: Payer: Medicare Other | Admitting: Internal Medicine

## 2017-06-04 DIAGNOSIS — S81802A Unspecified open wound, left lower leg, initial encounter: Secondary | ICD-10-CM | POA: Diagnosis not present

## 2017-06-04 DIAGNOSIS — Z952 Presence of prosthetic heart valve: Secondary | ICD-10-CM | POA: Diagnosis not present

## 2017-06-04 DIAGNOSIS — I10 Essential (primary) hypertension: Secondary | ICD-10-CM | POA: Diagnosis not present

## 2017-06-04 DIAGNOSIS — I739 Peripheral vascular disease, unspecified: Secondary | ICD-10-CM | POA: Diagnosis not present

## 2017-06-04 DIAGNOSIS — M109 Gout, unspecified: Secondary | ICD-10-CM | POA: Diagnosis not present

## 2017-06-04 DIAGNOSIS — I4891 Unspecified atrial fibrillation: Secondary | ICD-10-CM | POA: Diagnosis not present

## 2017-06-04 DIAGNOSIS — L97822 Non-pressure chronic ulcer of other part of left lower leg with fat layer exposed: Secondary | ICD-10-CM | POA: Diagnosis not present

## 2017-06-04 DIAGNOSIS — Z5181 Encounter for therapeutic drug level monitoring: Secondary | ICD-10-CM | POA: Diagnosis not present

## 2017-06-04 DIAGNOSIS — I482 Chronic atrial fibrillation: Secondary | ICD-10-CM | POA: Diagnosis not present

## 2017-06-04 DIAGNOSIS — Z7901 Long term (current) use of anticoagulants: Secondary | ICD-10-CM | POA: Diagnosis not present

## 2017-06-05 DIAGNOSIS — Z7901 Long term (current) use of anticoagulants: Secondary | ICD-10-CM | POA: Diagnosis not present

## 2017-06-05 DIAGNOSIS — I428 Other cardiomyopathies: Secondary | ICD-10-CM | POA: Diagnosis not present

## 2017-06-05 DIAGNOSIS — I480 Paroxysmal atrial fibrillation: Secondary | ICD-10-CM | POA: Diagnosis not present

## 2017-06-05 DIAGNOSIS — Z951 Presence of aortocoronary bypass graft: Secondary | ICD-10-CM | POA: Diagnosis not present

## 2017-06-05 DIAGNOSIS — I481 Persistent atrial fibrillation: Secondary | ICD-10-CM | POA: Diagnosis not present

## 2017-06-06 DIAGNOSIS — I480 Paroxysmal atrial fibrillation: Secondary | ICD-10-CM | POA: Diagnosis not present

## 2017-06-06 NOTE — Progress Notes (Signed)
CRECENCIO, KWIATEK ([id number]) Visit Report for 06/04/2017 Arrival Information Details Patient Name: YOSHIMI, SARR Date of Service: 06/04/2017 9:15 AM Medical Record Number: [id number] Patient Account Number: [id number] Date of Birth/Sex: October 30, 1950 (66 y.o. Male) Treating RN: Montey Hora Primary Care Callen Zuba: PATIENT, NO Other Clinician: Referring Alithia Zavaleta: Vic Blackbird Treating Jahira Swiss/Extender: Tito Dine in Treatment: 11 Visit Information History Since Last Visit Added or deleted any medications: No Patient Arrived: Ambulatory Any new allergies or adverse reactions: No Arrival Time: 09:38 Had a fall or experienced change in No Accompanied By: self activities of daily living that may affect Transfer Assistance: None risk of falls: Patient Identification Verified: Yes Signs or symptoms of abuse/neglect since last visito No Secondary Verification Process Yes Hospitalized since last visit: No Completed: Has Dressing in Place as Prescribed: Yes Patient Requires Transmission-Based No Pain Present Now: No Precautions: Patient Has Alerts: Yes Patient Alerts: Patient on Blood Thinner ABI Eustace BILATERAL >220 Coumadin Electronic Signature(s) Signed: 06/04/2017 5:26:58 PM By: Montey Hora Entered By: Montey Hora on 06/04/2017 09:38:39 [name] ([id number]) -------------------------------------------------------------------------------- Encounter Discharge Information Details Patient Name: [name] Date of Service: 06/04/2017 9:15 AM Medical Record Number: [id number] Patient Account Number: [id number] Date of Birth/Sex: 1950-10-29 (66 y.o. Male) Treating RN: Montey Hora Primary Care Leinaala Catanese: PATIENT, NO Other Clinician: Referring Alanna Storti: Vic Blackbird Treating Seba Madole/Extender: Tito Dine in Treatment: 11 Encounter Discharge Information Items Discharge Pain Level: 0 Discharge Condition: Stable Ambulatory Status:  Ambulatory Discharge Destination: Home Transportation: Private Auto Accompanied By: self Schedule Follow-up Appointment: Yes Medication Reconciliation completed and No provided to Patient/Care Tyniah Kastens: Patient Clinical Summary of Care: Declined Electronic Signature(s) Signed: 06/04/2017 4:39:05 PM By: Ruthine Dose Entered By: Ruthine Dose on 06/04/2017 10:17:48 [name] ([id number]) -------------------------------------------------------------------------------- Lower Extremity Assessment Details Patient Name: [name] Date of Service: 06/04/2017 9:15 AM Medical Record Number: [id number] Patient Account Number: [id number] Date of Birth/Sex: [birth year] (66 y.o. Male) Treating RN: Montey Hora Primary Care Vernetta Dizdarevic: PATIENT, NO Other Clinician: Referring Malashia Kamaka: Vic Blackbird Treating Enes Rokosz/Extender: Tito Dine in Treatment: 11 Vascular Assessment Pulses: Dorsalis Pedis Palpable: [Right:No] Doppler Audible: [Right:Inaudible] Posterior Tibial Palpable: [Right:No] Doppler Audible: [Right:Yes] Extremity colors, hair growth, and conditions: Extremity Color: [Left:Normal] [Right:Normal] Hair Growth on Extremity: [Left:Yes] [Right:Yes] Temperature of Extremity: [Left:Warm] [Right:Warm] Capillary Refill: [Left:< 3 seconds] [Right:< 3 seconds] Electronic Signature(s) Signed: 06/04/2017 5:26:58 PM By: Montey Hora Entered By: Montey Hora on 06/04/2017 09:55:22 [name] ([id number]) -------------------------------------------------------------------------------- Multi Wound Chart Details Patient Name: [name] Date of Service: 06/04/2017 9:15 AM Medical Record Number: [id number] Patient Account Number: [id number] Date of Birth/Sex: 1951/08/02 (66 y.o. Male) Treating RN: Montey Hora Primary Care Drake Wuertz: PATIENT, NO Other Clinician: Referring Dailee Manalang: Vic Blackbird Treating Nello Corro/Extender: Ricard Dillon Weeks in  Treatment: 11 Vital Signs Height(in): 70 Pulse(bpm): 77 Weight(lbs): 180 Blood Pressure(mmHg): 114/69 Body Mass Index(BMI): 26 Temperature(F): 98.1 Respiratory Rate 18 (breaths/min): Photos: [1:No Photos] [2:No Photos] [N/A:N/A] Wound Location: [1:Left Lower Leg - Anterior] [2:Right Toe Second - Lateral] [N/A:N/A] Wounding Event: [1:Trauma] [2:Gradually Appeared] [N/A:N/A] Primary Etiology: [1:Trauma, Other] [2:Arterial Insufficiency Ulcer] [N/A:N/A] Comorbid History: [1:Arrhythmia, Hypertension, Gout] [2:Arrhythmia, Hypertension, Gout] [N/A:N/A] Date Acquired: [1:02/15/2017] [2:05/22/2017] [N/A:N/A] Weeks of Treatment: [1:11] [2:1] [N/A:N/A] Wound Status: [1:Open] [2:Open] [N/A:N/A] Measurements L x W x D [1:1x0.3x0.1] [2:0.5x0.5x0.1] [N/A:N/A] (cm) Area (cm) : [1:0.236] [2:0.196] [N/A:N/A] Volume (cm) : [1:0.024] [2:0.02] [N/A:N/A] % Reduction in Area: [1:59.90%] [2:-532.30%] [N/A:N/A] % Reduction in Volume: [1:79.70%] [2:-566.70%] [N/A:N/A] Classification: [1:Full Thickness With Exposed Support Structures] [2:Partial Thickness] [N/A:N/A] Exudate Amount: [1:Large] [2:Medium] [  N/A:N/A] Exudate Type: [1:Serosanguineous] [2:Serous] [N/A:N/A] Exudate Color: [1:red, brown] [2:amber] [N/A:N/A] Wound Margin: [1:Epibole] [2:Flat and Intact] [N/A:N/A] Granulation Amount: [1:Large (67-100%)] [2:Large (67-100%)] [N/A:N/A] Granulation Quality: [1:Red] [2:Pink] [N/A:N/A] Necrotic Amount: [1:Small (1-33%)] [2:Small (1-33%)] [N/A:N/A] Exposed Structures: [1:Fat Layer (Subcutaneous Tissue) Exposed: Yes Fascia: No Tendon: No Muscle: No Joint: No Bone: No] [2:Fascia: No Fat Layer (Subcutaneous Tissue) Exposed: No Tendon: No Muscle: No Joint: No Bone: No] [N/A:N/A] Epithelialization: [1:None] [2:None] [N/A:N/A] Debridement: [1:Debridement ([id number])] [2:N/A] [N/A:N/A] Pre-procedure [1:10:04] [2:N/A] [N/A:N/A] Verification/Time Out Taken: Pain Control: [1:Lidocaine 4% Topical Solution  N/A] [N/A:N/A] Tissue Debrided: [2:N/A] [N/A:N/A] Necrotic/Eschar, Fibrin/Slough, Subcutaneous Level: Skin/Subcutaneous Tissue N/A N/A Debridement Area (sq cm): 0.3 N/A N/A Instrument: Curette N/A N/A Bleeding: Minimum N/A N/A Hemostasis Achieved: Pressure N/A N/A Procedural Pain: 0 N/A N/A Post Procedural Pain: 0 N/A N/A Debridement Treatment Procedure was tolerated well N/A N/A Response: Post Debridement 1x0.2x0.2 N/A N/A Measurements L x W x D (cm) Post Debridement Volume: 0.031 N/A N/A (cm) Periwound Skin Texture: Excoriation: No Excoriation: No N/A Induration: No Induration: No Callus: No Callus: No Crepitus: No Crepitus: No Rash: No Rash: No Scarring: No Scarring: No Periwound Skin Moisture: Maceration: No Maceration: No N/A Dry/Scaly: No Dry/Scaly: No Periwound Skin Color: Erythema: Yes Erythema: Yes N/A Atrophie Blanche: No Atrophie Blanche: No Cyanosis: No Cyanosis: No Ecchymosis: No Ecchymosis: No Hemosiderin Staining: No Hemosiderin Staining: No Mottled: No Mottled: No Pallor: No Pallor: No Rubor: No Rubor: No Erythema Location: Circumferential Circumferential N/A Temperature: No Abnormality No Abnormality N/A Tenderness on Palpation: Yes Yes N/A Wound Preparation: Ulcer Cleansing: Ulcer Cleansing: N/A Rinsed/Irrigated with Saline Rinsed/Irrigated with Saline Topical Anesthetic Applied: Topical Anesthetic Applied: Other: lidocaine 4% None Procedures Performed: Debridement N/A N/A Treatment Notes Electronic Signature(s) Signed: 06/04/2017 4:56:05 PM By: Linton Ham MD Previous Signature: 06/04/2017 9:57:55 AM Version By: Montey Hora Entered By: Linton Ham on 06/04/2017 10:16:23 [name] ([id number]) -------------------------------------------------------------------------------- Multi-Disciplinary Care Plan Details Patient Name: [name] Date of Service: 06/04/2017 9:15 AM Medical Record Number:  [id number] Patient Account Number: [id number] Date of Birth/Sex: 06-10-1951 (66 y.o. Male) Treating RN: Montey Hora Primary Care Nikky Duba: PATIENT, NO Other Clinician: Referring Baruch Lewers: Vic Blackbird Treating Keyonda Bickle/Extender: Tito Dine in Treatment: 11 Active Inactive ` Orientation to the Wound Care Program Nursing Diagnoses: Knowledge deficit related to the wound healing center program Goals: Patient/caregiver will verbalize understanding of the [place] Program Date Initiated: 03/18/2017 Target Resolution Date: 05/24/2017 Goal Status: Active Interventions: Provide education on orientation to the wound center Notes: ` Wound/Skin Impairment Nursing Diagnoses: Impaired tissue integrity Goals: Ulcer/skin breakdown will have a volume reduction of 30% by week 4 Date Initiated: 03/18/2017 Target Resolution Date: 05/24/2017 Goal Status: Active Ulcer/skin breakdown will have a volume reduction of 50% by week 8 Date Initiated: 03/18/2017 Target Resolution Date: 05/24/2017 Goal Status: Active Ulcer/skin breakdown will have a volume reduction of 80% by week 12 Date Initiated: 03/18/2017 Target Resolution Date: 05/24/2017 Goal Status: Active Ulcer/skin breakdown will heal within 14 weeks Date Initiated: 03/18/2017 Target Resolution Date: 05/24/2017 Goal Status: Active Interventions: Assess patient/caregiver ability to obtain necessary supplies Assess patient/caregiver ability to perform ulcer/skin care regimen upon admission and as needed Assess ulceration(s) every visit Notes: ASMAR, BROZEK ([id number]) Electronic Signature(s) Signed: 06/04/2017 9:57:47 AM By: Montey Hora Entered By: Montey Hora on 06/04/2017 09:57:46 [name] ([id number]) -------------------------------------------------------------------------------- Pain Assessment Details Patient Name: [name] Date of Service: 06/04/2017 9:15 AM Medical Record Number:  [id number] Patient Account Number: [id number] Date of Birth/Sex: Nov 24, 1950 (66 y.o.  Male) Treating RN: Montey Hora Primary Care Isa Hitz: PATIENT, NO Other Clinician: Referring Jahmir Salo: Vic Blackbird Treating Hasten Sweitzer/Extender: Tito Dine in Treatment: 11 Active Problems Location of Pain Severity and Description of Pain Patient Has Paino Yes Site Locations Pain Location: Pain in Ulcers With Dressing Change: Yes Duration of the Pain. Constant / Intermittento Constant Pain Management and Medication Current Pain Management: Notes Topical or injectable lidocaine is offered to patient for acute pain when surgical debridement is performed. If needed, Patient is instructed to use over the counter pain medication for the following 24-48 hours after debridement. Wound care MDs do not prescribed pain medications. Patient has chronic pain or uncontrolled pain. Patient has been instructed to make an appointment with their Primary Care Physician for pain management. Electronic Signature(s) Signed: 06/04/2017 5:26:58 PM By: Montey Hora Entered By: Montey Hora on 06/04/2017 09:39:15 [name] ([id number]) -------------------------------------------------------------------------------- Patient/Caregiver Education Details Patient Name: [name] Date of Service: 06/04/2017 9:15 AM Medical Record Number: [id number] Patient Account Number: [id number] Date of Birth/Gender: 05/30/1951 (66 y.o. Male) Treating RN: Montey Hora Primary Care Physician: PATIENT, NO Other Clinician: Referring Physician: Vic Blackbird Treating Physician/Extender: Tito Dine in Treatment: 11 Education Assessment Education Provided To: Patient Education Topics Provided Wound/Skin Impairment: Handouts: Other: wound care as ordered Methods: Demonstration, Explain/Verbal Responses: State content correctly Electronic Signature(s) Signed: 06/04/2017 5:26:58 PM By: Montey Hora Entered By: Montey Hora on 06/04/2017 10:02:11 [name] ([id number]) -------------------------------------------------------------------------------- Wound Assessment Details Patient Name: [name] Date of Service: 06/04/2017 9:15 AM Medical Record Number: [id number] Patient Account Number: [id number] Date of Birth/Sex: 1951-06-28 (66 y.o. Male) Treating RN: Montey Hora Primary Care Adrik Khim: PATIENT, NO Other Clinician: Referring Aluna Whiston: Vic Blackbird Treating Azizi Bally/Extender: Tito Dine in Treatment: 11 Wound Status Wound Number: 1 Primary Etiology: Trauma, Other Wound Location: Left Lower Leg - Anterior Wound Status: Open Wounding Event: Trauma Comorbid History: Arrhythmia, Hypertension, Gout Date Acquired: 02/15/2017 Weeks Of Treatment: 11 Clustered Wound: No Photos Photo Uploaded By: Montey Hora on 06/04/2017 13:32:02 Wound Measurements Length: (cm) 1 Width: (cm) 0.3 Depth: (cm) 0.1 Area: (cm) 0.236 Volume: (cm) 0.024 % Reduction in Area: 59.9% % Reduction in Volume: 79.7% Epithelialization: None Tunneling: No Undermining: No Wound Description Full Thickness With Exposed Support Classification: Structures Wound Margin: Epibole Exudate Large Amount: Exudate Type: Serosanguineous Exudate Color: red, brown Foul Odor After Cleansing: No Slough/Fibrino Yes Wound Bed Granulation Amount: Large (67-100%) Exposed Structure Granulation Quality: Red Fascia Exposed: No Necrotic Amount: Small (1-33%) Fat Layer (Subcutaneous Tissue) Exposed: Yes Necrotic Quality: Adherent Slough Tendon Exposed: No Muscle Exposed: No Joint Exposed: No Bone Exposed: No Lamay, Raymundo ([id number]) Periwound Skin Texture Texture Color No Abnormalities Noted: No No Abnormalities Noted: No Callus: No Atrophie Blanche: No Crepitus: No Cyanosis: No Excoriation: No Ecchymosis: No Induration: No Erythema: Yes Rash: No Erythema  Location: Circumferential Scarring: No Hemosiderin Staining: No Mottled: No Moisture Pallor: No No Abnormalities Noted: No Rubor: No Dry / Scaly: No Maceration: No Temperature / Pain Temperature: No Abnormality Tenderness on Palpation: Yes Wound Preparation Ulcer Cleansing: Rinsed/Irrigated with Saline Topical Anesthetic Applied: Other: lidocaine 4%, Treatment Notes Wound #1 (Left, Anterior Lower Leg) 1. Cleansed with: Clean wound with Normal Saline 2. Anesthetic Topical Lidocaine 4% cream to wound bed prior to debridement 4. Dressing Applied: Prisma Ag 5. Secondary Dressing Applied [place] Notes coverlet to toe Electronic Signature(s) Signed: 06/04/2017 9:56:48 AM By: Montey Hora Entered By: Montey Hora on 06/04/2017 09:56:47 [name] ([id number]) -------------------------------------------------------------------------------- Wound Assessment  Details Patient Name: ARLANDO, LEISINGER Date of Service: 06/04/2017 9:15 AM Medical Record Number: [id number] Patient Account Number: [id number] Date of Birth/Sex: 24-Nov-1950 (66 y.o. Male) Treating RN: Montey Hora Primary Care Nevayah Faust: PATIENT, NO Other Clinician: Referring Dinita Migliaccio: Vic Blackbird Treating Calvert Charland/Extender: Ricard Dillon Weeks in Treatment: 11 Wound Status Wound Number: 2 Primary Etiology: Arterial Insufficiency Ulcer Wound Location: Right Toe Second - Lateral Wound Status: Open Wounding Event: Gradually Appeared Comorbid History: Arrhythmia, Hypertension, Gout Date Acquired: 05/22/2017 Weeks Of Treatment: 1 Clustered Wound: No Photos Photo Uploaded By: Montey Hora on 06/04/2017 13:32:20 Wound Measurements Length: (cm) 0.5 Width: (cm) 0.5 Depth: (cm) 0.1 Area: (cm) 0.196 Volume: (cm) 0.02 % Reduction in Area: -532.3% % Reduction in Volume: -566.7% Epithelialization: None Tunneling: No Undermining: No Wound Description Classification: Partial  Thickness Wound Margin: Flat and Intact Exudate Amount: Medium Exudate Type: Serous Exudate Color: amber Foul Odor After Cleansing: No Slough/Fibrino Yes Wound Bed Granulation Amount: Large (67-100%) Exposed Structure Granulation Quality: Pink Fascia Exposed: No Necrotic Amount: Small (1-33%) Fat Layer (Subcutaneous Tissue) Exposed: No Necrotic Quality: Adherent Slough Tendon Exposed: No Muscle Exposed: No Joint Exposed: No Bone Exposed: No Periwound Skin Texture Arterburn, Jaqwon ([id number]) Texture Color No Abnormalities Noted: No No Abnormalities Noted: No Callus: No Atrophie Blanche: No Crepitus: No Cyanosis: No Excoriation: No Ecchymosis: No Induration: No Erythema: Yes Rash: No Erythema Location: Circumferential Scarring: No Hemosiderin Staining: No Mottled: No Moisture Pallor: No No Abnormalities Noted: No Rubor: No Dry / Scaly: No Maceration: No Temperature / Pain Temperature: No Abnormality Tenderness on Palpation: Yes Wound Preparation Ulcer Cleansing: Rinsed/Irrigated with Saline Topical Anesthetic Applied: None Treatment Notes Wound #2 (Right, Lateral Toe Second) 1. Cleansed with: Clean wound with Normal Saline 2. Anesthetic Topical Lidocaine 4% cream to wound bed prior to debridement 4. Dressing Applied: Prisma Ag 5. Secondary Dressing Applied Dry [place] Notes coverlet to toe Electronic Signature(s) Signed: 06/04/2017 9:57:03 AM By: Montey Hora Entered By: Montey Hora on 06/04/2017 [place], Bertha ([id number]) -------------------------------------------------------------------------------- Vitals Details Patient Name: [name] Date of Service: 06/04/2017 9:15 AM Medical Record Number: [id number] Patient Account Number: [id number] Date of Birth/Sex: 20-Jan-1951 (66 y.o. Male) Treating RN: Montey Hora Primary Care Terryn Rosenkranz: PATIENT, NO Other Clinician: Referring Kateria Cutrona: Vic Blackbird Treating  Nani Ingram/Extender: Tito Dine in Treatment: 11 Vital Signs Time Taken: 09:39 Temperature (F): 98.1 Height (in): 70 Pulse (bpm): 73 Weight (lbs): 180 Respiratory Rate (breaths/min): 18 Body Mass Index (BMI): 25.8 Blood Pressure (mmHg): 114/69 Reference Range: 80 - 120 mg / dl Electronic Signature(s) Signed: 06/04/2017 5:26:58 PM By: Montey Hora Entered By: Montey Hora on 06/04/2017 09:41:18

## 2017-06-06 NOTE — Progress Notes (Signed)
Edward Marquez, Edward Marquez ([id number]) Visit Report for 06/04/2017 Debridement Details Patient Name: Edward Marquez, Edward Marquez Date of Service: 06/04/2017 9:15 AM Medical Record Number: [id number] Patient Account Number: [id number] Date of Birth/Sex: [birth year] (66 y.o. Male) Treating RN: Montey Hora Primary Care Provider: PATIENT, NO Other Clinician: Referring Provider: Vic Blackbird Treating Provider/Extender: Tito Dine in Treatment: 11 Debridement Performed for Wound #1 Left,Anterior Lower Leg Assessment: Performed By: Physician Ricard Dillon, MD Debridement: Debridement Pre-procedure Verification/Time Yes - 10:04 Out Taken: Start Time: 10:04 Pain Control: Lidocaine 4% Topical Solution Level: Skin/Subcutaneous Tissue Total Area Debrided (L x W): 1 (cm) x 0.3 (cm) = 0.3 (cm) Tissue and other material Viable, Non-Viable, Eschar, Fibrin/Slough, Subcutaneous debrided: Instrument: Curette Bleeding: Minimum Hemostasis Achieved: Pressure End Time: 10:06 Procedural Pain: 0 Post Procedural Pain: 0 Response to Treatment: Procedure was tolerated well Post Debridement Measurements of Total Wound Length: (cm) 1 Width: (cm) 0.2 Depth: (cm) 0.2 Volume: (cm) 0.031 Character of Wound/Ulcer Post Debridement: Improved Post Procedure Diagnosis Same as Pre-procedure Electronic Signature(s) Signed: 06/04/2017 4:56:05 PM By: Linton Ham MD Signed: 06/04/2017 5:26:58 PM By: Montey Hora Entered By: Linton Ham on 06/04/2017 10:16:44 Edward Marquez ([id number]) -------------------------------------------------------------------------------- HPI Details Patient Name: Edward Marquez Date of Service: 06/04/2017 9:15 AM Medical Record Number: [id number] Patient Account Number: [id number] Date of Birth/Sex: 03/03/1951 (66 y.o. Male) Treating RN: Montey Hora Primary Care Provider: PATIENT, NO Other Clinician: Referring Provider: Vic Blackbird Treating Provider/Extender:  Tito Dine in Treatment: 11 History of Present Illness HPI Description: 03/18/17 patient presents today for initial evaluation concerning an injury which occurred roughly 2 months ago when he was using a tiller at his home. He tells me that he was carrying this when it cut his left anterior shin. He did not go see anyone immediately as he felt that it would just heal on its own but obviously he tells me that's not the case. This ended up red and inflamed surrounding and he was diagnosed with cellulitis at the urgent care when he was seen on 02/19/17. He was started on Augmentin at that time and then had a follow-up wound care check on 03/11/17 at urgent care as well. Fortunately there is no evidence of infection at that point the Bactroban was prescribed for him at that time. He has been using that sense. Patient is unaware of the fact that he had issues with blood flow in his lower extremities bilaterally until all this happened and they began evaluating him at urgent care. Subsequently he has been scheduled for an arterial study with ABI and TBI on 03/15/17 and this will be scheduled for 04/10/17. 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 He states that he has discomfort about one out of 10 at rest described as a sharp/burning sensation that is worsened to four out of 10 with cleansing the wound. Other than those medical conditions patient appears to be in good health and remains physically active even at 66 years of age. 03/26/17 on evaluation today patient appears to be doing fairly well in regard to his left lower extremity wound. The overall appearance is greatly improved although his measurements really have not shifted at all. He does have an appointment for a vascular evaluation due  to his diminished blood flow in the bilateral lower extremities with  associated history of potential vascular claudication. This appointment is scheduled for April 10, 2017 in [place]. Otherwise patient has no significant pain and states that he asked he feels like his "neuropathy" is doing better. 04/02/17; traumatic wound on his left anterior leg about 2-1/2 months ago which has not really made any substantial progression towards healing. The patient has been using Santyl. He has had a venous ultrasound that was negative for DVT. He has arterial studies and apparently a vascular surgeon consultation booked for 04/10/17. The patient has a St. Jude's aortic valve and is on Coumadin. He complains of discomfort predominantly in the plantar aspect of his left foot and lateral 4 toes. This is worse at night and may actually reflect plantar fasciitis. But with ongoing questioning I'm quite convinced that he has claudication approximately 100 yards when walking to his barn from his house aching discomfort in both legs 04/09/17; vascular studies and apparently vascular consultation both booked for tomorrow. He continues to complain of a burning sensation in his left leg which I think may be claudication. We have been using Santyl I think since the beginning of his stay here. He will be traveling on vacation next week we will see him back in 2 weeks 04/23/17; Mr. Niday saw Dr. Doren Custard of vascular surgery. His arterial studies showed a right ABI of 1.01. Noncompressible on the left side. There was no flow on the left great toe and sick TBI could not be obtained. TBI in the right is quoted at 0.09. He is apparently going for a arteriogram on 05/01/17. He has been using Santyl to the wound. He is using Santyl to the wound 05/14/17; since the patient was last in clinic he was admitted to hospital from 9/18 through 9/27. He underwent angiography and ultimately on 9/21 had a left above-knee popliteal  to anterior tibial artery bypass with a saphenous vein graft. He is on chronic Coumadin secondary to periodic follow-up replacement therefore needed bridging with Lovenox and then return to Coumadin. He is doing well from a wound healing point of view but he still complains of edema and a discomfort in his foot at night when he elevates the leg. 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 I'm not clear exactly what he is using on the original left leg wound. As mentioned he is complaining of pain or some form of discomfort at night when his leg is elevated and having difficulty sleeping. He is not using the Percocet that was prescribed when he left the hospital 05/22/17; patient arrives today with his original wound on the left anterior leg looking much better. However roughly a week ago he tells me he developed skin damage on his bilateral toes. He has a new wound on the lateral part of the third toe at the level of the PIP. He states this is painful. We've been using silver collagen to the wound on the left anterior leg. He is status post revascularization 06/04/17; left anterior leg wound continues to look better post debridement. He still has an open area on the lateral aspect of the right second toe. The condition of his skin with antifungal treatment is a lot better. He tells me that the silver alginate [place], Edward Marquez ([id number]) seems to sting his wound when he puts it on I have reviewed the noninvasive studies he had on the right. This showed a normal ABI of 1.01  however he had a very limited TBI and monophasic waveforms. He has a follow-up with Dr. Doren Custard on November 7. Electronic Signature(s) Signed: 06/04/2017 4:56:05 PM By: Linton Ham MD Entered By: Linton Ham on 06/04/2017 10:18:35 Edward Marquez  ([id number]) -------------------------------------------------------------------------------- Physical Exam Details Patient Name: Edward Marquez Date of Service: 06/04/2017 9:15 AM Medical Record Number: [id number] Patient Account Number: [id number] Date of Birth/Sex: Feb 07, 1951 (66 y.o. Male) Treating RN: Montey Hora Primary Care Provider: PATIENT, NO Other Clinician: Referring Provider: Vic Blackbird Treating Provider/Extender: Ricard Dillon Weeks in Treatment: 11 Constitutional Sitting or standing Blood Pressure is within target range for patient.. Pulse regular and within target range for patient.Marland Kitchen Respirations regular, non-labored and within target range.. Temperature is normal and within the target range for the patient.Marland Kitchen appears in no distress. Notes When exam; the original oval-shaped wound in the left anterior leg continues to contract. Debrided with a #3 curet of necrotic surface debris and subcutaneous tissue. Minimal bleeding no evidence of surrounding infection. oHe has one open area on the right lateral second toe at the level of the PIP. This is very tender there is still some erythema of the second third toes but generally the erythema of the rest of his toes seems better with the topical antifungal cream be applied Electronic Signature(s) Signed: 06/04/2017 4:56:05 PM By: Linton Ham MD Entered By: Linton Ham on 06/04/2017 10:20:19 Edward Marquez ([id number]) -------------------------------------------------------------------------------- Physician Orders Details Patient Name: Edward Marquez Date of Service: 06/04/2017 9:15 AM Medical Record Number: [id number] Patient Account Number: [id number] Date of Birth/Sex: 1950-11-25 (66 y.o. Male) Treating RN: Montey Hora Primary Care Provider: PATIENT, NO Other Clinician: Referring Provider: Vic Blackbird Treating Provider/Extender: Tito Dine in Treatment: 11 Verbal / Phone Orders:  No Diagnosis Coding Wound Cleansing Wound #1 Left,Anterior Lower Leg o Clean wound with Normal Saline. Wound #2 Right,Lateral Toe Second o Clean wound with Normal Saline. Anesthetic Wound #1 Left,Anterior Lower Leg o Topical Lidocaine 4% cream applied to wound bed prior to debridement Wound #2 Right,Lateral Toe Second o Topical Lidocaine 4% cream applied to wound bed prior to debridement Skin Barriers/Peri-Wound Care Wound #1 Left,Anterior Lower Leg o Skin Prep Wound #2 Right,Lateral Toe Second o Skin Prep Primary Wound Dressing Wound #1 Left,Anterior Lower Leg o Promogran Wound #2 Right,Lateral Toe Second o Promogran Secondary Dressing Wound #1 Left,Anterior Lower Leg o Dry Gauze o Non-adherent pad - telfa island Wound #2 Right,Lateral Toe Second o Other - coverlet Dressing Change Frequency Wound #1 Left,Anterior Lower Leg o Change dressing every other day. Wound #2 Right,Lateral Toe Second o Change dressing every day. Edward Marquez, Edward Marquez ([id number]) Follow-up Appointments Wound #1 Left,Anterior Lower Leg o Return Appointment in 1 week. Wound #2 Right,Lateral Toe Second o Return Appointment in 1 week. Edema Control Wound #1 Left,Anterior Lower Leg o Elevate legs to the level of the heart and pump ankles as often as possible Wound #2 Right,Lateral Toe Second o Elevate legs to the level of the heart and pump ankles as often as possible Additional Orders / Instructions Wound #1 Left,Anterior Lower Leg o Increase protein intake. o Other: - Please add vitamin A, vitamin C and zinc supplements to your diet Wound #2 Right,Lateral Toe Second o Increase protein intake. o Other: - Please add vitamin A, vitamin C and zinc supplements to your diet Electronic Signature(s) Signed: 06/04/2017 4:56:05 PM By: Linton Ham MD Signed: 06/04/2017 5:26:58 PM By: Montey Hora Entered By: Montey Hora on 06/04/2017 10:08:19 Edward Marquez  ([id number]) -------------------------------------------------------------------------------- Problem List Details  Patient Name: JERRIN, RECORE Date of Service: 06/04/2017 9:15 AM Medical Record Number: [id number] Patient Account Number: [id number] Date of Birth/Sex: 1951/07/10 (66 y.o. 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 I48.2 Chronic atrial fibrillation 03/18/2017 Yes Z95.2 Presence of prosthetic heart valve 03/18/2017 Yes L97.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 Male) Treating RN: Montey Hora Primary Care Provider: PATIENT, NO Other Clinician: Referring Provider: Vic Blackbird Treating Provider/Extender: Ricard Dillon Weeks in Treatment: 11 Subjective History of Present Illness (HPI) 03/18/17 patient presents today for initial evaluation concerning an injury which occurred roughly 2 months ago when he was using a tiller at his home. He tells me that he was carrying this when it cut his  left anterior shin. He did not go see anyone immediately as he felt that it would just heal on its own but obviously he tells me that's not the case. This ended up red and inflamed surrounding and he was diagnosed with cellulitis at the urgent care when he was seen on 02/19/17. He was started on Augmentin at that time and then had a follow-up wound care check on 03/11/17 at urgent care as well. Fortunately there is no evidence of infection at that point the Bactroban was prescribed for him at that time. He has been using that sense. Patient is unaware of the fact that he had issues with blood flow in his lower extremities bilaterally until all this happened and they began evaluating him at urgent care. Subsequently he has been scheduled for an arterial study with ABI and TBI on 03/15/17 and this will be scheduled for 04/10/17. 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 He states that he has discomfort about one out of 10 at rest described as a sharp/burning sensation that is worsened to four out of 10 with cleansing the wound. Other than those medical conditions patient appears to be in good health and remains physically active even at 66 years of age. 03/26/17 on evaluation today patient appears to be doing fairly well in regard to his left lower extremity wound. The overall appearance is greatly improved although his measurements really have not shifted at all. He does have an appointment for a vascular evaluation due to his diminished blood flow in the bilateral lower extremities with associated history of potential vascular claudication. This appointment is scheduled for April 10, 2017 in [place]. Otherwise patient has no significant pain and states that he asked he feels like his  "neuropathy" is doing better. 04/02/17; traumatic wound on his left anterior leg about 2-1/2 months ago which has  not really made any substantial progression towards healing. The patient has been using Santyl. He has had a venous ultrasound that was negative for DVT. He has arterial studies and apparently a vascular surgeon consultation booked for 04/10/17. The patient has a St. Jude's aortic valve and is on Coumadin. He complains of discomfort predominantly in the plantar aspect of his left foot and lateral 4 toes. This is worse at night and may actually reflect plantar fasciitis. But with ongoing questioning I'm quite convinced that he has claudication approximately 100 yards when walking to his barn from his house aching discomfort in both legs 04/09/17; vascular studies and apparently vascular consultation both booked for tomorrow. He continues to complain of a burning sensation in his left leg which I think may be claudication. We have been using Santyl I think since the beginning of his stay here. He will be traveling on vacation next week we will see him back in 2 weeks 04/23/17; Mr. Harland saw Dr. Doren Custard of vascular surgery. His arterial studies showed a right ABI of 1.01. Noncompressible on the left side. There was no flow on the left great toe and sick TBI could not be obtained. TBI in the right is quoted at 0.09. He is apparently going for a arteriogram on 05/01/17. He has been using Santyl to the wound. He is using Santyl to the wound 05/14/17; since the patient was last in clinic he was admitted to hospital from 9/18 through 9/27. He underwent angiography and ultimately on 9/21 had a left above-knee popliteal to anterior tibial artery bypass with a saphenous vein graft. He is on chronic Coumadin secondary to periodic follow-up replacement therefore needed bridging with Lovenox and then return to Coumadin. He is doing well from a wound healing point of view but he still complains of edema and a  discomfort in his foot at night when he elevates the leg. 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 I'm not clear exactly what he is using on the original left leg wound. As mentioned he is complaining of pain or some form of discomfort at night when his leg is elevated and having difficulty sleeping. He is not using the Percocet that was prescribed when he left the hospital 05/22/17; patient arrives today with his original wound on the left anterior leg looking much better. However roughly a week ago he tells me he developed skin damage on his bilateral toes. He has a new wound on the lateral part of the third toe at the level of the PIP. He states this is painful. We've been using silver collagen to the wound on the left anterior leg. He is status post revascularization 06/04/17; left anterior leg wound continues to look better post debridement. He still has an open area on the lateral aspect of Edward Marquez, Edward Marquez ([id number]) the right second toe. The condition of his skin with antifungal treatment is a lot better. He tells me that the silver alginate seems to sting his wound when he puts it on I have reviewed the noninvasive studies he had on the right. This showed a normal ABI of 1.01 however he had a very limited TBI and monophasic waveforms. He has a follow-up with Dr. Doren Custard on November 7. Objective Constitutional Sitting or standing Blood Pressure is within target range for patient.. Pulse regular and within target range for patient.Marland Kitchen Respirations regular, non-labored and within target range.. Temperature is normal  and within the target range for the patient.Marland Kitchen appears in no distress. Vitals Time Taken: 9:39 AM, Height: 70 in, Weight: 180 lbs, BMI: 25.8, Temperature: 98.1 F, Pulse: 73 bpm, Respiratory Rate: 18 breaths/min, Blood Pressure: 114/69 mmHg. General Notes: When exam; the  original oval-shaped wound in the left anterior leg continues to contract. Debrided with a #3 curet of necrotic surface debris and subcutaneous tissue. Minimal bleeding no evidence of surrounding infection. He has one open area on the right lateral second toe at the level of the PIP. This is very tender there is still some erythema of the second third toes but generally the erythema of the rest of his toes seems better with the topical antifungal cream be applied Integumentary (Hair, Skin) Wound #1 status is Open. Original cause of wound was Trauma. The wound is located on the Left,Anterior Lower Leg. The wound measures 1cm length x 0.3cm width x 0.1cm depth; 0.236cm^2 area and 0.024cm^3 volume. There is Fat Layer (Subcutaneous Tissue) Exposed exposed. There is no tunneling or undermining noted. There is a large amount of serosanguineous drainage noted. The wound margin is epibole. There is large (67-100%) red granulation within the wound bed. There is a small (1-33%) amount of necrotic tissue within the wound bed including Adherent Slough. The periwound skin appearance exhibited: Erythema. The periwound skin appearance did not exhibit: Callus, Crepitus, Excoriation, Induration, Rash, Scarring, Dry/Scaly, Maceration, Atrophie Blanche, Cyanosis, Ecchymosis, Hemosiderin Staining, Mottled, Pallor, Rubor. The surrounding wound skin color is noted with erythema which is circumferential. Periwound temperature was noted as No Abnormality. The periwound has tenderness on palpation. Wound #2 status is Open. Original cause of wound was Gradually Appeared. The wound is located on the Right,Lateral Toe Second. The wound measures 0.5cm length x 0.5cm width x 0.1cm depth; 0.196cm^2 area and 0.02cm^3 volume. There is no tunneling or undermining noted. There is a medium amount of serous drainage noted. The wound margin is flat and intact. There is large (67-100%) pink granulation within the wound bed. There is a  small (1-33%) amount of necrotic tissue within the wound bed including Adherent Slough. The periwound skin appearance exhibited: Erythema. The periwound skin appearance did not exhibit: Callus, Crepitus, Excoriation, Induration, Rash, Scarring, Dry/Scaly, Maceration, Atrophie Blanche, Cyanosis, Ecchymosis, Hemosiderin Staining, Mottled, Pallor, Rubor. The surrounding wound skin color is noted with erythema which is circumferential. Periwound temperature was noted as No Abnormality. The periwound has tenderness on palpation. 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 There was a Skin/Subcutaneous Tissue Debridement ([id number]) debridement with total area of 0.3 sq cm performed by Ricard Dillon, MD. with the following instrument(s): Curette to remove Viable and Non-Viable tissue/material including Fibrin/Slough, Eschar, and Subcutaneous after achieving pain control using Lidocaine 4% Topical Solution. A time out was conducted at 10:04, prior to the start of the procedure. A Minimum amount of bleeding was controlled with Pressure. The procedure was tolerated well with a pain level of 0 throughout and a pain level of 0 following the procedure. Post Debridement Measurements: 1cm length x 0.2cm width  x 0.2cm depth; 0.031cm^3 volume. Character of Wound/Ulcer Post Debridement is improved. Post procedure Diagnosis Wound #1: Same as Pre-Procedure Plan Wound Cleansing: Wound #1  Left,Anterior Lower Leg: Clean wound with Normal Saline. Wound #2 Right,Lateral Toe Second: Clean wound with Normal Saline. Anesthetic: Wound #1 Left,Anterior Lower Leg: Topical Lidocaine 4% cream applied to wound bed prior to debridement Wound #2 Right,Lateral Toe Second: Topical Lidocaine 4% cream applied to wound bed prior to debridement Skin Barriers/Peri-Wound Care: Wound #1 Left,Anterior Lower Leg: Skin Prep Wound #2 Right,Lateral Toe Second: Skin Prep Primary Wound Dressing: Wound #1 Left,Anterior Lower Leg: Promogran Wound #2 Right,Lateral Toe Second: Promogran Secondary Dressing: Wound #1 Left,Anterior Lower Leg: Dry Gauze Non-adherent pad - telfa island Wound #2 Right,Lateral Toe SecondJuleen Marquez, Edward Marquez ([id number]) Other - coverlet Dressing Change Frequency: Wound #1 Left,Anterior Lower Leg: Change dressing every other day. Wound #2 Right,Lateral Toe Second: Change dressing every day. Follow-up Appointments: Wound #1 Left,Anterior Lower Leg: Return Appointment in 1 week. Wound #2 Right,Lateral Toe Second: Return Appointment in 1 week. Edema Control: Wound #1 Left,Anterior Lower Leg: Elevate legs to the level of the heart and pump ankles as often as possible Wound #2 Right,Lateral Toe Second: Elevate legs to the level of the heart and pump ankles as often as possible Additional Orders / Instructions: Wound #1 Left,Anterior Lower Leg: Increase protein intake. Other: - Please add vitamin A, vitamin C and zinc supplements to your diet Wound #2 Right,Lateral Toe Second: Increase protein intake. Other: - Please add vitamin A, vitamin C and zinc supplements to your diet #1 change the primary dressing to collagen to both wounds. Wondered whether the silver alginate was contributing to some of his discomfort #2 is difficult to get a sense of this man's pain in the right leg although I worry that some of this may be claudication. He is status post revascularization  on the left side and outside of numbness which I felt was probably nerve injury he is not having any claudication pain. This is better than previously. However now on the right he complains of pain had some periods of time even when he is lying in bed at night. Although he states he is now walking a mile a day and does not have to stop because of pain. #3 I would like to get Dr. Doren Custard to look at the right leg vascular supply at the follow-up on November 7 Electronic Signature(s) Signed: 06/04/2017 4:56:05 PM By: Linton Ham MD Entered By: Linton Ham on 06/04/2017 10:22:55 Edward Marquez ([id number]) -------------------------------------------------------------------------------- [facility] Details Patient Name: Edward Marquez Date of Service: 06/04/2017 Medical Record Number: [id number] Patient Account Number: [id number] Date of Birth/Sex: 12-15-1950 (66 y.o. 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 Facility Procedures CPT4 Code Description: [id number] 11042 - DEB SUBQ TISSUE 20 SQ CM/< ICD-10 Diagnosis Description S81.802A Unspecified open wound, left lower leg, initial encounter L97.822 Non-pressure chronic ulcer of other part of left lower leg wit Modifier: h fat layer expos Quantity: 1 ed Physician Procedures CPT4 Code Description: [id number] 11042 - WC PHYS SUBQ  TISS 20 SQ CM ICD-10 Diagnosis Description S81.802A  Unspecified open wound, left lower leg, initial encounter L97.822 Non-pressure chronic ulcer of other part of left lower leg wit Modifier: h fat layer expos Quantity: 1 ed Electronic Signature(s) Signed: 06/04/2017 4:56:05 PM By: Linton Ham MD Entered By: Linton Ham on 06/04/2017 10:23:48

## 2017-06-11 ENCOUNTER — Ambulatory Visit: Payer: Medicare Other | Admitting: Internal Medicine

## 2017-06-12 DIAGNOSIS — Z7901 Long term (current) use of anticoagulants: Secondary | ICD-10-CM | POA: Diagnosis not present

## 2017-06-12 DIAGNOSIS — R791 Abnormal coagulation profile: Secondary | ICD-10-CM | POA: Diagnosis not present

## 2017-06-12 DIAGNOSIS — I4891 Unspecified atrial fibrillation: Secondary | ICD-10-CM | POA: Diagnosis not present

## 2017-06-12 DIAGNOSIS — Z5181 Encounter for therapeutic drug level monitoring: Secondary | ICD-10-CM | POA: Diagnosis not present

## 2017-06-12 DIAGNOSIS — Z952 Presence of prosthetic heart valve: Secondary | ICD-10-CM | POA: Diagnosis not present

## 2017-06-13 ENCOUNTER — Encounter: Payer: Medicare Other | Attending: Surgery | Admitting: Surgery

## 2017-06-13 DIAGNOSIS — I739 Peripheral vascular disease, unspecified: Secondary | ICD-10-CM | POA: Insufficient documentation

## 2017-06-13 DIAGNOSIS — L539 Erythematous condition, unspecified: Secondary | ICD-10-CM | POA: Diagnosis not present

## 2017-06-13 DIAGNOSIS — I482 Chronic atrial fibrillation: Secondary | ICD-10-CM | POA: Diagnosis not present

## 2017-06-13 DIAGNOSIS — S81802A Unspecified open wound, left lower leg, initial encounter: Secondary | ICD-10-CM | POA: Diagnosis not present

## 2017-06-13 DIAGNOSIS — L97511 Non-pressure chronic ulcer of other part of right foot limited to breakdown of skin: Secondary | ICD-10-CM | POA: Diagnosis not present

## 2017-06-13 DIAGNOSIS — L97822 Non-pressure chronic ulcer of other part of left lower leg with fat layer exposed: Secondary | ICD-10-CM | POA: Insufficient documentation

## 2017-06-13 DIAGNOSIS — L03115 Cellulitis of right lower limb: Secondary | ICD-10-CM | POA: Diagnosis not present

## 2017-06-13 DIAGNOSIS — Z952 Presence of prosthetic heart valve: Secondary | ICD-10-CM | POA: Diagnosis not present

## 2017-06-13 DIAGNOSIS — X58XXXA Exposure to other specified factors, initial encounter: Secondary | ICD-10-CM | POA: Diagnosis not present

## 2017-06-13 DIAGNOSIS — M25571 Pain in right ankle and joints of right foot: Secondary | ICD-10-CM | POA: Diagnosis not present

## 2017-06-13 DIAGNOSIS — L97512 Non-pressure chronic ulcer of other part of right foot with fat layer exposed: Secondary | ICD-10-CM | POA: Insufficient documentation

## 2017-06-14 DIAGNOSIS — R791 Abnormal coagulation profile: Secondary | ICD-10-CM | POA: Diagnosis not present

## 2017-06-14 DIAGNOSIS — I4891 Unspecified atrial fibrillation: Secondary | ICD-10-CM | POA: Diagnosis not present

## 2017-06-14 DIAGNOSIS — Z952 Presence of prosthetic heart valve: Secondary | ICD-10-CM | POA: Diagnosis not present

## 2017-06-14 NOTE — Progress Notes (Addendum)
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 and 0.009cm^3 volume. There is no tunneling or undermining noted. There is a large amount of serous drainage noted. The wound margin is distinct with the outline attached to the wound base. There is small (1-33%) red granulation within the wound bed. There is a large (67-100%) amount of necrotic tissue within the wound bed including Adherent Slough. The periwound skin appearance exhibited: Maceration, Erythema. The surrounding wound skin color is noted with erythema which is circumferential. Periwound temperature was noted as No Abnormality. 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 Cleanse wound with mild soap and water May Shower, gently pat wound dry prior to applying new dressing. Wound #2 Right,Lateral Toe Second: Clean wound with Normal Saline. Cleanse wound with mild soap and water May Shower, gently pat wound dry prior to applying new dressing. Wound #3 Right,Medial Toe Third: Clean wound with Normal Saline. Cleanse wound with mild soap and water May Shower, gently pat wound dry prior to applying new dressing. Anesthetic: Wound #1 Left,Anterior Lower Leg: Topical Lidocaine 4% cream applied to wound bed prior to debridement Wound #2 Right,Lateral Toe Second: Topical Lidocaine 4% cream applied to wound bed prior to debridement Wound #3 Right,Medial Toe Third: Topical Lidocaine 4% cream applied to wound bed prior to debridement Primary Wound Dressing: Wound #1 Left,Anterior Lower Leg: Promogran Wound #2 Right,Lateral Toe Second: Medihoney gel Wound #3 Right,Medial Toe Third: Medihoney gel Secondary Dressing: Wound #1 Left,Anterior Lower Leg: Other - [place] Wound #2 Right,Lateral Toe Second: Dry Gauze Conform/Kerlix Wound #3 Right,Medial Toe Third: Dry Gauze Conform/Kerlix Dressing Change Frequency: Wound #1 Left,Anterior Lower Leg: Change dressing every other day. Wound #2 Right,Lateral Toe Second: Change dressing every day. Wound #3 Right,Medial Toe Third: Change dressing every day. Follow-up Appointments: Wound #1 Left,Anterior Lower Leg: Return Appointment in 1 week. Wound #2 Right,Lateral Toe Second: Return Appointment in 1 week. Wound #3 Right,Medial Toe Third: Return  Appointment in 1 week. Edema Control: Wound #1 Left,Anterior Lower Leg: Elevate legs to the level of the heart and pump ankles as often as possible Wound #2 Right,Lateral Toe Second: Elevate legs to the level of the heart and pump ankles  as often as possible Wound #3 Right,Medial Toe Third: Elevate legs to the level of the heart and pump ankles as often as possible Off-Loading: Open toe surgical shoe to: - right foot Additional Orders / Instructions: Wound #1 Left,Anterior Lower Leg: Increase protein intake. [place], [place] ([id number]) Wound #2 Right,Lateral Toe Second: Increase protein intake. Wound #3 Right,Medial Toe Third: Increase protein intake. Radiology ordered were: X-ray, foot - right The following medication(s) was prescribed: doxycycline hyclate oral 100 mg capsule capsule oral bid starting 06/13/2017 After review today, his original wound on the left lower anterior shin, will be treated with [employer] and a bordered foam. The new issue on his right forefoot and on his third and fourth toe shows mild cellulitis and I will err on the side of treating him with doxycycline 100 mg twice a day for 10 days. I have also recommended medihoney to be applied to these ulcers, and his toes to be separated so as to offload this area well and give him a healing sandal. He will also take an x-ray of this foot. I have urged him to keep his appointment with Dr. Scot Dock next Wednesday and see Dr. Dellia Nims as planned next week. Electronic Signature(s) Signed: 06/13/2017 4:32:26 PM By: Edward Fudge MD, FACS Previous Signature: 06/13/2017 9:00:16 AM Version By: Edward Fudge MD, FACS Entered By: Edward Marquez on 06/13/2017 16:32:26 Edward Marquez ([id number]) -------------------------------------------------------------------------------- SuperBill Details Patient Name: Edward Marquez Date of Service: 06/13/2017 Medical Record Number: [id number] Patient Account Number: [id number] Date of Birth/Sex: 1951/07/14 (66 y.o. 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 fat layer exposed Facility Procedures CPT4 Code: [id number] Description: 99214 - WOUND CARE VISIT-LEV 4 EST PT Modifier: Quantity: 1 Physician Procedures CPT4 Code Description: [id number] - WC PHYS LEVEL 3 - EST PT ICD-10 Diagnosis Description S81.802A Unspecified open wound, left lower leg, initial encounter I73.9 Peripheral vascular disease, unspecified L97.512 Non-pressure chronic ulcer of other  part of right foot with fa L97.822 Non-pressure chronic ulcer of other part of left lower leg wit Modifier: t layer exposed h fat layer expos Quantity: 1 ed Electronic Signature(s) Signed: 06/13/2017 4:32:38 PM By: Edward Fudge MD, FACS Previous Signature: 06/13/2017 9:40:27 AM Version By: Alric Quan Previous Signature: 06/13/2017 9:00:45 AM Version By: Edward Fudge MD, FACS Previous Signature: 06/13/2017 9:00:38 AM Version By: Edward Fudge MD, FACS Entered By: Edward Marquez on 06/13/2017 16:32:37

## 2017-06-15 NOTE — Progress Notes (Addendum)
EMBRY, HUSS ([id number]) Visit Report for 06/13/2017 Arrival Information Details Patient Name: Edward Marquez, Edward Marquez Date of Service: 06/13/2017 8:00 AM Medical Record Number: [id number] Patient Account Number: [id number] Date of Birth/Sex: [birth year] (66 y.o. Male) Treating RN: Ahmed Prima Primary Care Keidra Withers: PATIENT, NO Other Clinician: Referring Elleanna Melling: Vic Blackbird Treating Caymen Dubray/Extender: Frann Rider in Treatment: 12 Visit Information History Since Last Visit All ordered tests and consults were completed: No Patient Arrived: Ambulatory Added or deleted any medications: No Arrival Time: 08:06 Any new allergies or adverse reactions: No Accompanied By: self Had a fall or experienced change in No Transfer Assistance: None activities of daily living that may affect Patient Identification Verified: Yes risk of falls: Secondary Verification Process Yes Signs or symptoms of abuse/neglect since last visito No Completed: Hospitalized since last visit: No Patient Requires Transmission-Based No Has Dressing in Place as Prescribed: Yes Precautions: Pain Present Now: No Patient Has Alerts: Yes Patient Alerts: Patient on Blood Thinner ABI [place] BILATERAL >220 Coumadin Electronic Signature(s) Signed: 06/13/2017 4:53:27 PM By: Alric Quan Entered By: Alric Quan on 06/13/2017 08:07:22 Edward Marquez ([id number]) -------------------------------------------------------------------------------- Clinic Level of Care Assessment Details Patient Name: Edward Marquez Date of Service: 06/13/2017 8:00 AM Medical Record Number: [id number] Patient Account Number: [id number] Date of Birth/Sex: [birth year] (66 y.o. Male) Treating RN: Ahmed Prima Primary Care Sophea Rackham: PATIENT, NO Other Clinician: Referring Velma Agnes: Vic Blackbird Treating Anala Whisenant/Extender: Frann Rider in Treatment: 12 Clinic Level of Care Assessment Items TOOL 4 Quantity Score X - Use when  only an EandM is performed on FOLLOW-UP visit 1 0 ASSESSMENTS - Nursing Assessment / Reassessment X - Reassessment of Co-morbidities (includes updates in patient status) 1 10 X- 1 5 Reassessment of Adherence to Treatment Plan ASSESSMENTS - Wound and Skin Assessment / Reassessment []  - Simple Wound Assessment / Reassessment - one wound 0 X- 3 5 Complex Wound Assessment / Reassessment - multiple wounds []  - 0 Dermatologic / Skin Assessment (not related to wound area) ASSESSMENTS - Focused Assessment []  - Circumferential Edema Measurements - multi extremities 0 []  - 0 Nutritional Assessment / Counseling / Intervention []  - 0 Lower Extremity Assessment (monofilament, tuning fork, pulses) []  - 0 Peripheral Arterial Disease Assessment (using hand held doppler) ASSESSMENTS - Ostomy and/or Continence Assessment and Care []  - Incontinence Assessment and Management 0 []  - 0 Ostomy Care Assessment and Management (repouching, etc.) PROCESS - Coordination of Care []  - Simple Patient / Family Education for ongoing care 0 X- 1 20 Complex (extensive) Patient / Family Education for ongoing care X- 1 10 Staff obtains Programmer, systems, Records, Test Results / Process Orders []  - 0 Staff telephones HHA, Nursing Homes / Clarify orders / etc []  - 0 Routine Transfer to another Facility (non-emergent condition) []  - 0 Routine Hospital Admission (non-emergent condition) []  - 0 New Admissions / Biomedical engineer / Ordering NPWT, Apligraf, etc. []  - 0 Emergency Hospital Admission (emergent condition) X- 1 10 Simple Discharge Coordination GERAN, [place] ([id number]) []  - 0 Complex (extensive) Discharge Coordination PROCESS - Special Needs []  - Pediatric / Minor Patient Management 0 []  - 0 Isolation Patient Management []  - 0 Hearing / Language / Visual special needs []  - 0 Assessment of Community assistance (transportation, D/C planning, etc.) []  - 0 Additional assistance / Altered  mentation []  - 0 Support Surface(s) Assessment (bed, cushion, seat, etc.) INTERVENTIONS - Wound Cleansing / Measurement []  - Simple Wound Cleansing - one wound 0 X- 3 5 Complex Wound Cleansing - multiple wounds X- 1 5 Wound Imaging (  photographs - any number of wounds) []  - 0 Wound Tracing (instead of photographs) []  - 0 Simple Wound Measurement - one wound X- 3 5 Complex Wound Measurement - multiple wounds INTERVENTIONS - Wound Dressings X - Small Wound Dressing one or multiple wounds 2 10 []  - 0 Medium Wound Dressing one or multiple wounds []  - 0 Large Wound Dressing one or multiple wounds X- 1 5 Application of Medications - topical []  - 0 Application of Medications - injection INTERVENTIONS - Miscellaneous []  - External ear exam 0 []  - 0 Specimen Collection (cultures, biopsies, blood, body fluids, etc.) []  - 0 Specimen(s) / Culture(s) sent or taken to Lab for analysis []  - 0 Patient Transfer (multiple staff / Civil Service fast streamer / Similar devices) []  - 0 Simple Staple / Suture removal (25 or less) []  - 0 Complex Staple / Suture removal (26 or more) []  - 0 Hypo / Hyperglycemic Management (close monitor of Blood Glucose) []  - 0 Ankle / Brachial Index (ABI) - do not check if billed separately X- 1 5 Vital Signs Leavey, Jonmarc ([id number]) Has the patient been seen at the hospital within the last three years: Yes Total Score: 135 Level Of Care: New/Established - Level 4 Electronic Signature(s) Signed: 06/13/2017 4:53:27 PM By: Alric Quan Entered By: Alric Quan on 06/13/2017 09:40:07 Edward Marquez ([id number]) -------------------------------------------------------------------------------- Encounter Discharge Information Details Patient Name: Edward Marquez Date of Service: 06/13/2017 8:00 AM Medical Record Number: [id number] Patient Account Number: [id number] Date of Birth/Sex: 1951-03-09 (66 y.o. Male) Treating RN: Ahmed Prima Primary Care Joselynne Killam: PATIENT,  NO Other Clinician: Referring Sidney Silberman: Vic Blackbird Treating Stephens Shreve/Extender: Frann Rider in Treatment: 12 Encounter Discharge Information Items Discharge Pain Level: 0 Discharge Condition: Stable Ambulatory Status: Ambulatory Discharge Destination: Home Transportation: Private Auto Accompanied By: self Schedule Follow-up Appointment: Yes Medication Reconciliation completed and No provided to Patient/Care Zyanna Leisinger: Patient Clinical Summary of Care: Declined Electronic Signature(s) Signed: 06/13/2017 9:39:17 AM By: Alric Quan Previous Signature: 06/13/2017 8:27:58 AM Version By: Alric Quan Entered By: Alric Quan on 06/13/2017 09:39:17 Edward Marquez ([id number]) -------------------------------------------------------------------------------- Lower Extremity Assessment Details Patient Name: Edward Marquez Date of Service: 06/13/2017 8:00 AM Medical Record Number: [id number] Patient Account Number: [id number] Date of Birth/Sex: Jul 18, 1951 (66 y.o. Male) Treating RN: Ahmed Prima Primary Care Ezekiah Massie: PATIENT, NO Other Clinician: Referring Kalya Troeger: Vic Blackbird Treating Violett Hobbs/Extender: Frann Rider in Treatment: 12 Vascular Assessment Pulses: Dorsalis Pedis Palpable: [Left:Yes] [Right:No] Doppler Audible: [Right:Inaudible] Posterior Tibial Extremity colors, hair growth, and conditions: Extremity Color: [Left:Hyperpigmented] [Right:Hyperpigmented] Temperature of Extremity: [Left:Warm] [Right:Warm] Capillary Refill: [Left:< 3 seconds] [Right:< 3 seconds] Toe Nail Assessment Left: Right: Thick: No No Discolored: No No Deformed: No No Improper Length and Hygiene: No No Electronic Signature(s) Signed: 06/13/2017 4:53:27 PM By: Alric Quan Entered By: Alric Quan on 06/13/2017 08:21:07 Edward Marquez ([id number]) -------------------------------------------------------------------------------- Multi Wound Chart  Details Patient Name: Edward Marquez Date of Service: 06/13/2017 8:00 AM Medical Record Number: [id number] Patient Account Number: [id number] Date of Birth/Sex: 1951-04-23 (66 y.o. Male) Treating RN: Ahmed Prima Primary Care Cressida Milford: PATIENT, NO Other Clinician: Referring Keiry Kowal: Vic Blackbird Treating Anisa Leanos/Extender: Frann Rider in Treatment: 12 Vital Signs Height(in): 70 Pulse(bpm): 93 Weight(lbs): 180 Blood Pressure(mmHg): 123/83 Body Mass Index(BMI): 26 Temperature(F): 98.1 Respiratory Rate 18 (breaths/min): Photos: [1:No Photos] [2:No Photos] [3:No Photos] Wound Location: [1:Left Lower Leg - Anterior] [2:Right Toe Second - Lateral] [3:Right Toe Third - Medial] Wounding Event: [1:Trauma] [2:Gradually Appeared] [3:Gradually Appeared] Primary Etiology: [1:Trauma, Other] [2:Arterial Insufficiency Ulcer] [3:To be determined] Comorbid History: [1:Arrhythmia,  Hypertension, Gout] [2:Arrhythmia, Hypertension, Gout] [3:Arrhythmia, Hypertension, Gout] Date Acquired: [1:02/15/2017] [2:05/22/2017] [3:05/23/2017] Weeks of Treatment: [1:12] [2:3] [3:0] Wound Status: [1:Open] [2:Open] [3:Open] Measurements L x W x D [1:0.8x0.3x0.2] [2:0.6x0.6x0.2] [3:0.4x0.3x0.1] (cm) Area (cm) : [1:0.188] [2:0.283] [3:0.094] Volume (cm) : [1:0.038] [2:0.057] [3:0.009] % Reduction in Area: [1:68.10%] [2:-812.90%] [3:N/A] % Reduction in Volume: [1:67.80%] [2:-1800.00%] [3:N/A] Classification: [1:Full Thickness With Exposed Support Structures] [2:Partial Thickness] [3:Partial Thickness] Exudate Amount: [1:Large] [2:Large] [3:Large] Exudate Type: [1:Serosanguineous] [2:Serous] [3:Serous] Exudate Color: [1:red, brown] [2:amber] [3:amber] Wound Margin: [1:Epibole] [2:Flat and Intact] [3:Distinct, outline attached] Granulation Amount: [1:Large (67-100%)] [2:None Present (0%)] [3:Small (1-33%)] Granulation Quality: [1:Red] [2:N/A] [3:Red] Necrotic Amount: [1:Small (1-33%)] [2:Large  (67-100%)] [3:Large (67-100%)] Exposed Structures: [1:Fat Layer (Subcutaneous Tissue) Exposed: Yes Fascia: No Tendon: No Muscle: No Joint: No Bone: No] [2:Fascia: No Fat Layer (Subcutaneous Tissue) Exposed: No Tendon: No Muscle: No Joint: No Bone: No] [3:Fascia: No Fat Layer (Subcutaneous Tissue)  Exposed: No Tendon: No Muscle: No Joint: No Bone: No] Epithelialization: [1:Medium (34-66%)] [2:None] [3:Medium (34-66%)] Periwound Skin Texture: [1:Excoriation: No Induration: No Callus: No Crepitus: No] [2:Excoriation: No Induration: No Callus: No Crepitus: No] [3:No Abnormalities Noted] Rash: No Rash: No Scarring: No Scarring: No Periwound Skin Moisture: Maceration: No Maceration: Yes Maceration: Yes Dry/Scaly: No Dry/Scaly: No Periwound Skin Color: Erythema: Yes Erythema: Yes Erythema: Yes Atrophie Blanche: No Atrophie Blanche: No Cyanosis: No Cyanosis: No Ecchymosis: No Ecchymosis: No Hemosiderin Staining: No Hemosiderin Staining: No Mottled: No Mottled: No Pallor: No Pallor: No Rubor: No Rubor: No Erythema Location: Circumferential Circumferential Circumferential Temperature: No Abnormality No Abnormality No Abnormality Tenderness on Palpation: Yes Yes No Wound Preparation: Ulcer Cleansing: Ulcer Cleansing: Ulcer Cleansing: Rinsed/Irrigated with Saline Rinsed/Irrigated with Saline Rinsed/Irrigated with Saline Topical Anesthetic Applied: Topical Anesthetic Applied: Topical Anesthetic Applied: Other: lidocaine 4% Other: lidocaine 4% Other: lidocaine 4% Treatment Notes Electronic Signature(s) Signed: 06/13/2017 8:54:33 AM By: Christin Fudge MD, FACS Entered By: Christin Fudge on 06/13/2017 08:54:33 Edward Marquez ([id number]) -------------------------------------------------------------------------------- [place] Details Patient Name: Edward Marquez Date of Service: 06/13/2017 8:00 AM Medical Record Number: [id number] Patient Account Number:  [id number] Date of Birth/Sex: August 23, 1950 (66 y.o. Male) Treating RN: Ahmed Prima Primary Care Destyn Parfitt: PATIENT, NO Other Clinician: Referring Milessa Hogan: Vic Blackbird Treating Loletha Bertini/Extender: Frann Rider in Treatment: 12 Active Inactive Electronic Signature(s) Signed: 07/02/2017 2:43:13 PM By: Gretta Cool, BSN, RN, CWS, Kim RN, BSN Signed: 07/02/2017 4:46:45 PM By: Alric Quan Previous Signature: 06/13/2017 4:53:27 PM Version By: Alric Quan Entered By: Gretta Cool BSN, RN, CWS, Kim on 07/02/2017 14:43:12 Edward Marquez ([id number]) -------------------------------------------------------------------------------- Pain Assessment Details Patient Name: Edward Marquez Date of Service: 06/13/2017 8:00 AM Medical Record Number: [id number] Patient Account Number: [id number] Date of Birth/Sex: [birth year] (66 y.o. Male) Treating RN: Ahmed Prima Primary Care Jeannene Tschetter: PATIENT, NO Other Clinician: Referring Cambre Matson: Vic Blackbird Treating Ciclaly Mulcahey/Extender: Frann Rider in Treatment: 12 Active Problems Location of Pain Severity and Description of Pain Patient Has Paino No Site Locations Pain Management and Medication Current Pain Management: Electronic Signature(s) Signed: 06/13/2017 4:53:27 PM By: Alric Quan Entered By: Alric Quan on 06/13/2017 08:07:28 Edward Marquez ([id number]) -------------------------------------------------------------------------------- Patient/Caregiver Education Details Patient Name: Edward Marquez Date of Service: 06/13/2017 8:00 AM Medical Record Number: [id number] Patient Account Number: [id number] Date of Birth/Gender: 02-Jan-1951 (66 y.o. Male) Treating RN: Ahmed Prima Primary Care Physician: PATIENT, NO Other Clinician: Referring Physician: Vic Blackbird Treating Physician/Extender: Frann Rider in Treatment: 12 Education Assessment Education Provided To: Patient Education Topics Provided Wound/Skin  Impairment: Handouts: Other: change dressing as ordered Methods:  Demonstration, Explain/Verbal Responses: State content correctly Electronic Signature(s) Signed: 06/13/2017 4:53:27 PM By: Alric Quan Entered By: Alric Quan on 06/13/2017 08:28:15 Edward Marquez ([id number]) -------------------------------------------------------------------------------- Wound Assessment Details Patient Name: Edward Marquez Date of Service: 06/13/2017 8:00 AM Medical Record Number: [id number] Patient Account Number: [id number] Date of Birth/Sex: 1951-07-21 (66 y.o. Male) Treating RN: Ahmed Prima Primary Care Brandolyn Shortridge: PATIENT, NO Other Clinician: Referring Hazeline Charnley: Vic Blackbird Treating Maame Dack/Extender: Frann Rider in Treatment: 12 Wound Status Wound Number: 1 Primary Etiology: Trauma, Other Wound Location: Left Lower Leg - Anterior Wound Status: Open Wounding Event: Trauma Comorbid History: Arrhythmia, Hypertension, Gout Date Acquired: 02/15/2017 Weeks Of Treatment: 12 Clustered Wound: No Photos Photo Uploaded By: Alric Quan on 06/13/2017 14:29:26 Wound Measurements Length: (cm) 0.8 Width: (cm) 0.3 Depth: (cm) 0.2 Area: (cm) 0.188 Volume: (cm) 0.038 % Reduction in Area: 68.1% % Reduction in Volume: 67.8% Epithelialization: Medium (34-66%) Tunneling: No Undermining: No Wound Description Full Thickness With Exposed Support Classification: Structures Wound Margin: Epibole Exudate Large Amount: Exudate Type: Serosanguineous Exudate Color: red, brown Foul Odor After Cleansing: No Slough/Fibrino Yes Wound Bed Granulation Amount: Large (67-100%) Exposed Structure Granulation Quality: Red Fascia Exposed: No Necrotic Amount: Small (1-33%) Fat Layer (Subcutaneous Tissue) Exposed: Yes Necrotic Quality: Adherent Slough Tendon Exposed: No Muscle Exposed: No Joint Exposed: No Bone Exposed: No Nee, Lemar ([id number]) Periwound Skin Texture Texture  Color No Abnormalities Noted: No No Abnormalities Noted: No Callus: No Atrophie Blanche: No Crepitus: No Cyanosis: No Excoriation: No Ecchymosis: No Induration: No Erythema: Yes Rash: No Erythema Location: Circumferential Scarring: No Hemosiderin Staining: No Mottled: No Moisture Pallor: No No Abnormalities Noted: No Rubor: No Dry / Scaly: No Maceration: No Temperature / Pain Temperature: No Abnormality Tenderness on Palpation: Yes Wound Preparation Ulcer Cleansing: Rinsed/Irrigated with Saline Topical Anesthetic Applied: Other: lidocaine 4%, Electronic Signature(s) Signed: 06/13/2017 4:53:27 PM By: Alric Quan Entered By: Alric Quan on 06/13/2017 08:19:34 Edward Marquez ([id number]) -------------------------------------------------------------------------------- Wound Assessment Details Patient Name: Edward Marquez Date of Service: 06/13/2017 8:00 AM Medical Record Number: [id number] Patient Account Number: [id number] Date of Birth/Sex: [birth year] (66 y.o. Male) Treating RN: Ahmed Prima Primary Care Denette Hass: PATIENT, NO Other Clinician: Referring Berlin Viereck: Vic Blackbird Treating Cameo Shewell/Extender: Frann Rider in Treatment: 12 Wound Status Wound Number: 2 Primary Etiology: Arterial Insufficiency Ulcer Wound Location: Right Toe Second - Lateral Wound Status: Open Wounding Event: Gradually Appeared Comorbid History: Arrhythmia, Hypertension, Gout Date Acquired: 05/22/2017 Weeks Of Treatment: 3 Clustered Wound: No Photos Photo Uploaded By: Alric Quan on 06/13/2017 14:28:40 Wound Measurements Length: (cm) 0.6 Width: (cm) 0.6 Depth: (cm) 0.2 Area: (cm) 0.283 Volume: (cm) 0.057 % Reduction in Area: -812.9% % Reduction in Volume: -1800% Epithelialization: None Tunneling: No Undermining: No Wound Description Classification: Partial Thickness Wound Margin: Flat and Intact Exudate Amount: Large Exudate Type: Serous Exudate  Color: amber Foul Odor After Cleansing: No Slough/Fibrino Yes Wound Bed Granulation Amount: None Present (0%) Exposed Structure Necrotic Amount: Large (67-100%) Fascia Exposed: No Necrotic Quality: Adherent Slough Fat Layer (Subcutaneous Tissue) Exposed: No Tendon Exposed: No Muscle Exposed: No Joint Exposed: No Bone Exposed: No Periwound Skin Texture L[place], Kaimani ([id number]) Texture Color No Abnormalities Noted: No No Abnormalities Noted: No Callus: No Atrophie Blanche: No Crepitus: No Cyanosis: No Excoriation: No Ecchymosis: No Induration: No Erythema: Yes Rash: No Erythema Location: Circumferential Scarring: No Hemosiderin Staining: No Mottled: No Moisture Pallor: No No Abnormalities Noted: No Rubor: No Dry / Scaly: No Maceration: Yes Temperature / Pain Temperature: No Abnormality Tenderness on Palpation: Yes Wound Preparation Ulcer  Cleansing: Rinsed/Irrigated with Saline Topical Anesthetic Applied: Other: lidocaine 4%, Electronic Signature(s) Signed: 06/13/2017 4:53:27 PM By: Alric Quan Entered By: Alric Quan on 06/13/2017 08:16:55 Edward Marquez ([id number]) -------------------------------------------------------------------------------- Wound Assessment Details Patient Name: Edward Marquez Date of Service: 06/13/2017 8:00 AM Medical Record Number: [id number] Patient Account Number: [id number] Date of Birth/Sex: 11/11/1950 (66 y.o. Male) Treating RN: Ahmed Prima Primary Care Timonthy Hovater: PATIENT, NO Other Clinician: Referring Laurajean Hosek: Vic Blackbird Treating Zaccheaus Storlie/Extender: Frann Rider in Treatment: 12 Wound Status Wound Number: 3 Primary Etiology: To be determined Wound Location: Right Toe Third - Medial Wound Status: Open Wounding Event: Gradually Appeared Comorbid History: Arrhythmia, Hypertension, Gout Date Acquired: 05/23/2017 Weeks Of Treatment: 0 Clustered Wound: No Photos Photo Uploaded By: Alric Quan on  06/13/2017 14:28:41 Wound Measurements Length: (cm) 0.4 Width: (cm) 0.3 Depth: (cm) 0.1 Area: (cm) 0.094 Volume: (cm) 0.009 % Reduction in Area: % Reduction in Volume: Epithelialization: Medium (34-66%) Tunneling: No Undermining: No Wound Description Classification: Partial Thickness Wound Margin: Distinct, outline attached Exudate Amount: Large Exudate Type: Serous Exudate Color: amber Foul Odor After Cleansing: No Slough/Fibrino Yes Wound Bed Granulation Amount: Small (1-33%) Exposed Structure Granulation Quality: Red Fascia Exposed: No Necrotic Amount: Large (67-100%) Fat Layer (Subcutaneous Tissue) Exposed: No Necrotic Quality: Adherent Slough Tendon Exposed: No Muscle Exposed: No Joint Exposed: No Bone Exposed: No Periwound Skin Texture Souders, Eliga ([id number]) Texture Color No Abnormalities Noted: No No Abnormalities Noted: No Erythema: Yes Moisture Erythema Location: Circumferential No Abnormalities Noted: No Maceration: Yes Temperature / Pain Temperature: No Abnormality Wound Preparation Ulcer Cleansing: Rinsed/Irrigated with Saline Topical Anesthetic Applied: Other: lidocaine 4%, Electronic Signature(s) Signed: 06/13/2017 4:53:27 PM By: Alric Quan Entered By: Alric Quan on 06/13/2017 08:16:03 Edward Marquez ([id number]) -------------------------------------------------------------------------------- [place] Details Patient Name: Edward Marquez Date of Service: 06/13/2017 8:00 AM Medical Record Number: [id number] Patient Account Number: [id number] Date of Birth/Sex: [birth year] (66 y.o. Male) Treating RN: Ahmed Prima Primary Care Ziyonna Christner: PATIENT, NO Other Clinician: Referring Johanthan Kneeland: Vic Blackbird Treating Shellsea Borunda/Extender: Frann Rider in Treatment: 12 Vital Signs Time Taken: 08:08 Temperature (F): 98.1 Height (in): 70 Pulse (bpm): 93 Weight (lbs): 180 Respiratory Rate (breaths/min): 18 Body Mass Index (BMI):  25.8 Blood Pressure (mmHg): 123/83 Reference Range: 80 - 120 mg / dl Electronic Signature(s) Signed: 06/13/2017 4:53:27 PM By: Alric Quan Entered By: Alric Quan on 06/13/2017 08:11:38

## 2017-06-17 ENCOUNTER — Ambulatory Visit
Admission: RE | Admit: 2017-06-17 | Discharge: 2017-06-17 | Disposition: A | Discharge status: Home / Self Care | Payer: Medicare Other | Source: Ambulatory Visit | Attending: Surgery | Admitting: Surgery

## 2017-06-17 ENCOUNTER — Other Ambulatory Visit: Payer: Self-pay | Admitting: Surgery

## 2017-06-17 DIAGNOSIS — X58XXXA Exposure to other specified factors, initial encounter: Secondary | ICD-10-CM | POA: Diagnosis not present

## 2017-06-17 DIAGNOSIS — S81801A Unspecified open wound, right lower leg, initial encounter: Secondary | ICD-10-CM

## 2017-06-17 DIAGNOSIS — S91104A Unspecified open wound of right lesser toe(s) without damage to nail, initial encounter: Secondary | ICD-10-CM | POA: Diagnosis not present

## 2017-06-17 DIAGNOSIS — M7731 Calcaneal spur, right foot: Secondary | ICD-10-CM | POA: Insufficient documentation

## 2017-06-17 DIAGNOSIS — M2011 Hallux valgus (acquired), right foot: Secondary | ICD-10-CM | POA: Diagnosis not present

## 2017-06-17 DIAGNOSIS — L97511 Non-pressure chronic ulcer of other part of right foot limited to breakdown of skin: Secondary | ICD-10-CM | POA: Diagnosis not present

## 2017-06-19 ENCOUNTER — Other Ambulatory Visit: Payer: Self-pay | Admitting: *Deleted

## 2017-06-19 ENCOUNTER — Encounter: Payer: Self-pay | Admitting: Vascular Surgery

## 2017-06-19 ENCOUNTER — Encounter: Payer: Self-pay | Admitting: *Deleted

## 2017-06-19 ENCOUNTER — Ambulatory Visit (INDEPENDENT_AMBULATORY_CARE_PROVIDER_SITE_OTHER): Payer: Medicare Other | Admitting: Vascular Surgery

## 2017-06-19 VITALS — BP 115/75 | HR 95 | Temp 98.6°F | Resp 20 | Ht 69.0 in | Wt 182.3 lb

## 2017-06-19 DIAGNOSIS — I70299 Other atherosclerosis of native arteries of extremities, unspecified extremity: Secondary | ICD-10-CM

## 2017-06-19 DIAGNOSIS — L97909 Non-pressure chronic ulcer of unspecified part of unspecified lower leg with unspecified severity: Secondary | ICD-10-CM

## 2017-06-19 NOTE — Progress Notes (Signed)
Patient name: Edward Marquez MRN: [id number] DOB: April 17, 1951 Sex: male  REASON FOR VISIT:   Follow-up after bypass.  HPI:   Edward Marquez is a pleasant 66 y.o. 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  He presented with a nonhealing wound along the anterior lateral left leg.  He has a history of aortic valve replacement and is on chronic Coumadin therapy.  He was admitted with cellulitis and treated with intravenous antibiotics.  His Coumadin was held.  On 05/03/2017, he underwent a left above-knee popliteal to anterior tibial artery bypass with a vein graft.  We kept him in the hospital until his Coumadin was therapeutic.  He returns for his first outpatient visit.  Since I saw him last, he is developed cellulitis on his right foot and a wound between his second and third toes.  He denies fever.  He has been seen in the wound care center and has been on doxycycline.  He is on Coumadin  he does have a history of an aortic valve replacement.  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  His surgery was done at [facility].  He has had some swelling in the left leg after his bypass.  Current Outpatient Medications  Medication Sig Dispense Refill  . diltiazem (CARDIZEM CD) 120 MG 24 hr capsule Take 120 mg by mouth daily.    Marland Kitchen dofetilide (TIKOSYN) 500 MCG capsule Take 500 mcg by mouth 2 (two) times daily.     Marland Kitchen enoxaparin (LOVENOX) 80 MG/0.8ML injection Inject 0.8 mLs (80 mg total) into the skin every 12 (twelve) hours. 10 Syringe 1  . metoprolol succinate (TOPROL-XL) 100 MG 24 hr tablet Take 100 mg by mouth daily. Take with or immediately following a meal.    . mupirocin ointment (BACTROBAN) 2 % Apply 1 application topically 2 (two) times daily. 22 g 0  . oxyCODONE-acetaminophen (PERCOCET/ROXICET) 5-325 MG tablet Take 1 tablet by mouth every 6 (six) hours as needed for moderate pain. 30 tablet 0  . ramipril (ALTACE) 2.5 MG capsule Take 2.5 mg by mouth daily.    Marland Kitchen  warfarin (COUMADIN) 7.5 MG tablet Take 3.25-7.5 mg by mouth See admin instructions. Takes 3.25mg  on Tuesday ONLY Take 7.5 mg on Sun / Mon / Wed / Thurs / Fri / Sat     No current facility-administered medications for this visit.     REVIEW OF SYSTEMS:  [X]  denotes positive finding, [ ]  denotes negative finding Cardiac  Comments:  Chest pain or chest pressure:    Shortness of breath upon exertion:    Short of breath when lying flat:    Irregular heart rhythm:    Constitutional    Fever or chills:     PHYSICAL EXAM:   Vitals:   06/19/17 1145  BP: 115/75  Pulse: 95  Resp: 20  Temp: 98.6 F (37 C)  TempSrc: Oral  SpO2: 99%  Weight: 182 lb 4.8 oz (82.7 kg)  Height: 5\' 9"  (1.753 m)    GENERAL: The patient is a well-nourished male, in no acute distress. The vital signs are documented above. CARDIOVASCULAR: There is a regular rate and rhythm. PULMONARY: There is good air exchange bilaterally without wheezing or rales. His incisions in the left leg are healing nicely. He has moderate left leg swelling. He has a palpable graft pulse behind his knee. He has a brisk anterior tibial signal on the left with the Doppler. He has cellulitis on the left forefoot and ulcers where  the second and third toes rub together.  DATA:   X-ray right foot shows no evidence of osteomyelitis.  I reviewed his arteriogram that was done in September and this shows good flow down through the below-knee popliteal artery with single-vessel runoff via the anterior tibial artery which has moderate disease proximally.  MEDICAL ISSUES:   STATUS POST LEFT ABOVE-KNEE POPLITEAL TO ANTERIOR TIBIAL ARTERY BYPASS: His bypass graft is working well and the wound on the left foot has healed.  I encouraged him to elevate his leg for the swelling.  We will get him on his graft protocol to follow his graft closely.  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  For this reason I recommended we proceed with arteriography to see if the disease in his left leg has progressed into see if there is any endovascular options on the left.  His procedure is scheduled for 06/24/2017.  We have discussed the indications of the procedure and the potential complications and he is agreeable to proceed.  We will hold his Coumadin 4 days prior to the procedure.  His GFR is greater than 60.  Edward Marquez [facility] [place] [phone]

## 2017-06-19 NOTE — H&P (View-Only) (Signed)
Patient name: Edward Marquez MRN: [id number] DOB: September 04, 1950 Sex: male  REASON FOR VISIT:   Follow-up after bypass.  HPI:   Edward Marquez is a pleasant 66 y.o. 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  He presented with a nonhealing wound along the anterior lateral left leg.  He has a history of aortic valve replacement and is on chronic Coumadin therapy.  He was admitted with cellulitis and treated with intravenous antibiotics.  His Coumadin was held.  On 05/03/2017, he underwent a left above-knee popliteal to anterior tibial artery bypass with a vein graft.  We kept him in the hospital until his Coumadin was therapeutic.  He returns for his first outpatient visit.  Since I saw him last, he is developed cellulitis on his right foot and a wound between his second and third toes.  He denies fever.  He has been seen in the wound care center and has been on doxycycline.  He is on Coumadin  he does have a history of an aortic valve replacement.  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  He has had some swelling in the left leg after his bypass.  Current Outpatient Medications  Medication Sig Dispense Refill  . diltiazem (CARDIZEM CD) 120 MG 24 hr capsule Take 120 mg by mouth daily.    Marland Kitchen dofetilide (TIKOSYN) 500 MCG capsule Take 500 mcg by mouth 2 (two) times daily.     Marland Kitchen enoxaparin (LOVENOX) 80 MG/0.8ML injection Inject 0.8 mLs (80 mg total) into the skin every 12 (twelve) hours. 10 Syringe 1  . metoprolol succinate (TOPROL-XL) 100 MG 24 hr tablet Take 100 mg by mouth daily. Take with or immediately following a meal.    . mupirocin ointment (BACTROBAN) 2 % Apply 1 application topically 2 (two) times daily. 22 g 0  . oxyCODONE-acetaminophen (PERCOCET/ROXICET) 5-325 MG tablet Take 1 tablet by mouth every 6 (six) hours as needed for moderate pain. 30 tablet 0  . ramipril (ALTACE) 2.5 MG capsule Take 2.5 mg by mouth daily.    Marland Kitchen  warfarin (COUMADIN) 7.5 MG tablet Take 3.25-7.5 mg by mouth See admin instructions. Takes 3.25mg  on Tuesday ONLY Take 7.5 mg on Sun / Mon / Wed / Thurs / Fri / Sat     No current facility-administered medications for this visit.     REVIEW OF SYSTEMS:  [X]  denotes positive finding, [ ]  denotes negative finding Cardiac  Comments:  Chest pain or chest pressure:    Shortness of breath upon exertion:    Short of breath when lying flat:    Irregular heart rhythm:    Constitutional    Fever or chills:     PHYSICAL EXAM:   Vitals:   06/19/17 1145  BP: 115/75  Pulse: 95  Resp: 20  Temp: 98.6 F (37 C)  TempSrc: Oral  SpO2: 99%  Weight: 182 lb 4.8 oz (82.7 kg)  Height: 5\' 9"  (1.753 m)    GENERAL: The patient is a well-nourished male, in no acute distress. The vital signs are documented above. CARDIOVASCULAR: There is a regular rate and rhythm. PULMONARY: There is good air exchange bilaterally without wheezing or rales. His incisions in the left leg are healing nicely. He has moderate left leg swelling. He has a palpable graft pulse behind his knee. He has a brisk anterior tibial signal on the left with the Doppler. He has cellulitis on the left forefoot and ulcers where  the second and third toes rub together.  DATA:   X-ray right foot shows no evidence of osteomyelitis.  I reviewed his arteriogram that was done in September and this shows good flow down through the below-knee popliteal artery with single-vessel runoff via the anterior tibial artery which has moderate disease proximally.  MEDICAL ISSUES:   STATUS POST LEFT ABOVE-KNEE POPLITEAL TO ANTERIOR TIBIAL ARTERY BYPASS: His bypass graft is working well and the wound on the left foot has healed.  I encouraged him to elevate his leg for the swelling.  We will get him on his graft protocol to follow his graft closely.  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  For this reason I recommended we proceed with arteriography to see if the disease in his left leg has progressed into see if there is any endovascular options on the left.  His procedure is scheduled for 06/24/2017.  We have discussed the indications of the procedure and the potential complications and he is agreeable to proceed.  We will hold his Coumadin 4 days prior to the procedure.  His GFR is greater than 60.  [name] [facility] [place] [phone]

## 2017-06-20 ENCOUNTER — Ambulatory Visit: Payer: Medicare Other | Admitting: Surgery

## 2017-06-20 DIAGNOSIS — Z5181 Encounter for therapeutic drug level monitoring: Secondary | ICD-10-CM | POA: Diagnosis not present

## 2017-06-20 DIAGNOSIS — Z952 Presence of prosthetic heart valve: Secondary | ICD-10-CM | POA: Diagnosis not present

## 2017-06-20 DIAGNOSIS — I4891 Unspecified atrial fibrillation: Secondary | ICD-10-CM | POA: Diagnosis not present

## 2017-06-20 DIAGNOSIS — Z7901 Long term (current) use of anticoagulants: Secondary | ICD-10-CM | POA: Diagnosis not present

## 2017-06-21 ENCOUNTER — Inpatient Hospital Stay (HOSPITAL_COMMUNITY)
Admission: AD | Admit: 2017-06-21 | Discharge: 2017-06-27 | DRG: 253 | Disposition: A | Discharge status: Home / Self Care | Payer: Medicare Other | Source: Ambulatory Visit | Attending: Vascular Surgery | Admitting: Vascular Surgery

## 2017-06-21 DIAGNOSIS — Z952 Presence of prosthetic heart valve: Secondary | ICD-10-CM | POA: Diagnosis not present

## 2017-06-21 DIAGNOSIS — Z7901 Long term (current) use of anticoagulants: Secondary | ICD-10-CM | POA: Diagnosis not present

## 2017-06-21 DIAGNOSIS — Z79899 Other long term (current) drug therapy: Secondary | ICD-10-CM

## 2017-06-21 DIAGNOSIS — I872 Venous insufficiency (chronic) (peripheral): Secondary | ICD-10-CM | POA: Diagnosis not present

## 2017-06-21 DIAGNOSIS — I739 Peripheral vascular disease, unspecified: Secondary | ICD-10-CM | POA: Diagnosis not present

## 2017-06-21 DIAGNOSIS — I7389 Other specified peripheral vascular diseases: Principal | ICD-10-CM | POA: Diagnosis present

## 2017-06-21 DIAGNOSIS — L97519 Non-pressure chronic ulcer of other part of right foot with unspecified severity: Secondary | ICD-10-CM | POA: Diagnosis not present

## 2017-06-21 DIAGNOSIS — I70203 Unspecified atherosclerosis of native arteries of extremities, bilateral legs: Secondary | ICD-10-CM | POA: Diagnosis not present

## 2017-06-21 DIAGNOSIS — M109 Gout, unspecified: Secondary | ICD-10-CM | POA: Diagnosis not present

## 2017-06-21 DIAGNOSIS — Z91041 Radiographic dye allergy status: Secondary | ICD-10-CM

## 2017-06-21 DIAGNOSIS — L03115 Cellulitis of right lower limb: Secondary | ICD-10-CM | POA: Diagnosis not present

## 2017-06-21 LAB — CBC
HCT: 36.3 % — ABNORMAL LOW (ref 39.0–52.0)
Hemoglobin: 12.3 g/dL — ABNORMAL LOW (ref 13.0–17.0)
MCH: 32.5 pg (ref 26.0–34.0)
MCHC: 33.9 g/dL (ref 30.0–36.0)
MCV: 95.8 fL (ref 78.0–100.0)
Platelets: 172 10*3/uL (ref 150–400)
RBC: 3.79 MIL/uL — ABNORMAL LOW (ref 4.22–5.81)
RDW: 13.1 % (ref 11.5–15.5)
WBC: 8.3 10*3/uL (ref 4.0–10.5)

## 2017-06-21 MED ORDER — IBUPROFEN-DIPHENHYDRAMINE HCL 200-25 MG PO CAPS: 2.0000 | ORAL_CAPSULE | Freq: Every evening | ORAL | Status: DC | PRN

## 2017-06-21 MED ORDER — DOXYCYCLINE HYCLATE 100 MG PO TABS
100.0000 mg | ORAL_TABLET | Freq: Two times a day (BID) | ORAL | Status: DC
  Administered 2017-06-22: 100 mg via ORAL
  Filled 2017-06-21: qty 1

## 2017-06-21 MED ORDER — SODIUM CHLORIDE 0.9% FLUSH
3.0000 mL | Freq: Two times a day (BID) | INTRAVENOUS | Status: DC
  Administered 2017-06-21 – 2017-06-24 (×3): 3 mL via INTRAVENOUS

## 2017-06-21 MED ORDER — DIPHENHYDRAMINE HCL 25 MG PO CAPS: 25.0000 mg | ORAL_CAPSULE | Freq: Every evening | ORAL | Status: DC | PRN

## 2017-06-21 MED ORDER — SODIUM CHLORIDE 0.9% FLUSH: 3.0000 mL | INTRAVENOUS | Status: DC | PRN

## 2017-06-21 MED ORDER — METOPROLOL SUCCINATE ER 100 MG PO TB24
100.0000 mg | ORAL_TABLET | Freq: Every day | ORAL | Status: DC
  Administered 2017-06-22 – 2017-06-27 (×6): 100 mg via ORAL
  Filled 2017-06-21 (×6): qty 1

## 2017-06-21 MED ORDER — POTASSIUM CHLORIDE CRYS ER 20 MEQ PO TBCR
20.0000 meq | EXTENDED_RELEASE_TABLET | Freq: Once | ORAL | Status: AC
  Administered 2017-06-22: 20 meq via ORAL
  Filled 2017-06-21: qty 1

## 2017-06-21 MED ORDER — IBUPROFEN 200 MG PO TABS: 200.0000 mg | ORAL_TABLET | Freq: Every evening | ORAL | Status: DC | PRN

## 2017-06-21 MED ORDER — DOFETILIDE 500 MCG PO CAPS
500.0000 ug | ORAL_CAPSULE | Freq: Two times a day (BID) | ORAL | Status: DC
Start: 2017-06-21 — End: 2017-06-24
  Administered 2017-06-22 – 2017-06-24 (×5): 500 ug via ORAL
  Filled 2017-06-21 (×5): qty 1

## 2017-06-21 MED ORDER — LABETALOL HCL 5 MG/ML IV SOLN: 10.0000 mg | INTRAVENOUS | Status: DC | PRN

## 2017-06-21 MED ORDER — PANTOPRAZOLE SODIUM 40 MG PO TBEC
40.0000 mg | DELAYED_RELEASE_TABLET | Freq: Every day | ORAL | Status: DC
  Administered 2017-06-22 – 2017-06-23 (×3): 40 mg via ORAL
  Filled 2017-06-21 (×3): qty 1

## 2017-06-21 MED ORDER — IBUPROFEN 400 MG PO TABS
400.0000 mg | ORAL_TABLET | Freq: Four times a day (QID) | ORAL | Status: DC | PRN
  Administered 2017-06-23: 400 mg via ORAL
  Filled 2017-06-21: qty 1

## 2017-06-21 MED ORDER — DILTIAZEM HCL ER COATED BEADS 120 MG PO CP24
120.0000 mg | ORAL_CAPSULE | Freq: Two times a day (BID) | ORAL | Status: DC
  Administered 2017-06-22 – 2017-06-27 (×11): 120 mg via ORAL
  Filled 2017-06-21 (×11): qty 1

## 2017-06-21 MED ORDER — OXYCODONE-ACETAMINOPHEN 5-325 MG PO TABS: 1.0000 | ORAL_TABLET | ORAL | Status: DC | PRN

## 2017-06-21 MED ORDER — GUAIFENESIN-DM 100-10 MG/5ML PO SYRP: 15.0000 mL | ORAL_SOLUTION | ORAL | Status: DC | PRN

## 2017-06-21 MED ORDER — RAMIPRIL 2.5 MG PO CAPS
2.5000 mg | ORAL_CAPSULE | Freq: Every day | ORAL | Status: DC
  Administered 2017-06-22 – 2017-06-27 (×6): 2.5 mg via ORAL
  Filled 2017-06-21 (×6): qty 1

## 2017-06-21 MED ORDER — ONDANSETRON HCL 4 MG/2ML IJ SOLN: 4.0000 mg | Freq: Four times a day (QID) | INTRAMUSCULAR | Status: DC | PRN

## 2017-06-21 MED ORDER — SODIUM CHLORIDE 0.9 % IV SOLN: 250.0000 mL | INTRAVENOUS | Status: DC | PRN

## 2017-06-21 MED ORDER — METOPROLOL TARTRATE 5 MG/5ML IV SOLN: 2.0000 mg | INTRAVENOUS | Status: DC | PRN

## 2017-06-21 MED ORDER — PHENOL 1.4 % MT LIQD: 1.0000 | OROMUCOSAL | Status: DC | PRN

## 2017-06-21 MED ORDER — ALUM & MAG HYDROXIDE-SIMETH 200-200-20 MG/5ML PO SUSP: 15.0000 mL | ORAL | Status: DC | PRN

## 2017-06-21 MED ORDER — HYDRALAZINE HCL 20 MG/ML IJ SOLN: 5.0000 mg | INTRAMUSCULAR | Status: DC | PRN

## 2017-06-22 LAB — HEPARIN LEVEL (UNFRACTIONATED)
HEPARIN UNFRACTIONATED: 0.21 [IU]/mL — AB (ref 0.30–0.70)
HEPARIN UNFRACTIONATED: 0.52 [IU]/mL (ref 0.30–0.70)

## 2017-06-22 LAB — BASIC METABOLIC PANEL
Anion gap: 5 (ref 5–15)
BUN: 17 mg/dL (ref 6–20)
CALCIUM: 9 mg/dL (ref 8.9–10.3)
CO2: 24 mmol/L (ref 22–32)
CREATININE: 0.74 mg/dL (ref 0.61–1.24)
Chloride: 108 mmol/L (ref 101–111)
GFR calc non Af Amer: >60 mL/min (ref >60)
Glucose, Bld: 101 mg/dL — ABNORMAL HIGH (ref 65–99)
Potassium: 4.6 mmol/L (ref 3.5–5.1)
SODIUM: 137 mmol/L (ref 135–145)

## 2017-06-22 LAB — COMPREHENSIVE METABOLIC PANEL
ALBUMIN: 3.7 g/dL (ref 3.5–5.0)
ALK PHOS: 61 U/L (ref 38–126)
ALT: 24 U/L (ref 17–63)
ANION GAP: 9 (ref 5–15)
AST: 23 U/L (ref 15–41)
BILIRUBIN TOTAL: 0.7 mg/dL (ref 0.3–1.2)
BUN: 21 mg/dL — AB (ref 6–20)
CALCIUM: 8.8 mg/dL — AB (ref 8.9–10.3)
CO2: 20 mmol/L — AB (ref 22–32)
Chloride: 109 mmol/L (ref 101–111)
Creatinine, Ser: 0.8 mg/dL (ref 0.61–1.24)
GFR calc Af Amer: >60 mL/min (ref >60)
GFR calc non Af Amer: >60 mL/min (ref >60)
GLUCOSE: 107 mg/dL — AB (ref 65–99)
Potassium: 3.8 mmol/L (ref 3.5–5.1)
SODIUM: 138 mmol/L (ref 135–145)
TOTAL PROTEIN: 6.3 g/dL — AB (ref 6.5–8.1)

## 2017-06-22 LAB — PROTIME-INR
INR: 2.24
Prothrombin Time: 24.6 seconds — ABNORMAL HIGH (ref 11.4–15.2)

## 2017-06-22 LAB — SURGICAL PCR SCREEN
MRSA, PCR: NEGATIVE
Staphylococcus aureus: NEGATIVE

## 2017-06-22 MED ORDER — HEPARIN (PORCINE) IN NACL 100-0.45 UNIT/ML-% IJ SOLN
1200.0000 [IU]/h | INTRAMUSCULAR | Status: DC
  Administered 2017-06-22: 1350 [IU]/h via INTRAVENOUS
  Administered 2017-06-22 – 2017-06-23 (×2): 1200 [IU]/h via INTRAVENOUS
  Filled 2017-06-22 (×3): qty 250

## 2017-06-22 MED ORDER — DOXYCYCLINE HYCLATE 100 MG PO TABS
100.0000 mg | ORAL_TABLET | Freq: Two times a day (BID) | ORAL | Status: AC
  Administered 2017-06-22: 100 mg via ORAL
  Filled 2017-06-22: qty 1

## 2017-06-22 NOTE — H&P (Signed)
Patient name: Edward Marquez MRN: [id number]        DOB: Feb 07, 1951            Sex: male   REASON FOR VISIT:    Follow-up after bypass.   HPI:    Edward Marquez is a pleasant 66 y.o. 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  He presented with a nonhealing wound along the anterior lateral left leg.  He has a history of aortic valve replacement and is on chronic Coumadin therapy.  He was admitted with cellulitis and treated with intravenous antibiotics.  His Coumadin was held.  On 05/03/2017, he underwent a left above-knee popliteal to anterior tibial artery bypass with a vein graft.  We kept him in the hospital until his Coumadin was therapeutic.  He returns for his first outpatient visit.   Since I saw him last, he is developed cellulitis on his right foot and a wound between his second and third toes.  He denies fever.  He has been seen in the wound care center and has been on doxycycline.   He is on Coumadin  he does have a history of an aortic valve replacement.  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   He has had some swelling in the left leg after his bypass.         Current Outpatient Medications  Medication Sig Dispense Refill  . diltiazem (CARDIZEM CD) 120 MG 24 hr capsule Take 120 mg by mouth daily.      Marland Kitchen dofetilide (TIKOSYN) 500 MCG capsule Take 500 mcg by mouth 2 (two) times daily.       Marland Kitchen enoxaparin (LOVENOX) 80 MG/0.8ML injection Inject 0.8 mLs (80 mg total) into the skin every 12 (twelve) hours. 10 Syringe 1  . metoprolol succinate (TOPROL-XL) 100 MG 24 hr tablet Take 100 mg by mouth daily. Take with or immediately following a meal.      . mupirocin ointment (BACTROBAN) 2 % Apply 1 application topically 2 (two) times daily. 22 g 0  . oxyCODONE-acetaminophen (PERCOCET/ROXICET) 5-325 MG tablet Take 1 tablet by mouth every 6 (six) hours as needed for moderate pain. 30 tablet 0  . ramipril (ALTACE) 2.5 MG capsule  Take 2.5 mg by mouth daily.      Marland Kitchen warfarin (COUMADIN) 7.5 MG tablet Take 3.25-7.5 mg by mouth See admin instructions. Takes 3.25mg  on Tuesday ONLY Take 7.5 mg on Sun / Mon / Wed / Thurs / Fri / Sat        No current facility-administered medications for this visit.       REVIEW OF SYSTEMS:  [X]  denotes positive finding, [ ]  denotes negative finding Cardiac   Comments:  Chest pain or chest pressure:      Shortness of breath upon exertion:      Short of breath when lying flat:      Irregular heart rhythm:      Constitutional      Fever or chills:        PHYSICAL EXAM:     Vitals:   06/21/17 2111 06/22/17 0702 06/22/17 0843  BP: 128/73 (!) 147/81 117/80  Pulse: (!) 103 95 91  Resp: 14 14   Temp: 97.7 F (36.5 C) 98.4 F (36.9 C)   TempSrc: Oral Oral Oral  SpO2: 100% 100% 100%  Weight: 185 lb (83.9 kg)      GENERAL: The patient is a well-nourished  male, in no acute distress. The vital signs are documented above. CARDIOVASCULAR: There is a regular rate and rhythm. PULMONARY: There is good air exchange bilaterally without wheezing or rales. His incisions in the left leg are healing nicely. He has moderate left leg swelling. He has a palpable graft pulse behind his knee. He has a brisk anterior tibial signal on the left with the Doppler. He has cellulitis on the left forefoot and ulcers where the second and third toes rub together.   DATA:    X-ray right foot shows no evidence of osteomyelitis.   I reviewed his arteriogram that was done in September and this shows good flow down through the below-knee popliteal artery with single-vessel runoff via the anterior tibial artery which has moderate disease proximally.   MEDICAL ISSUES:    STATUS POST LEFT ABOVE-KNEE POPLITEAL TO ANTERIOR TIBIAL ARTERY BYPASS: His bypass graft is working well and the wound on the left foot has healed.  I encouraged him to elevate his leg for the swelling.  We will get him on his graft protocol to  follow his graft closely.   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  For this reason I recommended we proceed with arteriography to see if the disease in his left leg has progressed into see if there is any endovascular options on the left.  His procedure is scheduled for 06/24/2017.  We have discussed the indications of the procedure and the potential complications and he is agreeable to proceed.  We will hold his Coumadin 4 days prior to the procedure.  His GFR is greater than 60.   Pt was not thought to be a lovenox bridge candidate by Dr Ola Spurr.  He will be admitted today for heparin bridge.  Angio for Monday  Ruta Hinds, MD [facility] [place] Office: [phone] Pager: [phone]

## 2017-06-22 NOTE — Progress Notes (Addendum)
ANTICOAGULATION CONSULT NOTE - Initial Consult  Pharmacy Consult for Heparin  Indication: atrial fibrillation and mechanical aortic valve  Allergies  Allergen Reactions  . Ivp Dye [Iodinated Diagnostic Agents] Hives    Patient Measurements: Weight: 185 lb (83.9 kg)  Vital Signs: Temp: 97.7 F (36.5 C) (11/10 1100) Temp Source: Oral (11/10 1100) BP: 98/70 (11/10 1100) Pulse Rate: 73 (11/10 1100)  Labs: Recent Labs    06/21/17 2303 06/22/17 0242 06/22/17 1034 06/22/17 1314  HGB 12.3*  --   --   --   HCT 36.3*  --   --   --   PLT 172  --   --   --   LABPROT  --  24.6*  --   --   INR  --  2.24  --   --   HEPARINUNFRC  --   --   --  0.21*  CREATININE 0.80  --  0.74  --     Estimated Creatinine Clearance: 92.1 mL/min (by C-G formula based on SCr of 0.74 mg/dL).   Medical History: Past Medical History:  Diagnosis Date  . Aortic valve defect   . Atrial fibrillation ([place])   . CHF (congestive heart failure) ([place])   . Gout   . Hypertension   . Peripheral vascular disease [facility])     Assessment: 66 yo male on IV heparin while warfarin on hold (history of mechanical aortic valve w/ afib  (INR target 2.5-3.5).  Plans are for LE angiogram on 11/12. -heparin level= 0.21 on heparin 1200 units/hr  Goal of Therapy:  Heparin level 0.3-0.7 units/ml Monitor platelets by anticoagulation protocol: Yes   Plan:  -Increase heparin to 1350 units/hr -Heparin level in 6 hours and daily wth CBC daily  Hildred Laser, Pharm D 06/22/2017 2:07 PM  Addendum -heparin level= 0.67 and at goal  Plan -no heparin changes needed  Hildred Laser, Pharm D 06/22/2017 8:35 PM

## 2017-06-22 NOTE — Progress Notes (Addendum)
[facility] [place]  Subjective  - Doing ok    Objective (!) 147/81 95 98.4 F (36.9 C) (Oral) 14 100% No intake or output data in the 24 hours ending 06/22/17 0803  Right foot erythema, wound on second toe between second and third, betadine and dry guaze placed between toes. Left LE edema, incisions healed. Palpable graft pulse at left knee  Assessment/Planning: Right second toe non healing ulcer"kissing corn lesion" with forefoot erythema  Plan hold coumadin on full dose heparin for angiogram Monday by Dr. Scot Dock.    Laurence Slate [facility] 06/22/2017 8:03 AM --  Agree with above Angio Monday Heparin when INR less than 2 Check daily INR  Ruta Hinds, MD [facility] [place]: [phone] Pager: [phone]   Laboratory Lab Results: Recent Labs    06/21/17 2303  WBC 8.3  HGB 12.3*  HCT 36.3*  PLT 172   BMET Recent Labs    06/21/17 2303  NA 138  K 3.8  CL 109  CO2 20*  GLUCOSE 107*  BUN 21*  CREATININE 0.80  CALCIUM 8.8*    COAG Lab Results  Component Value Date   INR 2.24 06/22/2017   INR 1.93 05/09/2017   INR 1.61 05/08/2017   No results found for: PTT

## 2017-06-22 NOTE — Progress Notes (Signed)
ANTICOAGULATION CONSULT NOTE - Initial Consult  Pharmacy Consult for Heparin  Indication: atrial fibrillation and mechanical aortic valve  Allergies  Allergen Reactions  . Ivp Dye [Iodinated Diagnostic Agents] Hives    Patient Measurements: Weight: 185 lb (83.9 kg)  Vital Signs: Temp: 97.7 F (36.5 C) (11/09 2111) Temp Source: Oral (11/09 2111) BP: 128/73 (11/09 2111) Pulse Rate: 103 (11/09 2111)  Labs: Recent Labs    06/21/17 2303 06/22/17 0242  HGB 12.3*  --   HCT 36.3*  --   PLT 172  --   LABPROT  --  24.6*  INR  --  2.24  CREATININE 0.80  --     Estimated Creatinine Clearance: 92.1 mL/min (by C-G formula based on SCr of 0.8 mg/dL).   Medical History: Past Medical History:  Diagnosis Date  . Aortic valve defect   . Atrial fibrillation ([place])   . CHF (congestive heart failure) ([place])   . Gout   . Hypertension   . Peripheral vascular disease ([place])     Assessment: Heparin while warfarin on hold, plans to start heparin when INR <2.5, INR is 2.24 this AM so we will start heparin now  Goal of Therapy:  Heparin level 0.3-0.7 units/ml Monitor platelets by anticoagulation protocol: Yes   Plan:  Start heparin drip at 1200 units/hr 1300 HL  Yadiel Aubry 06/22/2017,4:50 AM

## 2017-06-23 ENCOUNTER — Other Ambulatory Visit: Payer: Self-pay

## 2017-06-23 ENCOUNTER — Encounter (HOSPITAL_COMMUNITY): Payer: Self-pay | Admitting: *Deleted

## 2017-06-23 LAB — BASIC METABOLIC PANEL
ANION GAP: 6 (ref 5–15)
BUN: 18 mg/dL (ref 6–20)
CALCIUM: 9 mg/dL (ref 8.9–10.3)
CO2: 26 mmol/L (ref 22–32)
Chloride: 106 mmol/L (ref 101–111)
Creatinine, Ser: 0.94 mg/dL (ref 0.61–1.24)
Glucose, Bld: 107 mg/dL — ABNORMAL HIGH (ref 65–99)
Potassium: 4.4 mmol/L (ref 3.5–5.1)
Sodium: 138 mmol/L (ref 135–145)

## 2017-06-23 LAB — HEPARIN LEVEL (UNFRACTIONATED)
HEPARIN UNFRACTIONATED: 0.96 [IU]/mL — AB (ref 0.30–0.70)
Heparin Unfractionated: 0.67 IU/mL (ref 0.30–0.70)

## 2017-06-23 LAB — PROTIME-INR
INR: 1.88
INR: 1.4
PROTHROMBIN TIME: 21.4 s — AB (ref 11.4–15.2)
Prothrombin Time: 17 seconds — ABNORMAL HIGH (ref 11.4–15.2)

## 2017-06-23 LAB — MAGNESIUM: Magnesium: 1.8 mg/dL (ref 1.7–2.4)

## 2017-06-23 MED ORDER — FAMOTIDINE IN NACL 20-0.9 MG/50ML-% IV SOLN
20.0000 mg | INTRAVENOUS | Status: AC
  Administered 2017-06-24: 20 mg via INTRAVENOUS
  Filled 2017-06-23: qty 50

## 2017-06-23 MED ORDER — SODIUM CHLORIDE 0.9 % IV SOLN: INTRAVENOUS | Status: DC

## 2017-06-23 MED ORDER — METHYLPREDNISOLONE SODIUM SUCC 125 MG IJ SOLR
125.0000 mg | INTRAMUSCULAR | Status: AC
  Administered 2017-06-24: 125 mg via INTRAVENOUS
  Filled 2017-06-23: qty 2

## 2017-06-23 MED ORDER — DIPHENHYDRAMINE HCL 50 MG/ML IJ SOLN
25.0000 mg | INTRAMUSCULAR | Status: AC
  Administered 2017-06-24: 25 mg via INTRAVENOUS
  Filled 2017-06-23: qty 1

## 2017-06-24 ENCOUNTER — Encounter (HOSPITAL_COMMUNITY)
Admission: AD | Disposition: A | Discharge status: Home / Self Care | Payer: Self-pay | Source: Ambulatory Visit | Attending: Vascular Surgery

## 2017-06-24 ENCOUNTER — Encounter (HOSPITAL_COMMUNITY): Payer: Self-pay | Admitting: Vascular Surgery

## 2017-06-24 DIAGNOSIS — I739 Peripheral vascular disease, unspecified: Secondary | ICD-10-CM

## 2017-06-24 HISTORY — PX: PERIPHERAL VASCULAR BALLOON ANGIOPLASTY: CATH118281

## 2017-06-24 HISTORY — PX: LOWER EXTREMITY ANGIOGRAPHY: CATH118251

## 2017-06-24 LAB — BASIC METABOLIC PANEL
Anion gap: 7 (ref 5–15)
BUN: 15 mg/dL (ref 6–20)
CO2: 25 mmol/L (ref 22–32)
CREATININE: 0.99 mg/dL (ref 0.61–1.24)
Calcium: 8.8 mg/dL — ABNORMAL LOW (ref 8.9–10.3)
Chloride: 106 mmol/L (ref 101–111)
Glucose, Bld: 100 mg/dL — ABNORMAL HIGH (ref 65–99)
POTASSIUM: 4.3 mmol/L (ref 3.5–5.1)
SODIUM: 138 mmol/L (ref 135–145)

## 2017-06-24 LAB — CBC
HCT: 36.8 % — ABNORMAL LOW (ref 39.0–52.0)
Hemoglobin: 12.6 g/dL — ABNORMAL LOW (ref 13.0–17.0)
MCH: 33.2 pg (ref 26.0–34.0)
MCHC: 34.2 g/dL (ref 30.0–36.0)
MCV: 97.1 fL (ref 78.0–100.0)
PLATELETS: 171 10*3/uL (ref 150–400)
RBC: 3.79 MIL/uL — AB (ref 4.22–5.81)
RDW: 13.7 % (ref 11.5–15.5)
WBC: 7.1 10*3/uL (ref 4.0–10.5)

## 2017-06-24 LAB — HEPARIN LEVEL (UNFRACTIONATED): HEPARIN UNFRACTIONATED: 0.48 [IU]/mL (ref 0.30–0.70)

## 2017-06-24 LAB — POCT ACTIVATED CLOTTING TIME
ACTIVATED CLOTTING TIME: 213 s
ACTIVATED CLOTTING TIME: 191 s
ACTIVATED CLOTTING TIME: 169 s
Activated Clotting Time: 208 seconds
Activated Clotting Time: 224 seconds
Activated Clotting Time: 230 seconds
Activated Clotting Time: 230 seconds

## 2017-06-24 LAB — MAGNESIUM: Magnesium: 1.9 mg/dL (ref 1.7–2.4)

## 2017-06-24 LAB — PROTIME-INR
INR: 1.32
PROTHROMBIN TIME: 16.3 s — AB (ref 11.4–15.2)

## 2017-06-24 SURGERY — LOWER EXTREMITY ANGIOGRAPHY
Anesthesia: LOCAL | Laterality: Right

## 2017-06-24 MED ORDER — SODIUM CHLORIDE 0.9 % WEIGHT BASED INFUSION: 1.0000 mL/kg/h | INTRAVENOUS | Status: AC

## 2017-06-24 MED ORDER — IODIXANOL 320 MG/ML IV SOLN
INTRAVENOUS | Status: DC | PRN
  Administered 2017-06-24: 140 mL via INTRAVENOUS

## 2017-06-24 MED ORDER — LIDOCAINE HCL (PF) 1 % IJ SOLN
INTRAMUSCULAR | Status: DC | PRN
  Administered 2017-06-24: 15 mL

## 2017-06-24 MED ORDER — HEPARIN SODIUM (PORCINE) 1000 UNIT/ML IJ SOLN
INTRAMUSCULAR | Status: DC | PRN
  Administered 2017-06-24: 7000 [IU] via INTRAVENOUS
  Administered 2017-06-24 (×2): 1000 [IU] via INTRAVENOUS
  Administered 2017-06-24: 2000 [IU] via INTRAVENOUS

## 2017-06-24 MED ORDER — HEPARIN SODIUM (PORCINE) 1000 UNIT/ML IJ SOLN
INTRAMUSCULAR | Status: AC
  Filled 2017-06-24: qty 1

## 2017-06-24 MED ORDER — OXYCODONE HCL 5 MG PO TABS: 5.0000 mg | ORAL_TABLET | ORAL | Status: DC | PRN

## 2017-06-24 MED ORDER — ACETAMINOPHEN 325 MG PO TABS: 650.0000 mg | ORAL_TABLET | ORAL | Status: DC | PRN

## 2017-06-24 MED ORDER — HEPARIN (PORCINE) IN NACL 2-0.9 UNIT/ML-% IJ SOLN
INTRAMUSCULAR | Status: AC | PRN
  Administered 2017-06-24: 1000 mL via INTRA_ARTERIAL

## 2017-06-24 MED ORDER — LABETALOL HCL 5 MG/ML IV SOLN: 10.0000 mg | INTRAVENOUS | Status: DC | PRN

## 2017-06-24 MED ORDER — HEPARIN (PORCINE) IN NACL 100-0.45 UNIT/ML-% IJ SOLN
1250.0000 [IU]/h | INTRAMUSCULAR | Status: DC
  Administered 2017-06-24: 1200 [IU]/h via INTRAVENOUS
  Administered 2017-06-25 – 2017-06-26 (×2): 1250 [IU]/h via INTRAVENOUS
  Filled 2017-06-24 (×3): qty 250

## 2017-06-24 MED ORDER — FENTANYL CITRATE (PF) 100 MCG/2ML IJ SOLN
INTRAMUSCULAR | Status: AC
  Filled 2017-06-24: qty 2

## 2017-06-24 MED ORDER — MIDAZOLAM HCL 2 MG/2ML IJ SOLN
INTRAMUSCULAR | Status: DC | PRN
  Administered 2017-06-24 (×2): 1 mg via INTRAVENOUS

## 2017-06-24 MED ORDER — FENTANYL CITRATE (PF) 100 MCG/2ML IJ SOLN
INTRAMUSCULAR | Status: DC | PRN
  Administered 2017-06-24 (×2): 50 ug via INTRAVENOUS

## 2017-06-24 MED ORDER — WARFARIN - PHARMACIST DOSING INPATIENT
Freq: Every day | Status: DC
  Administered 2017-06-26: 17:00:00

## 2017-06-24 MED ORDER — HEPARIN (PORCINE) IN NACL 2-0.9 UNIT/ML-% IJ SOLN
INTRAMUSCULAR | Status: AC
  Filled 2017-06-24: qty 1000

## 2017-06-24 MED ORDER — SODIUM CHLORIDE 0.9 % IV SOLN: 250.0000 mL | INTRAVENOUS | Status: DC | PRN

## 2017-06-24 MED ORDER — MIDAZOLAM HCL 2 MG/2ML IJ SOLN
INTRAMUSCULAR | Status: AC
  Filled 2017-06-24: qty 2

## 2017-06-24 MED ORDER — LIDOCAINE HCL (PF) 1 % IJ SOLN
INTRAMUSCULAR | Status: AC
  Filled 2017-06-24: qty 30

## 2017-06-24 MED ORDER — ASPIRIN EC 81 MG PO TBEC
81.0000 mg | DELAYED_RELEASE_TABLET | Freq: Every day | ORAL | Status: DC
  Administered 2017-06-25 – 2017-06-27 (×3): 81 mg via ORAL
  Filled 2017-06-24 (×3): qty 1

## 2017-06-24 MED ORDER — SODIUM CHLORIDE 0.9% FLUSH: 3.0000 mL | INTRAVENOUS | Status: DC | PRN

## 2017-06-24 MED ORDER — DOFETILIDE 500 MCG PO CAPS
500.0000 ug | ORAL_CAPSULE | Freq: Two times a day (BID) | ORAL | Status: DC
  Administered 2017-06-24 – 2017-06-27 (×6): 500 ug via ORAL
  Filled 2017-06-24 (×6): qty 1

## 2017-06-24 MED ORDER — CEFAZOLIN SODIUM-DEXTROSE 1-4 GM/50ML-% IV SOLN
1.0000 g | Freq: Three times a day (TID) | INTRAVENOUS | Status: DC
  Administered 2017-06-24 – 2017-06-27 (×9): 1 g via INTRAVENOUS
  Filled 2017-06-24 (×10): qty 50

## 2017-06-24 MED ORDER — HYDRALAZINE HCL 20 MG/ML IJ SOLN: 5.0000 mg | INTRAMUSCULAR | Status: DC | PRN

## 2017-06-24 MED ORDER — SODIUM CHLORIDE 0.9% FLUSH: 3.0000 mL | Freq: Two times a day (BID) | INTRAVENOUS | Status: DC

## 2017-06-24 MED ORDER — ONDANSETRON HCL 4 MG/2ML IJ SOLN: 4.0000 mg | Freq: Four times a day (QID) | INTRAMUSCULAR | Status: DC | PRN

## 2017-06-24 MED ORDER — WARFARIN SODIUM 10 MG PO TABS
10.0000 mg | ORAL_TABLET | Freq: Once | ORAL | Status: AC
  Administered 2017-06-24: 10 mg via ORAL
  Filled 2017-06-24: qty 1

## 2017-06-24 SURGICAL SUPPLY — 28 items
BAG SNAP BAND KOVER 36X36 (MISCELLANEOUS) ×1 IMPLANT
BALLN COYOTE OTW 2X220X150 (BALLOONS) ×3
BALLN COYOTE OTW 3X220X150 (BALLOONS) ×3
BALLN STERLING OTW 2X150X150 (BALLOONS) ×3
BALLOON COYOTE OTW 2X220X150 (BALLOONS) IMPLANT
BALLOON COYOTE OTW 3X220X150 (BALLOONS) IMPLANT
BALLOON STERLING OTW 2X150X150 (BALLOONS) IMPLANT
CATH CROSS OVER TEMPO 5F (CATHETERS) ×1 IMPLANT
CATH QUICKCROSS .018X135CM (MICROCATHETER) ×1 IMPLANT
COVER DOME SNAP 22 D (MISCELLANEOUS) ×1 IMPLANT
COVER PRB 48X5XTLSCP FOLD TPE (BAG) IMPLANT
COVER PROBE 5X48 (BAG) ×3
DEVICE CONTINUOUS FLUSH (MISCELLANEOUS) ×1 IMPLANT
GUIDEWIRE STR TIP .014X300X8 (WIRE) ×1 IMPLANT
KIT ENCORE 26 ADVANTAGE (KITS) ×1 IMPLANT
KIT MICROINTRODUCER STIFF 5F (SHEATH) ×1 IMPLANT
KIT PV (KITS) ×3 IMPLANT
SHEATH HIGHFLEX ANSEL 6FRX55 (SHEATH) ×1 IMPLANT
SHEATH PINNACLE 5F 10CM (SHEATH) ×1 IMPLANT
SHIELD RADPAD SCOOP 12X17 (MISCELLANEOUS) ×1 IMPLANT
SYR MEDRAD MARK V 150ML (SYRINGE) ×3 IMPLANT
TAPE RADIOPAQUE TURBO (MISCELLANEOUS) ×1 IMPLANT
TRANSDUCER W/STOPCOCK (MISCELLANEOUS) ×3 IMPLANT
TRAY PV CATH (CUSTOM PROCEDURE TRAY) ×3 IMPLANT
WIRE G V18X300CM (WIRE) ×1 IMPLANT
WIRE HI TORQ VERSACORE J 260CM (WIRE) ×1 IMPLANT
WIRE ROSEN-J .035X260CM (WIRE) ×1 IMPLANT
WIRE SPARTACORE .014X300CM (WIRE) ×1 IMPLANT

## 2017-06-24 NOTE — Progress Notes (Addendum)
Site area: LFA Site Prior to Removal:  Level 0 Pressure Applied For: 25 min Manual:   yes Patient Status During Pull:  stable Post Pull Site:  Level 0 Post Pull Instructions Given: yes  Post Pull Pulses Present: doppler Dressing Applied:  tegaderm Bedrest begins @ 8850 till 1745 Comments:

## 2017-06-24 NOTE — Interval H&P Note (Signed)
History and Physical Interval Note:  06/24/2017 9:04 AM  Edward Marquez  has presented today for surgery, with the diagnosis of pvd  The various methods of treatment have been discussed with the patient and family. After consideration of risks, benefits and other options for treatment, the patient has consented to  Procedure(s): LOWER EXTREMITY ANGIOGRAPHY - Right (N/A) as a surgical intervention .  The patient's history has been reviewed, patient examined, no change in status, stable for surgery.  I have reviewed the patient's chart and labs.  Questions were answered to the patient's satisfaction.     [name]

## 2017-06-24 NOTE — Progress Notes (Signed)
ANTICOAGULATION CONSULT NOTE  Pharmacy Consult for Heparin  Indication: atrial fibrillation and mechanical aortic valve  Allergies  Allergen Reactions  . Ivp Dye [Iodinated Diagnostic Agents] Hives    Patient Measurements: Height: 5\' 9"  (175.3 cm) Weight: 184 lb 15.5 oz (83.9 kg) IBW/kg (Calculated) : 70.7  Vital Signs: BP: 125/68 (11/12 1424) Pulse Rate: 82 (11/12 1355)  Labs: Recent Labs    06/21/17 2303  06/22/17 1034  06/23/17 0202 06/23/17 1123 06/23/17 1829 06/24/17 0305  HGB 12.3*  --   --   --   --   --   --  12.6*  HCT 36.3*  --   --   --   --   --   --  36.8*  PLT 172  --   --   --   --   --   --  171  LABPROT  --    < >  --   --  21.4*  --  17.0* 16.3*  INR  --    < >  --   --  1.88  --  1.40 1.32  HEPARINUNFRC  --   --   --    < >  --  0.96* 0.67 0.48  CREATININE 0.80  --  0.74  --  0.94  --   --  0.99   < > = values in this interval not displayed.    Medical History: Past Medical History:  Diagnosis Date  . Aortic valve defect   . Atrial fibrillation ([place])   . CHF (congestive heart failure) ([place])   . Gout   . Hypertension   . Peripheral vascular disease [facility])     Assessment: 66 yo male with history of mechanical aortic valve and AFib - on warfarin prior to admission. Patient is s/p R lower extremity arteriogram with intervention for severe occlusive arterial disease. Procedure today showed severe disease in the anterior tibial artery. Sheath was removed ~1400 today, heparin will resume tonight.  PTA warfarin per outpatient anticoagulation notes: 3.75 mg q Tues/Thurs/Sat and 7.5 mg on all other days  Goal of Therapy:  Heparin level 0.3-0.7 units/ml Monitor platelets by anticoagulation protocol: Yes  INR goal 2.5-3.5    Plan:  -Heparin to resume tonight at 2200, resume at rate of 1200 units/hr -Daily HL, CBC, INR -Warfarin 10 mg po x1     Hughes Better, PharmD, BCPS Clinical Pharmacist 06/24/2017 3:49 PM

## 2017-06-24 NOTE — Progress Notes (Signed)
Pt in cath holding post procedure. Awake, alert, oriented, mae. No C/O pain. Sheath in LFA,site level zero. Left dp weakly dopplerable. Right dp and pt audible with doppler.

## 2017-06-24 NOTE — Op Note (Signed)
PATIENT: Edward Marquez   MRN: [id number] DOB: 1951/05/09    DATE OF PROCEDURE: 06/24/2017  INDICATIONS:    Minor Iden is a 66 y.o. male who presents with cellulitis of the right foot.  He has known severe tibial artery occlusive disease.  He presents for arteriography and possible intervention.  PROCEDURE:    1.  Ultrasound-guided cannulation of the left common femoral artery 2.  Selective catheterization of the right superficial femoral artery 3.  Conscious sedation 4.  Right lower extremity arteriogram 5.  Angioplasty of the right anterior tibial artery 6.  Angioplasty of the right posterior tibial artery and tibial peroneal trunk  SURGEON: Judeth Cornfield. Scot Dock, MD, FACS  ANESTHESIA: Local with sedation  EBL: Minimal  TECHNIQUE: The patient was taken to the peripheral vascular lab and was sedated. The period of conscious sedation was 123 minutes.  During that time period, I was present face-to-face 100% of the time.  The patient was administered 1 mg of Versed and 50 mcg of fentanyl. The patient's heart rate, blood pressure, and oxygen saturation were monitored by the nurse continuously during the procedure.  Both groins were prepped and draped in the usual sterile fashion.  Under ultrasound guidance, after the skin was anesthetized, left common femoral artery was cannulated with a micropuncture needle and a micropuncture sheath introduced over a wire.  This was exchanged for a 5 French sheath over a bursa core wire.  A crossover catheter was then positioned into the right common iliac artery.  The wire was advanced down into the superficial femoral artery and a catheter advanced into the superficial femoral artery.  I then exchanged the bursa core wire for a Rosen wire.  I then exchanged the 5 French sheath for a long 6 [place] Ansell sheath which was positioned into the right superficial femoral artery.  The patient was then heparinized.  ACT was monitored throughout the  procedure.  Right lower extremity arteriogram was obtained.  This demonstrated severe tibial artery occlusive disease.  The only patent artery was the anterior tibial artery which had severe diffuse disease in its mid segment.  I elected to address this with balloon angioplasty.  I was able to cross the lesion using a [place] core wire and the quick cross catheter for support.  Once I got into beyond the stenosis I initially selected a 2 mm x 220 mm balloon which was inflated to nominal pressure for 1 minute.  Completion film showed a good result but still undersized.  I went back with a 3 mm by 150 mm balloon which was inflated in 2 areas to nominal pressure for 1 minute.  Completion film showed an excellent result with a widely patent anterior tibial artery.  Next I decided it would be worth attempting to address the posterior tibial artery.  I selected a V 18 wire and using the quick cross catheter for support was able to eventually get into the posterior tibial artery and get this advanced all the way down to the ankle.  Films showed that I was intraluminal distally.  I then selected initially a 2 mm x 200 cm balloon which was inflated distally to nominal pressure for 1 minute.  It was then blocked brought back to cover the tibial peroneal trunk and posterior tibial artery and was again inflated to nominal pressure for 1 minute.  Completion films showed again that this was undersized and therefore went back with a 3 millimeter by 200 mm balloon which was inflated to  nominal pressure distally for 1 minute.  It was then brought back and inflated again proximally for 1 minute.  Completion films showed an excellent result with two-vessel runoff via the posterior tibial and anterior tibial artery.  Completion film distally showed patency under the foot although there is disease in the dorsalis pedis and posterior tibial in the foot.  The sheath was retracted into the left external iliac artery.  The patient was  transferred to the holding area for removal of the sheath.  No immediate complications were noted.   FINDINGS:   1.  The right common femoral, deep femoral, and superficial femoral arteries are widely patent.  The popliteal artery is patent.  2.  The anterior tibial artery had severe diffuse disease in its mid segment and this was successfully addressed with balloon angioplasty as described above. 3.  The tibial peroneal trunk, peroneal, and posterior tibial arteries were occluded.  I was able to recannulate the tibial peroneal trunk and occluded posterior tibial artery with balloon angioplasty as described above.   [name], MD, FACS [facility]  DATE OF DICTATION:   06/24/2017

## 2017-06-25 ENCOUNTER — Ambulatory Visit: Payer: Medicare Other | Admitting: Internal Medicine

## 2017-06-25 LAB — BASIC METABOLIC PANEL
Anion gap: 6 (ref 5–15)
BUN: 15 mg/dL (ref 6–20)
CHLORIDE: 107 mmol/L (ref 101–111)
CO2: 24 mmol/L (ref 22–32)
CREATININE: 0.89 mg/dL (ref 0.61–1.24)
Calcium: 8.8 mg/dL — ABNORMAL LOW (ref 8.9–10.3)
GFR calc Af Amer: >60 mL/min (ref >60)
GFR calc non Af Amer: >60 mL/min (ref >60)
GLUCOSE: 155 mg/dL — AB (ref 65–99)
POTASSIUM: 4.1 mmol/L (ref 3.5–5.1)
Sodium: 137 mmol/L (ref 135–145)

## 2017-06-25 LAB — HEPARIN LEVEL (UNFRACTIONATED)
Heparin Unfractionated: <0.1 IU/mL — ABNORMAL LOW (ref 0.30–0.70)
Heparin Unfractionated: 0.31 IU/mL (ref 0.30–0.70)

## 2017-06-25 LAB — PROTIME-INR
INR: 1.28
PROTHROMBIN TIME: 15.9 s — AB (ref 11.4–15.2)

## 2017-06-25 LAB — CBC
HEMATOCRIT: 34.8 % — AB (ref 39.0–52.0)
HEMOGLOBIN: 11.6 g/dL — AB (ref 13.0–17.0)
MCH: 32.1 pg (ref 26.0–34.0)
MCHC: 33.3 g/dL (ref 30.0–36.0)
MCV: 96.4 fL (ref 78.0–100.0)
Platelets: 162 10*3/uL (ref 150–400)
RBC: 3.61 MIL/uL — AB (ref 4.22–5.81)
RDW: 13.3 % (ref 11.5–15.5)
WBC: 12.2 10*3/uL — ABNORMAL HIGH (ref 4.0–10.5)

## 2017-06-25 LAB — MAGNESIUM: MAGNESIUM: 1.7 mg/dL (ref 1.7–2.4)

## 2017-06-25 MED ORDER — WARFARIN SODIUM 10 MG PO TABS
10.0000 mg | ORAL_TABLET | Freq: Once | ORAL | Status: AC
  Administered 2017-06-25: 10 mg via ORAL
  Filled 2017-06-25: qty 1

## 2017-06-25 NOTE — Discharge Instructions (Signed)

## 2017-06-25 NOTE — Plan of Care (Signed)
  Health Behavior/Discharge Planning: Ability to manage health-related needs will improve 06/25/2017 2141 - Progressing by Drenda Freeze, RN   Clinical Measurements: Ability to maintain clinical measurements within normal limits will improve 06/25/2017 2141 - Progressing by Drenda Freeze, RN   Clinical Measurements: Will remain free from infection 06/25/2017 2141 - Progressing by Drenda Freeze, RN

## 2017-06-25 NOTE — Care Management Important Message (Signed)
Important Message  Patient Details  Name: Edward Marquez MRN: [id number] Date of Birth: 08/27/1950   Medicare Important Message Given:  Yes    Jatin Naumann [place] 06/25/2017, 11:20 AM

## 2017-06-25 NOTE — Progress Notes (Signed)
ANTICOAGULATION CONSULT NOTE - Follow Up Consult  Pharmacy Consult for HeparinCoumadin Indication: atrial fibrillation and mechanical valve  Allergies  Allergen Reactions  . Ivp Dye [Iodinated Diagnostic Agents] Hives    Patient Measurements: Height: 5\' 9"  (175.3 cm) Weight: 184 lb 15.5 oz (83.9 kg) IBW/kg (Calculated) : 70.7 Heparin Dosing Weight:   Vital Signs: Temp: 98.4 F (36.9 C) (11/13 0423) Temp Source: Oral (11/13 0423) BP: 101/63 (11/13 0423) Pulse Rate: 72 (11/13 0423)  Labs: Recent Labs    06/23/17 0202  06/23/17 1829 06/24/17 0305 06/25/17 0203 06/25/17 0721  HGB  --   --   --  12.6* 11.6*  --   HCT  --   --   --  36.8* 34.8*  --   PLT  --   --   --  171 162  --   LABPROT 21.4*  --  17.0* 16.3* 15.9*  --   INR 1.88  --  1.40 1.32 1.28  --   HEPARINUNFRC  --    < > 0.67 0.48 <0.10* 0.31  CREATININE 0.94  --   --  0.99 0.89  --    < > = values in this interval not displayed.    Estimated Creatinine Clearance: 82.7 mL/min (by C-G formula based on SCr of 0.89 mg/dL).   Assessment:  Anticoag:  heparin for afib. Also has mechanical aortic valve  (target 2.5-3.5) . For vascular procedure (LE angiogram) on 11/12 (left above-knee popliteal to anterior tibial artery bypass graft on 05/03/2017). Coumadin on hold. Heparin resumed post-procedure. HL 0.31 this AM now in goal range. INR 1.28. Hgb 12.6>11.6. Plts stable. -PTA Coumadin noted as 7.5 mg MWFSun, 3.75 mg on TTSat;  [facility] on 11/8 - INR was 3.1  Goal of Therapy:  Heparin level 0.3-0.7 units/ml  INR 2.5-3.5 Monitor platelets by anticoagulation protocol: Yes   Plan:  Increase IV heparin slightly to 1250 units/hr Coumadin 10mg  po x 1 again tonight Daily HL, CBC, and INR   Glessie Eustice S. Alford Highland, PharmD, BCPS Clinical Staff Pharmacist Pager [phone]  Eilene Ghazi Stillinger 06/25/2017,10:48 AM

## 2017-06-25 NOTE — Progress Notes (Addendum)
[facility] [place]  Subjective  - Doing well.   Objective 101/63 72 98.4 F (36.9 C) (Oral) 19 93%  Intake/Output Summary (Last 24 hours) at 06/25/2017 0735 Last data filed at 06/25/2017 0539 Gross per 24 hour  Intake 437.6 ml  Output 250 ml  Net 187.6 ml    Doppler right AT/PT, left DP/PT signals Groin soft without hematoma Dry guaze placed between second and third toes right foot, ban aid placed over heel fisher Heart paced rhythm  Lungs non labored breathing   Assessment/Planning: POD # 1   PROCEDURE:    1.  Ultrasound-guided cannulation of the left common femoral artery 2.  Selective catheterization of the right superficial femoral artery 3.  Conscious sedation 4.  Right lower extremity arteriogram 5.  Angioplasty of the right anterior tibial artery 6.  Angioplasty of the right posterior tibial artery and tibial peroneal trunk  Plan to discharge home once INR is therapeutic Mechanical valve  Laurence Slate [facility] 06/25/2017 7:35 AM --  Laboratory Lab Results: Recent Labs    06/24/17 0305 06/25/17 0203  WBC 7.1 12.2*  HGB 12.6* 11.6*  HCT 36.8* 34.8*  PLT 171 162   BMET Recent Labs    06/24/17 0305 06/25/17 0203  NA 138 137  K 4.3 4.1  CL 106 107  CO2 25 24  GLUCOSE 100* 155*  BUN 15 15  CREATININE 0.99 0.89  CALCIUM 8.8* 8.8*    COAG Lab Results  Component Value Date   INR 1.28 06/25/2017   INR 1.32 06/24/2017   INR 1.40 06/23/2017   No results found for: PTT  I have interviewed the patient and examined the patient. I agree with the findings by the PA. Brisk Doppler signals both feet. Home when Coumadin therapeutic.  Gae Gallop, MD [phone]

## 2017-06-26 LAB — HEPARIN LEVEL (UNFRACTIONATED): Heparin Unfractionated: 0.31 IU/mL (ref 0.30–0.70)

## 2017-06-26 LAB — BASIC METABOLIC PANEL
Anion gap: 5 (ref 5–15)
BUN: 23 mg/dL — AB (ref 6–20)
CALCIUM: 8.7 mg/dL — AB (ref 8.9–10.3)
CO2: 23 mmol/L (ref 22–32)
CREATININE: 0.99 mg/dL (ref 0.61–1.24)
Chloride: 109 mmol/L (ref 101–111)
GFR calc non Af Amer: >60 mL/min (ref >60)
GLUCOSE: 124 mg/dL — AB (ref 65–99)
Potassium: 4.3 mmol/L (ref 3.5–5.1)
SODIUM: 137 mmol/L (ref 135–145)

## 2017-06-26 LAB — CBC
HCT: 34.3 % — ABNORMAL LOW (ref 39.0–52.0)
Hemoglobin: 11.3 g/dL — ABNORMAL LOW (ref 13.0–17.0)
MCH: 32.5 pg (ref 26.0–34.0)
MCHC: 32.9 g/dL (ref 30.0–36.0)
MCV: 98.6 fL (ref 78.0–100.0)
PLATELETS: 153 10*3/uL (ref 150–400)
RBC: 3.48 MIL/uL — AB (ref 4.22–5.81)
RDW: 13.9 % (ref 11.5–15.5)
WBC: 10.9 10*3/uL — ABNORMAL HIGH (ref 4.0–10.5)

## 2017-06-26 LAB — MAGNESIUM: MAGNESIUM: 2.1 mg/dL (ref 1.7–2.4)

## 2017-06-26 LAB — PROTIME-INR
INR: 1.74
Prothrombin Time: 20.2 seconds — ABNORMAL HIGH (ref 11.4–15.2)

## 2017-06-26 MED ORDER — WARFARIN SODIUM 7.5 MG PO TABS
7.5000 mg | ORAL_TABLET | Freq: Once | ORAL | Status: AC
  Administered 2017-06-26: 7.5 mg via ORAL
  Filled 2017-06-26: qty 1

## 2017-06-26 NOTE — Care Management Note (Signed)
Case Management Note [name] RN, BSN Unit 4E-Case Manager [phone]  Patient Details  Name: Edward Marquez MRN: [id number] Date of Birth: [birth year]  Subjective/Objective:  Pt admitted with PAD- s/p: PTA of right ant- tibial artery and post-tibial artery. Per MD note- Cellulitis has improved significantly.  Continue Ancef while he is in the hospital then plan to be discharged on po doxycycline.  Home when Coumadin therapeutic.  goal 2.5.                    Action/Plan: PTA pt lived at home - plan to d/c home- CM to follow- no anticipated d/c needs  Expected Discharge Date:                  Expected Discharge Plan:  Home/Self Care  In-House Referral:     Discharge planning Services  CM Consult  Post Acute Care Choice:  NA Choice offered to:  NA  DME Arranged:    DME Agency:     HH Arranged:    [place] Agency:     Status of Service:  In process, will continue to follow  If discussed at Long Length of Stay Meetings, dates discussed:    Discharge Disposition:   Additional Comments:  [name], RN 06/26/2017, 12:39 PM

## 2017-06-26 NOTE — Progress Notes (Signed)
ANTICOAGULATION CONSULT NOTE - Follow Up Consult  Pharmacy Consult for Heparin/Coumadin Indication: atrial fibrillation and mechanical valve  Allergies  Allergen Reactions  . Ivp Dye [Iodinated Diagnostic Agents] Hives    Patient Measurements: Height: 5\' 9"  (175.3 cm) Weight: 184 lb 15.5 oz (83.9 kg) IBW/kg (Calculated) : 70.7 Heparin Dosing Weight:   Vital Signs: Temp: 97.6 F (36.4 C) (11/14 0707) Temp Source: Oral (11/14 0707) BP: 121/76 (11/14 0707) Pulse Rate: 70 (11/14 0707)  Labs: Recent Labs    06/24/17 0305 06/25/17 0203 06/25/17 0721 06/26/17 0234  HGB 12.6* 11.6*  --  11.3*  HCT 36.8* 34.8*  --  34.3*  PLT 171 162  --  153  LABPROT 16.3* 15.9*  --  20.2*  INR 1.32 1.28  --  1.74  HEPARINUNFRC 0.48 <0.10* 0.31 0.31  CREATININE 0.99 0.89  --  0.99    Estimated Creatinine Clearance: 74.4 mL/min (by C-G formula based on SCr of 0.99 mg/dL).   Assessment:  Anticoag:  heparin for afib. Also has mechanical aortic valve  (target 2.5-3.5) . For vascular procedure (LE angiogram) on 11/12 (left above-knee popliteal to anterior tibial artery bypass graft on 05/03/2017). Heparin resumed post-procedure. HL 0.31 this AM still in goal range. INR 1.74 (Goal 2.5-3.5). Hgb 12.6>11.3. Plts stable.  -PTA Coumadin noted as 7.5 mg MWFSun, 3.75 mg on TTSat;  [facility]pt AC [facility] at [facility] on 11/8 - INR was 3.1  Goal of Therapy:  Heparin level 0.3-0.7 units/ml  INR 2.5-3.5 Monitor platelets by anticoagulation protocol: Yes   Plan:  Continue IV heparin at 1250 units/hr Coumadin 7.5mg  po x 1 tonight Daily HL, CBC, and INR   Charlsie Fleeger S. Alford Highland, PharmD, BCPS Clinical Staff Pharmacist Pager [phone]  [place], [place] 06/26/2017,8:34 AM

## 2017-06-26 NOTE — Progress Notes (Signed)
   VASCULAR SURGERY ASSESSMENT & PLAN:   2 Days Post-Op s/p: PTA of right anterior tibial artery and posterior tibial artery.  Cellulitis has improved significantly.  Continue Ancef while he is in the hospital then he can be discharged on po doxycycline which she was on before.  Home when Coumadin therapeutic.  INR today was 1.74.  SUBJECTIVE:   No complaints.  PHYSICAL EXAM:   Vitals:   06/24/17 2131 06/25/17 0423 06/25/17 1500 06/25/17 2016  BP: (!) 91/53 101/63 100/79 110/76  Pulse:  72 70 70  Resp:  19 19 20   Temp:  98.4 F (36.9 C) (!) 97.5 F (36.4 C) 98.5 F (36.9 C)  TempSrc:  Oral Oral Oral  SpO2:  93% 100% 100%  Weight:      Height:       Cellulitis of the right foot has improved. Both feet are warm and well perfused.  LABS:   Lab Results  Component Value Date   WBC 10.9 (H) 06/26/2017   HGB 11.3 (L) 06/26/2017   HCT 34.3 (L) 06/26/2017   MCV 98.6 06/26/2017   PLT 153 06/26/2017   Lab Results  Component Value Date   CREATININE 0.99 06/26/2017   Lab Results  Component Value Date   INR 1.74 06/26/2017     PROBLEM LIST:    Active Problems:   PAD (peripheral artery disease) (HCC)   CURRENT MEDS:   . aspirin EC  81 mg Oral Daily  . diltiazem  120 mg Oral BID  . dofetilide  500 mcg Oral BID  . metoprolol succinate  100 mg Oral Daily  . ramipril  2.5 mg Oral Daily  . sodium chloride flush  3 mL Intravenous Q12H  . Warfarin - Pharmacist Dosing Inpatient   Does not apply [place]: [phone] Office: [phone] 06/26/2017

## 2017-06-27 ENCOUNTER — Telehealth: Payer: Self-pay | Admitting: Vascular Surgery

## 2017-06-27 LAB — CBC
HCT: 32.7 % — ABNORMAL LOW (ref 39.0–52.0)
Hemoglobin: 10.7 g/dL — ABNORMAL LOW (ref 13.0–17.0)
MCH: 32.1 pg (ref 26.0–34.0)
MCHC: 32.7 g/dL (ref 30.0–36.0)
MCV: 98.2 fL (ref 78.0–100.0)
PLATELETS: 139 10*3/uL — AB (ref 150–400)
RBC: 3.33 MIL/uL — ABNORMAL LOW (ref 4.22–5.81)
RDW: 13.7 % (ref 11.5–15.5)
WBC: 7.7 10*3/uL (ref 4.0–10.5)

## 2017-06-27 LAB — BASIC METABOLIC PANEL
Anion gap: 4 — ABNORMAL LOW (ref 5–15)
BUN: 19 mg/dL (ref 6–20)
CO2: 27 mmol/L (ref 22–32)
Calcium: 8.7 mg/dL — ABNORMAL LOW (ref 8.9–10.3)
Chloride: 108 mmol/L (ref 101–111)
Creatinine, Ser: 0.84 mg/dL (ref 0.61–1.24)
GFR calc Af Amer: >60 mL/min (ref >60)
GLUCOSE: 95 mg/dL (ref 65–99)
POTASSIUM: 4.4 mmol/L (ref 3.5–5.1)
Sodium: 139 mmol/L (ref 135–145)

## 2017-06-27 LAB — HEPARIN LEVEL (UNFRACTIONATED): HEPARIN UNFRACTIONATED: 0.39 [IU]/mL (ref 0.30–0.70)

## 2017-06-27 LAB — PROTIME-INR
INR: 2.18
PROTHROMBIN TIME: 24.1 s — AB (ref 11.4–15.2)

## 2017-06-27 LAB — MAGNESIUM: Magnesium: 1.8 mg/dL (ref 1.7–2.4)

## 2017-06-27 MED ORDER — WARFARIN SODIUM 7.5 MG PO TABS: 7.5000 mg | ORAL_TABLET | Freq: Once | ORAL | Status: DC

## 2017-06-27 MED ORDER — OXYCODONE HCL 5 MG PO TABS: 5.0000 mg | ORAL_TABLET | ORAL | 0 refills | Status: DC | PRN

## 2017-06-27 NOTE — Progress Notes (Signed)
ANTICOAGULATION CONSULT NOTE - Follow Up Consult  Pharmacy Consult for Heparin/Coumadin Indication: atrial fibrillation and mechanical valve  Allergies  Allergen Reactions  . Ivp Dye [Iodinated Diagnostic Agents] Hives    Patient Measurements: Height: 5\' 9"  (175.3 cm) Weight: 184 lb 15.5 oz (83.9 kg) IBW/kg (Calculated) : 70.7 Heparin Dosing Weight:   Vital Signs: Temp: 98.3 F (36.8 C) (11/15 0416) Temp Source: Oral (11/15 0416) BP: 91/53 (11/15 0415) Pulse Rate: 67 (11/15 0415)  Labs: Recent Labs    06/25/17 0203 06/25/17 0721 06/26/17 0234 06/27/17 0401  HGB 11.6*  --  11.3* 10.7*  HCT 34.8*  --  34.3* 32.7*  PLT 162  --  153 139*  LABPROT 15.9*  --  20.2* 24.1*  INR 1.28  --  1.74 2.18  HEPARINUNFRC <0.10* 0.31 0.31 0.39  CREATININE 0.89  --  0.99 0.84    Estimated Creatinine Clearance: 87.7 mL/min (by C-G formula based on SCr of 0.84 mg/dL).   Assessment:   Anticoag:  heparin for afib. Also has mechanical aortic valve  (target 2.5-3.5) . For vascular procedure (LE angiogram) on 11/12 (left above-knee popliteal to anterior tibial artery bypass graft on 05/03/2017). Heparin resumed post-procedure. HL 0.39 this AM still in goal range. INR 2.18. Hgb 12.6>11.3>10.7. Plts 139.  -PTA Coumadin noted as 7.5 mg MWFSun, 3.75 mg on TTSat;  Outpt AC visit at [facility] on 11/8 - INR was 3.1  Goal of Therapy:  Heparin level 0.3-0.7 units/ml  INR 2.5-3.5 Monitor platelets by anticoagulation protocol: Yes   Plan:  Continue IV heparin at 1250 units/hr until discharge Coumadin 7.5mg  po x 1 tonight then would resume previous home regimen Daily HL, CBC, and INR   Kaniah Rizzolo S. Alford Highland, PharmD, [facility] Clinical Staff Pharmacist Pager [phone]  Eilene Ghazi [place]illing[place] 06/27/2017,10:00 AM

## 2017-06-27 NOTE — Care Management Note (Signed)
Case Management Note [name] RN, BSN Unit 4E-Case Manager [phone]  Patient Details  Name: Edward Marquez MRN: [id number] Date of Birth: Jul 19, 1951  Subjective/Objective:  Pt admitted with PAD- s/p: PTA of right ant- tibial artery and post-tibial artery. Per MD note- Cellulitis has improved significantly.  Continue Ancef while he is in the hospital then plan to be discharged on po doxycycline.  Home when Coumadin therapeutic.  goal 2.5.                    Action/Plan: PTA pt lived at home - plan to d/c home- CM to follow- no anticipated d/c needs  Expected Discharge Date:  06/27/17               Expected Discharge Plan:  Home/Self Care  In-House Referral:  NA  Discharge planning Services  CM Consult  Post Acute Care Choice:  NA Choice offered to:  NA  DME Arranged:    DME Agency:     HH Arranged:    [place] Agency:     Status of Service:  Completed, signed off  If discussed at [employer] of Stay Meetings, dates discussed:    Discharge Disposition: home/self care   Additional Comments:  06/27/17- 1030- [name] RN, CM- pt for d/c home today- no CM needs noted for discharge.   [name], RN 06/27/2017, 10:38 AM

## 2017-06-27 NOTE — Telephone Encounter (Signed)
Sched lab 07/16/17 at 10:00 and MD 07/24/17 at 9:15. Lm on hm#.

## 2017-06-27 NOTE — Progress Notes (Signed)
[facility] [place]  Subjective  - Doing well, right foot wound appears to be healing.   Objective (!) 91/53 67 98.3 F (36.8 C) (Oral) 12 95%  Intake/Output Summary (Last 24 hours) at 06/27/2017 0800 Last data filed at 06/27/2017 0700 Gross per 24 hour  Intake 312.5 ml  Output -  Net 312.5 ml    Doppler right AT/PT, left DP/PT signals Guaze between toes, has darco shoe for heel weight bearing Heart paced rhythm Lungs non labored breathing    Assessment/Planning: POD #3 1.  Ultrasound-guided cannulation of the left common femoral artery 2.  Selective catheterization of the right superficial femoral artery 3.  Conscious sedation 4.  Right lower extremity arteriogram 5.  Angioplasty of the right anterior tibial artery 6.  Angioplasty of the right posterior tibial artery and tibial peroneal trunk  Coumadin INR therapeutic 2.18 has mechanical valve He has follow appointment for INR check as an out patient tomorrow.  F/U with Dr. Scot Dock in 2 weeks will order ABI  Keland Peyton, [place] 06/27/2017 8:00 AM --  Laboratory Lab Results: Recent Labs    06/26/17 0234 06/27/17 0401  WBC 10.9* 7.7  HGB 11.3* 10.7*  HCT 34.3* 32.7*  PLT 153 139*   BMET Recent Labs    06/26/17 0234 06/27/17 0401  NA 137 139  K 4.3 4.4  CL 109 108  CO2 23 27  GLUCOSE 124* 95  BUN 23* 19  CREATININE 0.99 0.84  CALCIUM 8.7* 8.7*    COAG Lab Results  Component Value Date   INR 2.18 06/27/2017   INR 1.74 06/26/2017   INR 1.28 06/25/2017   No results found for: PTT

## 2017-06-27 NOTE — Telephone Encounter (Signed)
-----   Message from Mena Goes, RN sent at 06/27/2017  9:41 AM EST ----- Regarding: 2 weeks postop w/ ABIs   ----- Message ----- From: Ulyses Amor, PA-C Sent: 06/27/2017   8:13 AM To: Vvs Charge Pool  F/U with DR. Dickson in 2 weeks needs ABI s/p right angioplasty

## 2017-06-27 NOTE — Progress Notes (Signed)
Order received to discharge patient.  Telemetry monitor removed and CCMD notified.  PIV access removed.  Discharge instructions, follow up, medications and instructions for their use discussed with patient. 

## 2017-06-28 ENCOUNTER — Other Ambulatory Visit: Payer: Self-pay

## 2017-06-28 DIAGNOSIS — I739 Peripheral vascular disease, unspecified: Secondary | ICD-10-CM

## 2017-06-28 DIAGNOSIS — Z48812 Encounter for surgical aftercare following surgery on the circulatory system: Secondary | ICD-10-CM

## 2017-06-28 NOTE — Discharge Summary (Signed)
[facility] Discharge Summary   Patient ID:  Jden Want MRN: [id number] DOB/AGE: 10-14-1950 66 y.o.  Admit date: 06/21/2017 Discharge date: 06/27/2017 Date of Surgery: 06/24/2017 Surgeon: Surgeon(s): Angelia Mould, MD  Admission Diagnosis: pvd  Discharge Diagnoses:  pvd  Secondary Diagnoses: Past Medical History:  Diagnosis Date  . Aortic valve defect   . Atrial fibrillation ([place])   . CHF (congestive heart failure) ([place])   . Gout   . Hypertension   . Peripheral vascular disease ([place])     Procedure(s): LOWER EXTREMITY ANGIOGRAPHY - Right PERIPHERAL VASCULAR BALLOON ANGIOPLASTY  Discharged Condition: good  HPI: Randen Kauth is a pleasant 66 y.o. 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  He presented with a nonhealing wound along the anterior lateral left leg.  He has a history of aortic valve replacement and is on chronic Coumadin therapy.  He was admitted with cellulitis and treated with intravenous antibiotics.  His Coumadin was held.  On 05/03/2017, he underwent a left above-knee popliteal to anterior tibial artery bypass with a vein graft.  We kept him in the hospital until his Coumadin was therapeutic.  He returns for his first outpatient visit.  Since I saw him last, he is developed cellulitis on his right foot and a wound between his second and third toes.  He denies fever.  He has been seen in the wound care center and has been on doxycycline.  He is on Coumadin  he does have a history of an aortic valve replacement.  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  His surgery was done at [facility].  He has had some swelling in the left leg after his bypass.     Hospital Course:  Ketan Renz is a 66 y.o. male is S/P  Procedure(s): LOWER EXTREMITY ANGIOGRAPHY - Right PERIPHERAL VASCULAR BALLOON ANGIOPLASTY   Doppler right AT/PT, left DP/PT signals Groin soft without hematoma Dry guaze placed between  second and third toes right foot, ban aid placed over heel fisher  POD#3 Coumadin INR therapeutic 2.18 has mechanical valve He has follow appointment for INR check as an out patient tomorrow.  F/U with Dr. Scot Dock in 2 weeks will order ABI  Disposition stable  Significant Diagnostic Studies: CBC Lab Results  Component Value Date   WBC 7.7 06/27/2017   HGB 10.7 (L) 06/27/2017   HCT 32.7 (L) 06/27/2017   MCV 98.2 06/27/2017   PLT 139 (L) 06/27/2017    BMET    Component Value Date/Time   NA 139 06/27/2017 0401   K 4.4 06/27/2017 0401   CL 108 06/27/2017 0401   CO2 27 06/27/2017 0401   GLUCOSE 95 06/27/2017 0401   BUN 19 06/27/2017 0401   CREATININE 0.84 06/27/2017 0401   CALCIUM 8.7 (L) 06/27/2017 0401   GFRNONAA >60 06/27/2017 0401   GFRAA >60 06/27/2017 0401   COAG Lab Results  Component Value Date   INR 2.18 06/27/2017   INR 1.74 06/26/2017   INR 1.28 06/25/2017     Disposition:  Discharge to :Home Discharge Instructions    Call MD for:  redness, tenderness, or signs of infection (pain, swelling, bleeding, redness, odor or green/yellow discharge around incision site)   Complete by:  As directed    Call MD for:  severe or increased pain, loss or decreased feeling  in affected limb(s)   Complete by:  As directed    Call MD for:  temperature >100.5   Complete by:  As directed    Discharge instructions   Complete by:  As directed    You may shower daily.  Dial soap soaks right foot with guaze or cotton between the second and third toes.   Increase activity slowly   Complete by:  As directed    Walk with assistance use walker or cane as needed   Resume previous diet   Complete by:  As directed      Allergies as of 06/27/2017      Reactions   Ivp Dye [iodinated Diagnostic Agents] Hives      Medication List    STOP taking these medications   doxycycline 100 MG capsule Commonly known as:  VIBRAMYCIN   enoxaparin 80 MG/0.8ML injection Commonly known  as:  LOVENOX     TAKE these medications   diltiazem 120 MG 24 hr capsule Commonly known as:  CARDIZEM CD Take 120 mg 2 (two) times daily by mouth.   dofetilide 500 MCG capsule Commonly known as:  TIKOSYN Take 500 mcg by mouth 2 (two) times daily.   ibuprofen 200 MG tablet Commonly known as:  ADVIL,MOTRIN Take 400 mg every 6 (six) hours as needed by mouth for headache or moderate pain.   IBUPROFEN PM 200-25 MG Caps Generic drug:  Ibuprofen-Diphenhydramine HCl Take 2 tablets at bedtime as needed by mouth (sleep).   metoprolol succinate 100 MG 24 hr tablet Commonly known as:  TOPROL-XL Take 100 mg by mouth daily. Take with or immediately following a meal.   mupirocin ointment 2 % Commonly known as:  BACTROBAN Apply 1 application topically 2 (two) times daily.   oxyCODONE 5 MG immediate release tablet Commonly known as:  Oxy IR/ROXICODONE Take 1-2 tablets (5-10 mg total) every 4 (four) hours as needed by mouth for moderate pain.   oxyCODONE-acetaminophen 5-325 MG tablet Commonly known as:  PERCOCET/ROXICET Take 1 tablet by mouth every 6 (six) hours as needed for moderate pain.   ramipril 2.5 MG capsule Commonly known as:  ALTACE Take 2.5 mg by mouth daily.   warfarin 7.5 MG tablet Commonly known as:  COUMADIN Take 3.75-7.5 mg See admin instructions by mouth. Take 7.5 mg by mouth daily in the evening except Tue and Sat take 3.75 mg daily in the evening      Verbal and written Discharge instructions given to the patient. Wound care per Discharge AVS Follow-up Information    Angelia Mould, MD Follow up in 2 week(s).   Specialties:  Vascular Surgery, Cardiology Why:  office will call Contact information: [address] [place] [address] [phone]           Signed: Roxy Horseman 06/28/2017, 10:46 AM

## 2017-07-02 DIAGNOSIS — I4891 Unspecified atrial fibrillation: Secondary | ICD-10-CM | POA: Diagnosis not present

## 2017-07-02 DIAGNOSIS — Z5181 Encounter for therapeutic drug level monitoring: Secondary | ICD-10-CM | POA: Diagnosis not present

## 2017-07-02 DIAGNOSIS — Z952 Presence of prosthetic heart valve: Secondary | ICD-10-CM | POA: Diagnosis not present

## 2017-07-02 DIAGNOSIS — Z7901 Long term (current) use of anticoagulants: Secondary | ICD-10-CM | POA: Diagnosis not present

## 2017-07-09 DIAGNOSIS — Z7901 Long term (current) use of anticoagulants: Secondary | ICD-10-CM | POA: Diagnosis not present

## 2017-07-09 DIAGNOSIS — Z5181 Encounter for therapeutic drug level monitoring: Secondary | ICD-10-CM | POA: Diagnosis not present

## 2017-07-09 DIAGNOSIS — I4891 Unspecified atrial fibrillation: Secondary | ICD-10-CM | POA: Diagnosis not present

## 2017-07-09 DIAGNOSIS — Z952 Presence of prosthetic heart valve: Secondary | ICD-10-CM | POA: Diagnosis not present

## 2017-07-16 ENCOUNTER — Ambulatory Visit (HOSPITAL_COMMUNITY)
Admission: RE | Admit: 2017-07-16 | Discharge: 2017-07-16 | Disposition: A | Discharge status: Home / Self Care | Payer: Medicare Other | Source: Ambulatory Visit | Attending: Vascular Surgery | Admitting: Vascular Surgery

## 2017-07-16 DIAGNOSIS — R9439 Abnormal result of other cardiovascular function study: Secondary | ICD-10-CM | POA: Diagnosis not present

## 2017-07-16 DIAGNOSIS — I739 Peripheral vascular disease, unspecified: Secondary | ICD-10-CM | POA: Diagnosis not present

## 2017-07-16 DIAGNOSIS — Z48812 Encounter for surgical aftercare following surgery on the circulatory system: Secondary | ICD-10-CM | POA: Insufficient documentation

## 2017-07-17 DIAGNOSIS — I4891 Unspecified atrial fibrillation: Secondary | ICD-10-CM | POA: Diagnosis not present

## 2017-07-17 DIAGNOSIS — I480 Paroxysmal atrial fibrillation: Secondary | ICD-10-CM | POA: Diagnosis not present

## 2017-07-17 DIAGNOSIS — Z952 Presence of prosthetic heart valve: Secondary | ICD-10-CM | POA: Diagnosis not present

## 2017-07-17 DIAGNOSIS — Z5181 Encounter for therapeutic drug level monitoring: Secondary | ICD-10-CM | POA: Diagnosis not present

## 2017-07-17 DIAGNOSIS — I420 Dilated cardiomyopathy: Secondary | ICD-10-CM | POA: Diagnosis not present

## 2017-07-17 DIAGNOSIS — Z7901 Long term (current) use of anticoagulants: Secondary | ICD-10-CM | POA: Diagnosis not present

## 2017-07-18 DIAGNOSIS — Z5181 Encounter for therapeutic drug level monitoring: Secondary | ICD-10-CM | POA: Diagnosis not present

## 2017-07-18 DIAGNOSIS — Z7901 Long term (current) use of anticoagulants: Secondary | ICD-10-CM | POA: Diagnosis not present

## 2017-07-18 DIAGNOSIS — Z952 Presence of prosthetic heart valve: Secondary | ICD-10-CM | POA: Diagnosis not present

## 2017-07-18 DIAGNOSIS — I4891 Unspecified atrial fibrillation: Secondary | ICD-10-CM | POA: Diagnosis not present

## 2017-07-24 ENCOUNTER — Encounter: Payer: Self-pay | Admitting: Vascular Surgery

## 2017-07-24 ENCOUNTER — Ambulatory Visit: Payer: Self-pay | Admitting: Vascular Surgery

## 2017-07-24 ENCOUNTER — Ambulatory Visit: Payer: Medicare Other | Admitting: Vascular Surgery

## 2017-07-24 VITALS — BP 131/81 | HR 82 | Temp 97.1°F | Resp 16 | Ht 69.0 in | Wt 188.0 lb

## 2017-07-24 DIAGNOSIS — Z48812 Encounter for surgical aftercare following surgery on the circulatory system: Secondary | ICD-10-CM

## 2017-07-24 DIAGNOSIS — I739 Peripheral vascular disease, unspecified: Secondary | ICD-10-CM

## 2017-07-24 NOTE — Progress Notes (Signed)
Patient name: Edward Marquez MRN: [id number] DOB: Apr 23, 1951 Sex: male  REASON FOR VISIT:   Follow-up  HPI:   Edward Marquez is a pleasant 66 y.o. male who presented with cellulitis of the right foot.  He underwent an arteriogram on 06/24/2017 and had successful angioplasty of the right anterior tibial artery and right posterior tibial artery and tibial peroneal trunk.  He was restarted on his Coumadin postoperatively and returns for outpatient visit.  He was discharged on doxycycline.  Of note, he had previously undergone a left above-knee popliteal to anterior tibial artery bypass with a non-reversed vein graft.  That was on 05/03/2017.  He has no specific complaints today.  The wound between the second and third toes of the right foot has not completely healed but is getting smaller.  Current Outpatient Medications  Medication Sig Dispense Refill  . diltiazem (CARDIZEM CD) 120 MG 24 hr capsule Take 120 mg 2 (two) times daily by mouth.     . dofetilide (TIKOSYN) 500 MCG capsule Take 500 mcg by mouth 2 (two) times daily.     Marland Kitchen ibuprofen (ADVIL,MOTRIN) 200 MG tablet Take 400 mg every 6 (six) hours as needed by mouth for headache or moderate pain.    . Ibuprofen-Diphenhydramine HCl (IBUPROFEN PM) 200-25 MG CAPS Take 2 tablets at bedtime as needed by mouth (sleep).    . metoprolol succinate (TOPROL-XL) 100 MG 24 hr tablet Take 100 mg by mouth daily. Take with or immediately following a meal.    . mupirocin ointment (BACTROBAN) 2 % Apply 1 application topically 2 (two) times daily. 22 g 0  . oxyCODONE (OXY IR/ROXICODONE) 5 MG immediate release tablet Take 1-2 tablets (5-10 mg total) every 4 (four) hours as needed by mouth for moderate pain. 30 tablet 0  . oxyCODONE-acetaminophen (PERCOCET/ROXICET) 5-325 MG tablet Take 1 tablet by mouth every 6 (six) hours as needed for moderate pain. 30 tablet 0  . ramipril (ALTACE) 2.5 MG capsule Take 2.5 mg by mouth daily.    Marland Kitchen warfarin (COUMADIN) 7.5 MG tablet  Take 3.75-7.5 mg See admin instructions by mouth. Take 7.5 mg by mouth daily in the evening except Tue and Sat take 3.75 mg daily in the evening     No current facility-administered medications for this visit.     REVIEW OF SYSTEMS:  [X]  denotes positive finding, [ ]  denotes negative finding Cardiac  Comments:  Chest pain or chest pressure:    Shortness of breath upon exertion:    Short of breath when lying flat:    Irregular heart rhythm: X   Constitutional    Fever or chills:     PHYSICAL EXAM:   Vitals:   07/24/17 0951  BP: 131/81  Pulse: 82  Resp: 16  Temp: (!) 97.1 F (36.2 C)  TempSrc: Oral  SpO2: 99%  Weight: 188 lb (85.3 kg)  Height: 5\' 9"  (1.753 m)    GENERAL: The patient is a well-nourished male, in no acute distress. The vital signs are documented above. CARDIOVASCULAR: There is a regular rate and rhythm. PULMONARY: There is good air exchange bilaterally without wheezing or rales. Swelling in the left leg has improved. He has brisk barely biphasic dorsalis pedis signals bilaterally. The wound between the right second and third toes is very small and has almost healed.  DATA:   ARTERIAL DOPPLER STUDY: I reviewed the arterial Doppler study that was done on 07/16/2017.  On the right side there is a monophasic dorsalis pedis and posterior  tibial signal with an ABI of 65%.  Toe pressure on the right is 26 mmHg.    On the left side there is a biphasic posterior tibial signal with a monophasic dorsalis pedis signal.  The arteries are noncompressible and an ABI could not be obtained.  Toe pressure on the left is 46 mmHg.  MEDICAL ISSUES:   STATUS POST TIBIAL ANGIOPLASTY RIGHT LOWER EXTREMITY AND LEFT POPLITEAL ANTERIOR TIBIAL BYPASS: The patient is doing well status post the above procedures.  He has a palpable popliteal pulse on the left and his bypass graft is patent.  He has a brisk anterior tibial signal on the right and the toe wound is healing.  I will see him  back in 3 months with ABIs, a duplex of his bypass graft, and a duplex of his right lower extremity where he had angioplasty.  He knows to call sooner if he has problems.  [name] [facility] [phone]

## 2017-07-25 DIAGNOSIS — Z95810 Presence of automatic (implantable) cardiac defibrillator: Secondary | ICD-10-CM | POA: Diagnosis not present

## 2017-07-25 NOTE — Addendum Note (Signed)
Addended by: Lianne Cure A on: 07/25/2017 12:36 PM   Modules accepted: Orders

## 2017-07-26 DIAGNOSIS — Z7901 Long term (current) use of anticoagulants: Secondary | ICD-10-CM | POA: Diagnosis not present

## 2017-07-26 DIAGNOSIS — Z952 Presence of prosthetic heart valve: Secondary | ICD-10-CM | POA: Diagnosis not present

## 2017-07-26 DIAGNOSIS — I4891 Unspecified atrial fibrillation: Secondary | ICD-10-CM | POA: Diagnosis not present

## 2017-07-26 DIAGNOSIS — Z5181 Encounter for therapeutic drug level monitoring: Secondary | ICD-10-CM | POA: Diagnosis not present

## 2017-08-01 DIAGNOSIS — I480 Paroxysmal atrial fibrillation: Secondary | ICD-10-CM | POA: Diagnosis not present

## 2017-08-01 DIAGNOSIS — Z95810 Presence of automatic (implantable) cardiac defibrillator: Secondary | ICD-10-CM | POA: Diagnosis not present

## 2017-08-01 DIAGNOSIS — I4891 Unspecified atrial fibrillation: Secondary | ICD-10-CM | POA: Diagnosis not present

## 2017-08-01 DIAGNOSIS — Z7901 Long term (current) use of anticoagulants: Secondary | ICD-10-CM | POA: Diagnosis not present

## 2017-08-02 DIAGNOSIS — I499 Cardiac arrhythmia, unspecified: Secondary | ICD-10-CM | POA: Diagnosis not present

## 2017-08-14 ENCOUNTER — Encounter: Payer: Self-pay | Admitting: Family Medicine

## 2017-08-14 ENCOUNTER — Ambulatory Visit (INDEPENDENT_AMBULATORY_CARE_PROVIDER_SITE_OTHER): Payer: Medicare Other | Admitting: Family Medicine

## 2017-08-14 ENCOUNTER — Other Ambulatory Visit: Payer: Self-pay

## 2017-08-14 VITALS — BP 110/64 | HR 86 | Temp 98.1°F | Resp 16 | Ht 67.72 in | Wt 190.0 lb

## 2017-08-14 DIAGNOSIS — Z1211 Encounter for screening for malignant neoplasm of colon: Secondary | ICD-10-CM | POA: Diagnosis not present

## 2017-08-14 DIAGNOSIS — I739 Peripheral vascular disease, unspecified: Secondary | ICD-10-CM | POA: Diagnosis not present

## 2017-08-14 DIAGNOSIS — Z95810 Presence of automatic (implantable) cardiac defibrillator: Secondary | ICD-10-CM

## 2017-08-14 DIAGNOSIS — I482 Chronic atrial fibrillation, unspecified: Secondary | ICD-10-CM

## 2017-08-14 DIAGNOSIS — Z0000 Encounter for general adult medical examination without abnormal findings: Secondary | ICD-10-CM | POA: Diagnosis not present

## 2017-08-14 DIAGNOSIS — Z125 Encounter for screening for malignant neoplasm of prostate: Secondary | ICD-10-CM | POA: Diagnosis not present

## 2017-08-14 DIAGNOSIS — I4891 Unspecified atrial fibrillation: Secondary | ICD-10-CM | POA: Insufficient documentation

## 2017-08-14 DIAGNOSIS — L570 Actinic keratosis: Secondary | ICD-10-CM | POA: Diagnosis not present

## 2017-08-14 DIAGNOSIS — Z1159 Encounter for screening for other viral diseases: Secondary | ICD-10-CM

## 2017-08-14 DIAGNOSIS — Z23 Encounter for immunization: Secondary | ICD-10-CM | POA: Diagnosis not present

## 2017-08-14 NOTE — Assessment & Plan Note (Signed)
Reviewed last vascular note, hopefully as small ulcer continues to heal the mild neopathic pain will resolve Note he is not on statin drug, return for fasting labs

## 2017-08-14 NOTE — Assessment & Plan Note (Signed)
Per cardiology  on coumadin 

## 2017-08-14 NOTE — Progress Notes (Signed)
Subjective:    Patient ID: Edward Marquez, male    DOB: 17-Dec-1950, 67 y.o.   MRN: [id number]  Patient presents for [facility] (is not fasting)  Here to establish care- no previous PCP in past 10 years, follows mostly with cardiologist   History Reviewed  He is followed by cardiology wait for secondary to history of official heart valve replacement in 2000 he is on anticoagulation for this he also has atrial fibrillation.  Dr. Ola Spurr with [facility]  Also had a  ICD/pacemaker placed in 2013 He is on chronic coumadin and tikasyn Rate controlling medication cardizem, topol    He is also followed by vascular Dr. Doren Custard secondary to chronic venous insufficiency and peripheral arterial disease.  He has had ulceration treated in his left leg and right foot. He has healing ulcer between 2nd and 3rd digits of right foot, still has some tingling at the base -has had Fem-Pop bypass ON LEFT side and angioplasty on rright  Gout- occurred 20 years ago , no difficulties since he had PAD/PVD looked at   Colonoscopy- done > 10 years ago PSA- had lab > 1 year, no difficulty with urine stream  Immunizations- due for TDAP, Prevnar 13, shingles  He has a spot on his right cheek, scaley present > 1 year, does bleed with shaving, has scaley spots on scalp and forehead, most of life was out in sun, Retired from [employer]  He is married, no children, stays active, rides horses  Family history- distant uncle with DM,  No hearing or vision difficulties Follows with dentist  Depression screen- PHQ score 2  No falls   Review Of Systems:  GEN- denies fatigue, fever, weight loss,weakness, recent illness HEENT- denies eye drainage, change in vision, nasal discharge, CVS- denies chest pain, palpitations RESP- denies SOB, cough, wheeze ABD- denies N/V, change in stools, abd pain GU- denies dysuria, hematuria, dribbling, incontinence MSK- denies joint pain, muscle aches,  injury Neuro- denies headache, dizziness, syncope, seizure activity       Objective:    BP 110/64   Pulse 86   Temp 98.1 F (36.7 C) (Oral)   Resp 16   Ht 5' 7.72" (1.72 m)   Wt 190 lb (86.2 kg)   SpO2 100%   BMI 29.13 kg/m  GEN- NAD, alert and oriented x3 HEENT- PERRL, EOMI, non injected sclera, pink conjunctiva, MMM, oropharynx clear, TM clear bilat no effusion  Neck- Supple, no thyromegaly, no bruit CVS- RRR, no murmur RESP-CTAB ABD-NABS,soft,NT,ND EXT- No edema Pulses- Radial, DP- 2+        Assessment & Plan:      Problem List Items Addressed This Visit      Unprioritized   PAD (peripheral artery disease) ([place])    Reviewed last vascular note, hopefully as small ulcer continues to heal the mild neopathic pain will resolve Note he is not on statin drug, return for fasting labs      History of implantable cardioverter-defibrillator (ICD) placement   Atrial fibrillation [facility])    Per cardiology on coumadin       Other Visit Diagnoses    Routine general medical examination at a health care facility    -  Primary   CPE done, TDAP/Prevnar 13 done, Hep C screening due to age. Refer for colonoscpy, PSA to be done, refer to dermatology for precancerous lesions  Shingrix to be ordered    Relevant Orders   Tdap vaccine greater than or equal to  7yo IM (Completed)   Pneumococcal conjugate vaccine 13-valent IM (Completed)   AK (actinic keratosis)       Colon cancer screening       Prostate cancer screening       Need for tetanus, diphtheria, and acellular pertussis (Tdap) vaccine in patient of adolescent age or older       Relevant Orders   Tdap vaccine greater than or equal to 7yo IM (Completed)   Need for prophylactic vaccination against Streptococcus pneumoniae (pneumococcus)       Relevant Orders   Pneumococcal conjugate vaccine 13-valent IM (Completed)      Note: This dictation was prepared with Dragon dictation along with smaller phrase technology. Any  transcriptional errors that result from this process are unintentional.

## 2017-08-14 NOTE — Patient Instructions (Addendum)
Return for fasting labs next- Tuesday  Shingrix to be ordered TDAP and Prevnar 13 given  Referral to dermatology  Colonoscopy Referral  F/U 6 months

## 2017-08-15 ENCOUNTER — Encounter: Payer: Self-pay | Admitting: Family Medicine

## 2017-08-15 ENCOUNTER — Encounter (INDEPENDENT_AMBULATORY_CARE_PROVIDER_SITE_OTHER): Payer: Self-pay | Admitting: *Deleted

## 2017-08-20 ENCOUNTER — Other Ambulatory Visit: Payer: Medicare Other

## 2017-08-20 DIAGNOSIS — Z1159 Encounter for screening for other viral diseases: Secondary | ICD-10-CM | POA: Diagnosis not present

## 2017-08-20 DIAGNOSIS — I739 Peripheral vascular disease, unspecified: Secondary | ICD-10-CM

## 2017-08-20 DIAGNOSIS — Z0000 Encounter for general adult medical examination without abnormal findings: Secondary | ICD-10-CM | POA: Diagnosis not present

## 2017-08-20 DIAGNOSIS — Z125 Encounter for screening for malignant neoplasm of prostate: Secondary | ICD-10-CM

## 2017-08-20 DIAGNOSIS — Z13818 Encounter for screening for other digestive system disorders: Secondary | ICD-10-CM | POA: Diagnosis not present

## 2017-08-20 DIAGNOSIS — Z13220 Encounter for screening for lipoid disorders: Secondary | ICD-10-CM | POA: Diagnosis not present

## 2017-08-21 ENCOUNTER — Encounter: Payer: Self-pay | Admitting: Family Medicine

## 2017-08-21 LAB — LIPID PANEL
CHOLESTEROL: 179 mg/dL (ref <200)
HDL: 40 mg/dL — ABNORMAL LOW (ref >40)
LDL CHOLESTEROL (CALC): 115 mg/dL — AB
Non-HDL Cholesterol (Calc): 139 mg/dL (calc) — ABNORMAL HIGH (ref <130)
Total CHOL/HDL Ratio: 4.5 (calc) (ref <5.0)
Triglycerides: 126 mg/dL (ref <150)

## 2017-08-21 LAB — COMPREHENSIVE METABOLIC PANEL
AG RATIO: 1.5 (calc) (ref 1.0–2.5)
ALBUMIN MSPROF: 4.3 g/dL (ref 3.6–5.1)
ALKALINE PHOSPHATASE (APISO): 68 U/L (ref 40–115)
ALT: 22 U/L (ref 9–46)
AST: 21 U/L (ref 10–35)
BUN: 18 mg/dL (ref 7–25)
CO2: 24 mmol/L (ref 20–32)
Calcium: 9.4 mg/dL (ref 8.6–10.3)
Chloride: 104 mmol/L (ref 98–110)
Creat: 0.95 mg/dL (ref 0.70–1.25)
Globulin: 2.9 g/dL (calc) (ref 1.9–3.7)
Glucose, Bld: 104 mg/dL — ABNORMAL HIGH (ref 65–99)
POTASSIUM: 4.4 mmol/L (ref 3.5–5.3)
SODIUM: 139 mmol/L (ref 135–146)
TOTAL PROTEIN: 7.2 g/dL (ref 6.1–8.1)
Total Bilirubin: 0.5 mg/dL (ref 0.2–1.2)

## 2017-08-21 LAB — CBC WITH DIFFERENTIAL/PLATELET
BASOS PCT: 1.4 %
Basophils Absolute: 90 cells/uL (ref 0–200)
EOS ABS: 314 {cells}/uL (ref 15–500)
Eosinophils Relative: 4.9 %
HEMATOCRIT: 44.6 % (ref 38.5–50.0)
HEMOGLOBIN: 15.2 g/dL (ref 13.2–17.1)
LYMPHS ABS: 1165 {cells}/uL (ref 850–3900)
MCH: 32.1 pg (ref 27.0–33.0)
MCHC: 34.1 g/dL (ref 32.0–36.0)
MCV: 94.1 fL (ref 80.0–100.0)
MPV: 10.7 fL (ref 7.5–12.5)
Monocytes Relative: 9.4 %
NEUTROS ABS: 4230 {cells}/uL (ref 1500–7800)
Neutrophils Relative %: 66.1 %
Platelets: 195 10*3/uL (ref 140–400)
RBC: 4.74 10*6/uL (ref 4.20–5.80)
RDW: 12.2 % (ref 11.0–15.0)
Total Lymphocyte: 18.2 %
WBC: 6.4 10*3/uL (ref 3.8–10.8)
WBCMIX: 602 {cells}/uL (ref 200–950)

## 2017-08-21 LAB — HEPATITIS C ANTIBODY
HEP C AB: NONREACTIVE
SIGNAL TO CUT-OFF: 0.01 (ref <1.00)

## 2017-08-21 LAB — PSA: PSA: 0.6 ng/mL (ref <=4.0)

## 2017-08-23 DIAGNOSIS — I4891 Unspecified atrial fibrillation: Secondary | ICD-10-CM | POA: Diagnosis not present

## 2017-08-23 DIAGNOSIS — Z952 Presence of prosthetic heart valve: Secondary | ICD-10-CM | POA: Diagnosis not present

## 2017-08-26 ENCOUNTER — Other Ambulatory Visit: Payer: Self-pay | Admitting: *Deleted

## 2017-08-26 DIAGNOSIS — C44319 Basal cell carcinoma of skin of other parts of face: Secondary | ICD-10-CM | POA: Diagnosis not present

## 2017-08-26 DIAGNOSIS — L570 Actinic keratosis: Secondary | ICD-10-CM | POA: Diagnosis not present

## 2017-08-26 DIAGNOSIS — E785 Hyperlipidemia, unspecified: Secondary | ICD-10-CM

## 2017-08-26 DIAGNOSIS — D2371 Other benign neoplasm of skin of right lower limb, including hip: Secondary | ICD-10-CM | POA: Diagnosis not present

## 2017-08-26 DIAGNOSIS — X32XXXA Exposure to sunlight, initial encounter: Secondary | ICD-10-CM | POA: Diagnosis not present

## 2017-08-26 MED ORDER — ATORVASTATIN CALCIUM 20 MG PO TABS: 20.0000 mg | ORAL_TABLET | Freq: Every day | ORAL | 0 refills | Status: DC

## 2017-08-28 DIAGNOSIS — I480 Paroxysmal atrial fibrillation: Secondary | ICD-10-CM | POA: Diagnosis not present

## 2017-08-28 DIAGNOSIS — Z4502 Encounter for adjustment and management of automatic implantable cardiac defibrillator: Secondary | ICD-10-CM | POA: Diagnosis not present

## 2017-08-28 DIAGNOSIS — Q230 Congenital stenosis of aortic valve: Secondary | ICD-10-CM | POA: Diagnosis not present

## 2017-08-28 DIAGNOSIS — Z952 Presence of prosthetic heart valve: Secondary | ICD-10-CM | POA: Diagnosis not present

## 2017-08-28 DIAGNOSIS — I428 Other cardiomyopathies: Secondary | ICD-10-CM | POA: Diagnosis not present

## 2017-09-10 ENCOUNTER — Ambulatory Visit: Payer: Medicare Other

## 2017-09-13 DIAGNOSIS — Z952 Presence of prosthetic heart valve: Secondary | ICD-10-CM | POA: Diagnosis not present

## 2017-09-13 DIAGNOSIS — I4891 Unspecified atrial fibrillation: Secondary | ICD-10-CM | POA: Diagnosis not present

## 2017-09-13 DIAGNOSIS — Z7901 Long term (current) use of anticoagulants: Secondary | ICD-10-CM | POA: Diagnosis not present

## 2017-09-13 DIAGNOSIS — R791 Abnormal coagulation profile: Secondary | ICD-10-CM | POA: Diagnosis not present

## 2017-09-13 DIAGNOSIS — Z5181 Encounter for therapeutic drug level monitoring: Secondary | ICD-10-CM | POA: Diagnosis not present

## 2017-09-17 DIAGNOSIS — I4891 Unspecified atrial fibrillation: Secondary | ICD-10-CM | POA: Diagnosis not present

## 2017-09-17 DIAGNOSIS — Z7901 Long term (current) use of anticoagulants: Secondary | ICD-10-CM | POA: Diagnosis not present

## 2017-09-17 DIAGNOSIS — Z952 Presence of prosthetic heart valve: Secondary | ICD-10-CM | POA: Diagnosis not present

## 2017-09-17 DIAGNOSIS — Z5181 Encounter for therapeutic drug level monitoring: Secondary | ICD-10-CM | POA: Diagnosis not present

## 2017-09-27 DIAGNOSIS — Z5181 Encounter for therapeutic drug level monitoring: Secondary | ICD-10-CM | POA: Diagnosis not present

## 2017-09-27 DIAGNOSIS — Z952 Presence of prosthetic heart valve: Secondary | ICD-10-CM | POA: Diagnosis not present

## 2017-09-27 DIAGNOSIS — Z7901 Long term (current) use of anticoagulants: Secondary | ICD-10-CM | POA: Diagnosis not present

## 2017-09-27 DIAGNOSIS — I4891 Unspecified atrial fibrillation: Secondary | ICD-10-CM | POA: Diagnosis not present

## 2017-10-08 DIAGNOSIS — I739 Peripheral vascular disease, unspecified: Secondary | ICD-10-CM | POA: Diagnosis not present

## 2017-10-15 DIAGNOSIS — I4891 Unspecified atrial fibrillation: Secondary | ICD-10-CM | POA: Diagnosis not present

## 2017-10-15 DIAGNOSIS — Z7901 Long term (current) use of anticoagulants: Secondary | ICD-10-CM | POA: Diagnosis not present

## 2017-10-15 DIAGNOSIS — Z952 Presence of prosthetic heart valve: Secondary | ICD-10-CM | POA: Diagnosis not present

## 2017-10-15 DIAGNOSIS — Z5181 Encounter for therapeutic drug level monitoring: Secondary | ICD-10-CM | POA: Diagnosis not present

## 2017-10-23 ENCOUNTER — Encounter: Payer: Self-pay | Admitting: Vascular Surgery

## 2017-10-23 ENCOUNTER — Ambulatory Visit (INDEPENDENT_AMBULATORY_CARE_PROVIDER_SITE_OTHER)
Admission: RE | Admit: 2017-10-23 | Discharge: 2017-10-23 | Disposition: A | Discharge status: Home / Self Care | Payer: Medicare Other | Source: Ambulatory Visit | Attending: Vascular Surgery | Admitting: Vascular Surgery

## 2017-10-23 ENCOUNTER — Ambulatory Visit: Payer: Medicare Other | Admitting: Vascular Surgery

## 2017-10-23 ENCOUNTER — Ambulatory Visit (HOSPITAL_COMMUNITY)
Admission: RE | Admit: 2017-10-23 | Discharge: 2017-10-23 | Disposition: A | Discharge status: Home / Self Care | Payer: Medicare Other | Source: Ambulatory Visit | Attending: Vascular Surgery | Admitting: Vascular Surgery

## 2017-10-23 ENCOUNTER — Other Ambulatory Visit: Payer: Self-pay

## 2017-10-23 VITALS — BP 166/78 | HR 79 | Temp 97.1°F | Resp 16 | Ht 70.0 in | Wt 176.0 lb

## 2017-10-23 DIAGNOSIS — Z48812 Encounter for surgical aftercare following surgery on the circulatory system: Secondary | ICD-10-CM

## 2017-10-23 DIAGNOSIS — I10 Essential (primary) hypertension: Secondary | ICD-10-CM | POA: Insufficient documentation

## 2017-10-23 DIAGNOSIS — I739 Peripheral vascular disease, unspecified: Secondary | ICD-10-CM | POA: Diagnosis not present

## 2017-10-23 DIAGNOSIS — E785 Hyperlipidemia, unspecified: Secondary | ICD-10-CM | POA: Insufficient documentation

## 2017-10-23 NOTE — Progress Notes (Signed)
Patient name: Edward Marquez MRN: [id number] DOB: July 08, 1951 Sex: male  REASON FOR VISIT:   Follow-up of peripheral vascular disease  HPI:   Edward Marquez is a pleasant 67 y.o. male who I last saw on 07/24/2017.  He had originally presented with cellulitis of the right foot.  He underwent successful angioplasty of the right anterior tibial artery and posterior tibial artery in addition to the tibial peroneal trunk.  Previously he had a left above-knee popliteal to anterior tibial artery bypass in September 2018.  When I saw him last he was doing well and I set him up for 80-month follow-up visit.  Since I saw him last, his only complaint has been some pain where he had the wound on his right second toe.  Over the last few days this is gotten better.  The toes rub together in this area.  I do not get any history of claudication, rest pain, or any new ulcerations.  He is not a smoker.  He is on a statin.  He is not on aspirin because he is on Coumadin for atrial fibrillation.  Past Medical History:  Diagnosis Date  . Aortic valve defect   . Atrial fibrillation ([place])   . CHF (congestive heart failure) ([place])   . Clotting disorder ([place])   . Gout   . Hypertension   . Peripheral vascular disease ([place])     Family History  Problem Relation Age of Onset  . Heart disease Mother 11       bypass surgery  . Arthritis Mother   . Cancer Mother        breast/ colon  . Hyperlipidemia Mother   . Hypertension Mother     SOCIAL HISTORY: Social History   Tobacco Use  . Smoking status: Never Smoker  . Smokeless tobacco: Never Used  Substance Use Topics  . Alcohol use: Yes    Alcohol/week: 3.0 oz    Types: 5 Glasses of wine per week    Allergies  Allergen Reactions  . Ivp Dye [Iodinated Diagnostic Agents] Hives    Current Outpatient Medications  Medication Sig Dispense Refill  . atorvastatin (LIPITOR) 20 MG tablet Take 1 tablet (20 mg total) by mouth daily. 90 tablet 0  . diltiazem  (CARDIZEM CD) 120 MG 24 hr capsule Take 120 mg 2 (two) times daily by mouth.     . dofetilide (TIKOSYN) 500 MCG capsule Take 500 mcg by mouth 2 (two) times daily.     Marland Kitchen ibuprofen (ADVIL,MOTRIN) 200 MG tablet Take 400 mg every 6 (six) hours as needed by mouth for headache or moderate pain.    . metoprolol succinate (TOPROL-XL) 100 MG 24 hr tablet Take 100 mg by mouth daily. Take with or immediately following a meal.    . mupirocin ointment (BACTROBAN) 2 % Apply 1 application topically 2 (two) times daily. 22 g 0  . ramipril (ALTACE) 2.5 MG capsule Take 2.5 mg by mouth daily.    Marland Kitchen warfarin (COUMADIN) 7.5 MG tablet Take 3.75-7.5 mg See admin instructions by mouth. Take 7.5 mg by mouth daily in the evening except Tue and Sat take 3.75 mg daily in the evening     No current facility-administered medications for this visit.     REVIEW OF SYSTEMS:  [X]  denotes positive finding, [ ]  denotes negative finding Cardiac  Comments:  Chest pain or chest pressure:    Shortness of breath upon exertion:    Short of breath when lying flat:  Irregular heart rhythm:        Vascular    Pain in calf, thigh, or hip brought on by ambulation:    Pain in feet at night that wakes you up from your sleep:     Blood clot in your veins:    Leg swelling:         Pulmonary    Oxygen at home:    Productive cough:     Wheezing:         Neurologic    Sudden weakness in arms or legs:     Sudden numbness in arms or legs:     Sudden onset of difficulty speaking or slurred speech:    Temporary loss of vision in one eye:     Problems with dizziness:         Gastrointestinal    Blood in stool:     Vomited blood:         Genitourinary    Burning when urinating:     Blood in urine:        Psychiatric    Major depression:         Hematologic    Bleeding problems:    Problems with blood clotting too easily:        Skin    Rashes or ulcers:        Constitutional    Fever or chills:     PHYSICAL EXAM:    Vitals:   10/23/17 1254 10/23/17 1309  BP: (!) 156/87 (!) 166/78  Pulse: 79 79  Resp: 16   Temp: (!) 97.1 F (36.2 C)   TempSrc: Oral   SpO2: 100%   Weight: 176 lb (79.8 kg)   Height: 5\' 10"  (1.778 m)     GENERAL: The patient is a well-nourished male, in no acute distress. The vital signs are documented above. CARDIAC: There is a regular rate and rhythm.  VASCULAR: I do not detect carotid bruits. On the right side he has a palpable femoral pulse.  I cannot palpate pedal pulses. On the left side he has a palpable femoral and popliteal pulse.  I cannot palpate pedal pulses. PULMONARY: There is good air exchange bilaterally without wheezing or rales. ABDOMEN: Soft and non-tender with normal pitched bowel sounds.  MUSCULOSKELETAL: There are no major deformities or cyanosis. NEUROLOGIC: No focal weakness or paresthesias are detected. SKIN: The small wound between the second and third toes has essentially healed. PSYCHIATRIC: The patient has a normal affect.  DATA:    LOWER EXTREMITY ARTERIAL DOPPLER STUDY: I have independently interpreted his lower extremity arterial Doppler study today.  On the right side there is a biphasic posterior tibial signal with an ABI of 100%.  Toe pressure on the right is 36 mmHg.  There is a monophasic dorsalis pedis signal.  On the left side, ABI could not be obtained as the vessels are noncompressible.  Toe pressure is 36.  LOWER EXTREMITY ARTERIAL DUPLEX: I have independently interpreted his lower extremity arterial duplex scan today.  On the right side there are triphasic signals from the common femoral artery down through the proximal popliteal artery.  There are monophasic signals in the tibial vessels.  On the left side there are triphasic Doppler signals throughout the superficial femoral artery with monophasic tibial signals.   MEDICAL ISSUES:   PERIPHERAL VASCULAR DISEASE: The patient is doing well status post a left above-knee pop to  anterior tibial artery bypass and angioplasty of the right lower  extremity.  I have encouraged him to stay as active as possible.  Fortunately he is not a smoker.  We have discussed the way to potentially keep the second and third toes in the right foot from rubbing together.  I have ordered follow-up ABIs in 6 months and also a graft duplex in the left leg and arterial duplex of the right to follow his tibial angioplasty.  He knows to call sooner if he has problems.  [name] [facility] [phone]

## 2017-10-23 NOTE — Progress Notes (Signed)
Vitals:   10/23/17 1254  BP: (!) 156/87  Pulse: 79  Resp: 16  Temp: (!) 97.1 F (36.2 C)  TempSrc: Oral  SpO2: 100%  Weight: 176 lb (79.8 kg)  Height: 5\' 10"  (1.778 m)

## 2017-10-24 ENCOUNTER — Other Ambulatory Visit: Payer: Self-pay

## 2017-10-24 DIAGNOSIS — Z95810 Presence of automatic (implantable) cardiac defibrillator: Secondary | ICD-10-CM | POA: Diagnosis not present

## 2017-10-24 DIAGNOSIS — I739 Peripheral vascular disease, unspecified: Secondary | ICD-10-CM

## 2017-11-25 ENCOUNTER — Other Ambulatory Visit: Payer: Self-pay

## 2017-12-03 DIAGNOSIS — Z952 Presence of prosthetic heart valve: Secondary | ICD-10-CM | POA: Diagnosis not present

## 2017-12-03 DIAGNOSIS — Z5181 Encounter for therapeutic drug level monitoring: Secondary | ICD-10-CM | POA: Diagnosis not present

## 2017-12-03 DIAGNOSIS — I4891 Unspecified atrial fibrillation: Secondary | ICD-10-CM | POA: Diagnosis not present

## 2017-12-03 DIAGNOSIS — Z7901 Long term (current) use of anticoagulants: Secondary | ICD-10-CM | POA: Diagnosis not present

## 2017-12-31 DIAGNOSIS — Z5181 Encounter for therapeutic drug level monitoring: Secondary | ICD-10-CM | POA: Diagnosis not present

## 2017-12-31 DIAGNOSIS — Z952 Presence of prosthetic heart valve: Secondary | ICD-10-CM | POA: Diagnosis not present

## 2017-12-31 DIAGNOSIS — I4891 Unspecified atrial fibrillation: Secondary | ICD-10-CM | POA: Diagnosis not present

## 2017-12-31 DIAGNOSIS — Z7901 Long term (current) use of anticoagulants: Secondary | ICD-10-CM | POA: Diagnosis not present

## 2018-01-24 DIAGNOSIS — Z95810 Presence of automatic (implantable) cardiac defibrillator: Secondary | ICD-10-CM | POA: Diagnosis not present

## 2018-01-28 DIAGNOSIS — I4891 Unspecified atrial fibrillation: Secondary | ICD-10-CM | POA: Diagnosis not present

## 2018-01-28 DIAGNOSIS — Z952 Presence of prosthetic heart valve: Secondary | ICD-10-CM | POA: Diagnosis not present

## 2018-01-28 DIAGNOSIS — Z7901 Long term (current) use of anticoagulants: Secondary | ICD-10-CM | POA: Diagnosis not present

## 2018-02-17 ENCOUNTER — Ambulatory Visit: Payer: Self-pay | Admitting: Family Medicine

## 2018-04-04 DIAGNOSIS — Z7901 Long term (current) use of anticoagulants: Secondary | ICD-10-CM | POA: Diagnosis not present

## 2018-04-04 DIAGNOSIS — Z5181 Encounter for therapeutic drug level monitoring: Secondary | ICD-10-CM | POA: Diagnosis not present

## 2018-04-04 DIAGNOSIS — R791 Abnormal coagulation profile: Secondary | ICD-10-CM | POA: Diagnosis not present

## 2018-04-04 DIAGNOSIS — Z952 Presence of prosthetic heart valve: Secondary | ICD-10-CM | POA: Diagnosis not present

## 2018-04-04 DIAGNOSIS — I4891 Unspecified atrial fibrillation: Secondary | ICD-10-CM | POA: Diagnosis not present

## 2018-04-30 ENCOUNTER — Encounter (HOSPITAL_COMMUNITY): Payer: Medicare Other

## 2018-04-30 ENCOUNTER — Ambulatory Visit: Payer: Medicare Other | Admitting: Vascular Surgery

## 2018-04-30 ENCOUNTER — Other Ambulatory Visit (HOSPITAL_COMMUNITY): Payer: Medicare Other

## 2018-05-05 DIAGNOSIS — Z95810 Presence of automatic (implantable) cardiac defibrillator: Secondary | ICD-10-CM | POA: Diagnosis not present

## 2018-05-14 DIAGNOSIS — Z952 Presence of prosthetic heart valve: Secondary | ICD-10-CM | POA: Diagnosis not present

## 2018-05-14 DIAGNOSIS — R791 Abnormal coagulation profile: Secondary | ICD-10-CM | POA: Diagnosis not present

## 2018-05-14 DIAGNOSIS — I482 Chronic atrial fibrillation, unspecified: Secondary | ICD-10-CM | POA: Diagnosis not present

## 2018-05-21 DIAGNOSIS — Z95810 Presence of automatic (implantable) cardiac defibrillator: Secondary | ICD-10-CM | POA: Diagnosis not present

## 2018-05-21 DIAGNOSIS — I4821 Permanent atrial fibrillation: Secondary | ICD-10-CM | POA: Diagnosis not present

## 2018-05-21 DIAGNOSIS — I739 Peripheral vascular disease, unspecified: Secondary | ICD-10-CM | POA: Diagnosis not present

## 2018-05-21 DIAGNOSIS — R791 Abnormal coagulation profile: Secondary | ICD-10-CM | POA: Diagnosis not present

## 2018-05-21 DIAGNOSIS — I420 Dilated cardiomyopathy: Secondary | ICD-10-CM | POA: Diagnosis not present

## 2018-05-21 DIAGNOSIS — Z952 Presence of prosthetic heart valve: Secondary | ICD-10-CM | POA: Diagnosis not present

## 2018-05-29 DIAGNOSIS — Z952 Presence of prosthetic heart valve: Secondary | ICD-10-CM | POA: Diagnosis not present

## 2018-05-29 DIAGNOSIS — I4821 Permanent atrial fibrillation: Secondary | ICD-10-CM | POA: Diagnosis not present

## 2018-05-29 DIAGNOSIS — Z7901 Long term (current) use of anticoagulants: Secondary | ICD-10-CM | POA: Diagnosis not present

## 2018-06-04 ENCOUNTER — Encounter (HOSPITAL_COMMUNITY): Payer: Medicare Other

## 2018-06-04 ENCOUNTER — Other Ambulatory Visit (HOSPITAL_COMMUNITY): Payer: Medicare Other

## 2018-06-05 DIAGNOSIS — I440 Atrioventricular block, first degree: Secondary | ICD-10-CM | POA: Diagnosis not present

## 2018-06-05 DIAGNOSIS — R001 Bradycardia, unspecified: Secondary | ICD-10-CM | POA: Diagnosis not present

## 2018-06-05 DIAGNOSIS — Z7901 Long term (current) use of anticoagulants: Secondary | ICD-10-CM | POA: Diagnosis not present

## 2018-06-05 DIAGNOSIS — I499 Cardiac arrhythmia, unspecified: Secondary | ICD-10-CM | POA: Diagnosis not present

## 2018-06-05 DIAGNOSIS — I4891 Unspecified atrial fibrillation: Secondary | ICD-10-CM | POA: Diagnosis not present

## 2018-06-05 DIAGNOSIS — I4821 Permanent atrial fibrillation: Secondary | ICD-10-CM | POA: Diagnosis not present

## 2018-06-11 ENCOUNTER — Other Ambulatory Visit: Payer: Self-pay

## 2018-06-11 ENCOUNTER — Ambulatory Visit (INDEPENDENT_AMBULATORY_CARE_PROVIDER_SITE_OTHER)
Admission: RE | Admit: 2018-06-11 | Discharge: 2018-06-11 | Disposition: A | Discharge status: Home / Self Care | Payer: Medicare Other | Source: Ambulatory Visit | Attending: Vascular Surgery | Admitting: Vascular Surgery

## 2018-06-11 ENCOUNTER — Ambulatory Visit (HOSPITAL_COMMUNITY)
Admission: RE | Admit: 2018-06-11 | Discharge: 2018-06-11 | Disposition: A | Discharge status: Home / Self Care | Payer: Medicare Other | Source: Ambulatory Visit | Attending: Vascular Surgery | Admitting: Vascular Surgery

## 2018-06-11 ENCOUNTER — Encounter: Payer: Self-pay | Admitting: Vascular Surgery

## 2018-06-11 ENCOUNTER — Inpatient Hospital Stay (HOSPITAL_COMMUNITY): Admission: RE | Admit: 2018-06-11 | Payer: Medicare Other | Source: Ambulatory Visit

## 2018-06-11 ENCOUNTER — Ambulatory Visit: Payer: Medicare Other | Admitting: Vascular Surgery

## 2018-06-11 VITALS — BP 127/78 | HR 88 | Resp 18 | Ht 70.0 in | Wt 180.0 lb

## 2018-06-11 DIAGNOSIS — I872 Venous insufficiency (chronic) (peripheral): Secondary | ICD-10-CM

## 2018-06-11 DIAGNOSIS — I739 Peripheral vascular disease, unspecified: Secondary | ICD-10-CM | POA: Diagnosis not present

## 2018-06-11 NOTE — Progress Notes (Signed)
Patient name: Edward Marquez MRN: [id number] DOB: January 28, 1951 Sex: male  REASON FOR VISIT:   87-month follow-up visit.  HPI:   Edward Marquez is a pleasant 67 y.o. male who I last saw on 10/23/2017.  The patient had originally presented with cellulitis of the right foot.  He underwent successful angioplasty of the right anterior tibial artery and posterior tibial artery in addition to the tibial peroneal trunk.  Patient had a left above-knee popliteal to anterior tibial bypass in September 2018.  At the time of the last visit ABI on the right was 100% with a biphasic posterior tibial signal with the Doppler.  Toe pressure was 36 mmHg.  Comes in for a six-month follow-up visit.  Since I saw him last he does not describe any significant claudication in either lower extremity.  He does get some tingling in his great toes bilaterally.  The wounds on his toes have healed.  He is not a smoker.  He denies any rest pain or new ulcers.  He remains very active and recently got back from hiking in [place].  Past Medical History:  Diagnosis Date  . Aortic valve defect   . Atrial fibrillation ([place])   . CHF (congestive heart failure) ([place])   . Clotting disorder ([place])   . Gout   . Hypertension   . Peripheral vascular disease ([place])     Family History  Problem Relation Age of Onset  . Heart disease Mother 17       bypass surgery  . Arthritis Mother   . Cancer Mother        breast/ colon  . Hyperlipidemia Mother   . Hypertension Mother     SOCIAL HISTORY: Social History   Tobacco Use  . Smoking status: Never Smoker  . Smokeless tobacco: Never Used  Substance Use Topics  . Alcohol use: Yes    Alcohol/week: 5.0 standard drinks    Types: 5 Glasses of wine per week    Allergies  Allergen Reactions  . Ivp Dye [Iodinated Diagnostic Agents] Hives    Current Outpatient Medications  Medication Sig Dispense Refill  . atorvastatin (LIPITOR) 20 MG tablet Take 1 tablet (20 mg total) by mouth daily.  90 tablet 0  . diltiazem (CARDIZEM CD) 120 MG 24 hr capsule Take 120 mg 2 (two) times daily by mouth.     . dofetilide (TIKOSYN) 500 MCG capsule Take 500 mcg by mouth 2 (two) times daily.     Marland Kitchen ibuprofen (ADVIL,MOTRIN) 200 MG tablet Take 400 mg every 6 (six) hours as needed by mouth for headache or moderate pain.    . metoprolol succinate (TOPROL-XL) 100 MG 24 hr tablet Take 100 mg by mouth daily. Take with or immediately following a meal.    . mupirocin ointment (BACTROBAN) 2 % Apply 1 application topically 2 (two) times daily. 22 g 0  . ramipril (ALTACE) 2.5 MG capsule Take 2.5 mg by mouth daily.    Marland Kitchen warfarin (COUMADIN) 7.5 MG tablet Take 3.75-7.5 mg See admin instructions by mouth. Take 7.5 mg by mouth daily in the evening except Tue and Sat take 3.75 mg daily in the evening     No current facility-administered medications for this visit.     REVIEW OF SYSTEMS:  [X]  denotes positive finding, [ ]  denotes negative finding Cardiac  Comments:  Chest pain or chest pressure:    Shortness of breath upon exertion:    Short of breath when lying flat:    Irregular  heart rhythm: x       Vascular    Pain in calf, thigh, or hip brought on by ambulation:    Pain in feet at night that wakes you up from your sleep:     Blood clot in your veins:    Leg swelling:         Pulmonary    Oxygen at home:    Productive cough:     Wheezing:         Neurologic    Sudden weakness in arms or legs:     Sudden numbness in arms or legs:     Sudden onset of difficulty speaking or slurred speech:    Temporary loss of vision in one eye:     Problems with dizziness:         Gastrointestinal    Blood in stool:     Vomited blood:         Genitourinary    Burning when urinating:     Blood in urine:        Psychiatric    Major depression:         Hematologic    Bleeding problems:    Problems with blood clotting too easily:        Skin    Rashes or ulcers:        Constitutional    Fever or  chills:     PHYSICAL EXAM:   Vitals:   06/11/18 1101  BP: 127/78  Pulse: 88  Resp: 18  SpO2: 99%  Weight: 180 lb (81.6 kg)  Height: 5\' 10"  (1.778 m)    GENERAL: The patient is a well-nourished male, in no acute distress. The vital signs are documented above. CARDIAC: There is a regular rate and rhythm.  VASCULAR: I do not detect carotid bruits. He has palpable femoral pulses bilaterally. On the left side he has a palpable popliteal pulse which is likely his graft pulse.  I cannot palpate pedal pulses. I cannot palpate pedal pulses on the right. Both feet are warm and well-perfused. He has no significant lower extremity swelling. PULMONARY: There is good air exchange bilaterally without wheezing or rales. ABDOMEN: Soft and non-tender with normal pitched bowel sounds.  MUSCULOSKELETAL: There are no major deformities or cyanosis. NEUROLOGIC: No focal weakness or paresthesias are detected. SKIN: There are no ulcers or rashes noted. PSYCHIATRIC: The patient has a normal affect.  DATA:    DUPLEX: I have independently interpreted the duplex of his right infrainguinal bypass.  The left above-knee popliteal to anterior tibial artery bypass graft is patent with triphasic flow throughout.  Of note the posterior tibial artery is noted to be occluded.  ARTERIAL DOPPLER STUDY: I have independently interpreted his arterial Doppler study.  On the right side there is a monophasic anterior tibial signal with a biphasic posterior tibial signal.  ABI is 100%.  This may not be reliable as the vessels are calcified.  Toe pressure 17 mmHg.  On the left side there is a biphasic anterior tibial signal and a triphasic posterior tibial signal.  The ABI cannot be obtained as the artery is not compressible.  Toe pressure is 46 mmHg.  MEDICAL ISSUES:   PERIPHERAL VASCULAR DISEASE: I performed angioplasty of the right anterior tibial artery, posterior tibial artery, and tibial peroneal trunk on 06/24/2017  for nonhealing wounds of the right foot.  He has an ABI of 100% on the right now with a biphasic posterior tibial signal.  However his toe pressure 17 mmHg.  On the left side, he had a left above-knee popliteal to anterior tibial artery bypass with a vein graft on 05/03/2017.  This graft is patent with a biphasic anterior tibial signal with the Doppler and a toe pressure 46 mmHg.  Overall I think he is doing very well and encouraged him to stay as active as possible.  He is not a smoker.  I have ordered follow-up Doppler studies in 1 year and I will see him back at that time.  He will also have a duplex of his graft at that time.  He knows to call sooner if he has problems    [name] [facility] [employer] [facility] [phone]

## 2018-07-01 DIAGNOSIS — Z952 Presence of prosthetic heart valve: Secondary | ICD-10-CM | POA: Diagnosis not present

## 2018-07-01 DIAGNOSIS — Z7901 Long term (current) use of anticoagulants: Secondary | ICD-10-CM | POA: Diagnosis not present

## 2018-07-01 DIAGNOSIS — I4821 Permanent atrial fibrillation: Secondary | ICD-10-CM | POA: Diagnosis not present

## 2018-07-01 DIAGNOSIS — Z5181 Encounter for therapeutic drug level monitoring: Secondary | ICD-10-CM | POA: Diagnosis not present

## 2018-07-01 DIAGNOSIS — R791 Abnormal coagulation profile: Secondary | ICD-10-CM | POA: Diagnosis not present

## 2018-07-09 DIAGNOSIS — Z952 Presence of prosthetic heart valve: Secondary | ICD-10-CM | POA: Diagnosis not present

## 2018-07-09 DIAGNOSIS — Z7901 Long term (current) use of anticoagulants: Secondary | ICD-10-CM | POA: Diagnosis not present

## 2018-07-09 DIAGNOSIS — I4821 Permanent atrial fibrillation: Secondary | ICD-10-CM | POA: Diagnosis not present

## 2018-07-10 DIAGNOSIS — H18831 Recurrent erosion of cornea, right eye: Secondary | ICD-10-CM | POA: Diagnosis not present

## 2018-07-12 DIAGNOSIS — H18831 Recurrent erosion of cornea, right eye: Secondary | ICD-10-CM | POA: Diagnosis not present

## 2018-07-17 DIAGNOSIS — I4821 Permanent atrial fibrillation: Secondary | ICD-10-CM | POA: Diagnosis not present

## 2018-07-17 DIAGNOSIS — Z952 Presence of prosthetic heart valve: Secondary | ICD-10-CM | POA: Diagnosis not present

## 2018-07-17 DIAGNOSIS — Z7901 Long term (current) use of anticoagulants: Secondary | ICD-10-CM | POA: Diagnosis not present

## 2018-07-23 DIAGNOSIS — I4821 Permanent atrial fibrillation: Secondary | ICD-10-CM | POA: Diagnosis not present

## 2018-07-23 DIAGNOSIS — R791 Abnormal coagulation profile: Secondary | ICD-10-CM | POA: Diagnosis not present

## 2018-07-23 DIAGNOSIS — Z7901 Long term (current) use of anticoagulants: Secondary | ICD-10-CM | POA: Diagnosis not present

## 2018-07-23 DIAGNOSIS — Z952 Presence of prosthetic heart valve: Secondary | ICD-10-CM | POA: Diagnosis not present

## 2018-07-30 DIAGNOSIS — I4821 Permanent atrial fibrillation: Secondary | ICD-10-CM | POA: Diagnosis not present

## 2018-07-30 DIAGNOSIS — Z7901 Long term (current) use of anticoagulants: Secondary | ICD-10-CM | POA: Diagnosis not present

## 2018-07-30 DIAGNOSIS — R791 Abnormal coagulation profile: Secondary | ICD-10-CM | POA: Diagnosis not present

## 2018-07-30 DIAGNOSIS — Z5181 Encounter for therapeutic drug level monitoring: Secondary | ICD-10-CM | POA: Diagnosis not present

## 2018-07-30 DIAGNOSIS — Z952 Presence of prosthetic heart valve: Secondary | ICD-10-CM | POA: Diagnosis not present

## 2018-08-07 DIAGNOSIS — Z95810 Presence of automatic (implantable) cardiac defibrillator: Secondary | ICD-10-CM | POA: Diagnosis not present

## 2018-08-07 DIAGNOSIS — R791 Abnormal coagulation profile: Secondary | ICD-10-CM | POA: Diagnosis not present

## 2018-08-07 DIAGNOSIS — I509 Heart failure, unspecified: Secondary | ICD-10-CM | POA: Diagnosis not present

## 2018-08-07 DIAGNOSIS — I4821 Permanent atrial fibrillation: Secondary | ICD-10-CM | POA: Diagnosis not present

## 2018-08-07 DIAGNOSIS — Z952 Presence of prosthetic heart valve: Secondary | ICD-10-CM | POA: Diagnosis not present

## 2018-08-07 DIAGNOSIS — Z7901 Long term (current) use of anticoagulants: Secondary | ICD-10-CM | POA: Diagnosis not present

## 2018-08-15 DIAGNOSIS — Z952 Presence of prosthetic heart valve: Secondary | ICD-10-CM | POA: Diagnosis not present

## 2018-08-15 DIAGNOSIS — Z7901 Long term (current) use of anticoagulants: Secondary | ICD-10-CM | POA: Diagnosis not present

## 2018-08-15 DIAGNOSIS — I4821 Permanent atrial fibrillation: Secondary | ICD-10-CM | POA: Diagnosis not present

## 2018-08-22 DIAGNOSIS — I4821 Permanent atrial fibrillation: Secondary | ICD-10-CM | POA: Diagnosis not present

## 2018-08-22 DIAGNOSIS — Z952 Presence of prosthetic heart valve: Secondary | ICD-10-CM | POA: Diagnosis not present

## 2018-08-22 DIAGNOSIS — I739 Peripheral vascular disease, unspecified: Secondary | ICD-10-CM | POA: Diagnosis not present

## 2018-08-22 DIAGNOSIS — Z7901 Long term (current) use of anticoagulants: Secondary | ICD-10-CM | POA: Diagnosis not present

## 2018-08-22 DIAGNOSIS — R791 Abnormal coagulation profile: Secondary | ICD-10-CM | POA: Diagnosis not present

## 2018-08-22 DIAGNOSIS — I420 Dilated cardiomyopathy: Secondary | ICD-10-CM | POA: Diagnosis not present

## 2018-08-29 DIAGNOSIS — R791 Abnormal coagulation profile: Secondary | ICD-10-CM | POA: Diagnosis not present

## 2018-08-29 DIAGNOSIS — Z7901 Long term (current) use of anticoagulants: Secondary | ICD-10-CM | POA: Diagnosis not present

## 2018-08-29 DIAGNOSIS — I4821 Permanent atrial fibrillation: Secondary | ICD-10-CM | POA: Diagnosis not present

## 2018-08-29 DIAGNOSIS — Z952 Presence of prosthetic heart valve: Secondary | ICD-10-CM | POA: Diagnosis not present

## 2018-09-05 DIAGNOSIS — Z7901 Long term (current) use of anticoagulants: Secondary | ICD-10-CM | POA: Diagnosis not present

## 2018-09-05 DIAGNOSIS — I4821 Permanent atrial fibrillation: Secondary | ICD-10-CM | POA: Diagnosis not present

## 2018-09-05 DIAGNOSIS — Z952 Presence of prosthetic heart valve: Secondary | ICD-10-CM | POA: Diagnosis not present

## 2018-09-09 DIAGNOSIS — I350 Nonrheumatic aortic (valve) stenosis: Secondary | ICD-10-CM | POA: Diagnosis not present

## 2018-09-09 DIAGNOSIS — I447 Left bundle-branch block, unspecified: Secondary | ICD-10-CM | POA: Diagnosis not present

## 2018-09-09 DIAGNOSIS — Z952 Presence of prosthetic heart valve: Secondary | ICD-10-CM | POA: Diagnosis not present

## 2018-09-09 DIAGNOSIS — I4891 Unspecified atrial fibrillation: Secondary | ICD-10-CM | POA: Diagnosis not present

## 2018-09-09 DIAGNOSIS — I739 Peripheral vascular disease, unspecified: Secondary | ICD-10-CM | POA: Diagnosis not present

## 2018-09-09 DIAGNOSIS — I4821 Permanent atrial fibrillation: Secondary | ICD-10-CM | POA: Diagnosis not present

## 2018-09-09 DIAGNOSIS — I428 Other cardiomyopathies: Secondary | ICD-10-CM | POA: Diagnosis not present

## 2018-09-23 DIAGNOSIS — Z7901 Long term (current) use of anticoagulants: Secondary | ICD-10-CM | POA: Diagnosis not present

## 2018-09-23 DIAGNOSIS — Z952 Presence of prosthetic heart valve: Secondary | ICD-10-CM | POA: Diagnosis not present

## 2018-09-23 DIAGNOSIS — I4821 Permanent atrial fibrillation: Secondary | ICD-10-CM | POA: Diagnosis not present

## 2018-10-24 DIAGNOSIS — Z7901 Long term (current) use of anticoagulants: Secondary | ICD-10-CM | POA: Diagnosis not present

## 2018-10-24 DIAGNOSIS — R791 Abnormal coagulation profile: Secondary | ICD-10-CM | POA: Diagnosis not present

## 2018-10-24 DIAGNOSIS — Z5181 Encounter for therapeutic drug level monitoring: Secondary | ICD-10-CM | POA: Diagnosis not present

## 2018-10-24 DIAGNOSIS — Z952 Presence of prosthetic heart valve: Secondary | ICD-10-CM | POA: Diagnosis not present

## 2018-10-24 DIAGNOSIS — I4821 Permanent atrial fibrillation: Secondary | ICD-10-CM | POA: Diagnosis not present

## 2018-10-27 DIAGNOSIS — I739 Peripheral vascular disease, unspecified: Secondary | ICD-10-CM | POA: Diagnosis not present

## 2018-10-27 DIAGNOSIS — Z7901 Long term (current) use of anticoagulants: Secondary | ICD-10-CM | POA: Diagnosis not present

## 2018-10-27 DIAGNOSIS — I4891 Unspecified atrial fibrillation: Secondary | ICD-10-CM | POA: Diagnosis not present

## 2018-10-27 DIAGNOSIS — Z952 Presence of prosthetic heart valve: Secondary | ICD-10-CM | POA: Diagnosis not present

## 2018-10-27 DIAGNOSIS — Z95810 Presence of automatic (implantable) cardiac defibrillator: Secondary | ICD-10-CM | POA: Diagnosis not present

## 2018-10-27 DIAGNOSIS — I428 Other cardiomyopathies: Secondary | ICD-10-CM | POA: Diagnosis not present

## 2018-10-28 DIAGNOSIS — I499 Cardiac arrhythmia, unspecified: Secondary | ICD-10-CM | POA: Diagnosis not present

## 2018-10-28 DIAGNOSIS — I4891 Unspecified atrial fibrillation: Secondary | ICD-10-CM | POA: Diagnosis not present

## 2018-11-06 DIAGNOSIS — Z95810 Presence of automatic (implantable) cardiac defibrillator: Secondary | ICD-10-CM | POA: Diagnosis not present

## 2018-11-25 DIAGNOSIS — Z7901 Long term (current) use of anticoagulants: Secondary | ICD-10-CM | POA: Diagnosis not present

## 2018-11-25 DIAGNOSIS — Z5181 Encounter for therapeutic drug level monitoring: Secondary | ICD-10-CM | POA: Diagnosis not present

## 2018-11-25 DIAGNOSIS — R791 Abnormal coagulation profile: Secondary | ICD-10-CM | POA: Diagnosis not present

## 2018-11-25 DIAGNOSIS — I4821 Permanent atrial fibrillation: Secondary | ICD-10-CM | POA: Diagnosis not present

## 2018-11-25 DIAGNOSIS — Z952 Presence of prosthetic heart valve: Secondary | ICD-10-CM | POA: Diagnosis not present

## 2018-12-24 DIAGNOSIS — R791 Abnormal coagulation profile: Secondary | ICD-10-CM | POA: Diagnosis not present

## 2018-12-24 DIAGNOSIS — Z5181 Encounter for therapeutic drug level monitoring: Secondary | ICD-10-CM | POA: Diagnosis not present

## 2018-12-24 DIAGNOSIS — Z7901 Long term (current) use of anticoagulants: Secondary | ICD-10-CM | POA: Diagnosis not present

## 2018-12-24 DIAGNOSIS — Z952 Presence of prosthetic heart valve: Secondary | ICD-10-CM | POA: Diagnosis not present

## 2018-12-24 DIAGNOSIS — I4821 Permanent atrial fibrillation: Secondary | ICD-10-CM | POA: Diagnosis not present

## 2019-01-12 ENCOUNTER — Telehealth: Payer: Self-pay | Admitting: Family Medicine

## 2019-01-12 NOTE — Telephone Encounter (Signed)
Spoke with patient and informed him of Dr. Dorian Heckle recommendations. Patient verbalized understanding and requested visit here tomorrow. Per Dr. Buelah Manis patient was added to the schedule

## 2019-01-12 NOTE — Telephone Encounter (Signed)
Patient called in stating that he cut the tip of  his fourth digit to his right hand  about 1 week ago. He has been putting antibiotic ointment and a bandaid to the site however it is now bright red, swollen and hot to the touch with redness and warmth spreading into his hand. I advised patient that he should go to the UC for possible cellulitis but patient is admit about coming into the office and we do not have any appointments until Wednesday. Please advise?

## 2019-01-12 NOTE — Telephone Encounter (Signed)
Pt needs to be seen at [facility] or ER for this, infection can spread deeper into finger If he wants to wait can be seen tomorrow, but I recommend he go now

## 2019-01-13 ENCOUNTER — Ambulatory Visit (INDEPENDENT_AMBULATORY_CARE_PROVIDER_SITE_OTHER): Payer: Medicare Other | Admitting: Family Medicine

## 2019-01-13 ENCOUNTER — Encounter: Payer: Self-pay | Admitting: Family Medicine

## 2019-01-13 ENCOUNTER — Other Ambulatory Visit: Payer: Self-pay

## 2019-01-13 VITALS — BP 120/72 | HR 63 | Temp 98.3°F | Resp 18 | Ht 70.0 in | Wt 177.4 lb

## 2019-01-13 DIAGNOSIS — L03011 Cellulitis of right finger: Secondary | ICD-10-CM

## 2019-01-13 DIAGNOSIS — L02511 Cutaneous abscess of right hand: Secondary | ICD-10-CM

## 2019-01-13 DIAGNOSIS — H9192 Unspecified hearing loss, left ear: Secondary | ICD-10-CM | POA: Diagnosis not present

## 2019-01-13 MED ORDER — SULFAMETHOXAZOLE-TRIMETHOPRIM 800-160 MG PO TABS: 1.0000 | ORAL_TABLET | Freq: Two times a day (BID) | ORAL | 0 refills | Status: DC

## 2019-01-13 NOTE — Progress Notes (Signed)
   Subjective:    Patient ID: Edward Marquez, male    DOB: 22-Mar-1951, 68 y.o.   MRN: [id number]  Patient presents for Wound Infection (R finger)  Pt here with right middle finger infection. HE was working in the yard last week did pull splinters out but that was above where it is infected. Then he thought possible spider bite, no fever or chills Had pustule which burst open noticed redness going down to knuckle area over past couple days  Able to use hand, unable to bend finger all the way due to swelling  Didn't want to go to ER orUC concerned about COVID  No pain unless touched  End of visit stated feels like he has lost the base in his hearing, no ear pain uses qtips, this has been in past few weeks Has had allergy symptoms past few months   Review Of Systems:  GEN- denies fatigue, fever, weight loss,weakness, recent illness HEENT- denies eye drainage, change in vision, nasal discharge, CVS- denies chest pain, palpitations RESP- denies SOB, cough, wheeze MSK- denies joint pain, muscle aches, injury Neuro- denies headache, dizziness, syncope, seizure activity       Objective:    BP 120/72   Pulse 63   Temp 98.3 F (36.8 C)   Resp 18   Ht 5\' 10"  (1.778 m)   Wt 177 lb 6.4 oz (80.5 kg)   SpO2 99%   BMI 25.45 kg/m  GEN- NAD, alert and oriented x3 EXT- right hand, swelling of middle finger with erythema between MIP and PIP, pustule at center mild drainage, macerated skin MSK able to bend at PIP, MIP, DIP unable to make full fist, FROM wrist tendon in tact  HEENT- PERRL EOMI, NON ICTERIC, TM CLEAR bilat, hearing grossly in tact, nares clear, no sinus tenderness  Procedure- Incision and Drainage Procedure explained to patient questions answered benefits and risks discussed verbal consent obtained. Antiseptic-Betadine Anesthesia-lidocaine 1% Incision performed small amount  of pus expressed Culture taken Minimal blood loss Patient tolerated procedure well Bandage  applied      Assessment & Plan:      Problem List Items Addressed This Visit    None    Visit Diagnoses    Abscess of finger of right hand    -  Primary   s/p incision/drainage, start bactrim culture sent, tendons in tact, base d on apperance superficial infection recheck in 48 hours    Relevant Orders   WOUND CULTURE   Cellulitis of finger of right hand       Relevant Orders   WOUND CULTURE   Change in hearing of left ear       No infection noted, can try anti-histamine, he has some underlying allergies      Note: This dictation was prepared with Dragon dictation along with smaller phrase technology. Any transcriptional errors that result from this process are unintentional.

## 2019-01-13 NOTE — Patient Instructions (Addendum)
F/U Thursday for recheck  Take antibiotics Try claritin for allergies  Clean soap and water, change bandage in 24 hours

## 2019-01-15 ENCOUNTER — Encounter: Payer: Self-pay | Admitting: Family Medicine

## 2019-01-15 ENCOUNTER — Other Ambulatory Visit: Payer: Self-pay

## 2019-01-15 ENCOUNTER — Ambulatory Visit (INDEPENDENT_AMBULATORY_CARE_PROVIDER_SITE_OTHER): Payer: Medicare Other | Admitting: Family Medicine

## 2019-01-15 VITALS — BP 122/72 | HR 70 | Temp 98.3°F | Wt 177.0 lb

## 2019-01-15 DIAGNOSIS — L03011 Cellulitis of right finger: Secondary | ICD-10-CM

## 2019-01-15 NOTE — Patient Instructions (Signed)
Complete antibiotics  F/U Schedule Physical

## 2019-01-15 NOTE — Progress Notes (Signed)
   Subjective:    Patient ID: Edward Marquez, male    DOB: Nov 04, 1950, 68 y.o.   MRN: [id number]  Patient presents for Follow-up (finger infection)  Pt here for recheck on finger, seen 48 hours ago, s/p I&D, unknown cause of infection, splinter vs bite or infected abrasion  started on bactrim  swelling has gone down significantly, minimal drainage from center, redness has gone down, able to make full fist now No fever, no new symptoms    Review Of Systems:  GEN- denies fatigue, fever, weight loss,weakness, recent illness MSK- denies joint pain, muscle aches, injury        Objective:    BP 122/72   Pulse 70   Temp 98.3 F (36.8 C)   Wt 177 lb (80.3 kg)   BMI 25.40 kg/m  GEN- NAD, alert and oriented x3 EXT- right hand, decreased swelling of ring finger with minimal surrounding erythema between MIP and PIP, mild drainage from center I and D spot, mild TTP  MSK able to bend at PIP, MIP, DIP able to make complete fist       Assessment & Plan:      Problem List Items Addressed This Visit    None    Visit Diagnoses    Cellulitis of finger of right hand    -  Primary   much improved, infection clearing, Staph on the culture, on bactrim to cover MRSA, pt comfortable continuing to clean and bandage at home now      Note: This dictation was prepared with Dragon dictation along with smaller phrase technology. Any transcriptional errors that result from this process are unintentional.

## 2019-01-16 LAB — WOUND CULTURE
MICRO NUMBER:: 528397
SPECIMEN QUALITY:: ADEQUATE

## 2019-01-28 DIAGNOSIS — Z952 Presence of prosthetic heart valve: Secondary | ICD-10-CM | POA: Diagnosis not present

## 2019-01-28 DIAGNOSIS — Z7901 Long term (current) use of anticoagulants: Secondary | ICD-10-CM | POA: Diagnosis not present

## 2019-01-28 DIAGNOSIS — I4821 Permanent atrial fibrillation: Secondary | ICD-10-CM | POA: Diagnosis not present

## 2019-01-28 DIAGNOSIS — Z5181 Encounter for therapeutic drug level monitoring: Secondary | ICD-10-CM | POA: Diagnosis not present

## 2019-01-28 DIAGNOSIS — R791 Abnormal coagulation profile: Secondary | ICD-10-CM | POA: Diagnosis not present

## 2019-02-19 DIAGNOSIS — Z5181 Encounter for therapeutic drug level monitoring: Secondary | ICD-10-CM | POA: Diagnosis not present

## 2019-02-19 DIAGNOSIS — I4821 Permanent atrial fibrillation: Secondary | ICD-10-CM | POA: Diagnosis not present

## 2019-02-19 DIAGNOSIS — Z952 Presence of prosthetic heart valve: Secondary | ICD-10-CM | POA: Diagnosis not present

## 2019-02-19 DIAGNOSIS — R791 Abnormal coagulation profile: Secondary | ICD-10-CM | POA: Diagnosis not present

## 2019-02-19 DIAGNOSIS — Z7901 Long term (current) use of anticoagulants: Secondary | ICD-10-CM | POA: Diagnosis not present

## 2019-03-12 DIAGNOSIS — I4821 Permanent atrial fibrillation: Secondary | ICD-10-CM | POA: Diagnosis not present

## 2019-03-12 DIAGNOSIS — Z952 Presence of prosthetic heart valve: Secondary | ICD-10-CM | POA: Diagnosis not present

## 2019-03-30 ENCOUNTER — Other Ambulatory Visit: Payer: Self-pay

## 2019-03-31 ENCOUNTER — Ambulatory Visit (INDEPENDENT_AMBULATORY_CARE_PROVIDER_SITE_OTHER): Payer: Medicare Other | Admitting: Family Medicine

## 2019-03-31 ENCOUNTER — Encounter: Payer: Self-pay | Admitting: Family Medicine

## 2019-03-31 VITALS — BP 124/62 | HR 66 | Temp 98.1°F | Resp 14 | Ht 70.0 in | Wt 178.0 lb

## 2019-03-31 DIAGNOSIS — Z95810 Presence of automatic (implantable) cardiac defibrillator: Secondary | ICD-10-CM

## 2019-03-31 DIAGNOSIS — Z125 Encounter for screening for malignant neoplasm of prostate: Secondary | ICD-10-CM | POA: Diagnosis not present

## 2019-03-31 DIAGNOSIS — I739 Peripheral vascular disease, unspecified: Secondary | ICD-10-CM | POA: Diagnosis not present

## 2019-03-31 DIAGNOSIS — I119 Hypertensive heart disease without heart failure: Secondary | ICD-10-CM | POA: Diagnosis not present

## 2019-03-31 DIAGNOSIS — Z1211 Encounter for screening for malignant neoplasm of colon: Secondary | ICD-10-CM | POA: Diagnosis not present

## 2019-03-31 DIAGNOSIS — R7989 Other specified abnormal findings of blood chemistry: Secondary | ICD-10-CM | POA: Diagnosis not present

## 2019-03-31 DIAGNOSIS — Z0000 Encounter for general adult medical examination without abnormal findings: Secondary | ICD-10-CM | POA: Diagnosis not present

## 2019-03-31 DIAGNOSIS — Z23 Encounter for immunization: Secondary | ICD-10-CM

## 2019-03-31 DIAGNOSIS — Z0001 Encounter for general adult medical examination with abnormal findings: Secondary | ICD-10-CM | POA: Diagnosis not present

## 2019-03-31 DIAGNOSIS — Z13220 Encounter for screening for lipoid disorders: Secondary | ICD-10-CM | POA: Diagnosis not present

## 2019-03-31 NOTE — Progress Notes (Signed)
Subjective:   Patient presents for Medicare Annual/Subsequent preventive examination.    Pt here for wellness exam.  He has no particular concerns.  Medications reviewed his recent specialty notes reviewed at the bedside.   Review Past Medical/Family/Social: Per EMR   Risk Factors  Current exercise habits: Days active walks rides horses Dietary issues discussed: No major concerns  Cardiac risk factors: Hyperlipidemia, atrial fibrillation, peripheral arterial disease  Depression Screen  (Note: if answer to either of the following is "Yes", a more complete depression screening is indicated)  Over the past two weeks, have you felt down, depressed or hopeless? No Over the past two weeks, have you felt little interest or pleasure in doing things? No Have you lost interest or pleasure in daily life? No Do you often feel hopeless? No Do you cry easily over simple problems? No   Activities of Daily Living  In your present state of health, do you have any difficulty performing the following activities?:  Driving? No  Managing money? No  Feeding yourself? No  Getting from bed to chair? No  Climbing a flight of stairs? No  Preparing food and eating?: No  Bathing or showering? No  Getting dressed: No  Getting to the toilet? No  Using the toilet:No  Moving around from place to place: No  In the past year have you fallen or had a near fall?:No  Are you sexually active? No  Do you have more than one partner? No   Hearing Difficulties: No  Do you often ask people to speak up or repeat themselves? No  Do you experience ringing or noises in your ears? No Do you have difficulty understanding soft or whispered voices? No  Do you feel that you have a problem with memory? No Do you often misplace items? No  Do you feel safe at home? Yes  Cognitive Testing  Alert? Yes Normal Appearance?Yes  Oriented to person? Yes Place? Yes  Time? Yes  Recall of three objects? Yes  Can perform  simple calculations? Yes  Displays appropriate judgment?Yes  Can read the correct time from a watch face?Yes   List the Names of Other Physician/Practitioners you currently use:  Cardiology, vascular   Screening Tests / Date Colonoscopy     due                Shingrix due Pnemonia- UTD Influenza Vaccine DONE- at [facility]  Tetanus/tdap UTD  . GEN- denies fatigue, fever, weight loss,weakness, recent illness HEENT- denies eye drainage, change in vision, nasal discharge, CVS- denies chest pain, palpitations RESP- denies SOB, cough, wheeze ABD- denies N/V, change in stools, abd pain GU- denies dysuria, hematuria, dribbling, incontinence MSK- denies joint pain, muscle aches, injury Neuro- denies headache, dizziness, syncope, seizure activity  Physical: Vitals reviewed  GEN- NAD, alert and oriented x3 HEENT- PERRL, EOMI, non injected sclera, pink conjunctiva, MMM, oropharynx clear,TM clear bilat no effusion Neck- Supple, no thryomegaly , no bruit  CVS- RRR, no murmur RESP-CTAB ABD-NABS, Soft, NT,ND EXT- No edema Pulses- Radial, DP- 2+   Assessment:    Annual wellness medicare exam   Plan:    During the course of the visit the patient was educated and counseled about appropriate screening and preventive services including:   Preventative care will refer him for colonoscopy again.  He does have family history of colon cancer in his mother first-degree relative.  Prefer this over the Cologuard testing  Shingrix vaccine given today  Audit C/depression/fall screen negative  Atrial fibrillation with history of defibrillator placement followed by cardiology.  Coumadin also managed by cardiology he has not had any abnormal bleeding.  Hyperlipidemia he is not currently on the Lipitor recheck his lipid   Prostate cancer screening cancer screening to be done with PSA   Peripheral arterial disease fairly well controlled does get occasional pain in his feet.  He has repeat  imaging studies to be done this fall  Discussed advanced directives given handout he states that he is a DNR Diet review for nutrition referral? Yes ____ Not Indicated __x__  Patient Instructions (the written plan) was given to the patient.  Medicare Attestation  I have personally reviewed:  The patient's medical and social history  Their use of alcohol, tobacco or illicit drugs  Their current medications and supplements  The patient's functional ability including ADLs,fall risks, home safety risks, cognitive, and hearing and visual impairment  Diet and physical activities  Evidence for depression or mood disorders  The patient's weight, height, BMI, and visual acuity have been recorded in the chart. I have made referrals, counseling, and provided education to the patient based on review of the above and I have provided the patient with a written personalized care plan for preventive services.

## 2019-03-31 NOTE — Patient Instructions (Addendum)
Referral for colonoscopy  F/u 6 MONTHS

## 2019-04-01 LAB — COMPREHENSIVE METABOLIC PANEL
AG Ratio: 1.2 (calc) (ref 1.0–2.5)
ALT: 31 U/L (ref 9–46)
AST: 22 U/L (ref 10–35)
Albumin: 4.1 g/dL (ref 3.6–5.1)
Alkaline phosphatase (APISO): 58 U/L (ref 35–144)
BUN: 20 mg/dL (ref 7–25)
CO2: 25 mmol/L (ref 20–32)
Calcium: 9.7 mg/dL (ref 8.6–10.3)
Chloride: 102 mmol/L (ref 98–110)
Creat: 0.93 mg/dL (ref 0.70–1.25)
Globulin: 3.4 g/dL (calc) (ref 1.9–3.7)
Glucose, Bld: 108 mg/dL — ABNORMAL HIGH (ref 65–99)
Potassium: 4.6 mmol/L (ref 3.5–5.3)
Sodium: 137 mmol/L (ref 135–146)
Total Bilirubin: 0.7 mg/dL (ref 0.2–1.2)
Total Protein: 7.5 g/dL (ref 6.1–8.1)

## 2019-04-01 LAB — LIPID PANEL
Cholesterol: 211 mg/dL — ABNORMAL HIGH (ref <200)
HDL: 46 mg/dL (ref >=40)
LDL Cholesterol (Calc): 134 mg/dL (calc) — ABNORMAL HIGH
Non-HDL Cholesterol (Calc): 165 mg/dL (calc) — ABNORMAL HIGH (ref <130)
Total CHOL/HDL Ratio: 4.6 (calc) (ref <5.0)
Triglycerides: 171 mg/dL — ABNORMAL HIGH (ref <150)

## 2019-04-01 LAB — CBC WITH DIFFERENTIAL/PLATELET
Absolute Monocytes: 660 cells/uL (ref 200–950)
Basophils Absolute: 71 cells/uL (ref 0–200)
Basophils Relative: 1 %
Eosinophils Absolute: 71 cells/uL (ref 15–500)
Eosinophils Relative: 1 %
HCT: 43.1 % (ref 38.5–50.0)
Hemoglobin: 14.9 g/dL (ref 13.2–17.1)
Lymphs Abs: 1377 cells/uL (ref 850–3900)
MCH: 33.1 pg — ABNORMAL HIGH (ref 27.0–33.0)
MCHC: 34.6 g/dL (ref 32.0–36.0)
MCV: 95.8 fL (ref 80.0–100.0)
MPV: 10.6 fL (ref 7.5–12.5)
Monocytes Relative: 9.3 %
Neutro Abs: 4920 cells/uL (ref 1500–7800)
Neutrophils Relative %: 69.3 %
Platelets: 184 10*3/uL (ref 140–400)
RBC: 4.5 10*6/uL (ref 4.20–5.80)
RDW: 12.7 % (ref 11.0–15.0)
Total Lymphocyte: 19.4 %
WBC: 7.1 10*3/uL (ref 3.8–10.8)

## 2019-04-01 LAB — PSA: PSA: 1.2 ng/mL (ref <=4.0)

## 2019-04-03 ENCOUNTER — Other Ambulatory Visit: Payer: Self-pay

## 2019-04-03 MED ORDER — ATORVASTATIN CALCIUM 20 MG PO TABS: 20.0000 mg | ORAL_TABLET | Freq: Every day | ORAL | 0 refills | Status: DC

## 2019-04-07 ENCOUNTER — Encounter: Payer: Self-pay | Admitting: Gastroenterology

## 2019-04-29 DIAGNOSIS — I4821 Permanent atrial fibrillation: Secondary | ICD-10-CM | POA: Diagnosis not present

## 2019-04-29 DIAGNOSIS — R791 Abnormal coagulation profile: Secondary | ICD-10-CM | POA: Diagnosis not present

## 2019-04-29 DIAGNOSIS — Z952 Presence of prosthetic heart valve: Secondary | ICD-10-CM | POA: Diagnosis not present

## 2019-04-29 DIAGNOSIS — Z7901 Long term (current) use of anticoagulants: Secondary | ICD-10-CM | POA: Diagnosis not present

## 2019-05-04 ENCOUNTER — Encounter: Payer: Self-pay | Admitting: Gastroenterology

## 2019-05-04 ENCOUNTER — Ambulatory Visit: Payer: Medicare Other | Admitting: Gastroenterology

## 2019-05-08 DIAGNOSIS — Z95810 Presence of automatic (implantable) cardiac defibrillator: Secondary | ICD-10-CM | POA: Diagnosis not present

## 2019-05-29 DIAGNOSIS — Z952 Presence of prosthetic heart valve: Secondary | ICD-10-CM | POA: Diagnosis not present

## 2019-05-29 DIAGNOSIS — I420 Dilated cardiomyopathy: Secondary | ICD-10-CM | POA: Diagnosis not present

## 2019-05-29 DIAGNOSIS — Z4502 Encounter for adjustment and management of automatic implantable cardiac defibrillator: Secondary | ICD-10-CM | POA: Diagnosis not present

## 2019-05-29 DIAGNOSIS — Z95810 Presence of automatic (implantable) cardiac defibrillator: Secondary | ICD-10-CM | POA: Diagnosis not present

## 2019-05-29 DIAGNOSIS — Z7901 Long term (current) use of anticoagulants: Secondary | ICD-10-CM | POA: Diagnosis not present

## 2019-06-02 DIAGNOSIS — Z952 Presence of prosthetic heart valve: Secondary | ICD-10-CM | POA: Diagnosis not present

## 2019-06-02 DIAGNOSIS — I4821 Permanent atrial fibrillation: Secondary | ICD-10-CM | POA: Diagnosis not present

## 2019-06-18 DIAGNOSIS — Z01812 Encounter for preprocedural laboratory examination: Secondary | ICD-10-CM | POA: Diagnosis not present

## 2019-06-18 DIAGNOSIS — I517 Cardiomegaly: Secondary | ICD-10-CM | POA: Diagnosis not present

## 2019-06-18 DIAGNOSIS — Z4502 Encounter for adjustment and management of automatic implantable cardiac defibrillator: Secondary | ICD-10-CM | POA: Diagnosis not present

## 2019-06-23 DIAGNOSIS — I472 Ventricular tachycardia: Secondary | ICD-10-CM | POA: Diagnosis not present

## 2019-06-23 DIAGNOSIS — Z4502 Encounter for adjustment and management of automatic implantable cardiac defibrillator: Secondary | ICD-10-CM | POA: Diagnosis not present

## 2019-06-23 DIAGNOSIS — I480 Paroxysmal atrial fibrillation: Secondary | ICD-10-CM | POA: Diagnosis not present

## 2019-06-23 DIAGNOSIS — Z7901 Long term (current) use of anticoagulants: Secondary | ICD-10-CM | POA: Diagnosis not present

## 2019-06-23 DIAGNOSIS — I428 Other cardiomyopathies: Secondary | ICD-10-CM | POA: Diagnosis not present

## 2019-06-23 DIAGNOSIS — I10 Essential (primary) hypertension: Secondary | ICD-10-CM | POA: Diagnosis not present

## 2019-06-23 DIAGNOSIS — I447 Left bundle-branch block, unspecified: Secondary | ICD-10-CM | POA: Diagnosis not present

## 2019-06-24 DIAGNOSIS — I10 Essential (primary) hypertension: Secondary | ICD-10-CM | POA: Diagnosis not present

## 2019-06-24 DIAGNOSIS — Z4502 Encounter for adjustment and management of automatic implantable cardiac defibrillator: Secondary | ICD-10-CM | POA: Diagnosis not present

## 2019-06-24 DIAGNOSIS — I472 Ventricular tachycardia: Secondary | ICD-10-CM | POA: Diagnosis not present

## 2019-06-24 DIAGNOSIS — I428 Other cardiomyopathies: Secondary | ICD-10-CM | POA: Diagnosis not present

## 2019-06-24 DIAGNOSIS — R001 Bradycardia, unspecified: Secondary | ICD-10-CM | POA: Diagnosis not present

## 2019-06-24 DIAGNOSIS — I480 Paroxysmal atrial fibrillation: Secondary | ICD-10-CM | POA: Diagnosis not present

## 2019-06-24 DIAGNOSIS — I447 Left bundle-branch block, unspecified: Secondary | ICD-10-CM | POA: Diagnosis not present

## 2019-06-24 DIAGNOSIS — Z7901 Long term (current) use of anticoagulants: Secondary | ICD-10-CM | POA: Diagnosis not present

## 2019-06-30 DIAGNOSIS — Z952 Presence of prosthetic heart valve: Secondary | ICD-10-CM | POA: Diagnosis not present

## 2019-06-30 DIAGNOSIS — R791 Abnormal coagulation profile: Secondary | ICD-10-CM | POA: Diagnosis not present

## 2019-06-30 DIAGNOSIS — Z7901 Long term (current) use of anticoagulants: Secondary | ICD-10-CM | POA: Diagnosis not present

## 2019-06-30 DIAGNOSIS — Z5181 Encounter for therapeutic drug level monitoring: Secondary | ICD-10-CM | POA: Diagnosis not present

## 2019-06-30 DIAGNOSIS — I4821 Permanent atrial fibrillation: Secondary | ICD-10-CM | POA: Diagnosis not present

## 2019-07-06 ENCOUNTER — Other Ambulatory Visit: Payer: Medicare Other

## 2019-07-06 ENCOUNTER — Other Ambulatory Visit: Payer: Self-pay | Admitting: Family Medicine

## 2019-07-06 DIAGNOSIS — R7301 Impaired fasting glucose: Secondary | ICD-10-CM

## 2019-07-06 DIAGNOSIS — E785 Hyperlipidemia, unspecified: Secondary | ICD-10-CM

## 2019-07-14 DIAGNOSIS — I739 Peripheral vascular disease, unspecified: Secondary | ICD-10-CM | POA: Diagnosis not present

## 2019-07-14 DIAGNOSIS — Z95820 Peripheral vascular angioplasty status with implants and grafts: Secondary | ICD-10-CM | POA: Diagnosis not present

## 2019-07-22 DIAGNOSIS — I4821 Permanent atrial fibrillation: Secondary | ICD-10-CM | POA: Diagnosis not present

## 2019-07-22 DIAGNOSIS — Z952 Presence of prosthetic heart valve: Secondary | ICD-10-CM | POA: Diagnosis not present

## 2019-07-22 DIAGNOSIS — Z7901 Long term (current) use of anticoagulants: Secondary | ICD-10-CM | POA: Diagnosis not present

## 2019-07-22 DIAGNOSIS — Z5181 Encounter for therapeutic drug level monitoring: Secondary | ICD-10-CM | POA: Diagnosis not present

## 2019-08-07 DIAGNOSIS — Z95810 Presence of automatic (implantable) cardiac defibrillator: Secondary | ICD-10-CM | POA: Diagnosis not present

## 2019-08-20 DIAGNOSIS — I4821 Permanent atrial fibrillation: Secondary | ICD-10-CM | POA: Diagnosis not present

## 2019-08-20 DIAGNOSIS — Z952 Presence of prosthetic heart valve: Secondary | ICD-10-CM | POA: Diagnosis not present

## 2019-08-20 DIAGNOSIS — R791 Abnormal coagulation profile: Secondary | ICD-10-CM | POA: Diagnosis not present

## 2019-08-20 DIAGNOSIS — Z5181 Encounter for therapeutic drug level monitoring: Secondary | ICD-10-CM | POA: Diagnosis not present

## 2019-08-20 DIAGNOSIS — Z7901 Long term (current) use of anticoagulants: Secondary | ICD-10-CM | POA: Diagnosis not present

## 2019-08-27 DIAGNOSIS — Z952 Presence of prosthetic heart valve: Secondary | ICD-10-CM | POA: Diagnosis not present

## 2019-08-27 DIAGNOSIS — R791 Abnormal coagulation profile: Secondary | ICD-10-CM | POA: Diagnosis not present

## 2019-08-27 DIAGNOSIS — I4821 Permanent atrial fibrillation: Secondary | ICD-10-CM | POA: Diagnosis not present

## 2019-09-10 DIAGNOSIS — I4821 Permanent atrial fibrillation: Secondary | ICD-10-CM | POA: Diagnosis not present

## 2019-09-10 DIAGNOSIS — Z952 Presence of prosthetic heart valve: Secondary | ICD-10-CM | POA: Diagnosis not present

## 2019-11-18 DIAGNOSIS — Z4502 Encounter for adjustment and management of automatic implantable cardiac defibrillator: Secondary | ICD-10-CM | POA: Diagnosis not present

## 2019-11-18 DIAGNOSIS — I428 Other cardiomyopathies: Secondary | ICD-10-CM | POA: Diagnosis not present

## 2020-02-08 DIAGNOSIS — I4811 Longstanding persistent atrial fibrillation: Secondary | ICD-10-CM | POA: Diagnosis not present

## 2020-02-08 DIAGNOSIS — Z7901 Long term (current) use of anticoagulants: Secondary | ICD-10-CM | POA: Diagnosis not present

## 2020-02-08 DIAGNOSIS — Z5181 Encounter for therapeutic drug level monitoring: Secondary | ICD-10-CM | POA: Diagnosis not present

## 2020-02-08 DIAGNOSIS — Z952 Presence of prosthetic heart valve: Secondary | ICD-10-CM | POA: Diagnosis not present

## 2020-02-24 DIAGNOSIS — Z4502 Encounter for adjustment and management of automatic implantable cardiac defibrillator: Secondary | ICD-10-CM | POA: Diagnosis not present

## 2020-03-29 DIAGNOSIS — Z952 Presence of prosthetic heart valve: Secondary | ICD-10-CM | POA: Diagnosis not present

## 2020-03-29 DIAGNOSIS — R791 Abnormal coagulation profile: Secondary | ICD-10-CM | POA: Diagnosis not present

## 2020-03-29 DIAGNOSIS — Z7901 Long term (current) use of anticoagulants: Secondary | ICD-10-CM | POA: Diagnosis not present

## 2020-03-29 DIAGNOSIS — I4891 Unspecified atrial fibrillation: Secondary | ICD-10-CM | POA: Diagnosis not present

## 2020-03-29 DIAGNOSIS — Z5181 Encounter for therapeutic drug level monitoring: Secondary | ICD-10-CM | POA: Diagnosis not present

## 2020-03-31 DIAGNOSIS — Z952 Presence of prosthetic heart valve: Secondary | ICD-10-CM | POA: Diagnosis not present

## 2020-03-31 DIAGNOSIS — I4891 Unspecified atrial fibrillation: Secondary | ICD-10-CM | POA: Diagnosis not present

## 2020-03-31 DIAGNOSIS — Z5181 Encounter for therapeutic drug level monitoring: Secondary | ICD-10-CM | POA: Diagnosis not present

## 2020-03-31 DIAGNOSIS — R791 Abnormal coagulation profile: Secondary | ICD-10-CM | POA: Diagnosis not present

## 2020-03-31 DIAGNOSIS — Z7901 Long term (current) use of anticoagulants: Secondary | ICD-10-CM | POA: Diagnosis not present

## 2020-04-07 DIAGNOSIS — R791 Abnormal coagulation profile: Secondary | ICD-10-CM | POA: Diagnosis not present

## 2020-04-07 DIAGNOSIS — Z7901 Long term (current) use of anticoagulants: Secondary | ICD-10-CM | POA: Diagnosis not present

## 2020-04-07 DIAGNOSIS — Z5181 Encounter for therapeutic drug level monitoring: Secondary | ICD-10-CM | POA: Diagnosis not present

## 2020-04-07 DIAGNOSIS — I4891 Unspecified atrial fibrillation: Secondary | ICD-10-CM | POA: Diagnosis not present

## 2020-04-07 DIAGNOSIS — Z952 Presence of prosthetic heart valve: Secondary | ICD-10-CM | POA: Diagnosis not present

## 2020-04-12 DIAGNOSIS — Z952 Presence of prosthetic heart valve: Secondary | ICD-10-CM | POA: Diagnosis not present

## 2020-04-12 DIAGNOSIS — I4891 Unspecified atrial fibrillation: Secondary | ICD-10-CM | POA: Diagnosis not present

## 2020-04-12 DIAGNOSIS — Z5181 Encounter for therapeutic drug level monitoring: Secondary | ICD-10-CM | POA: Diagnosis not present

## 2020-04-12 DIAGNOSIS — R791 Abnormal coagulation profile: Secondary | ICD-10-CM | POA: Diagnosis not present

## 2020-04-12 DIAGNOSIS — Z7901 Long term (current) use of anticoagulants: Secondary | ICD-10-CM | POA: Diagnosis not present

## 2020-04-20 DIAGNOSIS — I4891 Unspecified atrial fibrillation: Secondary | ICD-10-CM | POA: Diagnosis not present

## 2020-04-20 DIAGNOSIS — Z952 Presence of prosthetic heart valve: Secondary | ICD-10-CM | POA: Diagnosis not present

## 2020-04-22 DIAGNOSIS — I4891 Unspecified atrial fibrillation: Secondary | ICD-10-CM | POA: Diagnosis not present

## 2020-04-22 DIAGNOSIS — I498 Other specified cardiac arrhythmias: Secondary | ICD-10-CM | POA: Diagnosis not present

## 2020-04-22 DIAGNOSIS — R9431 Abnormal electrocardiogram [ECG] [EKG]: Secondary | ICD-10-CM | POA: Diagnosis not present

## 2020-04-22 DIAGNOSIS — Z9889 Other specified postprocedural states: Secondary | ICD-10-CM | POA: Diagnosis not present

## 2020-04-22 DIAGNOSIS — I493 Ventricular premature depolarization: Secondary | ICD-10-CM | POA: Diagnosis not present

## 2020-05-27 DIAGNOSIS — Z7901 Long term (current) use of anticoagulants: Secondary | ICD-10-CM | POA: Diagnosis not present

## 2020-05-27 DIAGNOSIS — R791 Abnormal coagulation profile: Secondary | ICD-10-CM | POA: Diagnosis not present

## 2020-05-27 DIAGNOSIS — I4891 Unspecified atrial fibrillation: Secondary | ICD-10-CM | POA: Diagnosis not present

## 2020-05-27 DIAGNOSIS — Z952 Presence of prosthetic heart valve: Secondary | ICD-10-CM | POA: Diagnosis not present

## 2020-05-27 DIAGNOSIS — Z5181 Encounter for therapeutic drug level monitoring: Secondary | ICD-10-CM | POA: Diagnosis not present

## 2020-06-09 DIAGNOSIS — I4891 Unspecified atrial fibrillation: Secondary | ICD-10-CM | POA: Diagnosis not present

## 2020-06-09 DIAGNOSIS — Z952 Presence of prosthetic heart valve: Secondary | ICD-10-CM | POA: Diagnosis not present

## 2020-06-09 DIAGNOSIS — Z7901 Long term (current) use of anticoagulants: Secondary | ICD-10-CM | POA: Diagnosis not present

## 2020-06-09 DIAGNOSIS — R791 Abnormal coagulation profile: Secondary | ICD-10-CM | POA: Diagnosis not present

## 2020-06-10 DIAGNOSIS — Z952 Presence of prosthetic heart valve: Secondary | ICD-10-CM | POA: Diagnosis not present

## 2020-06-10 DIAGNOSIS — I4891 Unspecified atrial fibrillation: Secondary | ICD-10-CM | POA: Diagnosis not present

## 2020-06-30 DIAGNOSIS — I4891 Unspecified atrial fibrillation: Secondary | ICD-10-CM | POA: Diagnosis not present

## 2020-06-30 DIAGNOSIS — Z952 Presence of prosthetic heart valve: Secondary | ICD-10-CM | POA: Diagnosis not present

## 2020-06-30 DIAGNOSIS — Z7901 Long term (current) use of anticoagulants: Secondary | ICD-10-CM | POA: Diagnosis not present

## 2020-06-30 DIAGNOSIS — R791 Abnormal coagulation profile: Secondary | ICD-10-CM | POA: Diagnosis not present

## 2020-08-04 DIAGNOSIS — Z952 Presence of prosthetic heart valve: Secondary | ICD-10-CM | POA: Diagnosis not present

## 2020-08-04 DIAGNOSIS — I4891 Unspecified atrial fibrillation: Secondary | ICD-10-CM | POA: Diagnosis not present

## 2020-08-04 DIAGNOSIS — Z7901 Long term (current) use of anticoagulants: Secondary | ICD-10-CM | POA: Diagnosis not present

## 2020-08-24 DIAGNOSIS — Z95810 Presence of automatic (implantable) cardiac defibrillator: Secondary | ICD-10-CM | POA: Diagnosis not present

## 2020-09-01 DIAGNOSIS — Z5181 Encounter for therapeutic drug level monitoring: Secondary | ICD-10-CM | POA: Diagnosis not present

## 2020-09-01 DIAGNOSIS — Z7901 Long term (current) use of anticoagulants: Secondary | ICD-10-CM | POA: Diagnosis not present

## 2020-09-01 DIAGNOSIS — Z952 Presence of prosthetic heart valve: Secondary | ICD-10-CM | POA: Diagnosis not present

## 2020-09-01 DIAGNOSIS — I4891 Unspecified atrial fibrillation: Secondary | ICD-10-CM | POA: Diagnosis not present

## 2020-09-06 DIAGNOSIS — I739 Peripheral vascular disease, unspecified: Secondary | ICD-10-CM | POA: Diagnosis not present

## 2020-09-06 DIAGNOSIS — Z95820 Peripheral vascular angioplasty status with implants and grafts: Secondary | ICD-10-CM | POA: Diagnosis not present

## 2020-10-04 DIAGNOSIS — Z5181 Encounter for therapeutic drug level monitoring: Secondary | ICD-10-CM | POA: Diagnosis not present

## 2020-10-04 DIAGNOSIS — I4891 Unspecified atrial fibrillation: Secondary | ICD-10-CM | POA: Diagnosis not present

## 2020-10-04 DIAGNOSIS — Z952 Presence of prosthetic heart valve: Secondary | ICD-10-CM | POA: Diagnosis not present

## 2020-10-04 DIAGNOSIS — Z7901 Long term (current) use of anticoagulants: Secondary | ICD-10-CM | POA: Diagnosis not present

## 2020-11-23 DIAGNOSIS — Z95810 Presence of automatic (implantable) cardiac defibrillator: Secondary | ICD-10-CM | POA: Diagnosis not present

## 2021-01-31 DIAGNOSIS — Z7901 Long term (current) use of anticoagulants: Secondary | ICD-10-CM | POA: Diagnosis not present

## 2021-01-31 DIAGNOSIS — Z5181 Encounter for therapeutic drug level monitoring: Secondary | ICD-10-CM | POA: Diagnosis not present

## 2021-01-31 DIAGNOSIS — Z952 Presence of prosthetic heart valve: Secondary | ICD-10-CM | POA: Diagnosis not present

## 2021-01-31 DIAGNOSIS — I4891 Unspecified atrial fibrillation: Secondary | ICD-10-CM | POA: Diagnosis not present

## 2021-02-24 ENCOUNTER — Telehealth: Payer: Self-pay | Admitting: Family Medicine

## 2021-02-24 ENCOUNTER — Telehealth: Payer: Self-pay | Admitting: *Deleted

## 2021-02-24 MED ORDER — NIRMATRELVIR/RITONAVIR (PAXLOVID)TABLET: 3.0000 | ORAL_TABLET | Freq: Two times a day (BID) | ORAL | 0 refills | Status: AC

## 2021-02-24 NOTE — Telephone Encounter (Signed)
Patient was last seen by Dr Buelah Manis on 03/31/2019.  He is requesting transfer of care.   Would you be able to accept him as a patient?

## 2021-02-24 NOTE — Telephone Encounter (Signed)
Patient last appointment 03/31/2019.  Patient tested positive for COVID on 02/24/2021.   Sx began on 02/21/2021 and include post nasal drip, scratchy throat, and mild cough. Denies fever, HA, body aches,   Advised to continue symptom management with OTC medications: Tylenol/ Ibuprofen for fever/ body aches, Mucinex/ Delsym for cough/ chest congestion, Afrin/Sudafed/nasal saline for sinus pressure/ nasal congestion  GFR >90. Order for Paxlovid sent to pharmacy. Advised possible SE include nausea, diarrhea, loss of taste and hypertension.  If chest pain, shortness of breath, fever >104 that is unresponsive to antipyretics noted, or if patient is unable to tolerate fluids, advised to go to ER for evaluation.   Advised that the CDC recommends the following criteria prior to ending isolation in vaccinated persons at least 5 days since symptoms onset, then an additional 5 days of masking,  AND 3 days fever free without antipyretics (Tylenol or Ibuprofen) AND improvement in respiratory symptoms.

## 2021-02-24 NOTE — Telephone Encounter (Signed)
Pt called in and is a former pt of Dr. Janeann Forehand, pt stated that he tested positive for COVID, and just wanted some advice as to what he could take or what he should do. Please advise  Cb#" [phone]

## 2021-02-27 NOTE — Telephone Encounter (Signed)
New patient appt scheduled for 9/21 to establish care.

## 2021-03-10 DIAGNOSIS — Z5181 Encounter for therapeutic drug level monitoring: Secondary | ICD-10-CM | POA: Diagnosis not present

## 2021-03-10 DIAGNOSIS — I4821 Permanent atrial fibrillation: Secondary | ICD-10-CM | POA: Diagnosis not present

## 2021-03-10 DIAGNOSIS — Z7901 Long term (current) use of anticoagulants: Secondary | ICD-10-CM | POA: Diagnosis not present

## 2021-03-10 DIAGNOSIS — Z952 Presence of prosthetic heart valve: Secondary | ICD-10-CM | POA: Diagnosis not present

## 2021-03-15 DIAGNOSIS — Z952 Presence of prosthetic heart valve: Secondary | ICD-10-CM | POA: Diagnosis not present

## 2021-03-15 DIAGNOSIS — I4821 Permanent atrial fibrillation: Secondary | ICD-10-CM | POA: Diagnosis not present

## 2021-03-15 DIAGNOSIS — Z95810 Presence of automatic (implantable) cardiac defibrillator: Secondary | ICD-10-CM | POA: Diagnosis not present

## 2021-03-15 DIAGNOSIS — I429 Cardiomyopathy, unspecified: Secondary | ICD-10-CM | POA: Diagnosis not present

## 2021-04-26 DIAGNOSIS — Z952 Presence of prosthetic heart valve: Secondary | ICD-10-CM | POA: Diagnosis not present

## 2021-04-26 DIAGNOSIS — I4821 Permanent atrial fibrillation: Secondary | ICD-10-CM | POA: Diagnosis not present

## 2021-05-02 NOTE — Progress Notes (Deleted)
Subjective:    Patient ID: Edward Marquez, male    DOB: Apr 14, 1951, 70 y.o.   MRN: [id number]  HPI: Edward Marquez is a 70 y.o. male presenting for  No chief complaint on file.  Has not been seen in our office in 2 years.   HYPERLIPIDEMIA Hyperlipidemia status: {Blank single:19197::"excellent compliance","good compliance","fair compliance","poor compliance"} Satisfied with current treatment?  {Blank single:19197::"yes","no"} Side effects:  {Blank single:19197::"yes","no"} Medication compliance: {Blank single:19197::"excellent compliance","good compliance","fair compliance","poor compliance"} Past cholesterol meds:  Aspirin:  {Blank single:19197::"yes","no"} The 10-year ASCVD risk score (Arnett DK, et al., 2019) is: 18.8%   Values used to calculate the score:     Age: 82 years     Sex: Male     Is Non-Hispanic African American: No     Diabetic: No     Tobacco smoker: No     Systolic Blood Pressure: 628 mmHg     Is BP treated: Yes     HDL Cholesterol: 46 mg/dL     Total Cholesterol: 211 mg/dL Chest pain:  {Blank single:19197::"yes","no"} Coronary artery disease:  {Blank single:19197::"yes","no"} Family history CAD:  {Blank single:19197::"yes","no"} Family history early CAD:  {Blank single:19197::"yes","no"}   ATRIAL FIBRILLATION Atrial fibrillation status: {Blank single:19197::"controlled","uncontrolled","better","worse","exacerbated","stable"} Satisfied with current treatment: {Blank single:19197::"yes","no"}  Medication side effects:  {Blank single:19197::"yes","no"} Medication compliance: {Blank single:19197::"excellent compliance","good compliance","fair compliance","poor compliance"} Etiology of atrial fibrillation:  Palpitations:  {Blank single:19197::"yes","no"} Chest pain:  {Blank single:19197::"yes","no"} Dyspnea on exertion:  {Blank single:19197::"yes","no"} Orthopnea:  {Blank single:19197::"yes","no"} Syncope:  {Blank single:19197::"yes","no"} Edema:  {Blank  single:19197::"yes","no"} Ventricular rate control: {Blank single:19197::"Not indicated","diltiazem","verapamil","B-blocker"} Anti-coagulation: {Blank single:19197::"not indicated","aspirin","warfarin","long acting"}  PERIPHERAL VASCULAR DISORDER Follows with [facility]     Allergies  Allergen Reactions   Ivp Dye [Iodinated Diagnostic Agents] Hives    Outpatient Encounter Medications as of 05/03/2021  Medication Sig   atorvastatin (LIPITOR) 20 MG tablet Take 1 tablet (20 mg total) by mouth daily.   diltiazem (CARDIZEM CD) 120 MG 24 hr capsule Take 120 mg 2 (two) times daily by mouth.    dofetilide (TIKOSYN) 500 MCG capsule Take 500 mcg by mouth 2 (two) times daily.    ibuprofen (ADVIL,MOTRIN) 200 MG tablet Take 400 mg every 6 (six) hours as needed by mouth for headache or moderate pain.   metoprolol succinate (TOPROL-XL) 100 MG 24 hr tablet Take 100 mg by mouth daily. Take with or immediately following a meal.   ramipril (ALTACE) 2.5 MG capsule Take 2.5 mg by mouth daily.   warfarin (COUMADIN) 7.5 MG tablet Take 3.75-7.5 mg See admin instructions by mouth. Take 7.5 mg by mouth daily in the evening except Tue and Sat take 3.75 mg daily in the evening   No facility-administered encounter medications on file as of 05/03/2021.    Patient Active Problem List   Diagnosis Date Noted   Atrial fibrillation ([place]) 08/14/2017   History of implantable cardioverter-defibrillator (ICD) placement 08/14/2017   PAD (peripheral artery disease) ([place]) 05/03/2017   PVD (peripheral vascular disease) ([place]) 04/30/2017    Past Medical History:  Diagnosis Date   Aortic valve defect    Atrial fibrillation ([facility])    CHF (congestive heart failure) ([facility])    Clotting disorder ([facility])    Gout    Hypertension    Peripheral vascular disease ([place])     Relevant past medical, surgical, family and social history reviewed and updated as indicated. Interim medical history since our last visit  reviewed.  Review of Systems Per HPI unless specifically indicated above  Objective:    There were no vitals taken for this visit.  Wt Readings from Last 3 Encounters:  03/31/19 178 lb (80.7 kg)  01/15/19 177 lb (80.3 kg)  01/13/19 177 lb 6.4 oz (80.5 kg)    Physical Exam  Results for orders placed or performed in visit on 03/31/19  CBC with Differential/Platelet  Result Value Ref Range   WBC 7.1 3.8 - 10.8 Thousand/uL   RBC 4.50 4.20 - 5.80 Million/uL   Hemoglobin 14.9 13.2 - 17.1 g/dL   HCT 43.1 38.5 - 50.0 %   MCV 95.8 80.0 - 100.0 fL   MCH 33.1 (H) 27.0 - 33.0 pg   MCHC 34.6 32.0 - 36.0 g/dL   RDW 12.7 11.0 - 15.0 %   Platelets 184 140 - 400 Thousand/uL   MPV 10.6 7.5 - 12.5 fL   Neutro Abs 4,920 1,500 - 7,800 cells/uL   Lymphs Abs 1,377 850 - 3,900 cells/uL   Absolute Monocytes 660 200 - 950 cells/uL   Eosinophils Absolute 71 15 - 500 cells/uL   Basophils Absolute 71 0 - 200 cells/uL   Neutrophils Relative % 69.3 %   Total Lymphocyte 19.4 %   Monocytes Relative 9.3 %   Eosinophils Relative 1.0 %   Basophils Relative 1.0 %  Comprehensive metabolic panel  Result Value Ref Range   Glucose, Bld 108 (H) 65 - 99 mg/dL   BUN 20 7 - 25 mg/dL   Creat 0.93 0.70 - 1.25 mg/dL   BUN/Creatinine Ratio NOT APPLICABLE 6 - 22 (calc)   Sodium 137 135 - 146 mmol/L   Potassium 4.6 3.5 - 5.3 mmol/L   Chloride 102 98 - 110 mmol/L   CO2 25 20 - 32 mmol/L   Calcium 9.7 8.6 - 10.3 mg/dL   Total Protein 7.5 6.1 - 8.1 g/dL   Albumin 4.1 3.6 - 5.1 g/dL   Globulin 3.4 1.9 - 3.7 g/dL (calc)   AG Ratio 1.2 1.0 - 2.5 (calc)   Total Bilirubin 0.7 0.2 - 1.2 mg/dL   Alkaline phosphatase (APISO) 58 35 - 144 U/L   AST 22 10 - 35 U/L   ALT 31 9 - 46 U/L  Lipid panel  Result Value Ref Range   Cholesterol 211 (H) <200 mg/dL   HDL 46 > OR = 40 mg/dL   Triglycerides 171 (H) <150 mg/dL   LDL Cholesterol (Calc) 134 (H) mg/dL (calc)   Total CHOL/HDL Ratio 4.6 <5.0 (calc)   Non-HDL  Cholesterol (Calc) 165 (H) <130 mg/dL (calc)  PSA  Result Value Ref Range   PSA 1.2 < OR = 4.0 ng/mL      Assessment & Plan:   Problem List Items Addressed This Visit   None    Follow up plan: No follow-ups on file.

## 2021-05-03 ENCOUNTER — Ambulatory Visit: Payer: Medicare Other | Admitting: Nurse Practitioner

## 2021-07-10 DIAGNOSIS — M7742 Metatarsalgia, left foot: Secondary | ICD-10-CM | POA: Diagnosis not present

## 2021-07-10 DIAGNOSIS — S90424A Blister (nonthermal), right lesser toe(s), initial encounter: Secondary | ICD-10-CM | POA: Diagnosis not present

## 2021-07-10 DIAGNOSIS — M2041 Other hammer toe(s) (acquired), right foot: Secondary | ICD-10-CM | POA: Diagnosis not present

## 2021-07-10 DIAGNOSIS — M2042 Other hammer toe(s) (acquired), left foot: Secondary | ICD-10-CM | POA: Diagnosis not present

## 2021-07-10 DIAGNOSIS — M21612 Bunion of left foot: Secondary | ICD-10-CM | POA: Diagnosis not present

## 2021-07-10 DIAGNOSIS — M21611 Bunion of right foot: Secondary | ICD-10-CM | POA: Diagnosis not present

## 2021-07-10 DIAGNOSIS — M7741 Metatarsalgia, right foot: Secondary | ICD-10-CM | POA: Diagnosis not present

## 2021-08-31 DIAGNOSIS — Z5181 Encounter for therapeutic drug level monitoring: Secondary | ICD-10-CM | POA: Diagnosis not present

## 2021-08-31 DIAGNOSIS — Z952 Presence of prosthetic heart valve: Secondary | ICD-10-CM | POA: Diagnosis not present

## 2021-08-31 DIAGNOSIS — I4821 Permanent atrial fibrillation: Secondary | ICD-10-CM | POA: Diagnosis not present

## 2021-08-31 DIAGNOSIS — Z7901 Long term (current) use of anticoagulants: Secondary | ICD-10-CM | POA: Diagnosis not present

## 2021-09-07 DIAGNOSIS — Z95820 Peripheral vascular angioplasty status with implants and grafts: Secondary | ICD-10-CM | POA: Diagnosis not present

## 2021-09-07 DIAGNOSIS — Z7901 Long term (current) use of anticoagulants: Secondary | ICD-10-CM | POA: Diagnosis not present

## 2021-09-07 DIAGNOSIS — I739 Peripheral vascular disease, unspecified: Secondary | ICD-10-CM | POA: Diagnosis not present

## 2021-10-24 DIAGNOSIS — Z952 Presence of prosthetic heart valve: Secondary | ICD-10-CM | POA: Diagnosis not present

## 2021-10-24 DIAGNOSIS — Z7901 Long term (current) use of anticoagulants: Secondary | ICD-10-CM | POA: Diagnosis not present

## 2021-10-24 DIAGNOSIS — I4821 Permanent atrial fibrillation: Secondary | ICD-10-CM | POA: Diagnosis not present

## 2021-12-10 DIAGNOSIS — I4891 Unspecified atrial fibrillation: Secondary | ICD-10-CM | POA: Diagnosis not present

## 2021-12-28 DIAGNOSIS — H04123 Dry eye syndrome of bilateral lacrimal glands: Secondary | ICD-10-CM | POA: Diagnosis not present

## 2021-12-28 DIAGNOSIS — H43813 Vitreous degeneration, bilateral: Secondary | ICD-10-CM | POA: Diagnosis not present

## 2021-12-28 DIAGNOSIS — H26492 Other secondary cataract, left eye: Secondary | ICD-10-CM | POA: Diagnosis not present

## 2021-12-28 DIAGNOSIS — Z961 Presence of intraocular lens: Secondary | ICD-10-CM | POA: Diagnosis not present

## 2021-12-28 DIAGNOSIS — H26493 Other secondary cataract, bilateral: Secondary | ICD-10-CM | POA: Diagnosis not present

## 2022-01-15 DIAGNOSIS — Z95810 Presence of automatic (implantable) cardiac defibrillator: Secondary | ICD-10-CM | POA: Diagnosis not present

## 2022-01-15 DIAGNOSIS — I420 Dilated cardiomyopathy: Secondary | ICD-10-CM | POA: Diagnosis not present

## 2022-01-15 DIAGNOSIS — Z952 Presence of prosthetic heart valve: Secondary | ICD-10-CM | POA: Diagnosis not present

## 2022-01-15 DIAGNOSIS — Z7901 Long term (current) use of anticoagulants: Secondary | ICD-10-CM | POA: Diagnosis not present

## 2022-01-15 DIAGNOSIS — I4819 Other persistent atrial fibrillation: Secondary | ICD-10-CM | POA: Diagnosis not present

## 2022-01-15 DIAGNOSIS — I739 Peripheral vascular disease, unspecified: Secondary | ICD-10-CM | POA: Diagnosis not present

## 2022-01-30 DIAGNOSIS — H26491 Other secondary cataract, right eye: Secondary | ICD-10-CM | POA: Diagnosis not present

## 2022-01-30 DIAGNOSIS — I4819 Other persistent atrial fibrillation: Secondary | ICD-10-CM | POA: Diagnosis not present

## 2022-01-30 DIAGNOSIS — Z952 Presence of prosthetic heart valve: Secondary | ICD-10-CM | POA: Diagnosis not present

## 2022-01-30 DIAGNOSIS — R791 Abnormal coagulation profile: Secondary | ICD-10-CM | POA: Diagnosis not present

## 2022-02-20 ENCOUNTER — Encounter (HOSPITAL_BASED_OUTPATIENT_CLINIC_OR_DEPARTMENT_OTHER): Payer: Self-pay

## 2022-02-20 ENCOUNTER — Emergency Department (HOSPITAL_BASED_OUTPATIENT_CLINIC_OR_DEPARTMENT_OTHER): Payer: Medicare Other | Admitting: Radiology

## 2022-02-20 ENCOUNTER — Emergency Department (HOSPITAL_BASED_OUTPATIENT_CLINIC_OR_DEPARTMENT_OTHER)
Admission: EM | Admit: 2022-02-20 | Discharge: 2022-02-20 | Disposition: A | Discharge status: Home / Self Care | Payer: Medicare Other | Attending: Emergency Medicine | Admitting: Emergency Medicine

## 2022-02-20 DIAGNOSIS — I509 Heart failure, unspecified: Secondary | ICD-10-CM | POA: Insufficient documentation

## 2022-02-20 DIAGNOSIS — Z7901 Long term (current) use of anticoagulants: Secondary | ICD-10-CM | POA: Diagnosis not present

## 2022-02-20 DIAGNOSIS — M25562 Pain in left knee: Secondary | ICD-10-CM | POA: Diagnosis not present

## 2022-02-20 DIAGNOSIS — M25462 Effusion, left knee: Secondary | ICD-10-CM | POA: Insufficient documentation

## 2022-02-20 DIAGNOSIS — D72829 Elevated white blood cell count, unspecified: Secondary | ICD-10-CM | POA: Diagnosis not present

## 2022-02-20 LAB — CBC WITH DIFFERENTIAL/PLATELET
Abs Immature Granulocytes: 0.04 10*3/uL (ref 0.00–0.07)
Basophils Absolute: 0.1 10*3/uL (ref 0.0–0.1)
Basophils Relative: 0 %
Eosinophils Absolute: 0 10*3/uL (ref 0.0–0.5)
Eosinophils Relative: 0 %
HCT: 44.1 % (ref 39.0–52.0)
Hemoglobin: 15.2 g/dL (ref 13.0–17.0)
Immature Granulocytes: 0 %
Lymphocytes Relative: 8 %
Lymphs Abs: 0.9 10*3/uL (ref 0.7–4.0)
MCH: 33.9 pg (ref 26.0–34.0)
MCHC: 34.5 g/dL (ref 30.0–36.0)
MCV: 98.4 fL (ref 80.0–100.0)
Monocytes Absolute: 0.9 10*3/uL (ref 0.1–1.0)
Monocytes Relative: 8 %
Neutro Abs: 10.2 10*3/uL — ABNORMAL HIGH (ref 1.7–7.7)
Neutrophils Relative %: 84 %
Platelets: 157 10*3/uL (ref 150–400)
RBC: 4.48 MIL/uL (ref 4.22–5.81)
RDW: 12.7 % (ref 11.5–15.5)
WBC: 12.1 10*3/uL — ABNORMAL HIGH (ref 4.0–10.5)
nRBC: 0 % (ref 0.0–0.2)

## 2022-02-20 LAB — COMPREHENSIVE METABOLIC PANEL
ALT: 37 U/L (ref 0–44)
AST: 25 U/L (ref 15–41)
Albumin: 4.8 g/dL (ref 3.5–5.0)
Alkaline Phosphatase: 94 U/L (ref 38–126)
Anion gap: 10 (ref 5–15)
BUN: 16 mg/dL (ref 8–23)
CO2: 28 mmol/L (ref 22–32)
Calcium: 10.2 mg/dL (ref 8.9–10.3)
Chloride: 102 mmol/L (ref 98–111)
Creatinine, Ser: 0.8 mg/dL (ref 0.61–1.24)
GFR, Estimated: >60 mL/min (ref >60)
Glucose, Bld: 99 mg/dL (ref 70–99)
Potassium: 4.5 mmol/L (ref 3.5–5.1)
Sodium: 140 mmol/L (ref 135–145)
Total Bilirubin: 1.6 mg/dL — ABNORMAL HIGH (ref 0.3–1.2)
Total Protein: 8.6 g/dL — ABNORMAL HIGH (ref 6.5–8.1)

## 2022-02-20 LAB — SYNOVIAL CELL COUNT + DIFF, W/ CRYSTALS
Crystals, Fluid: NONE SEEN
Eosinophils-Synovial: 0 % (ref 0–1)
Lymphocytes-Synovial Fld: 7 % (ref 0–20)
Monocyte-Macrophage-Synovial Fluid: 81 % (ref 50–90)
Neutrophil, Synovial: 12 % (ref 0–25)
WBC, Synovial: 465 /mm3 — ABNORMAL HIGH (ref 0–200)

## 2022-02-20 LAB — PROTIME-INR
INR: 2.8 — ABNORMAL HIGH (ref 0.8–1.2)
Prothrombin Time: 29.4 seconds — ABNORMAL HIGH (ref 11.4–15.2)

## 2022-02-20 LAB — SEDIMENTATION RATE: Sed Rate: 10 mm/hr (ref 0–16)

## 2022-02-20 MED ORDER — LIDOCAINE-EPINEPHRINE 2 %-1:100000 IJ SOLN: 20.0000 mL | Freq: Once | INTRAMUSCULAR | Status: DC

## 2022-02-20 MED ORDER — FENTANYL CITRATE PF 50 MCG/ML IJ SOSY
50.0000 ug | PREFILLED_SYRINGE | Freq: Once | INTRAMUSCULAR | Status: AC
  Administered 2022-02-20: 50 ug via INTRAVENOUS
  Filled 2022-02-20: qty 1

## 2022-02-20 MED ORDER — LACTATED RINGERS IV BOLUS
500.0000 mL | Freq: Once | INTRAVENOUS | Status: AC
  Administered 2022-02-20: 500 mL via INTRAVENOUS

## 2022-02-20 MED ORDER — LACTATED RINGERS IV BOLUS: 1000.0000 mL | Freq: Once | INTRAVENOUS | Status: DC

## 2022-02-20 MED ORDER — LIDOCAINE-EPINEPHRINE (PF) 2 %-1:200000 IJ SOLN
INTRAMUSCULAR | Status: AC
  Administered 2022-02-20: 20 mL
  Filled 2022-02-20: qty 20

## 2022-02-20 MED ORDER — IBUPROFEN 400 MG PO TABS
600.0000 mg | ORAL_TABLET | Freq: Once | ORAL | Status: AC
  Administered 2022-02-20: 600 mg via ORAL
  Filled 2022-02-20: qty 1

## 2022-02-20 NOTE — ED Provider Notes (Signed)
[facility] EMERGENCY DEPT Provider Note   CSN: [id number] Arrival date & time: 02/20/22  1400     History  Chief Complaint  Patient presents with   Knee Pain    Edward Marquez is a 71 y.o. male.   Knee Pain   71 year old male with a history of atrial fibrillation and history of aortic valve replacement on Coumadin, CHF with ICD in place, peripheral vascular disease status post right SFA stenting and left femoral to popliteal bypass graft who presents to the emergency department with a chief complaint of left knee pain and swelling.  The patient states that he has had no trauma to the knee.  He was riding horseback earlier this weekend but cannot think of any other known insult to the knee.  He does state that he has a remote history of gout.  He drinks beer but does not consume much red meat.  He states that over the last 3 days he has had worsening pain and swelling to the left knee with pain on range of motion.   Denies any fevers or chills.  Home Medications Prior to Admission medications   Medication Sig Start Date End Date Taking? Authorizing Provider  atorvastatin (LIPITOR) 20 MG tablet Take 1 tablet (20 mg total) by mouth daily. 04/03/19   Alycia Rossetti, MD  diltiazem (CARDIZEM CD) 120 MG 24 hr capsule Take 120 mg 2 (two) times daily by mouth.  06/11/16   [provider]  dofetilide (TIKOSYN) 500 MCG capsule Take 500 mcg by mouth 2 (two) times daily.     [provider]  ibuprofen (ADVIL,MOTRIN) 200 MG tablet Take 400 mg every 6 (six) hours as needed by mouth for headache or moderate pain.    [provider]  metoprolol succinate (TOPROL-XL) 100 MG 24 hr tablet Take 100 mg by mouth daily. Take with or immediately following a meal.    [provider]  ramipril (ALTACE) 2.5 MG capsule Take 2.5 mg by mouth daily.    [provider]  warfarin (COUMADIN) 7.5 MG tablet Take 3.75-7.5 mg See admin instructions by mouth. Take  7.5 mg by mouth daily in the evening except Tue and Sat take 3.75 mg daily in the evening    [provider]      Allergies    Ivp dye [iodinated contrast media]    Review of Systems   Review of Systems  Musculoskeletal:  Positive for arthralgias and joint swelling.  All other systems reviewed and are negative.   Physical Exam Updated Vital Signs BP (!) 164/84   Pulse 60   Temp 98.9 F (37.2 C)   Resp 16   Ht _0  (1.778 m)   Wt 77.1 kg   SpO2 98%   BMI 24.39 kg/m  Physical Exam Vitals and nursing note reviewed.  Constitutional:      General: He is not in acute distress.    Appearance: He is well-developed.  HENT:     Head: Normocephalic and atraumatic.  Eyes:     Conjunctiva/sclera: Conjunctivae normal.  Cardiovascular:     Rate and Rhythm: Normal rate and regular rhythm.  Pulmonary:     Effort: Pulmonary effort is normal. No respiratory distress.     Breath sounds: Normal breath sounds.  Abdominal:     Palpations: Abdomen is soft.     Tenderness: There is no abdominal tenderness.  Musculoskeletal:        General: Swelling and tenderness present.  Cervical back: Neck supple.     Comments: Palpable joint effusion with pain with range of motion of the left knee.  Distal 2+ DP pulses.  Minimal erythema to the knee with tenderness to palpation bilaterally.  Skin:    General: Skin is warm and dry.     Capillary Refill: Capillary refill takes less than 2 seconds.  Neurological:     Mental Status: He is alert.  Psychiatric:        Mood and Affect: Mood normal.     ED Results / Procedures / Treatments   Labs (all labs ordered are listed, but only abnormal results are displayed) Labs Reviewed  CBC WITH DIFFERENTIAL/PLATELET - Abnormal; Notable for the following components:      Result Value   WBC 12.1 (*)    Neutro Abs 10.2 (*)    All other components within normal limits  COMPREHENSIVE METABOLIC PANEL - Abnormal; Notable for the following  components:   Total Protein 8.6 (*)    Total Bilirubin 1.6 (*)    All other components within normal limits  PROTIME-INR - Abnormal; Notable for the following components:   Prothrombin Time 29.4 (*)    INR 2.8 (*)    All other components within normal limits  SYNOVIAL CELL COUNT + DIFF, W/ CRYSTALS - Abnormal; Notable for the following components:   Color, Synovial YELLOW (*)    WBC, Synovial 465 (*)    All other components within normal limits  BODY FLUID CULTURE W GRAM STAIN  SEDIMENTATION RATE  GLUCOSE, BODY FLUID OTHER            PROTEIN, BODY FLUID (OTHER)  C-REACTIVE PROTEIN    EKG None  Radiology DG Knee Complete 4 Views Left  Result Date: 02/20/2022 CLINICAL DATA:  Left knee pain and swelling for 1 week. No known injury. EXAM: LEFT KNEE - COMPLETE 4+ VIEW COMPARISON:  None Available. FINDINGS: No evidence of fracture or dislocation. No evidence of arthropathy or other focal bone abnormality. A small knee joint effusion is seen. Extensive peripheral vascular calcification and numerous surgical clips are noted. IMPRESSION: Small knee joint effusion. No osseous abnormality. Electronically Signed   By: Marlaine Hind M.D.   On: 02/20/2022 16:10    Procedures .Joint Aspiration/Arthrocentesis  Date/Time: 02/20/2022 6:13 PM  Performed by: Regan Lemming, MD Authorized by: Regan Lemming, MD   Consent:    Consent obtained:  Verbal   Consent given by:  Patient   Risks discussed:  Bleeding, infection and pain Universal protocol:    Patient identity confirmed:  Verbally with patient and arm band Location:    Location:  Knee   Knee:  L knee Anesthesia:    Anesthesia method:  Local infiltration   Local anesthetic:  Lidocaine 1% WITH epi Procedure details:    Preparation: Patient was prepped and draped in usual sterile fashion     Needle gauge:  18 G   Ultrasound guidance: no     Approach:  Medial   Aspirate amount:  20cc   Aspirate characteristics:  Yellow   Steroid  injected: no     Specimen collected: yes   Post-procedure details:    Dressing:  Adhesive bandage   Procedure completion:  Tolerated     Medications Ordered in ED Medications  lidocaine-EPINEPHrine (XYLOCAINE W/EPI) 2 %-1:100000 (with pres) injection 20 mL (has no administration in time range)  fentaNYL (SUBLIMAZE) injection 50 mcg (50 mcg Intravenous Given 02/20/22 1741)  lactated ringers bolus 500 mL (  Intravenous Stopped 02/20/22 1807)  lidocaine-EPINEPHrine (XYLOCAINE W/EPI) 2 %-1:200000 (PF) injection (20 mLs  Given 02/20/22 1737)  fentaNYL (SUBLIMAZE) injection 50 mcg (50 mcg Intravenous Given 02/20/22 2222)  ibuprofen (ADVIL) tablet 600 mg (600 mg Oral Given 02/20/22 2306)    ED Course/ Medical Decision Making/ A&P Clinical Course as of 02/20/22 2344  Tue Feb 20, 2022  1751 INR(!): 2.8 [JL]  1751 WBC(!): 12.1 [JL]    Clinical Course User Index [JL] Regan Lemming, MD                           Medical Decision Making Amount and/or Complexity of Data Reviewed Labs: ordered. Decision-making details documented in ED Course. Radiology: ordered.  Risk Prescription drug management.    70 year old male with a history of atrial fibrillation and history of aortic valve replacement on Coumadin, CHF with ICD in place, peripheral vascular disease status post right SFA stenting and left femoral to popliteal bypass graft who presents to the emergency department with a chief complaint of left knee pain and swelling.  The patient states that he has had no trauma to the knee.  He was riding horseback earlier this weekend but cannot think of any other known insult to the knee.  He does state that he has a remote history of gout.  He drinks beer but does not consume much red meat.  He states that over the last 3 days he has had worsening pain and swelling to the left knee with pain on range of motion.   Denies any fevers or chills.  On arrival, the patient was vitally stable, afebrile,  hypertensive BP 167/105.  Physical exam significant for a palpable joint effusion on the left with pain with range of motion of the left knee with distal pulses intact, pain on palpation bilaterally, minimal erythema but warmth noted.  Differential diagnosis includes osteoarthritis, gout flare, septic arthritis.  Less likely trauma.  Will obtain screening laboratory evaluation, check the patient's INR, x-ray imaging.  Discussed the risks and benefits of arthrocentesis to evaluate for gout versus septic arthritis.  I believe that the benefits of determining the cause of the patient's knee swelling outweigh the risk of potential bleeding into the joint given the concern for possible septic arthritis.  Analysis of the synovial fluid revealed yellow fluid, 465 WBCs, no crystals seen.  Low concern for gout or septic arthritis based on this evaluation.  X-ray imaging was performed which was negative for acute fracture or malalignment.  ESR was normal.  CRP hemolyzed but given the findings on arthrocentesis, low concern for need for repeat of the lab.  CMP without significant electrolyte abnormality, CBC with a nonspecific leukocytosis to 12.1.  The patient was advised NSAIDs for pain control and a referral was placed for follow-up outpatient with orthopedics.  He was advised to follow-up outpatient with orthopedics to discuss further management of his joint effusion.  Final Clinical Impression(s) / ED Diagnoses Final diagnoses:  Effusion of left knee    Rx / DC Orders ED Discharge Orders          Ordered    AMB referral to orthopedics        02/20/22 2323              Regan Lemming, MD 02/20/22 2344

## 2022-02-20 NOTE — ED Notes (Signed)
Pt agreeable with d/c plan as discussed by provider- this nurse has verbally reinforced d/c instructions and provided pt with written copy -pt acknowledges verbal understanding and denies any additional questions, concerns, needs- pt ambulatory independently at d/c with slight limp; no distress otherwise

## 2022-02-20 NOTE — Discharge Instructions (Addendum)
Please follow-up outpatient with orthopedic surgery.  The results of your joint arthrocentesis revealed no evidence of gout or septic arthritis.  Recommend NSAIDs for the next few days, '600mg'$  of Ibuprofen every 8 hrs.

## 2022-02-20 NOTE — ED Triage Notes (Signed)
Pt c/o pain and swelling to left knee over the past couple of days. Pt not sure if he could have injured it a few days ago when horseback riding. Pt denies cp or sob. Pt reports he takes coumadin for his afib, states he hasn't missed any doses.

## 2022-02-20 NOTE — ED Notes (Signed)
CRP (gold tube) needs to be recollect per [facility].

## 2022-02-22 DIAGNOSIS — M25562 Pain in left knee: Secondary | ICD-10-CM | POA: Diagnosis not present

## 2022-02-22 LAB — PROTEIN, BODY FLUID (OTHER): Total Protein, Body Fluid Other: 1.8 g/dL

## 2022-02-22 LAB — GLUCOSE, BODY FLUID OTHER: Glucose, Body Fluid Other: 105 mg/dL

## 2022-02-23 LAB — BODY FLUID CULTURE W GRAM STAIN
Culture: NO GROWTH
Gram Stain: NONE SEEN

## 2022-03-05 DIAGNOSIS — I4891 Unspecified atrial fibrillation: Secondary | ICD-10-CM | POA: Diagnosis not present

## 2022-03-05 DIAGNOSIS — Z4502 Encounter for adjustment and management of automatic implantable cardiac defibrillator: Secondary | ICD-10-CM | POA: Diagnosis not present

## 2022-03-14 ENCOUNTER — Ambulatory Visit (INDEPENDENT_AMBULATORY_CARE_PROVIDER_SITE_OTHER): Payer: Medicare Other | Admitting: Family Medicine

## 2022-03-14 ENCOUNTER — Encounter: Payer: Self-pay | Admitting: Family Medicine

## 2022-03-14 VITALS — BP 127/71 | HR 94 | Ht 69.5 in | Wt 165.0 lb

## 2022-03-14 DIAGNOSIS — Z1211 Encounter for screening for malignant neoplasm of colon: Secondary | ICD-10-CM

## 2022-03-14 DIAGNOSIS — I482 Chronic atrial fibrillation, unspecified: Secondary | ICD-10-CM | POA: Diagnosis not present

## 2022-03-14 DIAGNOSIS — I739 Peripheral vascular disease, unspecified: Secondary | ICD-10-CM

## 2022-03-14 DIAGNOSIS — E559 Vitamin D deficiency, unspecified: Secondary | ICD-10-CM

## 2022-03-14 DIAGNOSIS — M25462 Effusion, left knee: Secondary | ICD-10-CM | POA: Diagnosis not present

## 2022-03-14 DIAGNOSIS — R7301 Impaired fasting glucose: Secondary | ICD-10-CM | POA: Diagnosis not present

## 2022-03-14 NOTE — Assessment & Plan Note (Signed)
follow-up with cardiology at [facility] Notes to be taking warfarin, ramipril, and metoprolol prescribed by his cardiologist  No complaints or concerns today

## 2022-03-14 NOTE — Patient Instructions (Signed)
I appreciate the opportunity to provide care to you today!    Follow up:  4 months  Labs: please stop by the lab today to get your blood drawn (CBC, BMP, TSH, Lipid profile, HgA1c, Vit D)       Please continue to a heart-healthy diet and increase your physical activities. Try to exercise for 90mns at least three times a week.      It was a pleasure to see you and I look forward to continuing to work together on your health and well-being. Please do not hesitate to call the office if you need care or have questions about your care.   Have a wonderful day and week. With Gratitude, GAlvira MondayMSN, FNP-BC

## 2022-03-14 NOTE — Progress Notes (Signed)
New

## 2022-03-14 NOTE — Assessment & Plan Note (Signed)
Resolved Reports being treated 3 weeks ago with Keflex, and symptoms have subsided

## 2022-03-14 NOTE — Progress Notes (Signed)
New Patient Office Visit  Subjective:  Patient ID: Edward Marquez, male    DOB: 12/02/1950  Age: 71 y.o. MRN: [id number]  CC:  Chief Complaint  Patient presents with   New Patient (Initial Visit)    Establishing care, transferring from [facility], previously seen by Dr. Buelah Manis.     HPI Edward Marquez is a 71 y.o. male with past medical history of  atrial fibrillation and history of aortic valve replacement on Coumadin, CHF with ICD in place, peripheral vascular disease status post right SFA stenting and left femoral to popliteal bypass graft  presents for establishing care. Effusion of the left knee: resolved. Reports being treated 3 weeks ago with Keflex, and symptoms have subsided.  A-Fib: follow-up with cardiology at [place]. Notes to be taking warfarin, ramipril, and metoprolol prescribed by his cardiologist. No complaints or concerns today.  Past Medical History:  Diagnosis Date   Aortic valve defect    Atrial fibrillation (HCC)    CHF (congestive heart failure) (HCC)    Clotting disorder ([place])    Gout    Hypertension    Peripheral vascular disease ([place])     Past Surgical History:  Procedure Laterality Date   ABDOMINAL AORTOGRAM W/LOWER EXTREMITY N/A 05/03/2017   Procedure: ABDOMINAL AORTOGRAM W/LOWER EXTREMITY;  Surgeon: Angelia Mould, MD;  Location: [place] [facility];  Service: Cardiovascular;  Laterality: N/A;   AORTIC VALVE REPLACEMENT     BYPASS GRAFT POPLITEAL TO TIBIAL Left 05/03/2017   Procedure: LEFT POPLITEAL TO ANTERIOR TIBIAL ARTERY BYPASS WITH VEIN;  Surgeon: Angelia Mould, MD;  Location: [place];  Service: Vascular;  Laterality: Left;   LOWER EXTREMITY ANGIOGRAPHY N/A 06/24/2017   Procedure: LOWER EXTREMITY ANGIOGRAPHY - Right;  Surgeon: Angelia Mould, MD;  Location: [place] [facility];  Service: Cardiovascular;  Laterality: N/A;   PACEMAKER INSERTION     PERIPHERAL VASCULAR BALLOON ANGIOPLASTY  05/03/2017    Procedure: PERIPHERAL VASCULAR BALLOON ANGIOPLASTY;  Surgeon: Angelia Mould, MD;  Location: [place] [facility];  Service: Cardiovascular;;   PERIPHERAL VASCULAR BALLOON ANGIOPLASTY Right 06/24/2017   Procedure: PERIPHERAL VASCULAR BALLOON ANGIOPLASTY;  Surgeon: Angelia Mould, MD;  Location: [place] [facility];  Service: Cardiovascular;  Laterality: Right;  ant-tibial / perneal   VEIN HARVEST Left 05/03/2017   Procedure: POPITEAL VEIN HARVEST;  Surgeon: Angelia Mould, MD;  Location: [facility];  Service: Vascular;  Laterality: Left;    Family History  Problem Relation Age of Onset   Heart disease Mother 24       bypass surgery   Arthritis Mother    Cancer Mother        breast/ colon   Hyperlipidemia Mother    Hypertension Mother     Social History   Socioeconomic History   Marital status: Married    Spouse name: Not on file   Number of children: Not on file   Years of education: Not on file   Highest education level: Not on file  Occupational History   Occupation: Retired  Tobacco Use   Smoking status: Never   Smokeless tobacco: Never  Vaping Use   Vaping Use: Never used  Substance and Sexual Activity   Alcohol use: Yes    Alcohol/week: 5.0 standard drinks of alcohol    Types: 5 Glasses of wine per week   Drug use: No   Sexual activity: Yes  Other Topics Concern   Not on file  Social History  Narrative   Not on file   Social Determinants of Health   Financial Resource Strain: Low Risk  (06/23/2017)   Overall Financial Resource Strain (CARDIA)    Difficulty of Paying Living Expenses: Not hard at all  Food Insecurity: No Food Insecurity (06/23/2017)   Hunger Vital Sign    Worried About Running Out of Food in the Last Year: Never true    Ran Out of Food in the Last Year: Never true  Transportation Needs: No Transportation Needs (06/23/2017)   PRAPARE - Hydrologist (Medical): No    Lack of Transportation  (Non-Medical): No  Physical Activity: Unknown (06/23/2017)   Exercise Vital Sign    Days of Exercise per Week: Patient refused    Minutes of Exercise per Session: Patient refused  Stress: No Stress Concern Present (06/23/2017)   [place]    Feeling of Stress : Only a little  Social Connections: Unknown (06/23/2017)   Social Connection and Isolation Panel [NHANES]    Frequency of Communication with Friends and Family: More than three times a week    Frequency of Social Gatherings with Friends and Family: Three times a week    Attends Religious Services: Patient refused    Active Member of Clubs or Organizations: Patient refused    Attends Archivist Meetings: Patient refused    Marital Status: Living with partner  Intimate Partner Violence: Not At Risk (06/23/2017)   Humiliation, Afraid, Rape, and Kick questionnaire    Fear of Current or Ex-Partner: No    Emotionally Abused: No    Physically Abused: No    Sexually Abused: No    ROS Review of Systems  Constitutional:  Negative for chills, fatigue and fever.  HENT:  Negative for sinus pressure, sinus pain and sore throat.   Eyes:  Negative for photophobia, pain and redness.  Respiratory:  Negative for chest tightness and shortness of breath.   Cardiovascular:  Negative for chest pain and palpitations.  Gastrointestinal:  Negative for diarrhea, nausea and vomiting.  Endocrine: Negative for polydipsia, polyphagia and polyuria.  Genitourinary:  Negative for difficulty urinating, frequency and urgency.  Musculoskeletal:  Negative for gait problem, joint swelling and neck pain.  Skin:  Negative for rash and wound.  Neurological:  Negative for dizziness, numbness and headaches.  Psychiatric/Behavioral:  Negative for self-injury and suicidal ideas.     Objective:   Today's Vitals: BP 127/71   Pulse 94   Ht 5' 9.5" (1.765 m)   Wt 165 lb 0.6 oz (74.9 kg)    SpO2 98%   BMI 24.02 kg/m   Physical Exam HENT:     Head: Normocephalic.     Right Ear: External ear normal.     Left Ear: External ear normal.     Nose: No congestion.  Eyes:     Extraocular Movements: Extraocular movements intact.     Pupils: Pupils are equal, round, and reactive to light.  Cardiovascular:     Rate and Rhythm: Normal rate and regular rhythm.     Pulses: Normal pulses.     Heart sounds: Normal heart sounds.  Pulmonary:     Effort: Pulmonary effort is normal.     Breath sounds: Normal breath sounds.  Abdominal:     Palpations: Abdomen is soft.  Musculoskeletal:        General: No deformity or signs of injury.     Cervical back: No rigidity.  Skin:    General: Skin is warm.     Findings: No bruising or erythema.  Neurological:     Mental Status: He is alert and oriented to person, place, and time.  Psychiatric:     Comments: Normal affect     Assessment & Plan:   Problem List Items Addressed This Visit       Cardiovascular and Mediastinum   PAD (peripheral artery disease) ([place])   Relevant Orders   Lipid Profile   TSH + free T4   CBC   BASIC METABOLIC PANEL WITH GFR   Atrial fibrillation ([place])    follow-up with cardiology at [facility] Notes to be taking warfarin, ramipril, and metoprolol prescribed by his cardiologist  No complaints or concerns today        Musculoskeletal and Integument   Effusion, left knee    Resolved Reports being treated 3 weeks ago with Keflex, and symptoms have subsided       Other Visit Diagnoses     Colon cancer screening    -  Primary   Relevant Orders   Cologuard   IFG (impaired fasting glucose)       Relevant Orders   Hemoglobin A1C   Vitamin D deficiency       Relevant Orders   Vitamin D (25 hydroxy)       Outpatient Encounter Medications as of 03/14/2022  Medication Sig   ibuprofen (ADVIL,MOTRIN) 200 MG tablet Take 400 mg every 6 (six) hours as needed by mouth for headache or moderate  pain.   metoprolol succinate (TOPROL-XL) 100 MG 24 hr tablet Take 100 mg by mouth daily. Take with or immediately following a meal.   ramipril (ALTACE) 2.5 MG capsule Take 2.5 mg by mouth daily.   warfarin (COUMADIN) 7.5 MG tablet Take 3.75-7.5 mg See admin instructions by mouth. Take 7.5 mg by mouth daily in the evening except Tue and Sat take 3.75 mg daily in the evening   atorvastatin (LIPITOR) 20 MG tablet Take 1 tablet (20 mg total) by mouth daily. (Patient not taking: Reported on 03/14/2022)   diltiazem (CARDIZEM CD) 120 MG 24 hr capsule Take 120 mg 2 (two) times daily by mouth.  (Patient not taking: Reported on 03/14/2022)   dofetilide (TIKOSYN) 500 MCG capsule Take 500 mcg by mouth 2 (two) times daily.  (Patient not taking: Reported on 03/14/2022)   No facility-administered encounter medications on file as of 03/14/2022.    Follow-up: Return in about 4 months (around 07/14/2022) for CPE.   Alvira Monday, FNP

## 2022-03-15 DIAGNOSIS — I739 Peripheral vascular disease, unspecified: Secondary | ICD-10-CM | POA: Diagnosis not present

## 2022-03-15 DIAGNOSIS — E559 Vitamin D deficiency, unspecified: Secondary | ICD-10-CM | POA: Diagnosis not present

## 2022-03-15 DIAGNOSIS — R7301 Impaired fasting glucose: Secondary | ICD-10-CM | POA: Diagnosis not present

## 2022-03-15 DIAGNOSIS — E7800 Pure hypercholesterolemia, unspecified: Secondary | ICD-10-CM | POA: Diagnosis not present

## 2022-03-16 LAB — HEMOGLOBIN A1C
Est. average glucose Bld gHb Est-mCnc: 131 mg/dL
Hgb A1c MFr Bld: 6.2 % — ABNORMAL HIGH (ref 4.8–5.6)

## 2022-03-16 LAB — CBC
Hematocrit: 42.8 % (ref 37.5–51.0)
Hemoglobin: 14.6 g/dL (ref 13.0–17.7)
MCH: 32 pg (ref 26.6–33.0)
MCHC: 34.1 g/dL (ref 31.5–35.7)
MCV: 94 fL (ref 79–97)
Platelets: 192 10*3/uL (ref 150–450)
RBC: 4.56 x10E6/uL (ref 4.14–5.80)
RDW: 12.4 % (ref 11.6–15.4)
WBC: 6.2 10*3/uL (ref 3.4–10.8)

## 2022-03-16 LAB — LIPID PANEL
Chol/HDL Ratio: 3.9 ratio (ref 0.0–5.0)
Cholesterol, Total: 201 mg/dL — ABNORMAL HIGH (ref 100–199)
HDL: 52 mg/dL (ref >39)
LDL Chol Calc (NIH): 132 mg/dL — ABNORMAL HIGH (ref 0–99)
Triglycerides: 95 mg/dL (ref 0–149)
VLDL Cholesterol Cal: 17 mg/dL (ref 5–40)

## 2022-03-16 LAB — TSH+FREE T4
Free T4: 1.26 ng/dL (ref 0.82–1.77)
TSH: 1.14 u[IU]/mL (ref 0.450–4.500)

## 2022-03-16 LAB — VITAMIN D 25 HYDROXY (VIT D DEFICIENCY, FRACTURES): Vit D, 25-Hydroxy: 36.5 ng/mL (ref 30.0–100.0)

## 2022-03-21 DIAGNOSIS — Z7901 Long term (current) use of anticoagulants: Secondary | ICD-10-CM | POA: Diagnosis not present

## 2022-03-21 DIAGNOSIS — Z952 Presence of prosthetic heart valve: Secondary | ICD-10-CM | POA: Diagnosis not present

## 2022-03-21 DIAGNOSIS — I4819 Other persistent atrial fibrillation: Secondary | ICD-10-CM | POA: Diagnosis not present

## 2022-03-21 DIAGNOSIS — Z5181 Encounter for therapeutic drug level monitoring: Secondary | ICD-10-CM | POA: Diagnosis not present

## 2022-03-23 ENCOUNTER — Other Ambulatory Visit: Payer: Self-pay | Admitting: Family Medicine

## 2022-03-23 DIAGNOSIS — E785 Hyperlipidemia, unspecified: Secondary | ICD-10-CM

## 2022-03-23 MED ORDER — ATORVASTATIN CALCIUM 20 MG PO TABS: 20.0000 mg | ORAL_TABLET | Freq: Every day | ORAL | 0 refills | Status: DC

## 2022-03-23 NOTE — Progress Notes (Unsigned)
The 10-year ASCVD risk score (Arnett DK, et al., 2019) is: 20.3%   Values used to calculate the score:     Age: 71 years     Sex: Male     Is Non-Hispanic African American: No     Diabetic: No     Tobacco smoker: No     Systolic Blood Pressure: 356 mmHg     Is BP treated: Yes     HDL Cholesterol: 52 mg/dL     Total Cholesterol: 201 mg/dL

## 2022-03-23 NOTE — Progress Notes (Signed)
Please inform the patient that he has prediabetes and elevated cholesterol levels. I've sent a refill of his Lipitor to his pharmacy. i recommend low carbs and fat diet.

## 2022-04-02 DIAGNOSIS — I340 Nonrheumatic mitral (valve) insufficiency: Secondary | ICD-10-CM | POA: Diagnosis not present

## 2022-04-09 ENCOUNTER — Ambulatory Visit: Payer: Medicare Other

## 2022-04-30 DIAGNOSIS — Z7901 Long term (current) use of anticoagulants: Secondary | ICD-10-CM | POA: Diagnosis not present

## 2022-04-30 DIAGNOSIS — R791 Abnormal coagulation profile: Secondary | ICD-10-CM | POA: Diagnosis not present

## 2022-04-30 DIAGNOSIS — Z95810 Presence of automatic (implantable) cardiac defibrillator: Secondary | ICD-10-CM | POA: Diagnosis not present

## 2022-04-30 DIAGNOSIS — I739 Peripheral vascular disease, unspecified: Secondary | ICD-10-CM | POA: Diagnosis not present

## 2022-04-30 DIAGNOSIS — I420 Dilated cardiomyopathy: Secondary | ICD-10-CM | POA: Diagnosis not present

## 2022-04-30 DIAGNOSIS — Z952 Presence of prosthetic heart valve: Secondary | ICD-10-CM | POA: Diagnosis not present

## 2022-04-30 DIAGNOSIS — I4819 Other persistent atrial fibrillation: Secondary | ICD-10-CM | POA: Diagnosis not present

## 2022-04-30 DIAGNOSIS — I7121 Aneurysm of the ascending aorta, without rupture: Secondary | ICD-10-CM | POA: Diagnosis not present

## 2022-05-02 DIAGNOSIS — I4819 Other persistent atrial fibrillation: Secondary | ICD-10-CM | POA: Diagnosis not present

## 2022-05-02 DIAGNOSIS — Z7901 Long term (current) use of anticoagulants: Secondary | ICD-10-CM | POA: Diagnosis not present

## 2022-05-02 DIAGNOSIS — Z5181 Encounter for therapeutic drug level monitoring: Secondary | ICD-10-CM | POA: Diagnosis not present

## 2022-05-02 DIAGNOSIS — Z952 Presence of prosthetic heart valve: Secondary | ICD-10-CM | POA: Diagnosis not present

## 2022-05-07 DIAGNOSIS — I700 Atherosclerosis of aorta: Secondary | ICD-10-CM | POA: Diagnosis not present

## 2022-05-07 DIAGNOSIS — I7121 Aneurysm of the ascending aorta, without rupture: Secondary | ICD-10-CM | POA: Diagnosis not present

## 2022-05-07 DIAGNOSIS — R911 Solitary pulmonary nodule: Secondary | ICD-10-CM | POA: Diagnosis not present

## 2022-05-07 DIAGNOSIS — I2510 Atherosclerotic heart disease of native coronary artery without angina pectoris: Secondary | ICD-10-CM | POA: Diagnosis not present

## 2022-05-07 DIAGNOSIS — I517 Cardiomegaly: Secondary | ICD-10-CM | POA: Diagnosis not present

## 2022-05-10 DIAGNOSIS — Z7901 Long term (current) use of anticoagulants: Secondary | ICD-10-CM | POA: Diagnosis not present

## 2022-05-10 DIAGNOSIS — Z952 Presence of prosthetic heart valve: Secondary | ICD-10-CM | POA: Diagnosis not present

## 2022-05-10 DIAGNOSIS — R791 Abnormal coagulation profile: Secondary | ICD-10-CM | POA: Diagnosis not present

## 2022-05-10 DIAGNOSIS — I4819 Other persistent atrial fibrillation: Secondary | ICD-10-CM | POA: Diagnosis not present

## 2022-05-24 DIAGNOSIS — Z5181 Encounter for therapeutic drug level monitoring: Secondary | ICD-10-CM | POA: Diagnosis not present

## 2022-05-24 DIAGNOSIS — C44319 Basal cell carcinoma of skin of other parts of face: Secondary | ICD-10-CM | POA: Diagnosis not present

## 2022-05-24 DIAGNOSIS — Z95810 Presence of automatic (implantable) cardiac defibrillator: Secondary | ICD-10-CM | POA: Diagnosis not present

## 2022-05-24 DIAGNOSIS — Z952 Presence of prosthetic heart valve: Secondary | ICD-10-CM | POA: Diagnosis not present

## 2022-05-24 DIAGNOSIS — I4891 Unspecified atrial fibrillation: Secondary | ICD-10-CM | POA: Diagnosis not present

## 2022-05-24 DIAGNOSIS — Z7901 Long term (current) use of anticoagulants: Secondary | ICD-10-CM | POA: Diagnosis not present

## 2022-05-24 DIAGNOSIS — I4819 Other persistent atrial fibrillation: Secondary | ICD-10-CM | POA: Diagnosis not present

## 2022-05-24 DIAGNOSIS — C4441 Basal cell carcinoma of skin of scalp and neck: Secondary | ICD-10-CM | POA: Diagnosis not present

## 2022-05-24 DIAGNOSIS — I429 Cardiomyopathy, unspecified: Secondary | ICD-10-CM | POA: Diagnosis not present

## 2022-05-24 DIAGNOSIS — I7121 Aneurysm of the ascending aorta, without rupture: Secondary | ICD-10-CM | POA: Diagnosis not present

## 2022-05-24 DIAGNOSIS — I739 Peripheral vascular disease, unspecified: Secondary | ICD-10-CM | POA: Diagnosis not present

## 2022-05-31 DIAGNOSIS — Z95810 Presence of automatic (implantable) cardiac defibrillator: Secondary | ICD-10-CM | POA: Diagnosis not present

## 2022-05-31 DIAGNOSIS — I4819 Other persistent atrial fibrillation: Secondary | ICD-10-CM | POA: Diagnosis not present

## 2022-05-31 DIAGNOSIS — Z7901 Long term (current) use of anticoagulants: Secondary | ICD-10-CM | POA: Diagnosis not present

## 2022-05-31 DIAGNOSIS — R791 Abnormal coagulation profile: Secondary | ICD-10-CM | POA: Diagnosis not present

## 2022-05-31 DIAGNOSIS — Z5181 Encounter for therapeutic drug level monitoring: Secondary | ICD-10-CM | POA: Diagnosis not present

## 2022-05-31 DIAGNOSIS — Z952 Presence of prosthetic heart valve: Secondary | ICD-10-CM | POA: Diagnosis not present

## 2022-06-04 DIAGNOSIS — Z952 Presence of prosthetic heart valve: Secondary | ICD-10-CM | POA: Diagnosis not present

## 2022-06-04 DIAGNOSIS — I4819 Other persistent atrial fibrillation: Secondary | ICD-10-CM | POA: Diagnosis not present

## 2022-06-04 DIAGNOSIS — Z95810 Presence of automatic (implantable) cardiac defibrillator: Secondary | ICD-10-CM | POA: Diagnosis not present

## 2022-06-04 DIAGNOSIS — Z7901 Long term (current) use of anticoagulants: Secondary | ICD-10-CM | POA: Diagnosis not present

## 2022-06-12 DIAGNOSIS — R791 Abnormal coagulation profile: Secondary | ICD-10-CM | POA: Diagnosis not present

## 2022-06-12 DIAGNOSIS — Z5181 Encounter for therapeutic drug level monitoring: Secondary | ICD-10-CM | POA: Diagnosis not present

## 2022-06-12 DIAGNOSIS — Z7901 Long term (current) use of anticoagulants: Secondary | ICD-10-CM | POA: Diagnosis not present

## 2022-06-12 DIAGNOSIS — Z952 Presence of prosthetic heart valve: Secondary | ICD-10-CM | POA: Diagnosis not present

## 2022-06-12 DIAGNOSIS — I4819 Other persistent atrial fibrillation: Secondary | ICD-10-CM | POA: Diagnosis not present

## 2022-06-14 DIAGNOSIS — I4891 Unspecified atrial fibrillation: Secondary | ICD-10-CM | POA: Diagnosis not present

## 2022-06-14 DIAGNOSIS — I4819 Other persistent atrial fibrillation: Secondary | ICD-10-CM | POA: Diagnosis not present

## 2022-06-14 DIAGNOSIS — I499 Cardiac arrhythmia, unspecified: Secondary | ICD-10-CM | POA: Diagnosis not present

## 2022-06-25 DIAGNOSIS — C44319 Basal cell carcinoma of skin of other parts of face: Secondary | ICD-10-CM | POA: Diagnosis not present

## 2022-06-25 DIAGNOSIS — C4441 Basal cell carcinoma of skin of scalp and neck: Secondary | ICD-10-CM | POA: Diagnosis not present

## 2022-06-26 DIAGNOSIS — Z952 Presence of prosthetic heart valve: Secondary | ICD-10-CM | POA: Diagnosis not present

## 2022-06-26 DIAGNOSIS — I4819 Other persistent atrial fibrillation: Secondary | ICD-10-CM | POA: Diagnosis not present

## 2022-06-26 DIAGNOSIS — Z7901 Long term (current) use of anticoagulants: Secondary | ICD-10-CM | POA: Diagnosis not present

## 2022-07-02 DIAGNOSIS — Z7901 Long term (current) use of anticoagulants: Secondary | ICD-10-CM | POA: Diagnosis not present

## 2022-07-02 DIAGNOSIS — I498 Other specified cardiac arrhythmias: Secondary | ICD-10-CM | POA: Diagnosis not present

## 2022-07-02 DIAGNOSIS — Z95810 Presence of automatic (implantable) cardiac defibrillator: Secondary | ICD-10-CM | POA: Diagnosis not present

## 2022-07-02 DIAGNOSIS — I4819 Other persistent atrial fibrillation: Secondary | ICD-10-CM | POA: Diagnosis not present

## 2022-07-02 DIAGNOSIS — Z952 Presence of prosthetic heart valve: Secondary | ICD-10-CM | POA: Diagnosis not present

## 2022-07-09 DIAGNOSIS — Z1211 Encounter for screening for malignant neoplasm of colon: Secondary | ICD-10-CM | POA: Diagnosis not present

## 2022-07-16 ENCOUNTER — Ambulatory Visit: Payer: Medicare Other | Admitting: Family Medicine

## 2022-07-17 DIAGNOSIS — Z7901 Long term (current) use of anticoagulants: Secondary | ICD-10-CM | POA: Diagnosis not present

## 2022-07-17 DIAGNOSIS — Z5181 Encounter for therapeutic drug level monitoring: Secondary | ICD-10-CM | POA: Diagnosis not present

## 2022-07-17 DIAGNOSIS — Z952 Presence of prosthetic heart valve: Secondary | ICD-10-CM | POA: Diagnosis not present

## 2022-07-17 DIAGNOSIS — I4819 Other persistent atrial fibrillation: Secondary | ICD-10-CM | POA: Diagnosis not present

## 2022-07-18 LAB — COLOGUARD: COLOGUARD: POSITIVE — AB

## 2022-07-19 ENCOUNTER — Other Ambulatory Visit: Payer: Self-pay | Admitting: Family Medicine

## 2022-07-19 ENCOUNTER — Encounter: Payer: Self-pay | Admitting: Gastroenterology

## 2022-07-19 DIAGNOSIS — R195 Other fecal abnormalities: Secondary | ICD-10-CM

## 2022-07-19 NOTE — Progress Notes (Signed)
Please inform the patient that her cologurad is positive. A positive result may indicate the presence of colorectal cancer or advanced adenoma and should be followed by colonoscopy.  A referral to GI is placed

## 2022-07-27 ENCOUNTER — Other Ambulatory Visit: Payer: Self-pay | Admitting: Family Medicine

## 2022-07-27 DIAGNOSIS — E785 Hyperlipidemia, unspecified: Secondary | ICD-10-CM

## 2022-07-31 ENCOUNTER — Encounter: Payer: Self-pay | Admitting: Family Medicine

## 2022-07-31 ENCOUNTER — Ambulatory Visit (INDEPENDENT_AMBULATORY_CARE_PROVIDER_SITE_OTHER): Payer: Medicare Other | Admitting: Family Medicine

## 2022-07-31 VITALS — BP 126/82 | HR 85 | Ht 69.5 in | Wt 173.0 lb

## 2022-07-31 DIAGNOSIS — M2041 Other hammer toe(s) (acquired), right foot: Secondary | ICD-10-CM

## 2022-07-31 DIAGNOSIS — E559 Vitamin D deficiency, unspecified: Secondary | ICD-10-CM | POA: Diagnosis not present

## 2022-07-31 DIAGNOSIS — E785 Hyperlipidemia, unspecified: Secondary | ICD-10-CM

## 2022-07-31 DIAGNOSIS — E038 Other specified hypothyroidism: Secondary | ICD-10-CM | POA: Diagnosis not present

## 2022-07-31 DIAGNOSIS — I480 Paroxysmal atrial fibrillation: Secondary | ICD-10-CM

## 2022-07-31 DIAGNOSIS — M2042 Other hammer toe(s) (acquired), left foot: Secondary | ICD-10-CM

## 2022-07-31 DIAGNOSIS — R7301 Impaired fasting glucose: Secondary | ICD-10-CM | POA: Diagnosis not present

## 2022-07-31 NOTE — Progress Notes (Signed)
Established Patient Office Visit  Subjective:  Patient ID: Edward Marquez, male    DOB: 1951-06-15  Age: 71 y.o. MRN: [id number]  CC:  Chief Complaint  Patient presents with   Follow-up    4 month f/u, pt reports doing well, has good energy.    Foot Problem    Pt reports feet problem, needs a referral. States he has 2 hammer toes.     HPI Edward Marquez is a 71 y.o. male with past medical history of peripheral vascular disease, peripheral vascular disease, atrial fibrillation presents for f/u of  chronic medical conditions. For the details of today's visit, please refer to the assessment and plan.       Past Medical History:  Diagnosis Date   Aortic valve defect    Atrial fibrillation (HCC)    CHF (congestive heart failure) (HCC)    Clotting disorder ([place])    Gout    Hypertension    Peripheral vascular disease ([place])     Past Surgical History:  Procedure Laterality Date   ABDOMINAL AORTOGRAM W/LOWER EXTREMITY N/A 05/03/2017   Procedure: ABDOMINAL AORTOGRAM W/LOWER EXTREMITY;  Surgeon: Angelia Mould, MD;  Location: [place] [facility];  Service: Cardiovascular;  Laterality: N/A;   AORTIC VALVE REPLACEMENT     BYPASS GRAFT POPLITEAL TO TIBIAL Left 05/03/2017   Procedure: LEFT POPLITEAL TO ANTERIOR TIBIAL ARTERY BYPASS WITH VEIN;  Surgeon: Angelia Mould, MD;  Location: [place];  Service: Vascular;  Laterality: Left;   LOWER EXTREMITY ANGIOGRAPHY N/A 06/24/2017   Procedure: LOWER EXTREMITY ANGIOGRAPHY - Right;  Surgeon: Angelia Mould, MD;  Location: [place] [facility];  Service: Cardiovascular;  Laterality: N/A;   PACEMAKER INSERTION     PERIPHERAL VASCULAR BALLOON ANGIOPLASTY  05/03/2017   Procedure: PERIPHERAL VASCULAR BALLOON ANGIOPLASTY;  Surgeon: Angelia Mould, MD;  Location: [facility] [facility];  Service: Cardiovascular;;   PERIPHERAL VASCULAR BALLOON ANGIOPLASTY Right 06/24/2017   Procedure: PERIPHERAL VASCULAR BALLOON ANGIOPLASTY;  Surgeon:  Angelia Mould, MD;  Location: [place] [facility];  Service: Cardiovascular;  Laterality: Right;  ant-tibial / perneal   VEIN HARVEST Left 05/03/2017   Procedure: POPITEAL VEIN HARVEST;  Surgeon: Angelia Mould, MD;  Location: [facility] OR;  Service: Vascular;  Laterality: Left;    Family History  Problem Relation Age of Onset   Heart disease Mother 34       bypass surgery   Arthritis Mother    Cancer Mother        breast/ colon   Hyperlipidemia Mother    Hypertension Mother     Social History   Socioeconomic History   Marital status: Married    Spouse name: Not on file   Number of children: Not on file   Years of education: Not on file   Highest education level: Not on file  Occupational History   Occupation: Retired  Tobacco Use   Smoking status: Never   Smokeless tobacco: Never  Vaping Use   Vaping Use: Never used  Substance and Sexual Activity   Alcohol use: Yes    Alcohol/week: 5.0 standard drinks of alcohol    Types: 5 Glasses of wine per week   Drug use: No   Sexual activity: Yes  Other Topics Concern   Not on file  Social History Narrative   Not on file   Social Determinants of Health   Financial Resource Strain: Low Risk  (06/23/2017)   Overall Financial Resource Strain (CARDIA)  Difficulty of Paying Living Expenses: Not hard at all  Food Insecurity: No Food Insecurity (06/23/2017)   Hunger Vital Sign    Worried About Running Out of Food in the Last Year: Never true    Ran Out of Food in the Last Year: Never true  Transportation Needs: No Transportation Needs (06/23/2017)   PRAPARE - Hydrologist (Medical): No    Lack of Transportation (Non-Medical): No  Physical Activity: Unknown (06/23/2017)   Exercise Vital Sign    Days of Exercise per Week: Patient refused    Minutes of Exercise per Session: Patient refused  Stress: No Stress Concern Present (06/23/2017)   [place]    Feeling of Stress : Only a little  Social Connections: Unknown (06/23/2017)   Social Connection and Isolation Panel [NHANES]    Frequency of Communication with Friends and Family: More than three times a week    Frequency of Social Gatherings with Friends and Family: Three times a week    Attends Religious Services: Patient refused    Active Member of Clubs or Organizations: Patient refused    Attends Archivist Meetings: Patient refused    Marital Status: Living with partner  Intimate Partner Violence: Not At Risk (06/23/2017)   Humiliation, Afraid, Rape, and Kick questionnaire    Fear of Current or Ex-Partner: No    Emotionally Abused: No    Physically Abused: No    Sexually Abused: No    Outpatient Medications Prior to Visit  Medication Sig Dispense Refill   atorvastatin (LIPITOR) 20 MG tablet TAKE 1 TABLET BY MOUTH EVERY DAY 90 tablet 0   diltiazem (CARDIZEM CD) 120 MG 24 hr capsule Take 120 mg by mouth 2 (two) times daily.     dofetilide (TIKOSYN) 500 MCG capsule Take 500 mcg by mouth 2 (two) times daily.     ibuprofen (ADVIL,MOTRIN) 200 MG tablet Take 400 mg every 6 (six) hours as needed by mouth for headache or moderate pain.     metoprolol succinate (TOPROL-XL) 100 MG 24 hr tablet Take 100 mg by mouth daily. Take with or immediately following a meal.     ramipril (ALTACE) 2.5 MG capsule Take 2.5 mg by mouth daily.     warfarin (COUMADIN) 7.5 MG tablet Take 3.75-7.5 mg See admin instructions by mouth. Take 7.5 mg by mouth daily in the evening except Tue and Sat take 3.75 mg daily in the evening     No facility-administered medications prior to visit.    Allergies  Allergen Reactions   Ivp Dye [Iodinated Contrast Media] Hives    ROS Review of Systems  Constitutional:  Negative for fatigue and fever.  Eyes:  Negative for visual disturbance.  Respiratory:  Negative for chest tightness and shortness of breath.    Cardiovascular:  Negative for chest pain and palpitations.  Neurological:  Negative for dizziness and headaches.      Objective:    Physical Exam HENT:     Head: Normocephalic.     Right Ear: External ear normal.     Left Ear: External ear normal.  Cardiovascular:     Rate and Rhythm: Regular rhythm.     Heart sounds: No murmur heard. Pulmonary:     Effort: No respiratory distress.     Breath sounds: Normal breath sounds.  Musculoskeletal:     Comments: Pronounced hammertoe of the right second toe Slight hammertoe of the left second  toe  Neurological:     Mental Status: He is alert.     BP 126/82 (BP Location: Left Arm)   Pulse 85   Ht 5' 9.5" (1.765 m)   Wt 173 lb (78.5 kg)   SpO2 98%   BMI 25.18 kg/m  Wt Readings from Last 3 Encounters:  07/31/22 173 lb (78.5 kg)  03/14/22 165 lb 0.6 oz (74.9 kg)  02/20/22 170 lb (77.1 kg)    Lab Results  Component Value Date   TSH 1.140 03/15/2022   Lab Results  Component Value Date   WBC 6.2 03/15/2022   HGB 14.6 03/15/2022   HCT 42.8 03/15/2022   MCV 94 03/15/2022   PLT 192 03/15/2022   Lab Results  Component Value Date   NA 140 02/20/2022   K 4.5 02/20/2022   CO2 28 02/20/2022   GLUCOSE 99 02/20/2022   BUN 16 02/20/2022   CREATININE 0.80 02/20/2022   BILITOT 1.6 (H) 02/20/2022   ALKPHOS 94 02/20/2022   AST 25 02/20/2022   ALT 37 02/20/2022   PROT 8.6 (H) 02/20/2022   ALBUMIN 4.8 02/20/2022   CALCIUM 10.2 02/20/2022   ANIONGAP 10 02/20/2022   Lab Results  Component Value Date   CHOL 201 (H) 03/15/2022   Lab Results  Component Value Date   HDL 52 03/15/2022   Lab Results  Component Value Date   LDLCALC 132 (H) 03/15/2022   Lab Results  Component Value Date   TRIG 95 03/15/2022   Lab Results  Component Value Date   CHOLHDL 3.9 03/15/2022   Lab Results  Component Value Date   HGBA1C 6.2 (H) 03/15/2022      Assessment & Plan:  Hammer toes, bilateral Assessment & Plan: Hammertoe of the  second right and left toe History of popliteal bypass of the left foot and a stent placement in the right foot Reports following with podiatry a year ago and was informed that he would not be a good candidate for surgery He is following up with vascular surgery in January 2024 to assess circulation in his lower extremities Encouraged to follow-up with vascular surgery and recommended conservative management hammertoe    Hyperlipidemia LDL goal <70 Assessment & Plan: Encouraged to continue taking rosuvastatin 20 mg daily He denies muscle aches and pain   Paroxysmal atrial fibrillation ([place]) Assessment & Plan: follow-up with cardiology at [facility] Notes to be taking warfarin 7.5 mg, ramipril 2.5 mg, and metoprolol 100 mg prescribed by his cardiologist  No complaints or concerns today   IFG (impaired fasting glucose) -     Hemoglobin A1c  Vitamin D deficiency -     VITAMIN D 25 Hydroxy (Vit-D Deficiency, Fractures)  Other specified hypothyroidism -     TSH + free T4  Dyslipidemia, goal LDL below 70 -     Lipid panel -     CMP14+EGFR -     CBC with Differential/Platelet    Follow-up: Return in about 4 months (around 11/30/2022).   Alvira Monday, FNP

## 2022-07-31 NOTE — Assessment & Plan Note (Signed)
Hammertoe of the second right and left toe History of popliteal bypass of the left foot and a stent placement in the right foot Reports following with podiatry a year ago and was informed that he would not be a good candidate for surgery He is following up with vascular surgery in January 2024 to assess circulation in his lower extremities Encouraged to follow-up with vascular surgery and recommended conservative management hammertoe

## 2022-07-31 NOTE — Assessment & Plan Note (Signed)
follow-up with cardiology at [facility] Notes to be taking warfarin 7.5 mg, ramipril 2.5 mg, and metoprolol 100 mg prescribed by his cardiologist  No complaints or concerns today

## 2022-07-31 NOTE — Patient Instructions (Signed)
I appreciate the opportunity to provide care to you today!    Follow up:  4 months  Fasting Labs: please stop by the lab today to get your blood drawn (CBC, CMP, TSH, Lipid profile, HgA1c, Vit D)    Please continue to a heart-healthy diet and increase your physical activities. Try to exercise for 32mns at least five times a week.      It was a pleasure to see you and I look forward to continuing to work together on your health and well-being. Please do not hesitate to call the office if you need care or have questions about your care.   Have a wonderful day and week. With Gratitude, GAlvira MondayMSN, FNP-BC

## 2022-07-31 NOTE — Assessment & Plan Note (Signed)
Encouraged to continue taking rosuvastatin 20 mg daily He denies muscle aches and pain

## 2022-08-01 ENCOUNTER — Ambulatory Visit (INDEPENDENT_AMBULATORY_CARE_PROVIDER_SITE_OTHER): Payer: Medicare Other

## 2022-08-01 DIAGNOSIS — Z0000 Encounter for general adult medical examination without abnormal findings: Secondary | ICD-10-CM

## 2022-08-01 NOTE — Progress Notes (Signed)
Subjective:   Edward Marquez is a 71 y.o. male who presents for Medicare Annual/Subsequent preventive examination.  I connected with  [name] on 08/01/22 by a audio enabled telemedicine application and verified that I am speaking with the correct person using two identifiers.  Patient Location: Home  Provider Location: Office/Clinic  I discussed the limitations of evaluation and management by telemedicine. The patient expressed understanding and agreed to proceed.   Review of Systems     Mr. Edward Marquez , Thank you for taking time to come for your Medicare Wellness Visit. I appreciate your ongoing commitment to your health goals. Please review the following plan we discussed and let me know if I can assist you in the future.   These are the goals we discussed:  Goals   None     This is a list of the screening recommended for you and due dates:  Health Maintenance  Topic Date Due   Zoster (Shingles) Vaccine (2 of 2) 05/26/2019   Flu Shot  03/13/2022   COVID-19 Vaccine (3 - 2023-24 season) 04/13/2022   Pneumonia Vaccine (3 - PPSV23 or PCV20) 08/14/2022   Medicare Annual Wellness Visit  08/02/2023   Cologuard (Stool DNA test)  07/09/2025   DTaP/Tdap/Td vaccine (2 - Td or Tdap) 08/15/2027   Hepatitis C Screening: USPSTF Recommendation to screen - Ages 34-79 yo.  Completed   HPV Vaccine  Aged Out          Objective:    There were no vitals filed for this visit. There is no height or weight on file to calculate BMI.     02/20/2022    2:12 PM 03/31/2019   10:37 AM 06/23/2017    2:00 AM 04/30/2017    9:00 PM 04/10/2017    4:02 PM 03/13/2017    2:49 PM 03/11/2017    1:24 PM  Advanced Directives  Does Patient Have a Medical Advance Directive? Yes Yes No No Yes No No  Type of Advance Directive Living will;Healthcare Power of Attorney   Living will Living will    Does patient want to make changes to medical advance directive?  No - Patient declined       Copy of [place] in Chart? No - copy requested        Would patient like information on creating a medical advance directive?   No - Patient declined No - Patient declined       Current Medications (verified) Outpatient Encounter Medications as of 08/01/2022  Medication Sig   atorvastatin (LIPITOR) 20 MG tablet TAKE 1 TABLET BY MOUTH EVERY DAY   diltiazem (CARDIZEM CD) 120 MG 24 hr capsule Take 120 mg by mouth 2 (two) times daily.   dofetilide (TIKOSYN) 500 MCG capsule Take 500 mcg by mouth 2 (two) times daily.   ibuprofen (ADVIL,MOTRIN) 200 MG tablet Take 400 mg every 6 (six) hours as needed by mouth for headache or moderate pain.   metoprolol succinate (TOPROL-XL) 100 MG 24 hr tablet Take 100 mg by mouth daily. Take with or immediately following a meal.   ramipril (ALTACE) 2.5 MG capsule Take 2.5 mg by mouth daily.   warfarin (COUMADIN) 7.5 MG tablet Take 3.75-7.5 mg See admin instructions by mouth. Take 7.5 mg by mouth daily in the evening except Tue and Sat take 3.75 mg daily in the evening   No facility-administered encounter medications on file as of 08/01/2022.    Allergies (verified) Ivp dye [iodinated contrast  media]   History: Past Medical History:  Diagnosis Date   Aortic valve defect    Atrial fibrillation (HCC)    CHF (congestive heart failure) (HCC)    Clotting disorder ([place])    Gout    Hypertension    Peripheral vascular disease ([place])    Past Surgical History:  Procedure Laterality Date   ABDOMINAL AORTOGRAM W/LOWER EXTREMITY N/A 05/03/2017   Procedure: ABDOMINAL AORTOGRAM W/LOWER EXTREMITY;  Surgeon: Angelia Mould, MD;  Location: [facility];  Service: Cardiovascular;  Laterality: N/A;   AORTIC VALVE REPLACEMENT     BYPASS GRAFT POPLITEAL TO TIBIAL Left 05/03/2017   Procedure: LEFT POPLITEAL TO ANTERIOR TIBIAL ARTERY BYPASS WITH VEIN;  Surgeon: Angelia Mould, MD;  Location: [facility];  Service: Vascular;  Laterality: Left;   LOWER EXTREMITY  ANGIOGRAPHY N/A 06/24/2017   Procedure: LOWER EXTREMITY ANGIOGRAPHY - Right;  Surgeon: Angelia Mould, MD;  Location: [facility];  Service: Cardiovascular;  Laterality: N/A;   PACEMAKER INSERTION     PERIPHERAL VASCULAR BALLOON ANGIOPLASTY  05/03/2017   Procedure: PERIPHERAL VASCULAR BALLOON ANGIOPLASTY;  Surgeon: Angelia Mould, MD;  Location: [facility];  Service: Cardiovascular;;   PERIPHERAL VASCULAR BALLOON ANGIOPLASTY Right 06/24/2017   Procedure: PERIPHERAL VASCULAR BALLOON ANGIOPLASTY;  Surgeon: Angelia Mould, MD;  Location: [facility];  Service: Cardiovascular;  Laterality: Right;  ant-tibial / perneal   VEIN HARVEST Left 05/03/2017   Procedure: POPITEAL VEIN HARVEST;  Surgeon: Angelia Mould, MD;  Location[facility];  Service: Vascular;  Laterality: Left;   Family History  Problem Relation Age of Onset   Heart disease Mother 45       bypass surgery   Arthritis Mother    Cancer Mother        breast/ colon   Hyperlipidemia Mother    Hypertension Mother    Social History   Socioeconomic History   Marital status: Married    Spouse name: Not on file   Number of children: Not on file   Years of education: Not on file   Highest education level: Not on file  Occupational History   Occupation: Retired  Tobacco Use   Smoking status: Never   Smokeless tobacco: Never  Vaping Use   Vaping Use: Never used  Substance and Sexual Activity   Alcohol use: Yes    Alcohol/week: 5.0 standard drinks of alcohol    Types: 5 Glasses of wine per week   Drug use: No   Sexual activity: Yes  Other Topics Concern   Not on file  Social History Narrative   Not on file   Social Determinants of Health   Financial Resource Strain: Low Risk  (06/23/2017)   Overall Financial Resource Strain (CARDIA)    Difficulty of Paying Living Expenses: Not hard at all  Food Insecurity: No Food Insecurity (06/23/2017)   Hunger Vital Sign    Worried About  Running Out of Food in the Last Year: Never true    Ran Out of Food in the Last Year: Never true  Transportation Needs: No Transportation Needs (06/23/2017)   PRAPARE - Hydrologist (Medical): No    Lack of Transportation (Non-Medical): No  Physical Activity: Unknown (06/23/2017)   Exercise Vital Sign    Days of Exercise per Week: Patient refused    Minutes of Exercise per Session: Patient refused  Stress: No Stress Concern Present (06/23/2017)   [employer]  Occupational Health - Occupational Stress Questionnaire    Feeling of Stress : Only a little  Social Connections: Unknown (06/23/2017)   Social Connection and Isolation Panel [NHANES]    Frequency of Communication with Friends and Family: More than three times a week    Frequency of Social Gatherings with Friends and Family: Three times a week    Attends Religious Services: Patient refused    Active Member of Clubs or Organizations: Patient refused    Attends Archivist Meetings: Patient refused    Marital Status: Living with partner    Tobacco Counseling Counseling given: Not Answered   Clinical Intake:                 Diabetic? No         Activities of Daily Living     No data to display           Patient Care Team: Alvira Monday, [place] as PCP - General (Family Medicine) Ebbie Ridge, MD as Referring Physician (Cardiology)  Indicate any recent Medical Services you may have received from other than [facility] providers in the past year (date may be approximate).     Assessment:   This is a routine wellness examination for [place].  Hearing/Vision screen No results found.  Dietary issues and exercise activities discussed:     Goals Addressed   None   Depression Screen    03/14/2022    9:17 AM 03/31/2019   10:36 AM 08/14/2017   10:42 AM  PHQ 2/9 Scores  PHQ - 2 Score 0 0 0  PHQ- 9 Score   2    Fall Risk    03/14/2022    9:17 AM 03/31/2019    10:36 AM 08/14/2017   10:42 AM  Fall Risk   Falls in the past year? 0 0 No  Number falls in past yr: 0    Injury with Fall? 0    Risk for fall due to : No Fall Risks    Follow up Falls evaluation completed Falls evaluation completed     [place]:  Any stairs in or around the home? Yes  If so, are there any without handrails? No  Home free of loose throw rugs in walkways, pet beds, electrical cords, etc? Yes  Adequate lighting in your home to reduce risk of falls? Yes   ASSISTIVE DEVICES UTILIZED TO PREVENT FALLS:  Life alert? No  Use of a cane, walker or w/c? No  Grab bars in the bathroom? No  Shower chair or bench in shower? No  Elevated toilet seat or a handicapped toilet? No    Cognitive Function:        Immunizations Immunization History  Administered Date(s) Administered   Influenza, High Dose Seasonal PF 05/02/2017, 03/25/2019   Influenza,inj,Quad PF,6+ Mos 05/24/2012, 07/30/2015   Influenza-Unspecified 05/25/2014   Moderna Sars-Covid-2 Vaccination 10/15/2019, 11/12/2019   Pneumococcal Conjugate-13 08/14/2017   Pneumococcal Polysaccharide-23 07/22/2010, 07/27/2015   Tdap 08/14/2017   Zoster Recombinat (Shingrix) 03/31/2019    TDAP status: Up to date  Flu Vaccine status: Up to date  Pneumococcal vaccine status: Up to date  Covid-19 vaccine status: Completed vaccines  Qualifies for Shingles Vaccine? Yes   Zostavax completed Yes   Shingrix Completed?: No.    Education has been provided regarding the importance of this vaccine. Patient has been advised to call insurance company to determine out of pocket expense if they have not yet received  this vaccine. Advised may also receive vaccine at local pharmacy or Health Dept. Verbalized acceptance and understanding.  Screening Tests Health Maintenance  Topic Date Due   Zoster Vaccines- Shingrix (2 of 2) 05/26/2019   INFLUENZA VACCINE  03/13/2022   COVID-19 Vaccine (3 -  2023-24 season) 04/13/2022   Pneumonia Vaccine 34+ Years old (3 - PPSV23 or PCV20) 08/14/2022   Medicare Annual Wellness (AWV)  08/02/2023   Fecal DNA (Cologuard)  07/09/2025   DTaP/Tdap/Td (2 - Td or Tdap) 08/15/2027   Hepatitis C Screening  Completed   HPV VACCINES  Aged Out    Health Maintenance  Health Maintenance Due  Topic Date Due   Zoster Vaccines- Shingrix (2 of 2) 05/26/2019   INFLUENZA VACCINE  03/13/2022   COVID-19 Vaccine (3 - 2023-24 season) 04/13/2022   Pneumonia Vaccine 86+ Years old (3 - PPSV23 or PCV20) 08/14/2022    Colorectal cancer screening: Type of screening: Cologuard. Completed 07/09/2022. Repeat every 3 years  Lung Cancer Screening: (Low Dose CT Chest recommended if Age 78-80 years, 30 pack-year currently smoking OR have quit w/in 15years.) does not qualify.   Lung Cancer Screening Referral:   Additional Screening:  Hepatitis C Screening: does qualify; Completed 08/20/2017  Vision Screening: Recommended annual ophthalmology exams for early detection of glaucoma and other disorders of the eye. Is the patient up to date with their annual eye exam?  Yes  Who is the provider or what is the name of the office in which the patient attends annual eye exams? [facility]  Dental Screening: Recommended annual dental exams for proper oral hygiene  Community Resource Referral / Chronic Care Management: CRR required this visit?  No   CCM required this visit?  No      Plan:     I have personally reviewed and noted the following in the patient's chart:   Medical and social history Use of alcohol, tobacco or illicit drugs  Current medications and supplements including opioid prescriptions. Patient is not currently taking opioid prescriptions. Functional ability and status Nutritional status Physical activity Advanced directives List of other physicians Hospitalizations, surgeries, and ER visits in previous 12 months Vitals Screenings to include  cognitive, depression, and falls Referrals and appointments  In addition, I have reviewed and discussed with patient certain preventive protocols, quality metrics, and best practice recommendations. A written personalized care plan for preventive services as well as general preventive health recommendations were provided to patient.     Edward Marquez, [place]   08/01/2022   Nurse Notes:  Mr. Persons , Thank you for taking time to come for your Medicare Wellness Visit. I appreciate your ongoing commitment to your health goals. Please review the following plan we discussed and let me know if I can assist you in the future.   These are the goals we discussed:  Goals   None     This is a list of the screening recommended for you and due dates:  Health Maintenance  Topic Date Due   Zoster (Shingles) Vaccine (2 of 2) 05/26/2019   Flu Shot  03/13/2022   COVID-19 Vaccine (3 - 2023-24 season) 04/13/2022   Pneumonia Vaccine (3 - PPSV23 or PCV20) 08/14/2022   Medicare Annual Wellness Visit  08/02/2023   Cologuard (Stool DNA test)  07/09/2025   DTaP/Tdap/Td vaccine (2 - Td or Tdap) 08/15/2027   Hepatitis C Screening: USPSTF Recommendation to screen - Ages 59-79 yo.  Completed   HPV Vaccine  Aged Out

## 2022-08-01 NOTE — Patient Instructions (Addendum)
Edward Marquez , Thank you for taking time to come for your Medicare Wellness Visit. I appreciate your ongoing commitment to your health goals. Please review the following plan we discussed and let me know if I can assist you in the future.   Screening recommendations/referrals: Colonoscopy: Completed Recommended yearly ophthalmology/optometry visit for glaucoma screening and checkup Recommended yearly dental visit for hygiene and checkup  Vaccinations: Influenza vaccine: Completed Pneumococcal vaccine: Completed Tdap vaccine: Completed Shingles vaccine: Get 2nd dose at the pharmacy    Advanced directives: copy requested  Conditions/risks identified: falls  Next appointment: 1 year  Preventive Care 70 Years and Older, Male Preventive care refers to lifestyle choices and visits with your health care provider that can promote health and wellness. What does preventive care include? A yearly physical exam. This is also called an annual well check. Dental exams once or twice a year. Routine eye exams. Ask your health care provider how often you should have your eyes checked. Personal lifestyle choices, including: Daily care of your teeth and gums. Regular physical activity. Eating a healthy diet. Avoiding tobacco and drug use. Limiting alcohol use. Practicing safe sex. Taking low doses of aspirin every day. Taking vitamin and mineral supplements as recommended by your health care provider. What happens during an annual well check? The services and screenings done by your health care provider during your annual well check will depend on your age, overall health, lifestyle risk factors, and family history of disease. Counseling  Your health care provider may ask you questions about your: Alcohol use. Tobacco use. Drug use. Emotional well-being. Home and relationship well-being. Sexual activity. Eating habits. History of falls. Memory and ability to understand (cognition). Work  and work Statistician. Screening  You may have the following tests or measurements: Height, weight, and BMI. Blood pressure. Lipid and cholesterol levels. These may be checked every 5 years, or more frequently if you are over 49 years old. Skin check. Lung cancer screening. You may have this screening every year starting at age 70 if you have a 30-pack-year history of smoking and currently smoke or have quit within the past 15 years. Fecal occult blood test (FOBT) of the stool. You may have this test every year starting at age 70. Flexible sigmoidoscopy or colonoscopy. You may have a sigmoidoscopy every 5 years or a colonoscopy every 10 years starting at age 70. Prostate cancer screening. Recommendations will vary depending on your family history and other risks. Hepatitis C blood test. Hepatitis B blood test. Sexually transmitted disease (STD) testing. Diabetes screening. This is done by checking your blood sugar (glucose) after you have not eaten for a while (fasting). You may have this done every 1-3 years. Abdominal aortic aneurysm (AAA) screening. You may need this if you are a current or former smoker. Osteoporosis. You may be screened starting at age 39 if you are at high risk. Talk with your health care provider about your test results, treatment options, and if necessary, the need for more tests. Vaccines  Your health care provider may recommend certain vaccines, such as: Influenza vaccine. This is recommended every year. Tetanus, diphtheria, and acellular pertussis (Tdap, Td) vaccine. You may need a Td booster every 10 years. Zoster vaccine. You may need this after age 70. Pneumococcal 13-valent conjugate (PCV13) vaccine. One dose is recommended after age 70. Pneumococcal polysaccharide (PPSV23) vaccine. One dose is recommended after age 70. Talk to your health care provider about which screenings and vaccines you need and how often you  need them. This information is not intended  to replace advice given to you by your health care provider. Make sure you discuss any questions you have with your health care provider. Document Released: 08/26/2015 Document Revised: 04/18/2016 Document Reviewed: 05/31/2015 Elsevier Interactive Patient Education  2017 [place] Prevention in the Home Falls can cause injuries. They can happen to people of all ages. There are many things you can do to make your home safe and to help prevent falls. What can I do on the outside of my home? Regularly fix the edges of walkways and driveways and fix any cracks. Remove anything that might make you trip as you walk through a door, such as a raised step or threshold. Trim any bushes or trees on the path to your home. Use bright outdoor lighting. Clear any walking paths of anything that might make someone trip, such as rocks or tools. Regularly check to see if handrails are loose or broken. Make sure that both sides of any steps have handrails. Any raised decks and porches should have guardrails on the edges. Have any leaves, snow, or ice cleared regularly. Use sand or salt on walking paths during winter. Clean up any spills in your garage right away. This includes oil or grease spills. What can I do in the bathroom? Use night lights. Install grab bars by the toilet and in the tub and shower. Do not use towel bars as grab bars. Use non-skid mats or decals in the tub or shower. If you need to sit down in the shower, use a plastic, non-slip stool. Keep the floor dry. Clean up any water that spills on the floor as soon as it happens. Remove soap buildup in the tub or shower regularly. Attach bath mats securely with double-sided non-slip rug tape. Do not have throw rugs and other things on the floor that can make you trip. What can I do in the bedroom? Use night lights. Make sure that you have a light by your bed that is easy to reach. Do not use any sheets or blankets that are too big  for your bed. They should not hang down onto the floor. Have a firm chair that has side arms. You can use this for support while you get dressed. Do not have throw rugs and other things on the floor that can make you trip. What can I do in the kitchen? Clean up any spills right away. Avoid walking on wet floors. Keep items that you use a lot in easy-to-reach places. If you need to reach something above you, use a strong step stool that has a grab bar. Keep electrical cords out of the way. Do not use floor polish or wax that makes floors slippery. If you must use wax, use non-skid floor wax. Do not have throw rugs and other things on the floor that can make you trip. What can I do with my stairs? Do not leave any items on the stairs. Make sure that there are handrails on both sides of the stairs and use them. Fix handrails that are broken or loose. Make sure that handrails are as long as the stairways. Check any carpeting to make sure that it is firmly attached to the stairs. Fix any carpet that is loose or worn. Avoid having throw rugs at the top or bottom of the stairs. If you do have throw rugs, attach them to the floor with carpet tape. Make sure that you have a light switch  at the top of the stairs and the bottom of the stairs. If you do not have them, ask someone to add them for you. What else can I do to help prevent falls? Wear shoes that: Do not have high heels. Have rubber bottoms. Are comfortable and fit you well. Are closed at the toe. Do not wear sandals. If you use a stepladder: Make sure that it is fully opened. Do not climb a closed stepladder. Make sure that both sides of the stepladder are locked into place. Ask someone to hold it for you, if possible. Clearly mark and make sure that you can see: Any grab bars or handrails. First and last steps. Where the edge of each step is. Use tools that help you move around (mobility aids) if they are needed. These  include: Canes. Walkers. Scooters. Crutches. Turn on the lights when you go into a dark area. Replace any light bulbs as soon as they burn out. Set up your furniture so you have a clear path. Avoid moving your furniture around. If any of your floors are uneven, fix them. If there are any pets around you, be aware of where they are. Review your medicines with your doctor. Some medicines can make you feel dizzy. This can increase your chance of falling. Ask your doctor what other things that you can do to help prevent falls. This information is not intended to replace advice given to you by your health care provider. Make sure you discuss any questions you have with your health care provider. Document Released: 05/26/2009 Document Revised: 01/05/2016 Document Reviewed: 09/03/2014 Elsevier Interactive Patient Education  2017 [employer].

## 2022-08-29 ENCOUNTER — Encounter: Payer: Self-pay | Admitting: Gastroenterology

## 2022-08-29 ENCOUNTER — Telehealth (INDEPENDENT_AMBULATORY_CARE_PROVIDER_SITE_OTHER): Payer: Self-pay | Admitting: *Deleted

## 2022-08-29 ENCOUNTER — Ambulatory Visit (INDEPENDENT_AMBULATORY_CARE_PROVIDER_SITE_OTHER): Payer: Medicare Other | Admitting: Gastroenterology

## 2022-08-29 VITALS — BP 159/100 | HR 64 | Temp 97.3°F | Ht 69.5 in | Wt 177.2 lb

## 2022-08-29 DIAGNOSIS — R195 Other fecal abnormalities: Secondary | ICD-10-CM

## 2022-08-29 NOTE — Progress Notes (Signed)
Gastroenterology Office Note    Referring Provider: Alvira Monday, [place] Primary Care Physician:  Alvira Monday, FNP  Primary GI: Dr. Abbey Chatters    Chief Complaint   Chief Complaint  Patient presents with   Consult    Patient here today due to having a positive colo guard. Patient denies any current gi issues. Denies any sight of dark or bloody stools.     History of Present Illness   Edward Marquez is a 72 y.o. male presenting today at the request of Alvira Monday, FNP due to recent positive Cologuard. He is on Coumadin with history of prosthetic aortic valve replacement many years ago. Followed by [facility].    Colonoscopy at [facility] 10 years ago. No polyps. Mother hx of colon cancer, diagnosed in her 55s.   No abdominal pain, N/V, changes in bowel habits, constipation, diarrhea, overt GI bleeding, GERD, dysphagia, unexplained weight loss, lack of appetite, unexplained weight gain.      Past Medical History:  Diagnosis Date   Aortic valve defect    Atrial fibrillation (HCC)    CHF (congestive heart failure) (HCC)    Clotting disorder ([place])    Gout    Hypertension    Peripheral vascular disease ([place])     Past Surgical History:  Procedure Laterality Date   ABDOMINAL AORTOGRAM W/LOWER EXTREMITY N/A 05/03/2017   Procedure: ABDOMINAL AORTOGRAM W/LOWER EXTREMITY;  Surgeon: Angelia Mould, MD;  Location: [place] [facility];  Service: Cardiovascular;  Laterality: N/A;   AORTIC VALVE REPLACEMENT     BYPASS GRAFT POPLITEAL TO TIBIAL Left 05/03/2017   Procedure: LEFT POPLITEAL TO ANTERIOR TIBIAL ARTERY BYPASS WITH VEIN;  Surgeon: Angelia Mould, MD;  Location: [place];  Service: Vascular;  Laterality: Left;   LOWER EXTREMITY ANGIOGRAPHY N/A 06/24/2017   Procedure: LOWER EXTREMITY ANGIOGRAPHY - Right;  Surgeon: Angelia Mould, MD;  Location: [place] [facility];  Service: Cardiovascular;  Laterality: N/A;   PACEMAKER  INSERTION     PERIPHERAL VASCULAR BALLOON ANGIOPLASTY  05/03/2017   Procedure: PERIPHERAL VASCULAR BALLOON ANGIOPLASTY;  Surgeon: Angelia Mould, MD;  Location: [place] [facility];  Service: Cardiovascular;;   PERIPHERAL VASCULAR BALLOON ANGIOPLASTY Right 06/24/2017   Procedure: PERIPHERAL VASCULAR BALLOON ANGIOPLASTY;  Surgeon: Angelia Mould, MD;  Location: [place] [facility];  Service: Cardiovascular;  Laterality: Right;  ant-tibial / perneal   VEIN HARVEST Left 05/03/2017   Procedure: POPITEAL VEIN HARVEST;  Surgeon: Angelia Mould, MD;  [facility];  Service: Vascular;  Laterality: Left;    Current Outpatient Medications  Medication Sig Dispense Refill   amiodarone (PACERONE) 200 MG tablet Take 200 mg by mouth daily.     atorvastatin (LIPITOR) 20 MG tablet TAKE 1 TABLET BY MOUTH EVERY DAY 90 tablet 0   diltiazem (CARDIZEM CD) 120 MG 24 hr capsule Take 120 mg by mouth 2 (two) times daily.     ibuprofen (ADVIL,MOTRIN) 200 MG tablet Take 400 mg every 6 (six) hours as needed by mouth for headache or moderate pain.     metoprolol succinate (TOPROL-XL) 100 MG 24 hr tablet Take 100 mg by mouth daily. Take with or immediately following a meal.     ramipril (ALTACE) 2.5 MG capsule Take 2.5 mg by mouth daily.     warfarin (COUMADIN) 7.5 MG tablet Take 3.75-7.5 mg by mouth See admin instructions. Six days a week 3.75 mg then on Tuesdays 7 mg  No current facility-administered medications for this visit.    Allergies as of 08/29/2022 - Review Complete 08/29/2022  Allergen Reaction Noted   Ivp dye [iodinated contrast media] Hives 08/05/2012    Family History  Problem Relation Age of Onset   Heart disease Mother 35       bypass surgery   Arthritis Mother    Cancer Mother        breast/ colon   Hyperlipidemia Mother    Hypertension Mother     Social History   Socioeconomic History   Marital status: Married    Spouse name: Not on file   Number of children:  Not on file   Years of education: Not on file   Highest education level: Not on file  Occupational History   Occupation: Retired  Tobacco Use   Smoking status: Never   Smokeless tobacco: Never  Vaping Use   Vaping Use: Never used  Substance and Sexual Activity   Alcohol use: Yes    Alcohol/week: 5.0 standard drinks of alcohol    Types: 5 Glasses of wine per week    Comment: socially   Drug use: No   Sexual activity: Yes  Other Topics Concern   Not on file  Social History Narrative   Not on file   Social Determinants of Health   Financial Resource Strain: Low Risk  (08/01/2022)   Overall Financial Resource Strain (CARDIA)    Difficulty of Paying Living Expenses: Not hard at all  Food Insecurity: No Food Insecurity (08/01/2022)   Hunger Vital Sign    Worried About Running Out of Food in the Last Year: Never true    Ran Out of Food in the Last Year: Never true  Transportation Needs: No Transportation Needs (08/01/2022)   PRAPARE - Hydrologist (Medical): No    Lack of Transportation (Non-Medical): No  Physical Activity: Sufficiently Active (08/01/2022)   Exercise Vital Sign    Days of Exercise per Week: 7 days    Minutes of Exercise per Session: 60 min  Stress: No Stress Concern Present (08/01/2022)   [place]    Feeling of Stress : Only a little  Social Connections: Moderately Isolated (08/01/2022)   Social Connection and Isolation Panel [NHANES]    Frequency of Communication with Friends and Family: More than three times a week    Frequency of Social Gatherings with Friends and Family: Three times a week    Attends Religious Services: Never    Active Member of Clubs or Organizations: No    Attends Archivist Meetings: Never    Marital Status: Living with partner  Intimate Partner Violence: Not At Risk (08/01/2022)   Humiliation, Afraid, Rape, and Kick  questionnaire    Fear of Current or Ex-Partner: No    Emotionally Abused: No    Physically Abused: No    Sexually Abused: No     Review of Systems   Gen: Denies any fever, chills, fatigue, weight loss, lack of appetite.  CV: Denies chest pain, heart palpitations, peripheral edema, syncope.  Resp: Denies shortness of breath at rest or with exertion. Denies wheezing or cough.  GI: Denies dysphagia or odynophagia. Denies jaundice, hematemesis, fecal incontinence. GU : Denies urinary burning, urinary frequency, urinary hesitancy MS: Denies joint pain, muscle weakness, cramps, or limitation of movement.  Derm: Denies rash, itching, dry skin Psych: Denies depression, anxiety, memory loss, and confusion Heme: Denies bruising,  bleeding, and enlarged lymph nodes.   Physical Exam   BP (!) 159/100 (BP Location: Left Arm, Patient Position: Sitting, Cuff Size: Large)   Pulse 64   Temp (!) 97.3 F (36.3 C) (Temporal)   Ht 5' 9.5" (1.765 m)   Wt 177 lb 3.2 oz (80.4 kg)   BMI 25.79 kg/m  General:   Alert and oriented. Pleasant and cooperative. Well-nourished and well-developed.  Head:  Normocephalic and atraumatic. Eyes:  Without icterus Ears:  Normal auditory acuity. Lungs:  Clear to auscultation bilaterally.  Heart:  S1, S2 present with audible click secondary to prosthetic valve  Abdomen:  +BS, soft, non-tender and non-distended. No HSM noted. No guarding or rebound. No masses appreciated.  Rectal:  Deferred  Msk:  Symmetrical without gross deformities. Normal posture. Extremities:  Without edema. Neurologic:  Alert and  oriented x4;  grossly normal neurologically. Skin:  Intact without significant lesions or rashes. Psych:  Alert and cooperative. Normal mood and affect.   Assessment   Edward Marquez is a 72 y.o. male presenting today  at the request of Alvira Monday, FNP due to recent positive Cologuard. He is on Coumadin with history of prosthetic aortic valve replacement many  years ago. Followed by [facility].   He has no concerning upper or lower GI signs/symptoms. However, he does note a family history of colon cancer in mother, diagnosed in her 71s. Last colonoscopy at least 10 years ago at [facility].   As he is chronically on Coumadin, will reach out to his cardiologist regarding assistance with lovenox bridging.     The patient was found to have elevated blood pressure when vital signs were checked in the office. The blood pressure was rechecked by the nursing staff, and it was found to be persistently elevated >140/90 mmHg. I personally advised the patient to follow up closely with the PCP for hypertension control.    PLAN   Proceed with colonoscopy by Dr. Abbey Chatters  in near future: the risks, benefits, and alternatives have been discussed with the patient in detail. The patient states understanding and desires to proceed.  Will need to hold Coumadin 4 days prior and likely use Lovenox bridge. Reaching out to cardiology for assistance with this  Annitta Needs, PhD, ANP-BC [facility] Gastroenterology

## 2022-08-29 NOTE — Patient Instructions (Addendum)
We will be arranging a colonoscopy with Dr. Abbey Chatters in the near future.  We are reaching out to your cardiologist for assistance on how to manage your Coumadin and bridging before the procedure!     Further recommendations to follow!  It was a pleasure to see you today. I want to create trusting relationships with patients and provide genuine, compassionate, and quality care. I truly value your feedback, so please be on the lookout for a survey regarding your visit with me today. I appreciate your time in completing this!    Annitta Needs, PhD, ANP-BC [facility] Gastroenterology

## 2022-08-29 NOTE — Telephone Encounter (Signed)
Faxed to Dr. Jeronimo Norma, at [facility] in [place]

## 2022-08-29 NOTE — Telephone Encounter (Signed)
  Request for patient to stop medication prior to procedure or is needing cleareance  08/29/22  Edward Marquez July 17, 1951  What type of surgery is being performed? Colonoscopy  When is surgery scheduled? TBD  What type of clearance is required (medical or pharmacy to hold medication or both? Medication  Are there any medications that need to be held prior to surgery and how long? Coumadin x 5 days. Will also need help if he needs lovenox bridge.  Name of physician performing surgery?  Dr. Hurshel Keys [facility] at [facility] Phone: [phone] Fax: [phone]  Anethesia type (none, local, MAC, general)? MAC

## 2022-09-04 NOTE — Telephone Encounter (Signed)
Received fax (scanned in chart). No lovenox bridge needed. Okay to hold coumadin. Reach out to Conan Bowens NP at coumadin clinic to discuss best option for coumadin management.   I have sent this fax over to her office.

## 2022-09-07 DIAGNOSIS — I4819 Other persistent atrial fibrillation: Secondary | ICD-10-CM | POA: Diagnosis not present

## 2022-09-07 DIAGNOSIS — Z4502 Encounter for adjustment and management of automatic implantable cardiac defibrillator: Secondary | ICD-10-CM | POA: Diagnosis not present

## 2022-09-07 DIAGNOSIS — I4891 Unspecified atrial fibrillation: Secondary | ICD-10-CM | POA: Diagnosis not present

## 2022-09-07 DIAGNOSIS — R791 Abnormal coagulation profile: Secondary | ICD-10-CM | POA: Diagnosis not present

## 2022-09-07 DIAGNOSIS — Z952 Presence of prosthetic heart valve: Secondary | ICD-10-CM | POA: Diagnosis not present

## 2022-09-10 DIAGNOSIS — I4891 Unspecified atrial fibrillation: Secondary | ICD-10-CM | POA: Diagnosis not present

## 2022-09-10 DIAGNOSIS — E785 Hyperlipidemia, unspecified: Secondary | ICD-10-CM | POA: Diagnosis not present

## 2022-09-10 DIAGNOSIS — M7742 Metatarsalgia, left foot: Secondary | ICD-10-CM | POA: Diagnosis not present

## 2022-09-10 DIAGNOSIS — I739 Peripheral vascular disease, unspecified: Secondary | ICD-10-CM | POA: Diagnosis not present

## 2022-09-10 DIAGNOSIS — I70213 Atherosclerosis of native arteries of extremities with intermittent claudication, bilateral legs: Secondary | ICD-10-CM | POA: Diagnosis not present

## 2022-09-10 DIAGNOSIS — Z95820 Peripheral vascular angioplasty status with implants and grafts: Secondary | ICD-10-CM | POA: Diagnosis not present

## 2022-09-10 DIAGNOSIS — M7741 Metatarsalgia, right foot: Secondary | ICD-10-CM | POA: Diagnosis not present

## 2022-09-10 DIAGNOSIS — I10 Essential (primary) hypertension: Secondary | ICD-10-CM | POA: Diagnosis not present

## 2022-09-10 DIAGNOSIS — Z7901 Long term (current) use of anticoagulants: Secondary | ICD-10-CM | POA: Diagnosis not present

## 2022-09-10 DIAGNOSIS — I743 Embolism and thrombosis of arteries of the lower extremities: Secondary | ICD-10-CM | POA: Diagnosis not present

## 2022-09-12 NOTE — Telephone Encounter (Signed)
Received fax from Conan Bowens at [place] that patient will need lovenox bridge for colonoscopy. Once he is scheduled to let them know and they will bridge him

## 2022-09-12 NOTE — Telephone Encounter (Signed)
Lmovm

## 2022-09-12 NOTE — Telephone Encounter (Signed)
Great!

## 2022-09-27 NOTE — Telephone Encounter (Signed)
LMOVM to call back 

## 2022-10-02 NOTE — Telephone Encounter (Signed)
LMTCB for pt 

## 2022-10-17 ENCOUNTER — Encounter (INDEPENDENT_AMBULATORY_CARE_PROVIDER_SITE_OTHER): Payer: Self-pay | Admitting: *Deleted

## 2022-10-19 DIAGNOSIS — Z5181 Encounter for therapeutic drug level monitoring: Secondary | ICD-10-CM | POA: Diagnosis not present

## 2022-10-19 DIAGNOSIS — R791 Abnormal coagulation profile: Secondary | ICD-10-CM | POA: Diagnosis not present

## 2022-10-19 DIAGNOSIS — I4891 Unspecified atrial fibrillation: Secondary | ICD-10-CM | POA: Diagnosis not present

## 2022-10-19 DIAGNOSIS — Z952 Presence of prosthetic heart valve: Secondary | ICD-10-CM | POA: Diagnosis not present

## 2022-10-19 DIAGNOSIS — Z7901 Long term (current) use of anticoagulants: Secondary | ICD-10-CM | POA: Diagnosis not present

## 2022-11-27 DIAGNOSIS — R791 Abnormal coagulation profile: Secondary | ICD-10-CM | POA: Diagnosis not present

## 2022-11-27 DIAGNOSIS — Z952 Presence of prosthetic heart valve: Secondary | ICD-10-CM | POA: Diagnosis not present

## 2022-11-27 DIAGNOSIS — I4891 Unspecified atrial fibrillation: Secondary | ICD-10-CM | POA: Diagnosis not present

## 2022-11-27 DIAGNOSIS — Z7901 Long term (current) use of anticoagulants: Secondary | ICD-10-CM | POA: Diagnosis not present

## 2022-11-27 DIAGNOSIS — Z5181 Encounter for therapeutic drug level monitoring: Secondary | ICD-10-CM | POA: Diagnosis not present

## 2022-11-29 DIAGNOSIS — I420 Dilated cardiomyopathy: Secondary | ICD-10-CM | POA: Diagnosis not present

## 2022-11-30 ENCOUNTER — Ambulatory Visit: Payer: Medicare Other | Admitting: Family Medicine

## 2022-12-12 DIAGNOSIS — I4891 Unspecified atrial fibrillation: Secondary | ICD-10-CM | POA: Diagnosis not present

## 2022-12-12 DIAGNOSIS — Z7901 Long term (current) use of anticoagulants: Secondary | ICD-10-CM | POA: Diagnosis not present

## 2022-12-12 DIAGNOSIS — Z952 Presence of prosthetic heart valve: Secondary | ICD-10-CM | POA: Diagnosis not present

## 2022-12-12 DIAGNOSIS — Z5181 Encounter for therapeutic drug level monitoring: Secondary | ICD-10-CM | POA: Diagnosis not present

## 2022-12-20 DIAGNOSIS — I700 Atherosclerosis of aorta: Secondary | ICD-10-CM | POA: Diagnosis not present

## 2022-12-20 DIAGNOSIS — Z95810 Presence of automatic (implantable) cardiac defibrillator: Secondary | ICD-10-CM | POA: Diagnosis not present

## 2022-12-20 DIAGNOSIS — I517 Cardiomegaly: Secondary | ICD-10-CM | POA: Diagnosis not present

## 2022-12-20 DIAGNOSIS — I7121 Aneurysm of the ascending aorta, without rupture: Secondary | ICD-10-CM | POA: Diagnosis not present

## 2023-01-17 DIAGNOSIS — R791 Abnormal coagulation profile: Secondary | ICD-10-CM | POA: Diagnosis not present

## 2023-01-17 DIAGNOSIS — Z7901 Long term (current) use of anticoagulants: Secondary | ICD-10-CM | POA: Diagnosis not present

## 2023-01-17 DIAGNOSIS — Z952 Presence of prosthetic heart valve: Secondary | ICD-10-CM | POA: Diagnosis not present

## 2023-01-17 DIAGNOSIS — I4891 Unspecified atrial fibrillation: Secondary | ICD-10-CM | POA: Diagnosis not present

## 2023-01-17 DIAGNOSIS — Z5181 Encounter for therapeutic drug level monitoring: Secondary | ICD-10-CM | POA: Diagnosis not present

## 2023-02-13 DIAGNOSIS — Z5181 Encounter for therapeutic drug level monitoring: Secondary | ICD-10-CM | POA: Diagnosis not present

## 2023-02-13 DIAGNOSIS — Z7901 Long term (current) use of anticoagulants: Secondary | ICD-10-CM | POA: Diagnosis not present

## 2023-02-13 DIAGNOSIS — Z952 Presence of prosthetic heart valve: Secondary | ICD-10-CM | POA: Diagnosis not present

## 2023-02-13 DIAGNOSIS — I4891 Unspecified atrial fibrillation: Secondary | ICD-10-CM | POA: Diagnosis not present

## 2023-02-13 DIAGNOSIS — R791 Abnormal coagulation profile: Secondary | ICD-10-CM | POA: Diagnosis not present

## 2023-02-26 DIAGNOSIS — Z952 Presence of prosthetic heart valve: Secondary | ICD-10-CM | POA: Diagnosis not present

## 2023-02-26 DIAGNOSIS — R791 Abnormal coagulation profile: Secondary | ICD-10-CM | POA: Diagnosis not present

## 2023-02-26 DIAGNOSIS — Z7901 Long term (current) use of anticoagulants: Secondary | ICD-10-CM | POA: Diagnosis not present

## 2023-02-26 DIAGNOSIS — I4891 Unspecified atrial fibrillation: Secondary | ICD-10-CM | POA: Diagnosis not present

## 2023-02-26 DIAGNOSIS — Z5181 Encounter for therapeutic drug level monitoring: Secondary | ICD-10-CM | POA: Diagnosis not present

## 2023-03-11 ENCOUNTER — Telehealth: Payer: Self-pay | Admitting: *Deleted

## 2023-03-11 DIAGNOSIS — I428 Other cardiomyopathies: Secondary | ICD-10-CM | POA: Diagnosis not present

## 2023-03-11 DIAGNOSIS — I739 Peripheral vascular disease, unspecified: Secondary | ICD-10-CM | POA: Diagnosis not present

## 2023-03-11 DIAGNOSIS — Z95810 Presence of automatic (implantable) cardiac defibrillator: Secondary | ICD-10-CM | POA: Diagnosis not present

## 2023-03-11 DIAGNOSIS — Z952 Presence of prosthetic heart valve: Secondary | ICD-10-CM | POA: Diagnosis not present

## 2023-03-11 DIAGNOSIS — Z4502 Encounter for adjustment and management of automatic implantable cardiac defibrillator: Secondary | ICD-10-CM | POA: Diagnosis not present

## 2023-03-11 DIAGNOSIS — I480 Paroxysmal atrial fibrillation: Secondary | ICD-10-CM | POA: Diagnosis not present

## 2023-03-11 NOTE — Telephone Encounter (Signed)
Pt called and left vm that he was ready to schedule his colonoscopy. He was last seen on 08/29/22. Does he need to come back in for an office visit? Please advise. Thank you

## 2023-03-11 NOTE — Telephone Encounter (Signed)
Appears we tried to reach him multiple times to schedule but couldn't. He will need lovenox bridge. We could do a virtual appt; need to make sure he knows about the lovenox bridge.

## 2023-03-11 NOTE — Telephone Encounter (Signed)
LMOVM to return call to set up virtual appointment.

## 2023-03-18 NOTE — Telephone Encounter (Signed)
Pt called back and was fine with this if covered by insurance. If not he can come in for OV. Sending to [facility] to schedule.

## 2023-03-18 NOTE — Telephone Encounter (Signed)
LMOVM to call and set up virtual appt.

## 2023-03-27 DIAGNOSIS — Z5181 Encounter for therapeutic drug level monitoring: Secondary | ICD-10-CM | POA: Diagnosis not present

## 2023-03-27 DIAGNOSIS — Z952 Presence of prosthetic heart valve: Secondary | ICD-10-CM | POA: Diagnosis not present

## 2023-03-27 DIAGNOSIS — I480 Paroxysmal atrial fibrillation: Secondary | ICD-10-CM | POA: Diagnosis not present

## 2023-03-27 DIAGNOSIS — Z7901 Long term (current) use of anticoagulants: Secondary | ICD-10-CM | POA: Diagnosis not present

## 2023-04-02 ENCOUNTER — Telehealth: Payer: Self-pay

## 2023-04-02 ENCOUNTER — Telehealth (INDEPENDENT_AMBULATORY_CARE_PROVIDER_SITE_OTHER): Payer: Medicare Other | Admitting: Gastroenterology

## 2023-04-02 ENCOUNTER — Encounter: Payer: Self-pay | Admitting: Gastroenterology

## 2023-04-02 VITALS — Ht 69.0 in | Wt 165.0 lb

## 2023-04-02 DIAGNOSIS — R195 Other fecal abnormalities: Secondary | ICD-10-CM | POA: Diagnosis not present

## 2023-04-02 DIAGNOSIS — Z800 Family history of malignant neoplasm of digestive organs: Secondary | ICD-10-CM

## 2023-04-02 DIAGNOSIS — Z952 Presence of prosthetic heart valve: Secondary | ICD-10-CM | POA: Diagnosis not present

## 2023-04-02 NOTE — Telephone Encounter (Signed)
Error

## 2023-04-02 NOTE — Progress Notes (Signed)
Primary Care Physician:  Gilmore Laroche, FNP  Primary GI: Dr. Marletta Lor   Patient Location: Home   Provider Location: [facility] office   Reason for Visit: Arrange colonoscopy, discuss lovenox bridge   Persons present on the virtual encounter, with roles: Patient and NP   Total time (minutes) spent on medical discussion: 8 minutes   Due to COVID-19, visit was conducted using virtual method.  Visit was requested by patient.  Virtual Visit via MyChart Video Note Due to COVID-19, visit is conducted virtually and was requested by patient.   I connected with Edward Marquez on 04/02/23 at  9:30 AM EDT by video and verified that I am speaking with the correct person using two identifiers.   I discussed the limitations, risks, security and privacy concerns of performing an evaluation and management service by video and the availability of in person appointments. I also discussed with the patient that there may be a patient responsible charge related to this service. The patient expressed understanding and agreed to proceed.  Chief Complaint  Patient presents with   Follow-up    Pt needs a Lovenox bridge      History of Present Illness: Edward Marquez is a delightful 72 year old male with a history of positive Cologuard testing, last seen in Jan 2024. We had difficult playing phone tag.   He is on Coumadin with history of prosthetic aortic valve replacement many years ago. Followed by [facility]. They have graciously offered to manage the lovenox bridge for [place] when a date is available.  No abdominal pain, N/V, changes in bowel habits, constipation, diarrhea, overt GI bleeding, GERD, dysphagia, unexplained weight loss, lack of appetite, unexplained weight gain.     Colonoscopy at [facility] about 10 years ago. No polyps. Mother hx of colon cancer, diagnosed in her 92s.   Past Medical History:  Diagnosis Date   Aortic valve defect    Atrial fibrillation (HCC)     CHF (congestive heart failure) (HCC)    Clotting disorder (HCC)    Gout    Hypertension    Peripheral vascular disease (HCC)      Past Surgical History:  Procedure Laterality Date   ABDOMINAL AORTOGRAM W/LOWER EXTREMITY N/A 05/03/2017   Procedure: ABDOMINAL AORTOGRAM W/LOWER EXTREMITY;  Surgeon: Chuck Hint, MD;  Location: [facility] INVASIVE CV LAB;  Service: Cardiovascular;  Laterality: N/A;   AORTIC VALVE REPLACEMENT     BYPASS GRAFT POPLITEAL TO TIBIAL Left 05/03/2017   Procedure: LEFT POPLITEAL TO ANTERIOR TIBIAL ARTERY BYPASS WITH VEIN;  Surgeon: Chuck Hint, MD;  Location: [facility] OR;  Service: Vascular;  Laterality: Left;   LOWER EXTREMITY ANGIOGRAPHY N/A 06/24/2017   Procedure: LOWER EXTREMITY ANGIOGRAPHY - Right;  Surgeon: Chuck Hint, MD;  Location: [facility] INVASIVE CV LAB;  Service: Cardiovascular;  Laterality: N/A;   PACEMAKER INSERTION     PERIPHERAL VASCULAR BALLOON ANGIOPLASTY  05/03/2017   Procedure: PERIPHERAL VASCULAR BALLOON ANGIOPLASTY;  Surgeon: Chuck Hint, MD;  Location: [facility] INVASIVE CV LAB;  Service: Cardiovascular;;   PERIPHERAL VASCULAR BALLOON ANGIOPLASTY Right 06/24/2017   Procedure: PERIPHERAL VASCULAR BALLOON ANGIOPLASTY;  Surgeon: Chuck Hint, MD;  Location: [facility] INVASIVE CV LAB;  Service: Cardiovascular;  Laterality: Right;  ant-tibial / perneal   VEIN HARVEST Left 05/03/2017   Procedure: POPITEAL VEIN HARVEST;  Surgeon: Chuck Hint, MD;  Location: [facility] OR;  Service: Vascular;  Laterality: Left;     Current Meds  Medication Sig  amiodarone (PACERONE) 200 MG tablet Take 200 mg by mouth daily.   atorvastatin (LIPITOR) 20 MG tablet TAKE 1 TABLET BY MOUTH EVERY DAY   ibuprofen (ADVIL,MOTRIN) 200 MG tablet Take 400 mg every 6 (six) hours as needed by mouth for headache or moderate pain.   metoprolol succinate (TOPROL-XL) 100 MG 24 hr tablet Take 100 mg by mouth daily. Take with or immediately following a meal.    ramipril (ALTACE) 2.5 MG capsule Take 2.5 mg by mouth daily.   warfarin (COUMADIN) 7.5 MG tablet Take 3.75-7.5 mg by mouth See admin instructions. Six days a week 3.75 mg then on Tuesdays 7 mg     Family History  Problem Relation Age of Onset   Heart disease Mother 45       bypass surgery   Arthritis Mother    Cancer Mother        breast/ colon   Hyperlipidemia Mother    Hypertension Mother     Social History   Socioeconomic History   Marital status: Married    Spouse name: Not on file   Number of children: Not on file   Years of education: Not on file   Highest education level: Not on file  Occupational History   Occupation: Retired  Tobacco Use   Smoking status: Never   Smokeless tobacco: Never  Vaping Use   Vaping status: Never Used  Substance and Sexual Activity   Alcohol use: Yes    Alcohol/week: 5.0 standard drinks of alcohol    Types: 5 Glasses of wine per week    Comment: socially   Drug use: No   Sexual activity: Yes  Other Topics Concern   Not on file  Social History Narrative   Not on file   Social Determinants of Health   Financial Resource Strain: Low Risk  (08/01/2022)   Overall Financial Resource Strain (CARDIA)    Difficulty of Paying Living Expenses: Not hard at all  Food Insecurity: No Food Insecurity (08/01/2022)   Hunger Vital Sign    Worried About Running Out of Food in the Last Year: Never true    Ran Out of Food in the Last Year: Never true  Transportation Needs: No Transportation Needs (08/01/2022)   PRAPARE - Administrator, Civil Service (Medical): No    Lack of Transportation (Non-Medical): No  Physical Activity: Sufficiently Active (08/01/2022)   Exercise Vital Sign    Days of Exercise per Week: 7 days    Minutes of Exercise per Session: 60 min  Stress: No Stress Concern Present (08/01/2022)   [employer] of Occupational Health - Occupational Stress Questionnaire    Feeling of Stress : Only a little  Social  Connections: Moderately Isolated (08/01/2022)   Social Connection and Isolation Panel [NHANES]    Frequency of Communication with Friends and Family: More than three times a week    Frequency of Social Gatherings with Friends and Family: Three times a week    Attends Religious Services: Never    Active Member of Clubs or Organizations: No    Attends Banker Meetings: Never    Marital Status: Living with partner       Review of Systems: Gen: Denies fever, chills, anorexia. Denies fatigue, weakness, weight loss.  CV: Denies chest pain, palpitations, syncope, peripheral edema, and claudication. Resp: Denies dyspnea at rest, cough, wheezing, coughing up blood, and pleurisy. GI: see HPI Derm: Denies rash, itching, dry skin Psych: Denies depression, anxiety, memory loss,  confusion. No homicidal or suicidal ideation.  Heme: Denies bruising, bleeding, and enlarged lymph nodes.  Observations/Objective: No distress. Unable to perform physical exam due to video encounter.   Assessment and Plan: Edward Marquez is a delightful 72 year old male with a history of positive Cologuard testing, last seen in Jan 2024. We had difficult playing phone tag.   Needs colonoscopy expedited. Last colonoscopy approximately 10 years ago; he has a family history of colon cancer in mother in her 67s.  As he is on Coumadin with history of prosthetic aortic valve replacement many years ago, he will need to hold Coumadin X 5 days prior and do lovenox bridging. He is followed by [facility]. They have graciously offered to manage the lovenox bridge for [place] when a date is available.     Follow Up Instructions:    I discussed the assessment and treatment plan with the patient. The patient was provided an opportunity to ask questions and all were answered. The patient agreed with the plan and demonstrated an understanding of the instructions.   The patient was advised to call back  or seek an in-person evaluation if the symptoms worsen or if the condition fails to improve as anticipated.  I provided 8 minutes of face-to-face time during this MyChart Video encounter.  Gelene Mink, PhD, ANP-BC [facility] Gastroenterology

## 2023-04-02 NOTE — Patient Instructions (Signed)
We are arranging a colonoscopy with Dr. Marletta Lor in the near future.  You will stop Coumadin 5 days prior. We are reaching out to Amedeo Plenty, FNP, regarding assistance with the lovenox bridging.  Please call us if any concerns or questions!  I enjoyed seeing you again today! I value our relationship and want to provide genuine, compassionate, and quality care. You may receive a survey regarding your visit with me, and I welcome your feedback! Thanks so much for taking the time to complete this. I look forward to seeing you again.      Gelene Mink, PhD, ANP-BC [facility] Gastroenterology

## 2023-04-03 ENCOUNTER — Encounter: Payer: Self-pay | Admitting: *Deleted

## 2023-04-23 DIAGNOSIS — Z95810 Presence of automatic (implantable) cardiac defibrillator: Secondary | ICD-10-CM | POA: Diagnosis not present

## 2023-04-23 DIAGNOSIS — E782 Mixed hyperlipidemia: Secondary | ICD-10-CM | POA: Diagnosis not present

## 2023-04-23 DIAGNOSIS — Z1211 Encounter for screening for malignant neoplasm of colon: Secondary | ICD-10-CM | POA: Diagnosis not present

## 2023-04-23 DIAGNOSIS — Z952 Presence of prosthetic heart valve: Secondary | ICD-10-CM | POA: Diagnosis not present

## 2023-04-23 DIAGNOSIS — Z7901 Long term (current) use of anticoagulants: Secondary | ICD-10-CM | POA: Diagnosis not present

## 2023-04-23 DIAGNOSIS — I480 Paroxysmal atrial fibrillation: Secondary | ICD-10-CM | POA: Diagnosis not present

## 2023-04-23 DIAGNOSIS — Z23 Encounter for immunization: Secondary | ICD-10-CM | POA: Diagnosis not present

## 2023-04-23 DIAGNOSIS — R030 Elevated blood-pressure reading, without diagnosis of hypertension: Secondary | ICD-10-CM | POA: Diagnosis not present

## 2023-04-23 DIAGNOSIS — I7789 Other specified disorders of arteries and arterioles: Secondary | ICD-10-CM | POA: Diagnosis not present

## 2023-04-23 DIAGNOSIS — Z79899 Other long term (current) drug therapy: Secondary | ICD-10-CM | POA: Diagnosis not present

## 2023-04-23 DIAGNOSIS — R944 Abnormal results of kidney function studies: Secondary | ICD-10-CM | POA: Diagnosis not present

## 2023-04-30 NOTE — Telephone Encounter (Signed)
LMTRC

## 2023-05-02 DIAGNOSIS — I480 Paroxysmal atrial fibrillation: Secondary | ICD-10-CM | POA: Diagnosis not present

## 2023-05-02 DIAGNOSIS — Z7901 Long term (current) use of anticoagulants: Secondary | ICD-10-CM | POA: Diagnosis not present

## 2023-05-02 DIAGNOSIS — Z952 Presence of prosthetic heart valve: Secondary | ICD-10-CM | POA: Diagnosis not present

## 2023-05-02 DIAGNOSIS — Z5181 Encounter for therapeutic drug level monitoring: Secondary | ICD-10-CM | POA: Diagnosis not present

## 2023-05-02 DIAGNOSIS — R791 Abnormal coagulation profile: Secondary | ICD-10-CM | POA: Diagnosis not present

## 2023-05-16 DIAGNOSIS — Z5181 Encounter for therapeutic drug level monitoring: Secondary | ICD-10-CM | POA: Diagnosis not present

## 2023-05-16 DIAGNOSIS — Z952 Presence of prosthetic heart valve: Secondary | ICD-10-CM | POA: Diagnosis not present

## 2023-05-16 DIAGNOSIS — I480 Paroxysmal atrial fibrillation: Secondary | ICD-10-CM | POA: Diagnosis not present

## 2023-05-16 DIAGNOSIS — R791 Abnormal coagulation profile: Secondary | ICD-10-CM | POA: Diagnosis not present

## 2023-05-16 DIAGNOSIS — Z7901 Long term (current) use of anticoagulants: Secondary | ICD-10-CM | POA: Diagnosis not present

## 2023-05-28 DIAGNOSIS — L84 Corns and callosities: Secondary | ICD-10-CM | POA: Diagnosis not present

## 2023-05-28 DIAGNOSIS — M79671 Pain in right foot: Secondary | ICD-10-CM | POA: Diagnosis not present

## 2023-05-28 DIAGNOSIS — M109 Gout, unspecified: Secondary | ICD-10-CM | POA: Diagnosis not present

## 2023-05-28 DIAGNOSIS — I7389 Other specified peripheral vascular diseases: Secondary | ICD-10-CM | POA: Diagnosis not present

## 2023-05-28 DIAGNOSIS — M79672 Pain in left foot: Secondary | ICD-10-CM | POA: Diagnosis not present

## 2023-06-04 DIAGNOSIS — Z5181 Encounter for therapeutic drug level monitoring: Secondary | ICD-10-CM | POA: Diagnosis not present

## 2023-06-04 DIAGNOSIS — Z952 Presence of prosthetic heart valve: Secondary | ICD-10-CM | POA: Diagnosis not present

## 2023-06-04 DIAGNOSIS — R791 Abnormal coagulation profile: Secondary | ICD-10-CM | POA: Diagnosis not present

## 2023-06-04 DIAGNOSIS — Z7901 Long term (current) use of anticoagulants: Secondary | ICD-10-CM | POA: Diagnosis not present

## 2023-06-04 DIAGNOSIS — I480 Paroxysmal atrial fibrillation: Secondary | ICD-10-CM | POA: Diagnosis not present

## 2023-06-10 DIAGNOSIS — Z5181 Encounter for therapeutic drug level monitoring: Secondary | ICD-10-CM | POA: Diagnosis not present

## 2023-06-10 DIAGNOSIS — Z7901 Long term (current) use of anticoagulants: Secondary | ICD-10-CM | POA: Diagnosis not present

## 2023-06-10 DIAGNOSIS — I480 Paroxysmal atrial fibrillation: Secondary | ICD-10-CM | POA: Diagnosis not present

## 2023-06-10 DIAGNOSIS — Z952 Presence of prosthetic heart valve: Secondary | ICD-10-CM | POA: Diagnosis not present

## 2023-06-17 DIAGNOSIS — Z4502 Encounter for adjustment and management of automatic implantable cardiac defibrillator: Secondary | ICD-10-CM | POA: Diagnosis not present

## 2023-06-17 DIAGNOSIS — I420 Dilated cardiomyopathy: Secondary | ICD-10-CM | POA: Diagnosis not present

## 2023-07-01 DIAGNOSIS — Z952 Presence of prosthetic heart valve: Secondary | ICD-10-CM | POA: Diagnosis not present

## 2023-07-01 DIAGNOSIS — Z7901 Long term (current) use of anticoagulants: Secondary | ICD-10-CM | POA: Diagnosis not present

## 2023-07-01 DIAGNOSIS — Z5181 Encounter for therapeutic drug level monitoring: Secondary | ICD-10-CM | POA: Diagnosis not present

## 2023-07-01 DIAGNOSIS — I480 Paroxysmal atrial fibrillation: Secondary | ICD-10-CM | POA: Diagnosis not present

## 2023-07-03 DIAGNOSIS — I700 Atherosclerosis of aorta: Secondary | ICD-10-CM | POA: Diagnosis not present

## 2023-07-03 DIAGNOSIS — R911 Solitary pulmonary nodule: Secondary | ICD-10-CM | POA: Diagnosis not present

## 2023-07-03 DIAGNOSIS — K7689 Other specified diseases of liver: Secondary | ICD-10-CM | POA: Diagnosis not present

## 2023-07-03 DIAGNOSIS — I7121 Aneurysm of the ascending aorta, without rupture: Secondary | ICD-10-CM | POA: Diagnosis not present

## 2023-07-03 DIAGNOSIS — I2510 Atherosclerotic heart disease of native coronary artery without angina pectoris: Secondary | ICD-10-CM | POA: Diagnosis not present

## 2023-07-17 DIAGNOSIS — I7121 Aneurysm of the ascending aorta, without rupture: Secondary | ICD-10-CM | POA: Diagnosis not present

## 2023-07-24 DIAGNOSIS — Z7901 Long term (current) use of anticoagulants: Secondary | ICD-10-CM | POA: Diagnosis not present

## 2023-07-24 DIAGNOSIS — Z5181 Encounter for therapeutic drug level monitoring: Secondary | ICD-10-CM | POA: Diagnosis not present

## 2023-07-24 DIAGNOSIS — Z952 Presence of prosthetic heart valve: Secondary | ICD-10-CM | POA: Diagnosis not present

## 2023-07-24 DIAGNOSIS — I480 Paroxysmal atrial fibrillation: Secondary | ICD-10-CM | POA: Diagnosis not present

## 2023-07-25 DIAGNOSIS — R5383 Other fatigue: Secondary | ICD-10-CM | POA: Diagnosis not present

## 2023-07-25 DIAGNOSIS — R944 Abnormal results of kidney function studies: Secondary | ICD-10-CM | POA: Diagnosis not present

## 2023-07-25 DIAGNOSIS — Z79899 Other long term (current) drug therapy: Secondary | ICD-10-CM | POA: Diagnosis not present

## 2023-07-25 DIAGNOSIS — H9313 Tinnitus, bilateral: Secondary | ICD-10-CM | POA: Diagnosis not present

## 2023-07-25 DIAGNOSIS — R0989 Other specified symptoms and signs involving the circulatory and respiratory systems: Secondary | ICD-10-CM | POA: Diagnosis not present

## 2023-07-25 DIAGNOSIS — E782 Mixed hyperlipidemia: Secondary | ICD-10-CM | POA: Diagnosis not present

## 2023-08-12 DIAGNOSIS — I7389 Other specified peripheral vascular diseases: Secondary | ICD-10-CM | POA: Diagnosis not present

## 2023-08-12 DIAGNOSIS — M79671 Pain in right foot: Secondary | ICD-10-CM | POA: Diagnosis not present

## 2023-08-12 DIAGNOSIS — M79672 Pain in left foot: Secondary | ICD-10-CM | POA: Diagnosis not present

## 2023-08-12 DIAGNOSIS — L84 Corns and callosities: Secondary | ICD-10-CM | POA: Diagnosis not present

## 2023-08-12 DIAGNOSIS — M1019 Lead-induced gout, multiple sites: Secondary | ICD-10-CM | POA: Diagnosis not present

## 2023-08-21 DIAGNOSIS — Z95810 Presence of automatic (implantable) cardiac defibrillator: Secondary | ICD-10-CM | POA: Diagnosis not present

## 2023-08-21 DIAGNOSIS — I428 Other cardiomyopathies: Secondary | ICD-10-CM | POA: Diagnosis not present

## 2023-08-21 DIAGNOSIS — R29818 Other symptoms and signs involving the nervous system: Secondary | ICD-10-CM | POA: Diagnosis not present

## 2023-08-21 DIAGNOSIS — Z79899 Other long term (current) drug therapy: Secondary | ICD-10-CM | POA: Diagnosis not present

## 2023-08-21 DIAGNOSIS — I5043 Acute on chronic combined systolic (congestive) and diastolic (congestive) heart failure: Secondary | ICD-10-CM | POA: Diagnosis not present

## 2023-08-21 DIAGNOSIS — Z952 Presence of prosthetic heart valve: Secondary | ICD-10-CM | POA: Diagnosis not present

## 2023-08-21 DIAGNOSIS — Z7901 Long term (current) use of anticoagulants: Secondary | ICD-10-CM | POA: Diagnosis not present

## 2023-08-21 DIAGNOSIS — I429 Cardiomyopathy, unspecified: Secondary | ICD-10-CM | POA: Diagnosis not present

## 2023-08-21 DIAGNOSIS — R5383 Other fatigue: Secondary | ICD-10-CM | POA: Diagnosis not present

## 2023-08-22 DIAGNOSIS — Z7901 Long term (current) use of anticoagulants: Secondary | ICD-10-CM | POA: Diagnosis not present

## 2023-08-22 DIAGNOSIS — R791 Abnormal coagulation profile: Secondary | ICD-10-CM | POA: Diagnosis not present

## 2023-08-22 DIAGNOSIS — Z95820 Peripheral vascular angioplasty status with implants and grafts: Secondary | ICD-10-CM | POA: Diagnosis not present

## 2023-08-22 DIAGNOSIS — I480 Paroxysmal atrial fibrillation: Secondary | ICD-10-CM | POA: Diagnosis not present

## 2023-08-22 DIAGNOSIS — I739 Peripheral vascular disease, unspecified: Secondary | ICD-10-CM | POA: Diagnosis not present

## 2023-08-22 DIAGNOSIS — Z952 Presence of prosthetic heart valve: Secondary | ICD-10-CM | POA: Diagnosis not present

## 2023-08-22 DIAGNOSIS — Z5181 Encounter for therapeutic drug level monitoring: Secondary | ICD-10-CM | POA: Diagnosis not present

## 2023-08-22 DIAGNOSIS — I743 Embolism and thrombosis of arteries of the lower extremities: Secondary | ICD-10-CM | POA: Diagnosis not present

## 2023-08-27 DIAGNOSIS — I081 Rheumatic disorders of both mitral and tricuspid valves: Secondary | ICD-10-CM | POA: Diagnosis not present

## 2023-08-27 DIAGNOSIS — I5020 Unspecified systolic (congestive) heart failure: Secondary | ICD-10-CM | POA: Diagnosis not present

## 2023-08-27 DIAGNOSIS — I272 Pulmonary hypertension, unspecified: Secondary | ICD-10-CM | POA: Diagnosis not present

## 2023-08-29 DIAGNOSIS — R918 Other nonspecific abnormal finding of lung field: Secondary | ICD-10-CM | POA: Diagnosis not present

## 2023-08-29 DIAGNOSIS — R051 Acute cough: Secondary | ICD-10-CM | POA: Diagnosis not present

## 2023-08-31 ENCOUNTER — Other Ambulatory Visit: Payer: Self-pay

## 2023-08-31 ENCOUNTER — Inpatient Hospital Stay (HOSPITAL_COMMUNITY)
Admission: EM | Admit: 2023-08-31 | Discharge: 2023-09-03 | DRG: 871 | Disposition: A | Discharge status: Other Acute Inpatient Hospital | Payer: Medicare Other | Attending: Family Medicine | Admitting: Family Medicine

## 2023-08-31 ENCOUNTER — Emergency Department (HOSPITAL_COMMUNITY): Payer: Medicare Other

## 2023-08-31 DIAGNOSIS — I4891 Unspecified atrial fibrillation: Secondary | ICD-10-CM | POA: Diagnosis present

## 2023-08-31 DIAGNOSIS — B954 Other streptococcus as the cause of diseases classified elsewhere: Secondary | ICD-10-CM | POA: Diagnosis not present

## 2023-08-31 DIAGNOSIS — R7881 Bacteremia: Secondary | ICD-10-CM | POA: Diagnosis not present

## 2023-08-31 DIAGNOSIS — E860 Dehydration: Secondary | ICD-10-CM | POA: Diagnosis present

## 2023-08-31 DIAGNOSIS — I5022 Chronic systolic (congestive) heart failure: Secondary | ICD-10-CM | POA: Diagnosis not present

## 2023-08-31 DIAGNOSIS — I700 Atherosclerosis of aorta: Secondary | ICD-10-CM | POA: Diagnosis not present

## 2023-08-31 DIAGNOSIS — R6521 Severe sepsis with septic shock: Secondary | ICD-10-CM | POA: Diagnosis not present

## 2023-08-31 DIAGNOSIS — Z91041 Radiographic dye allergy status: Secondary | ICD-10-CM

## 2023-08-31 DIAGNOSIS — R059 Cough, unspecified: Secondary | ICD-10-CM | POA: Diagnosis not present

## 2023-08-31 DIAGNOSIS — K820 Obstruction of gallbladder: Secondary | ICD-10-CM | POA: Diagnosis not present

## 2023-08-31 DIAGNOSIS — E861 Hypovolemia: Secondary | ICD-10-CM | POA: Diagnosis present

## 2023-08-31 DIAGNOSIS — A491 Streptococcal infection, unspecified site: Secondary | ICD-10-CM | POA: Diagnosis not present

## 2023-08-31 DIAGNOSIS — I739 Peripheral vascular disease, unspecified: Secondary | ICD-10-CM | POA: Diagnosis not present

## 2023-08-31 DIAGNOSIS — I082 Rheumatic disorders of both aortic and tricuspid valves: Secondary | ICD-10-CM | POA: Diagnosis not present

## 2023-08-31 DIAGNOSIS — Z83438 Family history of other disorder of lipoprotein metabolism and other lipidemia: Secondary | ICD-10-CM

## 2023-08-31 DIAGNOSIS — I509 Heart failure, unspecified: Secondary | ICD-10-CM

## 2023-08-31 DIAGNOSIS — I878 Other specified disorders of veins: Secondary | ICD-10-CM | POA: Diagnosis present

## 2023-08-31 DIAGNOSIS — E785 Hyperlipidemia, unspecified: Secondary | ICD-10-CM | POA: Diagnosis not present

## 2023-08-31 DIAGNOSIS — Z79899 Other long term (current) drug therapy: Secondary | ICD-10-CM

## 2023-08-31 DIAGNOSIS — N1831 Chronic kidney disease, stage 3a: Secondary | ICD-10-CM | POA: Diagnosis present

## 2023-08-31 DIAGNOSIS — Z743 Need for continuous supervision: Secondary | ICD-10-CM | POA: Diagnosis not present

## 2023-08-31 DIAGNOSIS — M109 Gout, unspecified: Secondary | ICD-10-CM | POA: Diagnosis present

## 2023-08-31 DIAGNOSIS — Z95820 Peripheral vascular angioplasty status with implants and grafts: Secondary | ICD-10-CM

## 2023-08-31 DIAGNOSIS — A408 Other streptococcal sepsis: Secondary | ICD-10-CM | POA: Diagnosis not present

## 2023-08-31 DIAGNOSIS — R0602 Shortness of breath: Secondary | ICD-10-CM | POA: Diagnosis not present

## 2023-08-31 DIAGNOSIS — R531 Weakness: Secondary | ICD-10-CM | POA: Diagnosis not present

## 2023-08-31 DIAGNOSIS — Z1152 Encounter for screening for COVID-19: Secondary | ICD-10-CM | POA: Diagnosis not present

## 2023-08-31 DIAGNOSIS — D689 Coagulation defect, unspecified: Secondary | ICD-10-CM | POA: Diagnosis not present

## 2023-08-31 DIAGNOSIS — Z8261 Family history of arthritis: Secondary | ICD-10-CM

## 2023-08-31 DIAGNOSIS — I110 Hypertensive heart disease with heart failure: Secondary | ICD-10-CM | POA: Diagnosis not present

## 2023-08-31 DIAGNOSIS — I9589 Other hypotension: Secondary | ICD-10-CM | POA: Diagnosis not present

## 2023-08-31 DIAGNOSIS — R7981 Abnormal blood-gas level: Secondary | ICD-10-CM | POA: Diagnosis not present

## 2023-08-31 DIAGNOSIS — Z95810 Presence of automatic (implantable) cardiac defibrillator: Secondary | ICD-10-CM | POA: Diagnosis not present

## 2023-08-31 DIAGNOSIS — K828 Other specified diseases of gallbladder: Secondary | ICD-10-CM | POA: Diagnosis not present

## 2023-08-31 DIAGNOSIS — Z952 Presence of prosthetic heart valve: Secondary | ICD-10-CM | POA: Diagnosis not present

## 2023-08-31 DIAGNOSIS — B958 Unspecified staphylococcus as the cause of diseases classified elsewhere: Secondary | ICD-10-CM | POA: Diagnosis not present

## 2023-08-31 DIAGNOSIS — R911 Solitary pulmonary nodule: Secondary | ICD-10-CM | POA: Diagnosis not present

## 2023-08-31 DIAGNOSIS — Z800 Family history of malignant neoplasm of digestive organs: Secondary | ICD-10-CM

## 2023-08-31 DIAGNOSIS — J189 Pneumonia, unspecified organism: Secondary | ICD-10-CM | POA: Diagnosis not present

## 2023-08-31 DIAGNOSIS — A419 Sepsis, unspecified organism: Secondary | ICD-10-CM | POA: Diagnosis not present

## 2023-08-31 DIAGNOSIS — I517 Cardiomegaly: Secondary | ICD-10-CM | POA: Diagnosis not present

## 2023-08-31 DIAGNOSIS — I429 Cardiomyopathy, unspecified: Secondary | ICD-10-CM | POA: Diagnosis not present

## 2023-08-31 DIAGNOSIS — I959 Hypotension, unspecified: Secondary | ICD-10-CM | POA: Diagnosis present

## 2023-08-31 DIAGNOSIS — I339 Acute and subacute endocarditis, unspecified: Secondary | ICD-10-CM | POA: Diagnosis present

## 2023-08-31 DIAGNOSIS — Z803 Family history of malignant neoplasm of breast: Secondary | ICD-10-CM

## 2023-08-31 DIAGNOSIS — B955 Unspecified streptococcus as the cause of diseases classified elsewhere: Secondary | ICD-10-CM | POA: Diagnosis present

## 2023-08-31 DIAGNOSIS — R230 Cyanosis: Secondary | ICD-10-CM | POA: Diagnosis present

## 2023-08-31 DIAGNOSIS — R0902 Hypoxemia: Secondary | ICD-10-CM | POA: Diagnosis not present

## 2023-08-31 DIAGNOSIS — N179 Acute kidney failure, unspecified: Secondary | ICD-10-CM | POA: Diagnosis present

## 2023-08-31 DIAGNOSIS — I130 Hypertensive heart and chronic kidney disease with heart failure and stage 1 through stage 4 chronic kidney disease, or unspecified chronic kidney disease: Secondary | ICD-10-CM | POA: Diagnosis not present

## 2023-08-31 DIAGNOSIS — I7121 Aneurysm of the ascending aorta, without rupture: Secondary | ICD-10-CM | POA: Diagnosis not present

## 2023-08-31 DIAGNOSIS — I480 Paroxysmal atrial fibrillation: Secondary | ICD-10-CM | POA: Diagnosis not present

## 2023-08-31 DIAGNOSIS — R5383 Other fatigue: Secondary | ICD-10-CM | POA: Diagnosis not present

## 2023-08-31 DIAGNOSIS — K769 Liver disease, unspecified: Secondary | ICD-10-CM | POA: Diagnosis present

## 2023-08-31 DIAGNOSIS — J9601 Acute respiratory failure with hypoxia: Secondary | ICD-10-CM | POA: Diagnosis not present

## 2023-08-31 DIAGNOSIS — Z7901 Long term (current) use of anticoagulants: Secondary | ICD-10-CM

## 2023-08-31 DIAGNOSIS — Z8249 Family history of ischemic heart disease and other diseases of the circulatory system: Secondary | ICD-10-CM

## 2023-08-31 DIAGNOSIS — E871 Hypo-osmolality and hyponatremia: Secondary | ICD-10-CM | POA: Diagnosis not present

## 2023-08-31 DIAGNOSIS — T827XXA Infection and inflammatory reaction due to other cardiac and vascular devices, implants and grafts, initial encounter: Secondary | ICD-10-CM | POA: Diagnosis not present

## 2023-08-31 DIAGNOSIS — R6889 Other general symptoms and signs: Secondary | ICD-10-CM | POA: Diagnosis not present

## 2023-08-31 DIAGNOSIS — I714 Abdominal aortic aneurysm, without rupture, unspecified: Secondary | ICD-10-CM | POA: Diagnosis not present

## 2023-08-31 DIAGNOSIS — I330 Acute and subacute infective endocarditis: Secondary | ICD-10-CM | POA: Diagnosis not present

## 2023-08-31 DIAGNOSIS — R7989 Other specified abnormal findings of blood chemistry: Secondary | ICD-10-CM | POA: Diagnosis not present

## 2023-08-31 DIAGNOSIS — T826XXA Infection and inflammatory reaction due to cardiac valve prosthesis, initial encounter: Secondary | ICD-10-CM | POA: Diagnosis not present

## 2023-08-31 DIAGNOSIS — R509 Fever, unspecified: Secondary | ICD-10-CM | POA: Diagnosis not present

## 2023-08-31 LAB — PROTIME-INR
INR: 3.1 — ABNORMAL HIGH (ref 0.8–1.2)
INR: 3.5 — ABNORMAL HIGH (ref 0.8–1.2)
Prothrombin Time: 31.8 s — ABNORMAL HIGH (ref 11.4–15.2)
Prothrombin Time: 35.3 s — ABNORMAL HIGH (ref 11.4–15.2)

## 2023-08-31 LAB — COMPREHENSIVE METABOLIC PANEL
ALT: 65 U/L — ABNORMAL HIGH (ref 0–44)
AST: 84 U/L — ABNORMAL HIGH (ref 15–41)
Albumin: 3.2 g/dL — ABNORMAL LOW (ref 3.5–5.0)
Alkaline Phosphatase: 100 U/L (ref 38–126)
Anion gap: 6 (ref 5–15)
BUN: 36 mg/dL — ABNORMAL HIGH (ref 8–23)
CO2: 20 mmol/L — ABNORMAL LOW (ref 22–32)
Calcium: 8.3 mg/dL — ABNORMAL LOW (ref 8.9–10.3)
Chloride: 101 mmol/L (ref 98–111)
Creatinine, Ser: 1.34 mg/dL — ABNORMAL HIGH (ref 0.61–1.24)
GFR, Estimated: 57 mL/min — ABNORMAL LOW (ref >60)
Glucose, Bld: 138 mg/dL — ABNORMAL HIGH (ref 70–99)
Potassium: 4.7 mmol/L (ref 3.5–5.1)
Sodium: 127 mmol/L — ABNORMAL LOW (ref 135–145)
Total Bilirubin: 1.3 mg/dL — ABNORMAL HIGH (ref 0.0–1.2)
Total Protein: 7.4 g/dL (ref 6.5–8.1)

## 2023-08-31 LAB — CBC WITH DIFFERENTIAL/PLATELET
Abs Immature Granulocytes: 0.05 10*3/uL (ref 0.00–0.07)
Basophils Absolute: 0 10*3/uL (ref 0.0–0.1)
Basophils Relative: 0 %
Eosinophils Absolute: 0.1 10*3/uL (ref 0.0–0.5)
Eosinophils Relative: 0 %
HCT: 33.9 % — ABNORMAL LOW (ref 39.0–52.0)
Hemoglobin: 11.3 g/dL — ABNORMAL LOW (ref 13.0–17.0)
Immature Granulocytes: 0 %
Lymphocytes Relative: 3 %
Lymphs Abs: 0.4 10*3/uL — ABNORMAL LOW (ref 0.7–4.0)
MCH: 32.5 pg (ref 26.0–34.0)
MCHC: 33.3 g/dL (ref 30.0–36.0)
MCV: 97.4 fL (ref 80.0–100.0)
Monocytes Absolute: 1.2 10*3/uL — ABNORMAL HIGH (ref 0.1–1.0)
Monocytes Relative: 8 %
Neutro Abs: 12.1 10*3/uL — ABNORMAL HIGH (ref 1.7–7.7)
Neutrophils Relative %: 89 %
Platelets: 138 10*3/uL — ABNORMAL LOW (ref 150–400)
RBC: 3.48 MIL/uL — ABNORMAL LOW (ref 4.22–5.81)
RDW: 14.8 % (ref 11.5–15.5)
WBC: 13.7 10*3/uL — ABNORMAL HIGH (ref 4.0–10.5)
nRBC: 0 % (ref 0.0–0.2)

## 2023-08-31 LAB — LACTIC ACID, PLASMA
Lactic Acid, Venous: 1.1 mmol/L (ref 0.5–1.9)
Lactic Acid, Venous: 1.8 mmol/L (ref 0.5–1.9)

## 2023-08-31 LAB — BRAIN NATRIURETIC PEPTIDE: B Natriuretic Peptide: 786 pg/mL — ABNORMAL HIGH (ref 0.0–100.0)

## 2023-08-31 LAB — RESP PANEL BY RT-PCR (RSV, FLU A&B, COVID)  RVPGX2
Influenza A by PCR: NEGATIVE
Influenza B by PCR: NEGATIVE
Resp Syncytial Virus by PCR: NEGATIVE
SARS Coronavirus 2 by RT PCR: NEGATIVE

## 2023-08-31 MED ORDER — SODIUM CHLORIDE 0.9% FLUSH
3.0000 mL | Freq: Two times a day (BID) | INTRAVENOUS | Status: DC
  Administered 2023-09-01 – 2023-09-03 (×5): 3 mL via INTRAVENOUS

## 2023-08-31 MED ORDER — SODIUM CHLORIDE 0.9 % IV SOLN
1.0000 g | Freq: Once | INTRAVENOUS | Status: AC
  Administered 2023-08-31: 1 g via INTRAVENOUS
  Filled 2023-08-31: qty 10

## 2023-08-31 MED ORDER — ACETAMINOPHEN 500 MG PO TABS
1000.0000 mg | ORAL_TABLET | Freq: Once | ORAL | Status: AC
  Administered 2023-08-31: 1000 mg via ORAL
  Filled 2023-08-31: qty 2

## 2023-08-31 MED ORDER — VANCOMYCIN VARIABLE DOSE PER UNSTABLE RENAL FUNCTION (PHARMACIST DOSING)
Status: DC
Start: 2023-08-31 — End: 2023-09-01

## 2023-08-31 MED ORDER — AZITHROMYCIN 250 MG PO TABS: 500.0000 mg | ORAL_TABLET | Freq: Every day | ORAL | Status: DC

## 2023-08-31 MED ORDER — SODIUM CHLORIDE 0.9 % IV BOLUS
1000.0000 mL | Freq: Once | INTRAVENOUS | Status: AC
  Administered 2023-08-31: 1000 mL via INTRAVENOUS

## 2023-08-31 MED ORDER — SODIUM CHLORIDE 0.9 % IV SOLN
2.0000 g | Freq: Two times a day (BID) | INTRAVENOUS | Status: DC
  Administered 2023-08-31 – 2023-09-01 (×3): 2 g via INTRAVENOUS
  Filled 2023-08-31 (×4): qty 12.5

## 2023-08-31 MED ORDER — ALBUTEROL SULFATE (2.5 MG/3ML) 0.083% IN NEBU
2.5000 mg | INHALATION_SOLUTION | RESPIRATORY_TRACT | Status: DC | PRN
  Administered 2023-09-01: 2.5 mg via RESPIRATORY_TRACT
  Filled 2023-08-31: qty 3

## 2023-08-31 MED ORDER — WARFARIN SODIUM 2.5 MG PO TABS
3.7500 mg | ORAL_TABLET | Freq: Once | ORAL | Status: AC
  Administered 2023-09-01: 3.75 mg via ORAL
  Filled 2023-08-31: qty 1

## 2023-08-31 MED ORDER — ATORVASTATIN CALCIUM 20 MG PO TABS
20.0000 mg | ORAL_TABLET | Freq: Every day | ORAL | Status: DC
Start: 2023-09-01 — End: 2023-09-03
  Administered 2023-09-01 – 2023-09-03 (×3): 20 mg via ORAL
  Filled 2023-08-31: qty 1
  Filled 2023-08-31: qty 2
  Filled 2023-08-31: qty 1

## 2023-08-31 MED ORDER — SODIUM CHLORIDE 0.9 % IV SOLN: 1.0000 g | INTRAVENOUS | Status: DC

## 2023-08-31 MED ORDER — WARFARIN - PHARMACIST DOSING INPATIENT: Freq: Every day | Status: DC

## 2023-08-31 MED ORDER — SODIUM CHLORIDE 0.9 % IV SOLN: 2.0000 g | Freq: Three times a day (TID) | INTRAVENOUS | Status: DC

## 2023-08-31 MED ORDER — VANCOMYCIN HCL 1500 MG/300ML IV SOLN
1500.0000 mg | Freq: Once | INTRAVENOUS | Status: AC
  Administered 2023-08-31: 1500 mg via INTRAVENOUS
  Filled 2023-08-31: qty 300

## 2023-08-31 MED ORDER — METOPROLOL SUCCINATE ER 50 MG PO TB24: 100.0000 mg | ORAL_TABLET | Freq: Every day | ORAL | Status: DC

## 2023-08-31 MED ORDER — EZETIMIBE 10 MG PO TABS
10.0000 mg | ORAL_TABLET | Freq: Every day | ORAL | Status: DC
  Administered 2023-09-01 – 2023-09-03 (×3): 10 mg via ORAL
  Filled 2023-08-31 (×3): qty 1

## 2023-08-31 MED ORDER — SODIUM CHLORIDE 0.9 % IV SOLN
500.0000 mg | Freq: Once | INTRAVENOUS | Status: AC
  Administered 2023-08-31: 500 mg via INTRAVENOUS
  Filled 2023-08-31: qty 5

## 2023-08-31 MED ORDER — AMIODARONE HCL 200 MG PO TABS
200.0000 mg | ORAL_TABLET | Freq: Every day | ORAL | Status: DC
  Administered 2023-09-01 – 2023-09-03 (×3): 200 mg via ORAL
  Filled 2023-08-31 (×3): qty 1

## 2023-08-31 MED ORDER — LACTATED RINGERS IV BOLUS: 500.0000 mL | Freq: Once | INTRAVENOUS | Status: DC

## 2023-08-31 MED ORDER — WARFARIN SODIUM 2.5 MG PO TABS
3.7500 mg | ORAL_TABLET | Freq: Every day | ORAL | Status: DC
  Filled 2023-08-31: qty 1

## 2023-08-31 NOTE — Progress Notes (Signed)
PHARMACY NOTE:  ANTIMICROBIAL RENAL DOSAGE ADJUSTMENT  Current antimicrobial regimen includes a mismatch between antimicrobial dosage and estimated renal function.  As per policy approved by the Pharmacy & Therapeutics and Medical Executive Committees, the antimicrobial dosage will be adjusted accordingly.  Current antimicrobial dosage:  Cefepime 2gm IV q8h  Indication: sepsis  Renal Function:  Estimated Creatinine Clearance: 45.6 mL/min (A) (by C-G formula based on SCr of 1.34 mg/dL (H)).      Antimicrobial dosage has been changed to:  Cefepime 2gm IV q12h  Additional comments:   Thank you for allowing pharmacy to be a part of this patient's care.  Maryellen Pile, [facility] 08/31/2023 10:05 PM

## 2023-08-31 NOTE — Progress Notes (Signed)
Pharmacy Antibiotic Note  Edward Marquez is a 73 y.o. male admitted on 08/31/2023 with sepsis. Patient presenting with generalized weakness and cough. PMH significant for  hyperlipidemia, atrial fibrillation, hx of mechanical aortic valve, PAD, and PVD. In ED, patient is febrile to 102.2, WBC 13.7, LA 1.1. Pharmacy has been consulted for vancomycin dosing.  Plan: Give vancomycin load of 1500 mg IV x1 Dose vancomycin per levels given AKI on admission (Scr 1.34, BL ~0.7-0.9) F/u Scr in AM for further scheduled vancomycin doses Monitor renal function, clinical status, culture data, and LOT    Temp (24hrs), Avg:102.2 F (39 C), Min:102.2 F (39 C), Max:102.2 F (39 C)  Recent Labs  Lab 08/31/23 1828 08/31/23 2006  WBC 13.7*  --   CREATININE 1.34*  --   LATICACIDVEN  --  1.1    CrCl cannot be calculated (Unknown ideal weight.).    Allergies  Allergen Reactions   Ivp Dye [Iodinated Contrast Media] Hives   Antimicrobials this admission: cefepime 1/18 >>  azithromycin 1/18 >>  Vancomycin 1/18 >>  Dose adjustments this admission: N/A  Microbiology results: 1/18 BCx: pending  Thank you for involving pharmacy in this patient's care.   Rockwell Alexandria, PharmD Clinical Pharmacist 08/31/2023 9:37 PM

## 2023-08-31 NOTE — Progress Notes (Signed)
PHARMACY - ANTICOAGULATION CONSULT NOTE  Pharmacy Consult for PTA warfarin restart Indication: atrial fibrillation, AVR s/p mechanical aortic valve  Allergies  Allergen Reactions   Ivp Dye [Iodinated Contrast Media] Hives    Patient Measurements: Height: 5\' 6"  (167.6 cm) Weight: 72.6 kg (160 lb) IBW/kg (Calculated) : 63.8  Vital Signs: Temp: 97.7 F (36.5 C) (01/18 2200) Temp Source: Oral (01/18 2200) BP: 96/58 (01/18 2215) Pulse Rate: 60 (01/18 2215)  Labs: Recent Labs    08/31/23 1828  HGB 11.3*  HCT 33.9*  PLT 138*  LABPROT 31.8*  INR 3.1*  CREATININE 1.34*    Estimated Creatinine Clearance: 45.6 mL/min (A) (by C-G formula based on SCr of 1.34 mg/dL (H)).   Medical History: Past Medical History:  Diagnosis Date   Aortic valve defect    Atrial fibrillation (HCC)    CHF (congestive heart failure) (HCC)    Clotting disorder (HCC)    Gout    Hypertension    Peripheral vascular disease (HCC)    Assessment: 23 yoM presented to ED with sepsis. PMH includes hyperlipidemia, atrial fibrillation, hx of mechanical aortic valve, PAD, and PVD. Pharmacy consulted to dose warfarin for mechanical aortic valve.  -Hgb 11.3, plts 138 -INR 3.1  -PTA warfarin 3.25 mg (1/2 7.5 mg tabs) PO daily (Weekly mg 26.25 per last anticoagulant note on 1/9)  Goal of Therapy:  INR 2.5-3.5 Monitor platelets by anticoagulation protocol: Yes   Plan:  Warfarin 3.75mg  PO x1 Daily INR and CBC  Arabella Merles, PharmD. Clinical Pharmacist 08/31/2023 10:50 PM

## 2023-08-31 NOTE — ED Triage Notes (Signed)
Pt arrived via [facility] from home c/o cough, generalized weakness, shob x 2 weeks that has gotten worse, pt seen provider last week and was given tessalon pearls for cough. Pt has a fever at this time of 102.2 F.

## 2023-08-31 NOTE — ED Provider Notes (Cosign Needed Addendum)
[employer] EMERGENCY DEPARTMENT AT [facility] Provider Note   CSN: [id number] Arrival date & time: 08/31/23  1751     History Chief Complaint  Patient presents with   Cough    Edward Marquez is a 73 y.o. male.  Patient with past history significant for hyperlipidemia, atrial fibrillation, PAD, PVD presents to the ER with concerns of a cough.  States that his had a cough and generalized weakness all some shortness of breath for the last 2 weeks.  States this been progressively worsening.  States that he was seen by PCP recently & prescribed Tessalon Perles.  After arrival, patient was noted to have a fever.  Denies any recent chest pain, worsening leg swelling, weight gain, nausea, vomiting, or diarrhea.   Cough      Home Medications Prior to Admission medications   Medication Sig Start Date End Date Taking? Authorizing Provider  amiodarone (PACERONE) 200 MG tablet Take 200 mg by mouth daily.    [provider]  atorvastatin (LIPITOR) 20 MG tablet TAKE 1 TABLET BY MOUTH EVERY DAY 07/27/22   Gilmore Laroche, FNP  ibuprofen (ADVIL,MOTRIN) 200 MG tablet Take 400 mg every 6 (six) hours as needed by mouth for headache or moderate pain.    [provider]  metoprolol succinate (TOPROL-XL) 100 MG 24 hr tablet Take 100 mg by mouth daily. Take with or immediately following a meal.    [provider]  ramipril (ALTACE) 2.5 MG capsule Take 2.5 mg by mouth daily.    [provider]  warfarin (COUMADIN) 7.5 MG tablet Take 3.75-7.5 mg by mouth See admin instructions. Six days a week 3.75 mg then on Tuesdays 7 mg    [provider]      Allergies    Ivp dye [iodinated contrast media]    Review of Systems   Review of Systems  Respiratory:  Positive for cough.   All other systems reviewed and are negative.   Physical Exam Updated Vital Signs BP 99/63   Pulse 68   Temp (!) 102.2 F (39 C) (Oral)   Resp 16   SpO2 97%  Physical  Exam Vitals and nursing note reviewed.  Constitutional:      General: He is not in acute distress.    Appearance: He is well-developed.  HENT:     Head: Normocephalic and atraumatic.  Eyes:     Conjunctiva/sclera: Conjunctivae normal.  Cardiovascular:     Rate and Rhythm: Normal rate and regular rhythm.     Heart sounds: No murmur heard. Pulmonary:     Effort: Pulmonary effort is normal. No respiratory distress.     Breath sounds: No wheezing, rhonchi or rales.  Abdominal:     Palpations: Abdomen is soft.     Tenderness: There is no abdominal tenderness.  Musculoskeletal:        General: No swelling.     Cervical back: Neck supple.     Right lower leg: 2+ Edema present.     Left lower leg: 2+ Edema present.  Skin:    General: Skin is warm and dry.     Capillary Refill: Capillary refill takes less than 2 seconds.  Neurological:     Mental Status: He is alert.  Psychiatric:        Mood and Affect: Mood normal.     ED Results / Procedures / Treatments   Labs (all labs ordered are listed, but only abnormal results are displayed) Labs Reviewed  CBC  WITH DIFFERENTIAL/PLATELET - Abnormal; Notable for the following components:      Result Value   WBC 13.7 (*)    RBC 3.48 (*)    Hemoglobin 11.3 (*)    HCT 33.9 (*)    Platelets 138 (*)    Neutro Abs 12.1 (*)    Lymphs Abs 0.4 (*)    Monocytes Absolute 1.2 (*)    All other components within normal limits  COMPREHENSIVE METABOLIC PANEL - Abnormal; Notable for the following components:   Sodium 127 (*)    CO2 20 (*)    Glucose, Bld 138 (*)    BUN 36 (*)    Creatinine, Ser 1.34 (*)    Calcium 8.3 (*)    Albumin 3.2 (*)    AST 84 (*)    ALT 65 (*)    Total Bilirubin 1.3 (*)    GFR, Estimated 57 (*)    All other components within normal limits  BRAIN NATRIURETIC PEPTIDE - Abnormal; Notable for the following components:   B Natriuretic Peptide 786.0 (*)    All other components within normal limits  RESP PANEL BY RT-PCR  (RSV, FLU A&B, COVID)  RVPGX2  CULTURE, BLOOD (ROUTINE X 2)  CULTURE, BLOOD (ROUTINE X 2)  LACTIC ACID, PLASMA  LACTIC ACID, PLASMA    EKG None  Radiology DG Chest Port 1 View Result Date: 08/31/2023 CLINICAL DATA:  Cough, generalized weakness, short of breath for 2 weeks EXAM: PORTABLE CHEST 1 VIEW COMPARISON:  08/29/2023 FINDINGS: Single frontal view of the chest demonstrates stable multi lead pacer/AICD. Postsurgical changes from median sternotomy and aortic valve replacement. The cardiac silhouette remains enlarged. No acute airspace disease, effusion, or pneumothorax. Prior healed right rib fractures. No acute bony abnormalities. IMPRESSION: 1. Stable chest, no acute process. Electronically Signed   By: Sharlet Salina M.D.   On: 08/31/2023 18:43    Procedures Procedures    Medications Ordered in ED Medications  azithromycin (ZITHROMAX) 500 mg in sodium chloride 0.9 % 250 mL IVPB (500 mg Intravenous New Bag/Given 08/31/23 2020)  acetaminophen (TYLENOL) tablet 1,000 mg (1,000 mg Oral Given 08/31/23 1810)  sodium chloride 0.9 % bolus 1,000 mL (0 mLs Intravenous Stopped 08/31/23 2020)  cefTRIAXone (ROCEPHIN) 1 g in sodium chloride 0.9 % 100 mL IVPB (1 g Intravenous New Bag/Given 08/31/23 2017)    ED Course/ Medical Decision Making/ A&P                                 Medical Decision Making Amount and/or Complexity of Data Reviewed Labs: ordered. Radiology: ordered.  Risk OTC drugs. Decision regarding hospitalization.   This patient presents to the ED for concern of cough.  Differential diagnosis includes COVID-19, influenza, pneumonia, CHF    Lab Tests:  I Ordered, and personally interpreted labs.  The pertinent results include: CBC with leukocytosis of 13.7, CMP with mild dehydration with hyponatremia 127 and elevation in creatinine at 1.34, BNP slightly elevated at 786, lactic acid collected and pending, blood culture collected and pending   Imaging Studies  ordered:  I ordered imaging studies including chest x-ray I independently visualized and interpreted imaging which showed no acute cardiopulmonary process I agree with the radiologist interpretation   Medicines ordered and prescription drug management:  I ordered medication including fluids, Tylenol, Zithromax, Rocephin for dehydration, fever, CAP Reevaluation of the patient after these medicines showed that the patient stayed the same I have  reviewed the patients home medicines and have made adjustments as needed   Problem List / ED Course:  Patient with history significant for hyperlipidemia, atrial fibrillation, PAD, PVD, prior aortic valve replacement, ICD presents to the emergency department concerns of a cough.  States that he has been having a productive cough for about 2 weeks.  He has had follow-up with his primary care provider with a negative chest x-ray although states that his cough is productive and he has not had any notable improvement.  No sick contacts as far as patient is aware.  He denies any notable fever or chills at home. On exam, patient is presently well-appearing although he does appear to be borderline hypotensive and is noted to have a fever at 102.2 F.  I doubt patient is in septic shock given otherwise well appearance, but I do suspect he may have viral URI or possible pneumonia.  Exam is otherwise unremarkable with no focal abnormal lung sounds.  He denies any abdominal tenderness.  Legs do have some mild 2+ edema present which is close to patient's baseline per his report.  As stated before, I primary concern given patient's history is possible viral URI versus pneumonia.  Code sepsis not initiated at this time for patient. Patient's lab workup is largely reassuring although does appear that he is acutely dehydrated given his notable hyponatremia.  He does also have some leukocytosis which is probably secondary to infection.  He is negative for COVID-19, influenza  and RSV.  His chest x-ray is also surprisingly negative.  However, I suspect patient does have pneumonia given prolonged course of his productive cough with discolored sputum.  Will initiate treatment with antibiotic therapy for suspected community-acquired pneumonia. Informed patient and significant other of findings.  Given these concerns, I believe the patient would likely benefit from admission.  Will discuss patient care with hospitalist. Spoke with Dr. Lazarus Salines, hospitalist, who will be admitting patient.   Final Clinical Impression(s) / ED Diagnoses Final diagnoses:  Community acquired pneumonia, unspecified laterality  Dehydration  Hyponatremia  Acute on chronic congestive heart failure, unspecified heart failure type [facility])    Rx / DC Orders ED Discharge Orders     None         Smitty Knudsen, PA-C 08/31/23 2054    Smitty Knudsen, PA-C 08/31/23 2055    Bethann Berkshire, MD 09/02/23 1112

## 2023-09-01 ENCOUNTER — Encounter (HOSPITAL_COMMUNITY): Payer: Self-pay | Admitting: Internal Medicine

## 2023-09-01 ENCOUNTER — Inpatient Hospital Stay (HOSPITAL_COMMUNITY): Payer: Medicare Other

## 2023-09-01 DIAGNOSIS — N1831 Chronic kidney disease, stage 3a: Secondary | ICD-10-CM | POA: Diagnosis present

## 2023-09-01 DIAGNOSIS — R7989 Other specified abnormal findings of blood chemistry: Secondary | ICD-10-CM

## 2023-09-01 DIAGNOSIS — R7881 Bacteremia: Secondary | ICD-10-CM | POA: Diagnosis not present

## 2023-09-01 DIAGNOSIS — J189 Pneumonia, unspecified organism: Secondary | ICD-10-CM | POA: Diagnosis not present

## 2023-09-01 LAB — BASIC METABOLIC PANEL
Anion gap: 9 (ref 5–15)
BUN: 41 mg/dL — ABNORMAL HIGH (ref 8–23)
CO2: 19 mmol/L — ABNORMAL LOW (ref 22–32)
Calcium: 7.7 mg/dL — ABNORMAL LOW (ref 8.9–10.3)
Chloride: 103 mmol/L (ref 98–111)
Creatinine, Ser: 1.49 mg/dL — ABNORMAL HIGH (ref 0.61–1.24)
GFR, Estimated: 50 mL/min — ABNORMAL LOW (ref >60)
Glucose, Bld: 226 mg/dL — ABNORMAL HIGH (ref 70–99)
Potassium: 4.6 mmol/L (ref 3.5–5.1)
Sodium: 131 mmol/L — ABNORMAL LOW (ref 135–145)

## 2023-09-01 LAB — RESPIRATORY PANEL BY PCR

## 2023-09-01 LAB — CBC
HCT: 32.6 % — ABNORMAL LOW (ref 39.0–52.0)
Hemoglobin: 10.6 g/dL — ABNORMAL LOW (ref 13.0–17.0)
MCH: 32.1 pg (ref 26.0–34.0)
MCHC: 32.5 g/dL (ref 30.0–36.0)
MCV: 98.8 fL (ref 80.0–100.0)
Platelets: 111 10*3/uL — ABNORMAL LOW (ref 150–400)
RBC: 3.3 MIL/uL — ABNORMAL LOW (ref 4.22–5.81)
RDW: 14.7 % (ref 11.5–15.5)
WBC: 16.1 10*3/uL — ABNORMAL HIGH (ref 4.0–10.5)
nRBC: 0 % (ref 0.0–0.2)

## 2023-09-01 LAB — MRSA NEXT GEN BY PCR, NASAL: MRSA by PCR Next Gen: NOT DETECTED

## 2023-09-01 LAB — URINALYSIS, ROUTINE W REFLEX MICROSCOPIC
Bilirubin Urine: NEGATIVE
Glucose, UA: 50 mg/dL — AB
Hgb urine dipstick: NEGATIVE
Ketones, ur: NEGATIVE mg/dL
Leukocytes,Ua: NEGATIVE
Nitrite: NEGATIVE
Protein, ur: NEGATIVE mg/dL
Specific Gravity, Urine: 1.015 (ref 1.005–1.030)
pH: 5 (ref 5.0–8.0)

## 2023-09-01 LAB — PROTIME-INR
INR: 3.9 — ABNORMAL HIGH (ref 0.8–1.2)
Prothrombin Time: 38.1 s — ABNORMAL HIGH (ref 11.4–15.2)

## 2023-09-01 LAB — PHOSPHORUS: Phosphorus: 2.6 mg/dL (ref 2.5–4.6)

## 2023-09-01 LAB — PROCALCITONIN: Procalcitonin: 0.7 ng/mL

## 2023-09-01 LAB — MAGNESIUM: Magnesium: 2.3 mg/dL (ref 1.7–2.4)

## 2023-09-01 MED ORDER — VITAMIN D (ERGOCALCIFEROL) 1.25 MG (50000 UNIT) PO CAPS
50000.0000 [IU] | ORAL_CAPSULE | ORAL | Status: DC
Start: 2023-09-03 — End: 2023-09-03
  Administered 2023-09-03: 50000 [IU] via ORAL
  Filled 2023-09-01: qty 1

## 2023-09-01 MED ORDER — SPIRONOLACTONE 25 MG PO TABS
25.0000 mg | ORAL_TABLET | Freq: Every day | ORAL | Status: DC
  Administered 2023-09-01 – 2023-09-02 (×2): 25 mg via ORAL
  Filled 2023-09-01 (×2): qty 1

## 2023-09-01 MED ORDER — LORATADINE 10 MG PO TABS
10.0000 mg | ORAL_TABLET | Freq: Every day | ORAL | Status: DC
  Administered 2023-09-02 – 2023-09-03 (×2): 10 mg via ORAL
  Filled 2023-09-01 (×2): qty 1

## 2023-09-01 MED ORDER — DOXYCYCLINE HYCLATE 100 MG PO TABS
100.0000 mg | ORAL_TABLET | Freq: Two times a day (BID) | ORAL | Status: DC
  Administered 2023-09-01 – 2023-09-03 (×5): 100 mg via ORAL
  Filled 2023-09-01 (×5): qty 1

## 2023-09-01 MED ORDER — MIDODRINE HCL 5 MG PO TABS
5.0000 mg | ORAL_TABLET | Freq: Three times a day (TID) | ORAL | Status: DC
  Administered 2023-09-01 – 2023-09-03 (×8): 5 mg via ORAL
  Filled 2023-09-01 (×8): qty 1

## 2023-09-01 MED ORDER — SODIUM CHLORIDE 0.9 % IV SOLN: INTRAVENOUS | Status: DC

## 2023-09-01 MED ORDER — ALLOPURINOL 100 MG PO TABS
200.0000 mg | ORAL_TABLET | Freq: Every day | ORAL | Status: DC
  Administered 2023-09-01 – 2023-09-03 (×3): 200 mg via ORAL
  Filled 2023-09-01 (×4): qty 2

## 2023-09-01 MED ORDER — LEVOCETIRIZINE DIHYDROCHLORIDE 5 MG PO TABS: 2.5000 mg | ORAL_TABLET | Freq: Every day | ORAL | Status: DC

## 2023-09-01 MED ORDER — FUROSEMIDE 20 MG PO TABS
20.0000 mg | ORAL_TABLET | Freq: Every day | ORAL | Status: DC
  Administered 2023-09-01 – 2023-09-03 (×3): 20 mg via ORAL
  Filled 2023-09-01 (×3): qty 1

## 2023-09-01 MED ORDER — ACETAMINOPHEN 10 MG/ML IV SOLN
1000.0000 mg | Freq: Once | INTRAVENOUS | Status: AC
  Administered 2023-09-01: 1000 mg via INTRAVENOUS
  Filled 2023-09-01: qty 100

## 2023-09-01 MED ORDER — METOPROLOL SUCCINATE ER 25 MG PO TB24
12.5000 mg | ORAL_TABLET | Freq: Every day | ORAL | Status: DC
  Administered 2023-09-02 – 2023-09-03 (×2): 12.5 mg via ORAL
  Filled 2023-09-01 (×2): qty 1

## 2023-09-01 MED ORDER — VANCOMYCIN HCL IN DEXTROSE 1-5 GM/200ML-% IV SOLN: 1000.0000 mg | INTRAVENOUS | Status: DC

## 2023-09-01 MED ORDER — MORPHINE SULFATE (PF) 2 MG/ML IV SOLN
2.0000 mg | Freq: Once | INTRAVENOUS | Status: AC
Start: 2023-09-01 — End: 2023-09-01
  Administered 2023-09-01: 2 mg via INTRAVENOUS
  Filled 2023-09-01: qty 1

## 2023-09-01 MED ORDER — IPRATROPIUM-ALBUTEROL 0.5-2.5 (3) MG/3ML IN SOLN
RESPIRATORY_TRACT | Status: AC
  Administered 2023-09-01: 3 mL
  Filled 2023-09-01: qty 3

## 2023-09-01 NOTE — Progress Notes (Signed)
Date and time results received: 09/01/23 1119   Test: Blood Culture Critical Value: Gram (+) cocci in anaerobic bottle  Name of Provider Notified: Laural Benes  Orders Received? Or Actions Taken?: awaiting response

## 2023-09-01 NOTE — ED Notes (Signed)
US at bedside

## 2023-09-01 NOTE — Hospital Course (Addendum)
73 y.o. 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  Fianc checked his blood pressure at home it was 60 systolic prompted them to contact EMS.  They report since New Year's he has had ongoing cough with prolonged coughing spells.  Nonproductive.  Does have associated shortness of breath, rhinorrhea.  He has had decreased p.o. intake over the past few weeks.  Having progressive deconditioning and generalized weakness. Also with chills and episodes of rigors. Had CXR recently as OP with streaky opacity LLL. Not on abx outpatient.

## 2023-09-01 NOTE — Progress Notes (Signed)
Pharmacy Antibiotic Note  Edward Marquez is a 73 y.o. male admitted on 08/31/2023 with sepsis. Patient presenting with generalized weakness and cough. PMH significant for  hyperlipidemia, atrial fibrillation, hx of mechanical aortic valve, PAD, and PVD. In ED, patient is febrile to 102.2, WBC now up to 16, LA 1.8. Pharmacy has been consulted for vancomycin and cefepime dosing.  Vancomycin 1000 mg IV Q 24 hrs. Goal AUC 400-550. Expected AUC: 495 SCr used: 1.49   Plan: Will continue with vancomycin 1g q24 hours and follow renal function closely Monitor renal function, clinical status, culture data, and LOT Height: 5\' 6"  (167.6 cm) Weight: 72.6 kg (160 lb) IBW/kg (Calculated) : 63.8  Temp (24hrs), Avg:99.6 F (37.6 C), Min:97.5 F (36.4 C), Max:102.2 F (39 C)  Recent Labs  Lab 08/31/23 1828 08/31/23 2006 08/31/23 2239 09/01/23 0547  WBC 13.7*  --   --  16.1*  CREATININE 1.34*  --   --  1.49*  LATICACIDVEN  --  1.1 1.8  --     Estimated Creatinine Clearance: 41 mL/min (A) (by C-G formula based on SCr of 1.49 mg/dL (H)).    Allergies  Allergen Reactions   Ivp Dye [Iodinated Contrast Media] Hives   Antimicrobials this admission: cefepime 1/18 >>  azithromycin 1/18 >>  Vancomycin 1/18 >>   Microbiology results: 1/18 BCx: ngtd  Thank you for involving pharmacy in this patient's care.   Sheppard Coil PharmD., BCPS Clinical Pharmacist 09/01/2023 8:11 AM

## 2023-09-01 NOTE — Plan of Care (Signed)

## 2023-09-01 NOTE — H&P (Addendum)
History and Physical    Edward Marquez YNW:[id number] DOB: 09/07/1950 DOA: 08/31/2023  PCP: Murrell Converse, MD   Patient coming from: Home   Chief Complaint:  Chief Complaint  Patient presents with   Cough    HPI:  Edward Marquez is a 73 y.o. 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  Fianc checked his blood pressure at home it was 60 systolic prompted them to contact EMS.  They report since New Year's he has had ongoing cough with prolonged coughing spells.  Nonproductive.  Does have associated shortness of breath, rhinorrhea.  He has had decreased p.o. intake over the past few weeks.  Having progressive deconditioning and generalized weakness. Also with chills and episodes of rigors. Had CXR recently as OP with streaky opacity LLL. Not on abx outpatient    Review of Systems:  ROS complete and negative except as marked above   Allergies  Allergen Reactions   Ivp Dye [Iodinated Contrast Media] Hives    Prior to Admission medications   Medication Sig Start Date End Date Taking? Authorizing Provider  amiodarone (PACERONE) 200 MG tablet Take 200 mg by mouth daily.    [provider]  atorvastatin (LIPITOR) 20 MG tablet TAKE 1 TABLET BY MOUTH EVERY DAY 07/27/22   Gilmore Laroche, FNP  ibuprofen (ADVIL,MOTRIN) 200 MG tablet Take 400 mg every 6 (six) hours as needed by mouth for headache or moderate pain.    [provider]  metoprolol succinate (TOPROL-XL) 100 MG 24 hr tablet Take 100 mg by mouth daily. Take with or immediately following a meal.    [provider]  ramipril (ALTACE) 2.5 MG capsule Take 2.5 mg by mouth daily.    [provider]  warfarin (COUMADIN) 7.5 MG tablet Take  3.75-7.5 mg by mouth See admin instructions. Six days a week 3.75 mg then on Tuesdays 7 mg    [provider]    Past Medical History:  Diagnosis Date   Aortic valve defect    Atrial fibrillation (HCC)    CHF (congestive heart failure) (HCC)    Clotting disorder (HCC)    Gout    Hypertension    Peripheral vascular disease (HCC)     Past Surgical History:  Procedure Laterality Date   ABDOMINAL AORTOGRAM W/LOWER EXTREMITY N/A 05/03/2017   Procedure: ABDOMINAL AORTOGRAM W/LOWER EXTREMITY;  Surgeon: Chuck Hint, MD;  Location[facility] INVASIVE CV LAB;  Service: Cardiovascular;  Laterality: N/A;   AORTIC VALVE REPLACEMENT     BYPASS GRAFT POPLITEAL TO TIBIAL Left 05/03/2017   Procedure: LEFT POPLITEAL TO ANTERIOR TIBIAL ARTERY BYPASS WITH VEIN;  Surgeon: Chuck Hint, MD;  Location: [facility] OR;  Service: Vascular;  Laterality: Left;   LOWER EXTREMITY ANGIOGRAPHY N/A 06/24/2017   Procedure: LOWER EXTREMITY ANGIOGRAPHY - Right;  Surgeon: Chuck Hint, MD;  Location: [facility] INVASIVE CV LAB;  Service: Cardiovascular;  Laterality: N/A;   PACEMAKER INSERTION     PERIPHERAL VASCULAR BALLOON ANGIOPLASTY  05/03/2017   Procedure: PERIPHERAL VASCULAR BALLOON ANGIOPLASTY;  Surgeon: Chuck Hint, MD;  Location: [facility] INVASIVE CV LAB;  Service: Cardiovascular;;   PERIPHERAL VASCULAR BALLOON ANGIOPLASTY Right 06/24/2017   Procedure: PERIPHERAL VASCULAR BALLOON ANGIOPLASTY;  Surgeon: Chuck Hint, MD;  Location: [facility] INVASIVE CV LAB;  Service:  Cardiovascular;  Laterality: Right;  ant-tibial / perneal   VEIN HARVEST Left 05/03/2017   Procedure: POPITEAL VEIN HARVEST;  Surgeon: Chuck Hint, MD;  Location: [facility] OR;  Service: Vascular;  Laterality: Left;     reports that he has never smoked. He has never used smokeless tobacco. He reports current alcohol use of about 5.0 standard drinks of alcohol per week. He reports that he does not use drugs.  Family History   Problem Relation Age of Onset   Heart disease Mother 90       bypass surgery   Arthritis Mother    Cancer Mother        breast/ colon   Hyperlipidemia Mother    Hypertension Mother      Physical Exam: Vitals:   09/01/23 0000 09/01/23 0015 09/01/23 0030 09/01/23 0045  BP: 104/71 (!) 86/59 94/61 (!) 102/56  Pulse: 64 60 64 68  Resp: (!) 22 (!) 24 (!) 23 15  Temp:    (!) 97.5 F (36.4 C)  TempSrc:      SpO2: 92% 96% 95% 97%  Weight:      Height:        Gen: Awake, alert, NAD -> later with witnessed episode of rigors / desat + acutely ill appearing  CV: Regular, normal S1, S2, 1/6 SEM  Resp: Normal WOB, CTAB  Abd: Flat, normoactive, nontender MSK: Symmetric, venous stasis changes bilaterally. Trace to 1+ pitting edema to the mid shin  Skin: See MSK for more findings  Neuro: Alert and interactive  Psych: euthymic, appropriate    Data review:   Labs reviewed, notable for:   Lactate 1.1 NA 127 Bicarb 20, no gap Creatinine 1.34, baseline 0.9 T. bili 1.3, AST 84, ALT 65 BNP 76 WBC 13  Micro:  Results for orders placed or performed during the hospital encounter of 08/31/23  Resp panel by RT-PCR (RSV, Flu A&B, Covid) Anterior Nasal Swab     Status: None   Collection Time: 08/31/23  6:13 PM   Specimen: Anterior Nasal Swab  Result Value Ref Range Status   SARS Coronavirus 2 by RT PCR NEGATIVE NEGATIVE Final    Comment: (NOTE) SARS-CoV-2 target nucleic acids are NOT DETECTED.  The SARS-CoV-2 RNA is generally detectable in upper respiratory specimens during the acute phase of infection. The lowest concentration of SARS-CoV-2 viral copies this assay can detect is 138 copies/mL. A negative result does not preclude SARS-Cov-2 infection and should not be used as the sole basis for treatment or other patient management decisions. A negative result may occur with  improper specimen collection/handling, submission of specimen other than nasopharyngeal swab, presence of  viral mutation(s) within the areas targeted by this assay, and inadequate number of viral copies(<138 copies/mL). A negative result must be combined with clinical observations, patient history, and epidemiological information. The expected result is Negative.  Fact Sheet for Patients:  BloggerCourse.com  Fact Sheet for Healthcare Providers:  SeriousBroker.it  This test is no t yet approved or cleared by the [place] FDA and  has been authorized for detection and/or diagnosis of SARS-CoV-2 by FDA under an Emergency Use Authorization (EUA). This EUA will remain  in effect (meaning this test can be used) for the duration of the COVID-19 declaration under Section 564(b)(1) of the Act, 21 U.S.C.section 360bbb-3(b)(1), unless the authorization is terminated  or revoked sooner.       Influenza A by PCR NEGATIVE NEGATIVE Final   Influenza B by PCR NEGATIVE NEGATIVE Final  Comment: (NOTE) The Xpert Xpress SARS-CoV-2/FLU/RSV plus assay is intended as an aid in the diagnosis of influenza from Nasopharyngeal swab specimens and should not be used as a sole basis for treatment. Nasal washings and aspirates are unacceptable for Xpert Xpress SARS-CoV-2/FLU/RSV testing.  Fact Sheet for Patients: BloggerCourse.com  Fact Sheet for Healthcare Providers: SeriousBroker.it  This test is not yet approved or cleared by the [place] FDA and has been authorized for detection and/or diagnosis of SARS-CoV-2 by FDA under an Emergency Use Authorization (EUA). This EUA will remain in effect (meaning this test can be used) for the duration of the COVID-19 declaration under Section 564(b)(1) of the Act, 21 U.S.C. section 360bbb-3(b)(1), unless the authorization is terminated or revoked.     Resp Syncytial Virus by PCR NEGATIVE NEGATIVE Final    Comment: (NOTE) Fact Sheet for  Patients: BloggerCourse.com  Fact Sheet for Healthcare Providers: SeriousBroker.it  This test is not yet approved or cleared by the [place] FDA and has been authorized for detection and/or diagnosis of SARS-CoV-2 by FDA under an Emergency Use Authorization (EUA). This EUA will remain in effect (meaning this test can be used) for the duration of the COVID-19 declaration under Section 564(b)(1) of the Act, 21 U.S.C. section 360bbb-3(b)(1), unless the authorization is terminated or revoked.  Performed at [facility], [address] [place], [place] [address]     Imaging reviewed:  [facility] Chest [facility] 1 View Result Date: 08/31/2023 CLINICAL DATA:  Cough, generalized weakness, short of breath for 2 weeks EXAM: PORTABLE CHEST 1 VIEW COMPARISON:  08/29/2023 FINDINGS: Single frontal view of the chest demonstrates stable multi lead pacer/AICD. Postsurgical changes from median sternotomy and aortic valve replacement. The cardiac silhouette remains enlarged. No acute airspace disease, effusion, or pneumothorax. Prior healed right rib fractures. No acute bony abnormalities. IMPRESSION: 1. Stable chest, no acute process. Electronically Signed   By: Sharlet Salina M.D.   On: 08/31/2023 18:43     ED Course:  Treated with 1 L IV fluid, ceftriaxone, azithromycin, Tylenol   Assessment/Plan:  73 y.o. 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 Likely atypical pneumonia with progression to sepsis, hypotension related to sepsis + hypovolemia with decreased intake.   Atypical pneumonia Sepsis suspect secondary to above, present on admission, ongoing History of almost 3 weeks  prolonged coughing fits, rigors at home.  On initial ED evaluation febrile to 39C, tachypneic.  V-paced so no elevation in heart rate.  Developed hypotension without lactic acidosis after his admission. Lactate 1.1 -> 1.8. WBC 13.  Flu/COVID/RSV negative.  Chest x-ray interestingly no infiltrates although was AP film and on recent outpatient chest x-ray hide left lower streaky opacities suspect this is obscured by his cardiac shadow.  Limited other localizing symptoms, does have history of liver lesion and mild elevation in liver enzymes suspected secondary to sepsis but will investigate per below. - Status post ceftriaxone, azithromycin in the ED.  Broadened to vancomycin, cefepime, continue azithromycin. - Follow-up blood cultures - Status post 1 L IV fluid, give additional 1 L IV fluid.  Lactate within normal limit had risen despite additional IV fluids.  Started on maintenance fluid NS at 100 cc/h for 10 hr then continue oral hydration  - Check RVP, sputum culture - Check right upper  quadrant ultrasound - Symptomatic management per respiratory failure below  Hypotension, mixed distributive and hypovolemic History of prolonged decreased intake clinically hypovolemic.  On top of this septic from suspected pneumonia.  Had systolic in the 60s at home.  In the ED MAP borderline intermittently 60-65. - IV fluids, management of sepsis per above  Acute hypoxic respiratory failure Developed acute cyanosis during episode of rigoring witnessed in the ED.  Pulse ox not picking up well, appears likely related to vasoconstriction, rigoring.  Initially on nasal cannula but due to borderline sats placed on high flow nasal cannula. - Sat goal greater than 92% - Albuterol neb every 4 hours as needed, I-S, flutter valve, out of bed to chair. - Management of underlying pneumonia per above  AKI stage I Baseline creatinine 0.9, elevated to 1.3 on admission, prerenal in setting of hypovolemia and sepsis. - IV  fluids per above -Check PVR  Acute liver injury, hepatocellular Mild elevation in transaminases AST 84, ALT 65, minimal elevation in bili 1.3, normal alk phos.  Suspect this is in the setting of sepsis.  Note of prior indeterminate hypodense liver lesion 1.2 cm incidentally seen on CT imaging outpatient. - Right upper quadrant ultrasound -Trend LFT  Hyponatremia, moderate, presumed chronic, asymptomatic NA 127, suspect hypovolemic with low solute - Trend BMP with IV fluids per above  Chronic medical problems: Heart failure with reduced ejection fraction, CRT-D, without acute exacerbation: Hold his home furosemide in the setting of sepsis, likely will need this re: initiated in next day or 2.  Hold his metoprolol, ramipril, spironolactone in the setting of hypotension.  Not on SGLT2 inhibitor. Mechanical aortic valve: On warfarin INR goal 2.5-3.5.  Dose recently decreased to 3.75 mg daily.  Pharmacy to dose warfarin. History A-fib: On anticoagulation per above.  Continue amiodarone 200 mg daily.  Holding metoprolol for now due to hypotension. PAD with history of bypass: Anticoagulation per above, continue home atorvastatin, Zetia History aortic aneurysm: Outpatient surveillance Indeterminate liver lesion: Noted on outpatient imaging, may be seen on ultrasound above.  Planning to have an MRI abdomen outpatient. History of lung nodule: Outpatient surveillance  Body mass index is 25.82 kg/m.    DVT prophylaxis:  Coumadin Code Status:  Full Code Diet:  Diet Orders (From admission, onward)     Start     Ordered   08/31/23 2109  Diet 2 gram sodium Room service appropriate? Yes; Fluid consistency: Thin  Diet effective now       Question Answer Comment  Room service appropriate? Yes   Fluid consistency: Thin      08/31/23 2112           Family Communication:  Yes discussed with fiance at bedside   Consults:  No   Admission status:   Inpatient, Step Down Unit  Severity of  Illness: The appropriate patient status for this patient is INPATIENT. Inpatient status is judged to be reasonable and necessary in order to provide the required intensity of service to ensure the patient's safety. The patient's presenting symptoms, physical exam findings, and initial radiographic and laboratory data in the context of their chronic comorbidities is felt to place them at high risk for further clinical deterioration. Furthermore, it is not anticipated that the patient will be medically stable for discharge from the hospital within 2 midnights of admission.   * I certify that at the point of admission it is my clinical judgment that the patient will require inpatient hospital care spanning beyond 2 midnights  from the point of admission due to high intensity of service, high risk for further deterioration and high frequency of surveillance required.*   Dolly Rias, MD [facility]  How to contact the [facility] Attending or Consulting provider 7A - 7P or covering provider during after hours 7P -7A, for this patient.  Check the care team in [facility] and look for a) attending/consulting [facility] provider listed and b) the [facility] team listed Log into [email or web address] and use [place]'s universal password to access. If you do not have the password, please contact the hospital operator. Locate the [facility] provider you are looking for under [facility] and page to a number that you can be directly reached. If you still have difficulty reaching the provider, please page the [facility] (Director on Call) for the Hospitalists listed on amion for assistance.  09/01/2023, 2:11 AM

## 2023-09-01 NOTE — ED Notes (Signed)
ED TO INPATIENT HANDOFF REPORT  ED Nurse Name and Phone #: [phone]   S Name/Age/Gender Edward Marquez 73 y.o. male Room/Bed: APA19/APA19  Code Status   Code Status: Full Code  Home/SNF/Other Home Patient oriented to: self, place, time, and situation Is this baseline? Yes   Triage Complete: Triage complete  Chief Complaint CAP (community acquired pneumonia) [J18.9]  Triage Note Pt arrived via [facility] from home c/o cough, generalized weakness, shob x 2 weeks that has gotten worse, pt seen provider last week and was given tessalon pearls for cough. Pt has a fever at this time of 102.2 F.    Allergies Allergies  Allergen Reactions   Ivp Dye [Iodinated Contrast Media] Hives    Level of Care/Admitting Diagnosis ED Disposition     ED Disposition  Admit   Condition  --   Comment  Hospital Area: [facility] [[id number]]  Level of Care: Med-Surg [16]  Covid Evaluation: Asymptomatic - no recent exposure (last 10 days) testing not required  Diagnosis: CAP (community acquired pneumonia) [191478]  Admitting Physician: Cleora Fleet [[id number]]  Attending Physician: Cleora Fleet [[id number]]  Certification:: I certify this patient will need inpatient services for at least 2 midnights          B Medical/Surgery History Past Medical History:  Diagnosis Date   Aortic valve defect    Atrial fibrillation (HCC)    CHF (congestive heart failure) (HCC)    Clotting disorder (HCC)    Gout    Hypertension    Peripheral vascular disease (HCC)    Past Surgical History:  Procedure Laterality Date   ABDOMINAL AORTOGRAM W/LOWER EXTREMITY N/A 05/03/2017   Procedure: ABDOMINAL AORTOGRAM W/LOWER EXTREMITY;  Surgeon: Chuck Hint, MD;  Location: [facility] INVASIVE CV LAB;  Service: Cardiovascular;  Laterality: N/A;   AORTIC VALVE REPLACEMENT     BYPASS GRAFT POPLITEAL TO TIBIAL Left 05/03/2017   Procedure: LEFT POPLITEAL TO ANTERIOR TIBIAL ARTERY BYPASS WITH VEIN;  Surgeon:  Chuck Hint, MD;  Location[facility] OR;  Service: Vascular;  Laterality: Left;   LOWER EXTREMITY ANGIOGRAPHY N/A 06/24/2017   Procedure: LOWER EXTREMITY ANGIOGRAPHY - Right;  Surgeon: Chuck Hint, MD;  Location: [facility] INVASIVE CV LAB;  Service: Cardiovascular;  Laterality: N/A;   PACEMAKER INSERTION     PERIPHERAL VASCULAR BALLOON ANGIOPLASTY  05/03/2017   Procedure: PERIPHERAL VASCULAR BALLOON ANGIOPLASTY;  Surgeon: Chuck Hint, MD;  Location: [facility] INVASIVE CV LAB;  Service: Cardiovascular;;   PERIPHERAL VASCULAR BALLOON ANGIOPLASTY Right 06/24/2017   Procedure: PERIPHERAL VASCULAR BALLOON ANGIOPLASTY;  Surgeon: Chuck Hint, MD;  Location: [facility] INVASIVE CV LAB;  Service: Cardiovascular;  Laterality: Right;  ant-tibial / perneal   VEIN HARVEST Left 05/03/2017   Procedure: POPITEAL VEIN HARVEST;  Surgeon: Chuck Hint, MD;  Location: [facility] OR;  Service: Vascular;  Laterality: Left;     A IV Location/Drains/Wounds Patient Lines/Drains/Airways Status     Active Line/Drains/Airways     Name Placement date Placement time Site Days   Peripheral IV 08/31/23 20 G 1" Anterior;Right Forearm 08/31/23  1837  Forearm  1   Incision (Closed) 05/03/17 Thigh Left 05/03/17  1624  -- 2312   Incision (Closed) 05/03/17 Knee Left 05/03/17  1624  -- 2312   Incision (Closed) 05/03/17 Leg Left 05/03/17  1624  -- 2312   Incision (Closed) 05/03/17 Leg Left 05/03/17  1624  -- 2312   Wound / Incision (Open or Dehisced) 05/01/17 Leg Left;Lower yellow eschar 05/01/17  2222  Leg  2314   Wound / Incision (Open or Dehisced) 06/22/17 Non-pressure wound Toe (Comment  which one) Right  06/22/17  0700  Toe (Comment  which one)  2262            Intake/Output Last 24 hours  Intake/Output Summary (Last 24 hours) at 09/01/2023 0900 Last data filed at 09/01/2023 1610 Gross per 24 hour  Intake 2850 ml  Output --  Net 2850 ml    Labs/Imaging Results for orders placed or performed  during the hospital encounter of 08/31/23 (from the past 48 hours)  Resp panel by RT-PCR (RSV, Flu A&B, Covid) Anterior Nasal Swab     Status: None   Collection Time: 08/31/23  6:13 PM   Specimen: Anterior Nasal Swab  Result Value Ref Range   SARS Coronavirus 2 by RT PCR NEGATIVE NEGATIVE    Comment: (NOTE) SARS-CoV-2 target nucleic acids are NOT DETECTED.  The SARS-CoV-2 RNA is generally detectable in upper respiratory specimens during the acute phase of infection. The lowest concentration of SARS-CoV-2 viral copies this assay can detect is 138 copies/mL. A negative result does not preclude SARS-Cov-2 infection and should not be used as the sole basis for treatment or other patient management decisions. A negative result may occur with  improper specimen collection/handling, submission of specimen other than nasopharyngeal swab, presence of viral mutation(s) within the areas targeted by this assay, and inadequate number of viral copies(<138 copies/mL). A negative result must be combined with clinical observations, patient history, and epidemiological information. The expected result is Negative.  Fact Sheet for Patients:  BloggerCourse.com  Fact Sheet for Healthcare Providers:  SeriousBroker.it  This test is no t yet approved or cleared by the [place] FDA and  has been authorized for detection and/or diagnosis of SARS-CoV-2 by FDA under an Emergency Use Authorization (EUA). This EUA will remain  in effect (meaning this test can be used) for the duration of the COVID-19 declaration under Section 564(b)(1) of the Act, 21 U.S.C.section 360bbb-3(b)(1), unless the authorization is terminated  or revoked sooner.       Influenza A by PCR NEGATIVE NEGATIVE   Influenza B by PCR NEGATIVE NEGATIVE    Comment: (NOTE) The Xpert Xpress SARS-CoV-2/FLU/RSV plus assay is intended as an aid in the diagnosis of influenza from  Nasopharyngeal swab specimens and should not be used as a sole basis for treatment. Nasal washings and aspirates are unacceptable for Xpert Xpress SARS-CoV-2/FLU/RSV testing.  Fact Sheet for Patients: BloggerCourse.com  Fact Sheet for Healthcare Providers: SeriousBroker.it  This test is not yet approved or cleared by the [place] FDA and has been authorized for detection and/or diagnosis of SARS-CoV-2 by FDA under an Emergency Use Authorization (EUA). This EUA will remain in effect (meaning this test can be used) for the duration of the COVID-19 declaration under Section 564(b)(1) of the Act, 21 U.S.C. section 360bbb-3(b)(1), unless the authorization is terminated or revoked.     Resp Syncytial Virus by PCR NEGATIVE NEGATIVE    Comment: (NOTE) Fact Sheet for Patients: BloggerCourse.com  Fact Sheet for Healthcare Providers: SeriousBroker.it  This test is not yet approved or cleared by the [place] FDA and has been authorized for detection and/or diagnosis of SARS-CoV-2 by FDA under an Emergency Use Authorization (EUA). This EUA will remain in effect (meaning this test can be used) for the duration of the COVID-19 declaration under Section 564(b)(1) of the Act, 21 U.S.C. section 360bbb-3(b)(1), unless the authorization is terminated or  revoked.  Performed at [facility], [address] [place], [place] [address]   CBC with Differential     Status: Abnormal   Collection Time: 08/31/23  6:28 PM  Result Value Ref Range   WBC 13.7 (H) 4.0 - 10.5 K/uL   RBC 3.48 (L) 4.22 - 5.81 MIL/uL   Hemoglobin 11.3 (L) 13.0 - 17.0 g/dL   HCT 91.4 (L) 78.2 - 95.6 %   MCV 97.4 80.0 - 100.0 fL   MCH 32.5 26.0 - 34.0 pg   MCHC 33.3 30.0 - 36.0 g/dL   RDW 21.3 08.6 - 57.8 %   Platelets 138 (L) 150 - 400 K/uL   nRBC 0.0 0.0 - 0.2 %   Neutrophils Relative % 89 %   Neutro Abs 12.1 (H)  1.7 - 7.7 K/uL   Lymphocytes Relative 3 %   Lymphs Abs 0.4 (L) 0.7 - 4.0 K/uL   Monocytes Relative 8 %   Monocytes Absolute 1.2 (H) 0.1 - 1.0 K/uL   Eosinophils Relative 0 %   Eosinophils Absolute 0.1 0.0 - 0.5 K/uL   Basophils Relative 0 %   Basophils Absolute 0.0 0.0 - 0.1 K/uL   Immature Granulocytes 0 %   Abs Immature Granulocytes 0.05 0.00 - 0.07 K/uL    Comment: Performed at [facility], [address] [place], [place] [address]  Comprehensive metabolic panel     Status: Abnormal   Collection Time: 08/31/23  6:28 PM  Result Value Ref Range   Sodium 127 (L) 135 - 145 mmol/L   Potassium 4.7 3.5 - 5.1 mmol/L   Chloride 101 98 - 111 mmol/L   CO2 20 (L) 22 - 32 mmol/L   Glucose, Bld 138 (H) 70 - 99 mg/dL    Comment: Glucose reference range applies only to samples taken after fasting for at least 8 hours.   BUN 36 (H) 8 - 23 mg/dL   Creatinine, Ser 9.52 (H) 0.61 - 1.24 mg/dL   Calcium 8.3 (L) 8.9 - 10.3 mg/dL   Total Protein 7.4 6.5 - 8.1 g/dL   Albumin 3.2 (L) 3.5 - 5.0 g/dL   AST 84 (H) 15 - 41 U/L   ALT 65 (H) 0 - 44 U/L   Alkaline Phosphatase 100 38 - 126 U/L   Total Bilirubin 1.3 (H) 0.0 - 1.2 mg/dL   GFR, Estimated 57 (L) >60 mL/min    Comment: (NOTE) Calculated using the CKD-EPI Creatinine Equation (2021)    Anion gap 6 5 - 15    Comment: Performed at [facility], [address], [place], [place] [address]  Brain natriuretic peptide     Status: Abnormal   Collection Time: 08/31/23  6:28 PM  Result Value Ref Range   B Natriuretic Peptide 786.0 (H) 0.0 - 100.0 pg/mL    Comment: Performed at [facility], [address] [place], [place] [address]  Protime-INR     Status: Abnormal   Collection Time: 08/31/23  6:28 PM  Result Value Ref Range   Prothrombin Time 31.8 (H) 11.4 - 15.2 seconds   INR 3.1 (H) 0.8 - 1.2    Comment: (NOTE) INR goal varies based on device and disease states. Performed at [facility], [address] [place], [place] [address]   Blood  culture (routine x 2)     Status: None (Preliminary result)   Collection Time: 08/31/23  7:59 PM   Specimen: Left Antecubital; Blood  Result Value Ref Range   Specimen Description LEFT ANTECUBITAL  Special Requests Normal    Culture      NO GROWTH < 12 HOURS Performed at [facility], [address], [place], [place] [address]    Report Status PENDING   Lactic acid, plasma     Status: None   Collection Time: 08/31/23  8:06 PM  Result Value Ref Range   Lactic Acid, Venous 1.1 0.5 - 1.9 mmol/L    Comment: Performed at [facility], [address], [place], [place] [address]  Blood culture (routine x 2)     Status: None (Preliminary result)   Collection Time: 08/31/23  8:13 PM   Specimen: BLOOD  Result Value Ref Range   Specimen Description BLOOD BLOOD LEFT HAND    Special Requests Normal    Culture      NO GROWTH < 12 HOURS Performed at [facility], [address] [place], [place] [address]    Report Status PENDING   Lactic acid, plasma     Status: None   Collection Time: 08/31/23 10:39 PM  Result Value Ref Range   Lactic Acid, Venous 1.8 0.5 - 1.9 mmol/L    Comment: Performed at [facility], [address] [place], [place] [address]  Protime-INR     Status: Abnormal   Collection Time: 08/31/23 10:39 PM  Result Value Ref Range   Prothrombin Time 35.3 (H) 11.4 - 15.2 seconds   INR 3.5 (H) 0.8 - 1.2    Comment: (NOTE) INR goal varies based on device and disease states. Performed at [facility], [address], [place], [place] [address]   Basic metabolic panel     Status: Abnormal   Collection Time: 09/01/23  5:47 AM  Result Value Ref Range   Sodium 131 (L) 135 - 145 mmol/L   Potassium 4.6 3.5 - 5.1 mmol/L   Chloride 103 98 - 111 mmol/L   CO2 19 (L) 22 - 32 mmol/L   Glucose, Bld 226 (H) 70 - 99 mg/dL    Comment: Glucose reference range applies only to samples taken after fasting for at least 8 hours.   BUN 41 (H) 8 - 23 mg/dL   Creatinine, Ser 7.42 (H) 0.61 - 1.24  mg/dL   Calcium 7.7 (L) 8.9 - 10.3 mg/dL   GFR, Estimated 50 (L) >60 mL/min    Comment: (NOTE) Calculated using the CKD-EPI Creatinine Equation (2021)    Anion gap 9 5 - 15    Comment: Performed at [facility], [address], [place], [place] [address]  CBC     Status: Abnormal   Collection Time: 09/01/23  5:47 AM  Result Value Ref Range   WBC 16.1 (H) 4.0 - 10.5 K/uL   RBC 3.30 (L) 4.22 - 5.81 MIL/uL   Hemoglobin 10.6 (L) 13.0 - 17.0 g/dL   HCT 87.5 (L) 64.3 - 32.9 %   MCV 98.8 80.0 - 100.0 fL   MCH 32.1 26.0 - 34.0 pg   MCHC 32.5 30.0 - 36.0 g/dL   RDW 51.8 84.1 - 66.0 %   Platelets 111 (L) 150 - 400 K/uL   nRBC 0.0 0.0 - 0.2 %    Comment: Performed at [facility], [address] [place], [place] [address]  Magnesium     Status: None   Collection Time: 09/01/23  5:47 AM  Result Value Ref Range   Magnesium 2.3 1.7 - 2.4 mg/dL    Comment: Performed at [facility], [address] [place], [place] [address]  Phosphorus  Status: None   Collection Time: 09/01/23  5:47 AM  Result Value Ref Range   Phosphorus 2.6 2.5 - 4.6 mg/dL    Comment: Performed at [facility], [address] [place], [place] [address]  Protime-INR     Status: Abnormal   Collection Time: 09/01/23  5:47 AM  Result Value Ref Range   Prothrombin Time 38.1 (H) 11.4 - 15.2 seconds   INR 3.9 (H) 0.8 - 1.2    Comment: (NOTE) INR goal varies based on device and disease states. Performed at [facility], [address], [place], [place] [address]    DG CHEST PORT 1 VIEW Result Date: 09/01/2023 CLINICAL DATA:  Acute respiratory failure with hypoxia. Cough and shortness of breath 2 weeks. Fever. EXAM: PORTABLE CHEST 1 VIEW COMPARISON:  08/31/2023 FINDINGS: Patient is slightly rotated to the right. Sternotomy wires unchanged. Left-sided pacemaker unchanged. Lungs are hypoinflated without focal airspace consolidation or effusion. Stable borderline cardiomegaly. Remainder of the exam is unchanged. IMPRESSION: 1. No  active disease. 2. Stable borderline cardiomegaly. Electronically Signed   By: Elberta Fortis M.D.   On: 09/01/2023 08:01   DG Chest Port 1 View Result Date: 08/31/2023 CLINICAL DATA:  Cough, generalized weakness, short of breath for 2 weeks EXAM: PORTABLE CHEST 1 VIEW COMPARISON:  08/29/2023 FINDINGS: Single frontal view of the chest demonstrates stable multi lead pacer/AICD. Postsurgical changes from median sternotomy and aortic valve replacement. The cardiac silhouette remains enlarged. No acute airspace disease, effusion, or pneumothorax. Prior healed right rib fractures. No acute bony abnormalities. IMPRESSION: 1. Stable chest, no acute process. Electronically Signed   By: Sharlet Salina M.D.   On: 08/31/2023 18:43    Pending Labs Unresulted Labs (From admission, onward)     Start     Ordered   09/02/23 0500  Protime-INR  Daily,   R      08/31/23 2254   09/02/23 0500  CBC  Daily,   R      08/31/23 2254   08/31/23 2112  Urinalysis, Routine w reflex microscopic -Urine, Clean Catch  Once,   R       Question:  Specimen Source  Answer:  Urine, Clean Catch   08/31/23 2112   08/31/23 2111  Expectorated Sputum Assessment w Gram Stain, Rflx to Resp Cult  Once,   R        08/31/23 2112   08/31/23 2110  Respiratory (~20 pathogens) panel by PCR  (Respiratory panel by PCR (~20 pathogens, ~24 hr TAT)  w precautions)  Once,   R        08/31/23 2112            Vitals/Pain Today's Vitals   09/01/23 0730 09/01/23 0745 09/01/23 0800 09/01/23 0830  BP: 110/63 108/71 97/70 95/63   Pulse: 60 68 64 64  Resp: 19 17 16 17   Temp:      TempSrc:      SpO2: 100% 100% 100% 100%  Weight:      Height:      PainSc:        Isolation Precautions Droplet precaution  Medications Medications  sodium chloride flush (NS) 0.9 % injection 3 mL (3 mLs Intravenous Not Given 08/31/23 2208)  albuterol (PROVENTIL) (2.5 MG/3ML) 0.083% nebulizer solution 2.5 mg (2.5 mg Nebulization Given 09/01/23 0126)   azithromycin (ZITHROMAX) tablet 500 mg (has no administration in time range)  amiodarone (PACERONE) tablet 200 mg (has no administration in time range)  atorvastatin (LIPITOR) tablet 20 mg (has  no administration in time range)  ezetimibe (ZETIA) tablet 10 mg (has no administration in time range)  ceFEPIme (MAXIPIME) 2 g in sodium chloride 0.9 % 100 mL IVPB (0 g Intravenous Stopped 08/31/23 2243)  Warfarin - Pharmacist Dosing Inpatient (0 each Does not apply Hold 09/01/23 1600)  midodrine (PROAMATINE) tablet 5 mg (5 mg Oral Given 09/01/23 0422)  vancomycin (VANCOCIN) IVPB 1000 mg/200 mL premix (has no administration in time range)  acetaminophen (TYLENOL) tablet 1,000 mg (1,000 mg Oral Given 08/31/23 1810)  sodium chloride 0.9 % bolus 1,000 mL (0 mLs Intravenous Stopped 08/31/23 2020)  cefTRIAXone (ROCEPHIN) 1 g in sodium chloride 0.9 % 100 mL IVPB (0 g Intravenous Stopped 08/31/23 2113)  azithromycin (ZITHROMAX) 500 mg in sodium chloride 0.9 % 250 mL IVPB (0 mg Intravenous Stopped 08/31/23 2126)  sodium chloride 0.9 % bolus 1,000 mL (0 mLs Intravenous Stopped 08/31/23 2243)  vancomycin (VANCOREADY) IVPB 1500 mg/300 mL (0 mg Intravenous Stopped 09/01/23 0042)  warfarin (COUMADIN) tablet 3.75 mg (3.75 mg Oral Given 09/01/23 0010)  morphine (PF) 2 MG/ML injection 2 mg (2 mg Intravenous Given 09/01/23 0128)  ipratropium-albuterol (DUONEB) 0.5-2.5 (3) MG/3ML nebulizer solution (3 mLs  Given 09/01/23 0126)  acetaminophen (OFIRMEV) IV 1,000 mg (0 mg Intravenous Stopped 09/01/23 1610)    Mobility walks     Focused Assessments    R Recommendations: See Admitting Provider Note  Report given to:   Additional Notes:

## 2023-09-01 NOTE — ED Notes (Signed)
[place] complete. Pharmacy at the bedside completing medication reconciliation.

## 2023-09-01 NOTE — ED Notes (Signed)
Secretary asked this tech to check pulse ox on pt. The O2 did not have a good wave and changed the pulse ox to get a better reading. Pt sounded short of breath and having wheezing sounds. Checked temp 97.5 due to shaking and being cold. RN notified. Went to change pts bed to a hospital bed and noticed color change (purple/blue) hospital doc at nurses station and asked him to look at pt. Verbal consent to 6L nasal canula at first then no improvement on color then asked to place pt on 15L on non-rebreather. Respiratory called for a treatment. Pt was cleaned up and switched over to a more comfortable bed at this time. RT at bedside now. Primary RN made aware. `

## 2023-09-01 NOTE — TOC CM/SW Note (Signed)
Transition of Care [facility]) - Inpatient Brief Assessment   Patient Details  Name: Edward Marquez MRN: [id number] Date of Birth: 13-Feb-1951  Transition of Care [facility]) CM/SW Contact:    Villa Herb, LCSWA Phone Number: 09/01/2023, 10:08 AM   Clinical Narrative: Transition of Care [facility]) has reviewed patient and no TOC needs have been identified at this time. We will continue to monitor patient advancement through interdisciplinary progression rounds. If new patient transition needs arise, please place a TOC consult.  Transition of Care Asessment: Insurance and Status: Insurance coverage has been reviewed Patient has primary care physician: Yes Home environment has been reviewed: From home Prior level of function:: Independent Prior/Current Home Services: No current home services Social Drivers of Health Review: SDOH reviewed no interventions necessary Readmission risk has been reviewed: Yes Transition of care needs: no transition of care needs at this time

## 2023-09-01 NOTE — Progress Notes (Signed)
PROGRESS NOTE   Edward Marquez  OZD:[id number] DOB: 1951-04-15 DOA: 08/31/2023 PCP: Murrell Converse, MD   Chief Complaint  Patient presents with   Cough   Level of care: Med-Surg  Brief Admission History:  73 y.o. 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  Fianc checked his blood pressure at home it was 60 systolic prompted them to contact EMS.  They report since New Year's he has had ongoing cough with prolonged coughing spells.  Nonproductive.  Does have associated shortness of breath, rhinorrhea.  He has had decreased p.o. intake over the past few weeks.  Having progressive deconditioning and generalized weakness. Also with chills and episodes of rigors. Had CXR recently as OP with streaky opacity LLL. Not on abx outpatient.    Assessment and Plan:  Working diagnosis of pneumonia --check procalcitonin --presented with 3 weeks of prolonged coughing fits, rigors at home.  On initial ED evaluation febrile to 39C, tachypneic.  V-paced so no elevation in heart rate.  Developed hypotension without lactic acidosis after his admission. Lactate 1.1 -> 1.8. WBC 13.  Flu/COVID/RSV negative.  Chest x-ray interestingly no infiltrates although was AP film and on recent outpatient chest x-ray hide left lower streaky opacities suspect this is obscured by his cardiac shadow.   - continue cefepime/doxycycline - Follow-up blood cultures: prelim culture gram positive cocci  - Follow up RVP, sputum culture - right upper quadrant ultrasound reassuring  - Symptomatic management per respiratory failure below   Hypotension - treated and resolved  History of prolonged decreased intake clinically hypovolemic.  On top of this septic from suspected pneumonia.  Had systolic in  the 25Z at home.  In the ED MAP borderline intermittently 60-65. - Treated with IV fluids and resolved    Acute hypoxic respiratory failure - resolved  Developed acute cyanosis during episode of rigoring witnessed in the ED.  Pulse ox not picking up well, appears likely related to vasoconstriction, rigoring.  Initially on nasal cannula but due to borderline sats placed on high flow nasal cannula. - weaned down to room air oxygen  - Albuterol neb every 4 hours as needed, I-S, flutter valve, out of bed to chair. - Management of underlying pneumonia per above   Positive Blood Culture Gram positive cocci in the anaerobic bottle -- given his aortic valve replacement history we will monitor this closely -- repeat blood cultures in AM  -- MRSA screen negative -- vancomycin discontinued -- pt had an echo done earlier this month with no report of vegetations  Stage 3a CKD -- creatinine holding stable, following   Acute liver injury, hepatocellular Mild elevation in transaminases AST 84, ALT 65, minimal elevation in bili 1.3, normal alk phos.  Suspect this is in the setting of sepsis.  Note of prior indeterminate hypodense liver lesion 1.2 cm incidentally seen on CT imaging outpatient. - Right upper quadrant ultrasound unrevealing of cause  -Trend LFTs   Hyponatremia, moderate, presumed chronic, asymptomatic NA 127, suspect hypovolemic with low solute - Trend BMP with IV fluids per above   Chronic HFrEF --recent TTE reviewed LVEF 35-40%  Held his home furosemide temporarily with plan to restart   S/p Mechanical aortic valve replacement  On warfarin INR goal 2.5-3.5.  Dose recently decreased to 3.75 mg daily.  Pharmacy to dose  warfarin.  History A-fib: On anticoagulation per above.  Continue amiodarone 200 mg daily.  Resumed lower dose metoprolol with holding parameters  PAD with history of bypass: Anticoagulation per above, continue home atorvastatin, Zetia  History aortic aneurysm:  Outpatient surveillance  Indeterminate liver lesion: Noted on outpatient imaging, may be seen on ultrasound above.  Planning to have an MRI abdomen outpatient.  History of lung nodule: Outpatient surveillance   Echocardiogram from OSH Jan 2025  There is mild concentric left ventricular hypertrophy. The left ventricular size is normal. Left ventricular systolic function is moderately reduced. The LV apex is akinetic. LV ejection fraction = 35-40%. The right ventricle is moderately dilated. RV systolic function indeterminant, but appaears normal to no more than mildly reduced. Device lead in the right ventricle The left atrium is severely dilated. There is a mechanical aortic valve. Aortic valve mean pressure gradient is 9 mmHg. There is no significant aortic regurgitation. There is mild mitral regurgitation. There is moderate tricuspid regurgitation. Mild pulmonary hypertension. The inferior vena cava is dilated with no inspiratory collapse, suggesting severely elevated right atrial pressure. Ascending aortic aneurysm measuring 4.7 cm. There is no pericardial effusion. Compared to the most recent study from 2023, apical akinesis is new.   DVT prophylaxis: warfarin (supratherapeutic) Code Status: Full  Family Communication:  Disposition: home in 1-2 days    Consultants:  Pharm D  Procedures:   Antimicrobials:  Cefepime / doxycycline Vancomycin 1/18-1/19 Azithromycin 1/18   Subjective: Pt says he is breathing much better today and cough seems to be slowing down.   Objective: Vitals:   09/01/23 0900 09/01/23 0910 09/01/23 1008 09/01/23 1038  BP: 104/64   113/76  Pulse:    64  Resp: 19   18  Temp:  (!) 97.3 F (36.3 C)  98 F (36.7 C)  TempSrc:  Axillary  Oral  SpO2:    98%  Weight:   78.8 kg   Height:   5\' 6"  (1.676 m)     Intake/Output Summary (Last 24 hours) at 09/01/2023 1233 Last data filed at 09/01/2023 1030 Gross per 24 hour  Intake 3090 ml  Output 500  ml  Net 2590 ml   Filed Weights   08/31/23 1945 08/31/23 2144 09/01/23 1008  Weight: 74.4 kg 72.6 kg 78.8 kg   Examination:  General exam: Appears calm and comfortable  Respiratory system: good air movement, no rales heard. Cardiovascular system: normal S1 & S2 heard. No JVD, murmurs, rubs, gallops or clicks. No pedal edema. Gastrointestinal system: Abdomen is nondistended, soft and nontender. No organomegaly or masses felt. Normal bowel sounds heard. Central nervous system: Alert and oriented. No focal neurological deficits. Extremities: Symmetric 5 x 5 power. Skin: No rashes, lesions or ulcers. Psychiatry: Judgement and insight appear normal. Mood & affect appropriate.   Data Reviewed: I have personally reviewed following labs and imaging studies  CBC: Recent Labs  Lab 08/31/23 1828 09/01/23 0547  WBC 13.7* 16.1*  NEUTROABS 12.1*  --   HGB 11.3* 10.6*  HCT 33.9* 32.6*  MCV 97.4 98.8  PLT 138* 111*    Basic Metabolic Panel: Recent Labs  Lab 08/31/23 1828 09/01/23 0547  NA 127* 131*  K 4.7 4.6  CL 101 103  CO2 20* 19*  GLUCOSE 138* 226*  BUN 36* 41*  CREATININE 1.34* 1.49*  CALCIUM 8.3* 7.7*  MG  --  2.3  PHOS  --  2.6    CBG: No results for input(s): "GLUCAP" in the last 168  hours.  Recent Results (from the past 240 hours)  Resp panel by RT-PCR (RSV, Flu A&B, Covid) Anterior Nasal Swab     Status: None   Collection Time: 08/31/23  6:13 PM   Specimen: Anterior Nasal Swab  Result Value Ref Range Status   SARS Coronavirus 2 by RT PCR NEGATIVE NEGATIVE Final    Comment: (NOTE) SARS-CoV-2 target nucleic acids are NOT DETECTED.  The SARS-CoV-2 RNA is generally detectable in upper respiratory specimens during the acute phase of infection. The lowest concentration of SARS-CoV-2 viral copies this assay can detect is 138 copies/mL. A negative result does not preclude SARS-Cov-2 infection and should not be used as the sole basis for treatment or other patient  management decisions. A negative result may occur with  improper specimen collection/handling, submission of specimen other than nasopharyngeal swab, presence of viral mutation(s) within the areas targeted by this assay, and inadequate number of viral copies(<138 copies/mL). A negative result must be combined with clinical observations, patient history, and epidemiological information. The expected result is Negative.  Fact Sheet for Patients:  BloggerCourse.com  Fact Sheet for Healthcare Providers:  SeriousBroker.it  This test is no t yet approved or cleared by the [place] FDA and  has been authorized for detection and/or diagnosis of SARS-CoV-2 by FDA under an Emergency Use Authorization (EUA). This EUA will remain  in effect (meaning this test can be used) for the duration of the COVID-19 declaration under Section 564(b)(1) of the Act, 21 U.S.C.section 360bbb-3(b)(1), unless the authorization is terminated  or revoked sooner.       Influenza A by PCR NEGATIVE NEGATIVE Final   Influenza B by PCR NEGATIVE NEGATIVE Final    Comment: (NOTE) The Xpert Xpress SARS-CoV-2/FLU/RSV plus assay is intended as an aid in the diagnosis of influenza from Nasopharyngeal swab specimens and should not be used as a sole basis for treatment. Nasal washings and aspirates are unacceptable for Xpert Xpress SARS-CoV-2/FLU/RSV testing.  Fact Sheet for Patients: BloggerCourse.com  Fact Sheet for Healthcare Providers: SeriousBroker.it  This test is not yet approved or cleared by the [place] FDA and has been authorized for detection and/or diagnosis of SARS-CoV-2 by FDA under an Emergency Use Authorization (EUA). This EUA will remain in effect (meaning this test can be used) for the duration of the COVID-19 declaration under Section 564(b)(1) of the Act, 21 U.S.C. section 360bbb-3(b)(1),  unless the authorization is terminated or revoked.     Resp Syncytial Virus by PCR NEGATIVE NEGATIVE Final    Comment: (NOTE) Fact Sheet for Patients: BloggerCourse.com  Fact Sheet for Healthcare Providers: SeriousBroker.it  This test is not yet approved or cleared by the [place] FDA and has been authorized for detection and/or diagnosis of SARS-CoV-2 by FDA under an Emergency Use Authorization (EUA). This EUA will remain in effect (meaning this test can be used) for the duration of the COVID-19 declaration under Section 564(b)(1) of the Act, 21 U.S.C. section 360bbb-3(b)(1), unless the authorization is terminated or revoked.  Performed at [facility], [address], [place], [place] [address]   Blood culture (routine x 2)     Status: None (Preliminary result)   Collection Time: 08/31/23  7:59 PM   Specimen: Left Antecubital; Blood  Result Value Ref Range Status   Specimen Description LEFT ANTECUBITAL  Final   Special Requests Normal  Final   Culture  Setup Time   Final    GRAM POSITIVE COCCI IN BOTH AEROBIC AND ANAEROBIC BOTTLES Gram Stain  Report Called to,Read Back By and Verified With: CHRYSTAL R. ON 09/01/2023 @11 :15 BY T.HAMER. Performed at [facility], [address], [place], [place] [address]    Culture [facility] POSITIVE COCCI  Final   Report Status PENDING  Incomplete  Blood culture (routine x 2)     Status: None (Preliminary result)   Collection Time: 08/31/23  8:13 PM   Specimen: BLOOD  Result Value Ref Range Status   Specimen Description BLOOD BLOOD LEFT HAND  Final   Special Requests Normal  Final   Culture   Final    NO GROWTH < 12 HOURS Performed at [facility], [address], [place], [place] [address]    Report Status PENDING  Incomplete  MRSA Next Gen by PCR, Nasal     Status: None   Collection Time: 09/01/23 10:38 AM   Specimen: Urine, Clean Catch; Nasal Swab  Result Value Ref Range Status   MRSA  by PCR Next Gen NOT DETECTED NOT DETECTED Final    Comment: (NOTE) The GeneXpert MRSA Assay (FDA approved for NASAL specimens only), is one component of a comprehensive MRSA colonization surveillance program. It is not intended to diagnose MRSA infection nor to guide or monitor treatment for MRSA infections. Test performance is not FDA approved in patients less than 34 years old. Performed at [facility], [address] [place], [place] [address]      Radiology Studies: US Abdomen Limited RUQ (LIVER/GB) Result Date: 09/01/2023 CLINICAL DATA:  Elevated LFTs EXAM: ULTRASOUND ABDOMEN LIMITED RIGHT UPPER QUADRANT COMPARISON:  Chest CT 07/03/2023 FINDINGS: Gallbladder: Gallbladder is contracted without evidence of sludge or cholelithiasis. Evidence of gallbladder wall thickening measuring 6.6 mm. Negative sonographic Murphy sign. No pericholecystic fluid. Common bile duct: Diameter: 2.5 mm. Liver: No focal lesion identified. Within normal limits in parenchymal echogenicity. Portal vein is patent on color Doppler imaging with normal direction of blood flow towards the liver. Other: Mild perihepatic fluid. IMPRESSION: 1. Contracted gallbladder with nonspecific wall thickening. No evidence of cholelithiasis or acute cholecystitis. 2. Mild perihepatic fluid. Electronically Signed   By: Elberta Fortis M.D.   On: 09/01/2023 10:02   DG CHEST PORT 1 VIEW Result Date: 09/01/2023 CLINICAL DATA:  Acute respiratory failure with hypoxia. Cough and shortness of breath 2 weeks. Fever. EXAM: PORTABLE CHEST 1 VIEW COMPARISON:  08/31/2023 FINDINGS: Patient is slightly rotated to the right. Sternotomy wires unchanged. Left-sided pacemaker unchanged. Lungs are hypoinflated without focal airspace consolidation or effusion. Stable borderline cardiomegaly. Remainder of the exam is unchanged. IMPRESSION: 1. No active disease. 2. Stable borderline cardiomegaly. Electronically Signed   By: Elberta Fortis M.D.   On: 09/01/2023 08:01    DG Chest Port 1 View Result Date: 08/31/2023 CLINICAL DATA:  Cough, generalized weakness, short of breath for 2 weeks EXAM: PORTABLE CHEST 1 VIEW COMPARISON:  08/29/2023 FINDINGS: Single frontal view of the chest demonstrates stable multi lead pacer/AICD. Postsurgical changes from median sternotomy and aortic valve replacement. The cardiac silhouette remains enlarged. No acute airspace disease, effusion, or pneumothorax. Prior healed right rib fractures. No acute bony abnormalities. IMPRESSION: 1. Stable chest, no acute process. Electronically Signed   By: Sharlet Salina M.D.   On: 08/31/2023 18:43    Scheduled Meds:  allopurinol  200 mg Oral Daily   amiodarone  200 mg Oral Daily   atorvastatin  20 mg Oral Daily   ezetimibe  10 mg Oral Daily   furosemide  20 mg Oral Daily   [START ON 09/02/2023] loratadine  10 mg Oral Daily   [START ON 09/02/2023] metoprolol succinate  12.5 mg Oral Daily   midodrine  5 mg Oral TID WC   sodium chloride flush  3 mL Intravenous Q12H   spironolactone  25 mg Oral QHS   [START ON 09/03/2023] Vitamin D (Ergocalciferol)  50,000 Units Oral Q Tue   Warfarin - Pharmacist Dosing Inpatient   Does not apply q1600   Continuous Infusions:  ceFEPime (MAXIPIME) IV 2 g (09/01/23 1045)    LOS: 1 day   Time spent: 55 mins  Alyzae Hawkey Laural Benes, MD How to contact the [facility] Attending or Consulting provider 7A - 7P or covering provider during after hours 7P -7A, for this patient?  Check the care team in [facility] and look for a) attending/consulting TR[facility] provider listed and b) the [facility] team listed Log into [email or web address] to find provider on call.  Locate the [facility] provider you are looking for under [facility] and page to a number that you can be directly reached. If you still have difficulty reaching the provider, please page the [facility] (Director on Call) for the Hospitalists listed on amion for assistance.  09/01/2023, 12:33 PM

## 2023-09-01 NOTE — Progress Notes (Signed)
PHARMACY - ANTICOAGULATION CONSULT NOTE  Pharmacy Consult for PTA warfarin  Indication: atrial fibrillation, AVR s/p mechanical aortic valve  Allergies  Allergen Reactions   Ivp Dye [Iodinated Contrast Media] Hives    Patient Measurements: Height: 5\' 6"  (167.6 cm) Weight: 72.6 kg (160 lb) IBW/kg (Calculated) : 63.8  Vital Signs: Temp: 98.4 F (36.9 C) (01/19 0456) Temp Source: Oral (01/19 0456) BP: 108/71 (01/19 0745) Pulse Rate: 68 (01/19 0745)  Labs: Recent Labs    08/31/23 1828 08/31/23 2239 09/01/23 0547  HGB 11.3*  --  10.6*  HCT 33.9*  --  32.6*  PLT 138*  --  111*  LABPROT 31.8* 35.3* 38.1*  INR 3.1* 3.5* 3.9*  CREATININE 1.34*  --  1.49*    Estimated Creatinine Clearance: 41 mL/min (A) (by C-G formula based on SCr of 1.49 mg/dL (H)).   Medical History: Past Medical History:  Diagnosis Date   Aortic valve defect    Atrial fibrillation (HCC)    CHF (congestive heart failure) (HCC)    Clotting disorder (HCC)    Gout    Hypertension    Peripheral vascular disease (HCC)    Assessment: 44 yoM presented to ED with sepsis. PMH includes hyperlipidemia, atrial fibrillation, hx of mechanical aortic valve, PAD, and PVD. Pharmacy consulted to dose warfarin for mechanical aortic valve.  INR trended up from 3.5>3.9 this morning after dose given last night. Hemoglobin down slightly from 11.3>10.6. No bleeding issues noted. Will hold dose tonight to all INR to trend back down within goal.   Patient started on broad antibiotics with cefepime and azithromycin so they can also potentiate INR. Patient was on amiodarone prior to admit.   -PTA warfarin 3.25 mg (1/2 7.5 mg tabs) PO daily (Weekly mg 26.25 per last anticoagulant note on 1/9)  Goal of Therapy:  INR 2.5-3.5 Monitor platelets by anticoagulation protocol: Yes   Plan:  Hold warfarin tonight Daily INR  Sheppard Coil PharmD., BCPS Clinical Pharmacist 09/01/2023 8:04 AM

## 2023-09-01 NOTE — Evaluation (Signed)
Physical Therapy Evaluation Patient Details Name: Rawland Serino MRN: [id number] DOB: 07-01-1951 Today's Date: 09/01/2023  History of Present Illness  Mondarius Lanson is a 73 y.o. 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  Fianc checked his blood pressure at home it was 60 systolic prompted them to contact EMS.  They report since New Year's he has had ongoing cough with prolonged coughing spells.  Nonproductive.  Does have associated shortness of breath, rhinorrhea.  He has had decreased p.o. intake over the past few weeks.  Having progressive deconditioning and generalized weakness. Also with chills and episodes of rigors. Had CXR recently as OP with streaky opacity LLL. Not on abx outpatient   Clinical Impression  Patient functioning at baseline for functional mobility and gait demonstrating good return for ambulating in room, hallway without loss of balance or need for an AD.  Plan:  Patient discharged from physical therapy to care of nursing for ambulation daily as tolerated for length of stay.          If plan is discharge home, recommend the following: Help with stairs or ramp for entrance   Can travel by private vehicle        Equipment Recommendations None recommended by PT  Recommendations for Other Services       Functional Status Assessment Patient has not had a recent decline in their functional status     Precautions / Restrictions Precautions Precautions: None Restrictions Weight Bearing Restrictions Per Provider Order: No      Mobility  Bed Mobility Overal bed mobility: Independent                  Transfers Overall transfer level: Independent                       Ambulation/Gait Ambulation/Gait assistance: Modified independent (Device/Increase time) Gait Distance (Feet): 100 Feet Assistive device: None Gait Pattern/deviations: WFL(Within Functional Limits) Gait velocity: near normal     General Gait Details: grossly WFL with good return for ambulating in room, hallway without loss of balance or need for an AD  Stairs            Wheelchair Mobility     Tilt Bed    Modified Rankin (Stroke Patients Only)       Balance Overall balance assessment: Independent                                           Pertinent Vitals/Pain Pain Assessment Pain Assessment: No/denies pain    Home Living Family/patient expects to be discharged to:: Private residence Living Arrangements: Spouse/significant other Available Help at Discharge: Family;Available 24 hours/day Type of Home: House Home Access: Stairs to enter Entrance Stairs-Rails: Right Entrance Stairs-Number of Steps: 12   Home Layout: Two level;Able to live on main level with bedroom/bathroom;Full bath on main level Home Equipment: None      Prior Function Prior Level of Function : Independent/Modified Independent;Driving             Mobility Comments: Community ambulation without AD ADLs Comments: Independent     Extremity/Trunk Assessment   Upper Extremity Assessment Upper Extremity Assessment: Overall [facility]  for tasks assessed    Lower Extremity Assessment Lower Extremity Assessment: Overall WFL for tasks assessed    Cervical / Trunk Assessment Cervical / Trunk Assessment: Normal  Communication   Communication Communication: No apparent difficulties  Cognition Arousal: Alert Behavior During Therapy: WFL for tasks assessed/performed Overall Cognitive Status: Within Functional Limits for tasks assessed                                          General Comments      Exercises     Assessment/Plan    PT Assessment  Patient does not need any further PT services  PT Problem List         PT Treatment Interventions      PT Goals (Current goals can be found in the Care Plan section)  Acute Rehab PT Goals Patient Stated Goal: return home with family to assist PT Goal Formulation: With patient/family Time For Goal Achievement: 09/01/23 Potential to Achieve Goals: Good    Frequency       Co-evaluation               AM-PAC PT "6 Clicks" Mobility  Outcome Measure Help needed turning from your back to your side while in a flat bed without using bedrails?: None Help needed moving from lying on your back to sitting on the side of a flat bed without using bedrails?: None Help needed moving to and from a bed to a chair (including a wheelchair)?: None Help needed standing up from a chair using your arms (e.g., wheelchair or bedside chair)?: None Help needed to walk in hospital room?: None Help needed climbing 3-5 steps with a railing? : None 6 Click Score: 24    End of Session   Activity Tolerance: Patient tolerated treatment well Patient left: in chair;with family/visitor present Nurse Communication: Mobility status PT Visit Diagnosis: Unsteadiness on feet (R26.81);Other abnormalities of gait and mobility (R26.89);Muscle weakness (generalized) (M62.81)    Time: 1212-1225 PT Time Calculation (min) (ACUTE ONLY): 13 min   Charges:   PT Evaluation $PT Eval Low Complexity: 1 Low PT Treatments $Therapeutic Activity: 8-22 mins PT General Charges $$ ACUTE PT VISIT: 1 Visit         1:54 PM, 09/01/23 Ocie Bob, MPT Physical Therapist with [facility] [phone] office [phone] mobile phone

## 2023-09-01 NOTE — Progress Notes (Signed)
Update: Has interval development of minimal rales, R base. His O2 has leveled off at 8L and sat 100%. To avoid volume overload will stop his mivf. BP with borderline map, currently 62. Will start on Midodrine 5 mg TID for now. If persistent worsening hypotension can repeat lactate and may end up needing pressors.   Dolly Rias, MD  [facility]

## 2023-09-01 NOTE — Progress Notes (Addendum)
Date and time results received: 09/01/23 1400  Test: Blood Culture Critical Value: Gram + Cocci Aerobic   Name of Provider Notified: Laural Benes  Orders Received? Or Actions Taken?: Awaiting response.

## 2023-09-02 ENCOUNTER — Encounter (HOSPITAL_COMMUNITY): Payer: Self-pay | Admitting: Family Medicine

## 2023-09-02 ENCOUNTER — Other Ambulatory Visit: Payer: Self-pay

## 2023-09-02 ENCOUNTER — Inpatient Hospital Stay (HOSPITAL_COMMUNITY): Payer: Medicare Other

## 2023-09-02 DIAGNOSIS — B955 Unspecified streptococcus as the cause of diseases classified elsewhere: Secondary | ICD-10-CM | POA: Diagnosis not present

## 2023-09-02 DIAGNOSIS — R7881 Bacteremia: Secondary | ICD-10-CM

## 2023-09-02 DIAGNOSIS — I7121 Aneurysm of the ascending aorta, without rupture: Secondary | ICD-10-CM

## 2023-09-02 DIAGNOSIS — B958 Unspecified staphylococcus as the cause of diseases classified elsewhere: Secondary | ICD-10-CM

## 2023-09-02 DIAGNOSIS — I339 Acute and subacute endocarditis, unspecified: Secondary | ICD-10-CM | POA: Diagnosis present

## 2023-09-02 DIAGNOSIS — R7981 Abnormal blood-gas level: Secondary | ICD-10-CM

## 2023-09-02 DIAGNOSIS — I330 Acute and subacute infective endocarditis: Secondary | ICD-10-CM | POA: Diagnosis present

## 2023-09-02 LAB — ECHOCARDIOGRAM COMPLETE
AR max vel: 1.96 cm2
AV Area VTI: 1.63 cm2
AV Area mean vel: 1.72 cm2
AV Mean grad: 24 mm[Hg]
AV Peak grad: 35.3 mm[Hg]
Ao pk vel: 2.97 m/s
Area-P 1/2: 4.6 cm2
Calc EF: 38.5 %
Height: 66 in
S' Lateral: 4 cm
Single Plane A2C EF: 38.9 %
Single Plane A4C EF: 37.8 %
Weight: 2779.56 [oz_av]

## 2023-09-02 LAB — RENAL FUNCTION PANEL
Albumin: 2.7 g/dL — ABNORMAL LOW (ref 3.5–5.0)
Anion gap: 8 (ref 5–15)
BUN: 30 mg/dL — ABNORMAL HIGH (ref 8–23)
CO2: 19 mmol/L — ABNORMAL LOW (ref 22–32)
Calcium: 8.2 mg/dL — ABNORMAL LOW (ref 8.9–10.3)
Chloride: 106 mmol/L (ref 98–111)
Creatinine, Ser: 1.04 mg/dL (ref 0.61–1.24)
GFR, Estimated: >60 mL/min (ref >60)
Glucose, Bld: 114 mg/dL — ABNORMAL HIGH (ref 70–99)
Phosphorus: 2.3 mg/dL — ABNORMAL LOW (ref 2.5–4.6)
Potassium: 4.3 mmol/L (ref 3.5–5.1)
Sodium: 133 mmol/L — ABNORMAL LOW (ref 135–145)

## 2023-09-02 LAB — BLOOD CULTURE ID PANEL (REFLEXED) - BCID2

## 2023-09-02 LAB — CBC
HCT: 31.7 % — ABNORMAL LOW (ref 39.0–52.0)
Hemoglobin: 10.4 g/dL — ABNORMAL LOW (ref 13.0–17.0)
MCH: 32.7 pg (ref 26.0–34.0)
MCHC: 32.8 g/dL (ref 30.0–36.0)
MCV: 99.7 fL (ref 80.0–100.0)
Platelets: 135 10*3/uL — ABNORMAL LOW (ref 150–400)
RBC: 3.18 MIL/uL — ABNORMAL LOW (ref 4.22–5.81)
RDW: 14.8 % (ref 11.5–15.5)
WBC: 11.2 10*3/uL — ABNORMAL HIGH (ref 4.0–10.5)
nRBC: 0 % (ref 0.0–0.2)

## 2023-09-02 LAB — PROTIME-INR
INR: 3.6 — ABNORMAL HIGH (ref 0.8–1.2)
Prothrombin Time: 36.4 s — ABNORMAL HIGH (ref 11.4–15.2)

## 2023-09-02 LAB — PROCALCITONIN: Procalcitonin: 0.62 ng/mL

## 2023-09-02 LAB — MAGNESIUM: Magnesium: 2.1 mg/dL (ref 1.7–2.4)

## 2023-09-02 MED ORDER — SODIUM CHLORIDE 0.9 % IV SOLN
2.0000 g | Freq: Every day | INTRAVENOUS | Status: DC
  Administered 2023-09-02 – 2023-09-03 (×2): 2 g via INTRAVENOUS
  Filled 2023-09-02 (×2): qty 20

## 2023-09-02 MED ORDER — METOPROLOL SUCCINATE ER 25 MG PO TB24: 12.5000 mg | ORAL_TABLET | Freq: Every day | ORAL | Status: AC

## 2023-09-02 MED ORDER — PERFLUTREN LIPID MICROSPHERE
1.0000 mL | INTRAVENOUS | Status: AC | PRN
  Administered 2023-09-02: 2 mL via INTRAVENOUS

## 2023-09-02 MED ORDER — SODIUM CHLORIDE 0.9 % IV SOLN: 2.0000 g | Freq: Every day | INTRAVENOUS | Status: AC

## 2023-09-02 MED ORDER — SODIUM CHLORIDE 0.9 % IV SOLN
2.0000 g | Freq: Three times a day (TID) | INTRAVENOUS | Status: DC
  Administered 2023-09-02: 2 g via INTRAVENOUS

## 2023-09-02 MED ORDER — MIDODRINE HCL 5 MG PO TABS: 5.0000 mg | ORAL_TABLET | Freq: Three times a day (TID) | ORAL | Status: AC

## 2023-09-02 NOTE — Progress Notes (Signed)
  Echocardiogram 2D Echocardiogram has been performed.  Janalyn Harder 09/02/2023, 9:36 AM

## 2023-09-02 NOTE — Discharge Summary (Signed)
Physician Discharge Summary  Edward Marquez QMV:[id number] DOB: 25-Nov-1950 DOA: 08/31/2023  PCP: Murrell Converse, MD Cardiologist: Clydie Braun Admit date: 08/31/2023 Discharge date: 09/02/2023  Disposition:  TRANSFER TO [facility]   Discharge Condition: STABLE   CODE STATUS: FULL DIET: HEART    Brief Hospitalization Summary: Please see all hospital notes, images, labs for full details of the hospitalization. Admission provider HPI:  73 y.o. 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  Fianc checked his blood pressure at home it was 60 systolic prompted them to contact EMS.  They report since New Year's he has had ongoing cough with prolonged coughing spells.  Nonproductive.  Does have associated shortness of breath, rhinorrhea.  He has had decreased p.o. intake over the past few weeks.  Having progressive deconditioning and generalized weakness. Also with chills and episodes of rigors. Had CXR recently as OP with streaky opacity LLL. Not on abx outpatient.   Hospital Course by problem list   Bacterial Endocarditis  Strep Endocarditis  Strep Bacteremia  -- given his aortic valve replacement ID consult requested; TTE with positive findings of vegetations; ID recommends TEE; Pt requesting to go to [facility];  -- repeat blood cultures in AM  -- MRSA screen negative -- vancomycin discontinued -- called [facility] physician transfer line and reviewed case; they have accepted patient to medical service; I signed out care to Dr. Jarrett Soho.  -- ID placed patient on ceftriaxone IV 2 gm  -- TEE to be done at [facility] -- EP cardiology consultation at [facility] -- received call that patient has bed at [facility]  -- DC orders completed  and [place] form completed   TTE 09/02/23:  IMPRESSIONS   1. The tricuspid valve is abnormal. Tricuspid valve regurgitation is moderate. There is a device lead traversing right atrium and right ventricle. There are multiple small mobile echodensities (largest size measuring 0.7 x 0.3 cm) noted on the device lead in the right ventricle as well as right atrium. These small mobile echodensities are also seen attached to the right atrial endocardium and possibly to the septal tricuspid leaflet. Differential diagnosis:  Thrombus, vegetation. Clinical corrlation is recommended. Strongly consider TEE for further evaluation.   2. The aortic valve has been repaired/replaced. There is an unknown St. Jude mechanical valve in the aortic position. Procedure Date: 2000 at [facility]. The mechanical aortic valve leaflets are not well visualized. Hard to say if there is a mobile echodensity versus if it is the mechanical aortic valve leaflet. Aortic valve regurgitation is mild, has eccentric anteriorly directed jet. No aortic stenosis is present. Compared to Echo performed on 08/27/2023,  aortic regurgitation is new.   3. Aortic dilatation noted. There is mild dilatation of the aortic root, measuring 41 mm. Small mobile echodensity measuring 0.5 x 0.3 cm is attached to a calcified plaque in the ascending aorta. Differential diagnosis: Thrombus, vegetation. Clinical corrrelation is recommended. Strongly consider TEE for further evaluation. 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   4. No evidence of LV thrombus. Left ventricular ejection fraction, by estimation, is 35 to 40%. The left ventricle has moderately decreased function. The left ventricle demonstrates regional wall motion abnormalities (see scoring diagram/findings for description). There is moderate left ventricular hypertrophy. Left ventricular diastolic function could  not be evaluated.   5. Right ventricular systolic function is normal. The  right ventricular size is moderately enlarged. There is moderately elevated pulmonary artery systolic pressure. The estimated right ventricular systolic pressure is 50.8 mmHg.   6. Left atrial size was moderately dilated.   7. The mitral valve is normal in structure. Mild mitral valve regurgitation. No evidence of mitral stenosis.   8. The inferior vena cava is dilated in size with <50% respiratory variability, suggesting right atrial pressure of 15 mmHg.   Pneumonia --- RULED OUT  --repeated CXR with no acute findings   Hypotension - treated and resolved  History of prolonged decreased intake clinically hypovolemic.  On top of this septic from suspected pneumonia.  Had systolic in the 60s at home.  In the ED MAP borderline intermittently 60-65. - Treated with IV fluids and reduced home BP meds and started on midodrine 5 mg TID   Acute hypoxic respiratory failure - resolved  Developed acute cyanosis during episode of rigoring witnessed in the ED.  Pulse ox not picking up well, appears likely related to vasoconstriction, rigoring.  Initially on nasal cannula but due to borderline sats placed on high flow nasal cannula. - weaned down to room air oxygen    Stage 3a CKD -- creatinine holding stable, following   Acute liver injury, hepatocellular Mild elevation in transaminases AST 84, ALT 65, minimal elevation in bili 1.3, normal alk phos.  Suspect this is in the setting of sepsis.  Note of prior indeterminate hypodense liver lesion 1.2 cm incidentally seen on CT imaging outpatient. - Right upper quadrant ultrasound unrevealing of cause  -Trend LFTs   Hyponatremia, moderate, presumed chronic, asymptomatic NA 127, suspect hypovolemic with low solute - Trend BMP with IV fluids per above   Chronic HFrEF --recent TTE reviewed LVEF 35-40%  Held his home furosemide temporarily with plan to restart    S/p Mechanical aortic valve replacement  On warfarin INR goal 2.5-3.5.  Dose recently  decreased to 3.75 mg daily.  Pharmacy to dose warfarin.   History A-fib: On anticoagulation per above.  Continue amiodarone 200 mg daily.  Resumed lower dose metoprolol with holding parameters   PAD with history of bypass: Anticoagulation per above, continue home atorvastatin, Zetia   History aortic aneurysm: Outpatient surveillance   Indeterminate liver lesion: Noted on outpatient imaging, may be seen on ultrasound above.  Planning to have an MRI abdomen outpatient.   History of lung nodule: Outpatient surveillance    Discharge Diagnoses:  Principal Problem:   Bacterial endocarditis Active Problems:   CAP (community acquired pneumonia)   Streptococcal bacteremia   PVD (peripheral vascular disease) (HCC)   PAD (peripheral artery disease) (HCC)   Atrial fibrillation (HCC)   History of implantable cardioverter-defibrillator (ICD) placement   Hyperlipidemia LDL goal <70   Positive blood culture   Stage 3a chronic kidney disease (CKD) (HCC)   Acute endocarditis   Discharge Instructions:  Allergies as of 09/02/2023       Reactions   Ivp Dye [iodinated Contrast Media] Hives        Medication List     STOP taking these medications    ramipril 2.5 MG capsule Commonly known as: ALTACE       TAKE these medications    allopurinol 100 MG tablet Commonly known as: ZYLOPRIM Take 200 mg by mouth daily.   amiodarone 200 MG tablet Commonly known as: PACERONE Take 200 mg by mouth at bedtime.   atorvastatin 20 MG  tablet Commonly known as: LIPITOR TAKE 1 TABLET BY MOUTH EVERY DAY   cefTRIAXone 2 g in sodium chloride 0.9 % 100 mL Inject 2 g into the vein daily.   ergocalciferol 1.25 MG (50000 UT) capsule Commonly known as: VITAMIN D2 Take 50,000 Units by mouth every Tuesday.   ezetimibe 10 MG tablet Commonly known as: ZETIA Take 10 mg by mouth daily.   furosemide 20 MG tablet Commonly known as: LASIX Take 20 mg by mouth daily.   levocetirizine 5 MG  tablet Commonly known as: XYZAL Take 5 mg by mouth daily.   metoprolol succinate 25 MG 24 hr tablet Commonly known as: TOPROL-XL Take 0.5 tablets (12.5 mg total) by mouth daily. Take with or immediately following a meal. What changed:  medication strength how much to take   midodrine 5 MG tablet Commonly known as: PROAMATINE Take 1 tablet (5 mg total) by mouth 3 (three) times daily with meals.   spironolactone 25 MG tablet Commonly known as: ALDACTONE Take 25 mg by mouth at bedtime.   warfarin 7.5 MG tablet Commonly known as: COUMADIN Take 7.5 mg by mouth at bedtime.        Allergies  Allergen Reactions   Ivp Dye [Iodinated Contrast Media] Hives   Allergies as of 09/02/2023       Reactions   Ivp Dye [iodinated Contrast Media] Hives        Medication List     STOP taking these medications    ramipril 2.5 MG capsule Commonly known as: ALTACE       TAKE these medications    allopurinol 100 MG tablet Commonly known as: ZYLOPRIM Take 200 mg by mouth daily.   amiodarone 200 MG tablet Commonly known as: PACERONE Take 200 mg by mouth at bedtime.   atorvastatin 20 MG tablet Commonly known as: LIPITOR TAKE 1 TABLET BY MOUTH EVERY DAY   cefTRIAXone 2 g in sodium chloride 0.9 % 100 mL Inject 2 g into the vein daily.   ergocalciferol 1.25 MG (50000 UT) capsule Commonly known as: VITAMIN D2 Take 50,000 Units by mouth every Tuesday.   ezetimibe 10 MG tablet Commonly known as: ZETIA Take 10 mg by mouth daily.   furosemide 20 MG tablet Commonly known as: LASIX Take 20 mg by mouth daily.   levocetirizine 5 MG tablet Commonly known as: XYZAL Take 5 mg by mouth daily.   metoprolol succinate 25 MG 24 hr tablet Commonly known as: TOPROL-XL Take 0.5 tablets (12.5 mg total) by mouth daily. Take with or immediately following a meal. What changed:  medication strength how much to take   midodrine 5 MG tablet Commonly known as: PROAMATINE Take 1 tablet  (5 mg total) by mouth 3 (three) times daily with meals.   spironolactone 25 MG tablet Commonly known as: ALDACTONE Take 25 mg by mouth at bedtime.   warfarin 7.5 MG tablet Commonly known as: COUMADIN Take 7.5 mg by mouth at bedtime.        Procedures/Studies: ECHOCARDIOGRAM COMPLETE Result Date: 09/02/2023    ECHOCARDIOGRAM REPORT   Patient Name:   Edward Marquez Date of Exam: 09/02/2023 Medical Rec #:  [id number]     Height:       66.0 in Accession #:    [id number]    Weight:       173.7 lb Date of Birth:  1950/10/28      BSA:          1.884 m Patient Age:  72 years      BP:           110/63 mmHg Patient Gender: M             HR:           65 bpm. [facility] Procedure: 2D Echo, Cardiac Doppler, Color Doppler and Intracardiac            Opacification Agent Indications:    Bacteremia  History:        Patient has no prior history of Echocardiogram examinations.                 Abnormal ECG and Defibrillator, Aortic Valve Disease,                 Arrythmias:Atrial Fibrillation, Signs/Symptoms:Bacteremia; Risk                 Factors:Dyslipidemia. Aortic stenosis. AVR.                 Aortic Valve: unknown St. Jude mechanical valve is present in                 the aortic position. Procedure Date: 2000 at [facility].  Sonographer:    Sheralyn Boatman RDCS Referring Phys: [id number] [facility] IMPRESSIONS  1. The tricuspid valve is abnormal. Tricuspid valve regurgitation is moderate. There is a device lead traversing right atrium and right ventricle. There are multiple small mobile echodensities (largest size measuring 0.7 x 0.3 cm) noted on the device lead in the right ventricle as well as right atrium. These small mobile echodensities are also seen attached to the right atrial endocardium and possibly to the septal tricuspid leaflet. Differential diagnosis: Thrombus, vegetation. Clinical corrlation is recommended. Strongly consider TEE for further evaluation.  2. The aortic valve has been  repaired/replaced. There is an unknown St. Jude mechanical valve in the aortic position. Procedure Date: 2000 at [facility]. The mechanical aortic valve leaflets are not well visualized. Hard to say if there is a mobile echodensity versus if it is the mechanical aortic valve leaflet. Aortic valve regurgitation is mild, has eccentric anteriorly directed jet. No aortic stenosis is present. Compared to Echo performed on 08/27/2023, aortic regurgitation is new.  3. Aortic dilatation noted. There is mild dilatation of the aortic root, measuring 41 mm. Small mobile echodensity measuring 0.5 x 0.3 cm is attached to a calcified plaque in the ascending aorta. Differential diagnosis: Thrombus, vegetation. Clinical corrrelation is recommended. Strongly consider TEE for further evaluation. 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  4. No evidence of LV thrombus. Left ventricular ejection fraction, by estimation, is 35 to 40%. The left ventricle has moderately decreased function. The left ventricle demonstrates regional wall motion abnormalities (see scoring diagram/findings for description). There is moderate left ventricular hypertrophy. Left ventricular diastolic function could not be evaluated.  5. Right ventricular systolic function is normal. The right ventricular size is moderately enlarged. There is moderately elevated pulmonary artery systolic pressure. The estimated right ventricular systolic pressure is 50.8 mmHg.  6. Left atrial size was moderately dilated.  7. The mitral valve is normal in structure. Mild mitral valve regurgitation. No evidence of mitral stenosis.  8. The inferior vena cava is dilated in size with <50% respiratory variability, suggesting right atrial pressure of 15 mmHg. Comparison(s): Changes from prior study are noted. Multiple mobile echodensities on the device lead, right atrial endocardium, septal tricuspid leaflet, possibly on the mechanical aortic  valve and ascending  aorta. Mild eccentric aortic regurgitation is new. FINDINGS  Left Ventricle: No evidence of LV thrombus. Left ventricular ejection fraction, by estimation, is 35 to 40%. The left ventricle has moderately decreased function. The left ventricle demonstrates regional wall motion abnormalities. Definity contrast agent was given IV to delineate the left ventricular endocardial borders. The left ventricular internal cavity size was normal in size. There is moderate left ventricular hypertrophy. Left ventricular diastolic function could not be evaluated due to paced rhythm. Left ventricular diastolic function could not be evaluated.  LV Wall Scoring: The apex is akinetic. The antero-lateral wall, entire inferior wall, and apical lateral segment are hypokinetic. The entire anterior wall and inferior septum are normal. Right Ventricle: The right ventricular size is moderately enlarged. No increase in right ventricular wall thickness. Right ventricular systolic function is normal. There is moderately elevated pulmonary artery systolic pressure. The tricuspid regurgitant  velocity is 2.99 m/s, and with an assumed right atrial pressure of 15 mmHg, the estimated right ventricular systolic pressure is 50.8 mmHg. Left Atrium: Left atrial size was moderately dilated. Right Atrium: Right atrial size was normal in size. Pericardium: There is no evidence of pericardial effusion. Mitral Valve: The mitral valve is normal in structure. Mild mitral valve regurgitation. No evidence of mitral valve stenosis. Tricuspid Valve: The tricuspid valve is abnormal. Tricuspid valve regurgitation is moderate . No evidence of tricuspid stenosis. Aortic Valve: The aortic valve has been repaired/replaced. Aortic valve regurgitation is mild, eccentric anteriorly directed jet is noted. No aortic stenosis is present. Aortic valve mean gradient measures 24.0 mmHg. Aortic valve peak gradient measures 35.3 mmHg. Aortic valve area, by VTI measures 1.63 cm.  There is a unknown St. Jude mechanical valve present in the aortic position. Procedure Date: 2000 at [facility]. Pulmonic Valve: The pulmonic valve was not well visualized. Pulmonic valve regurgitation is trivial. No evidence of pulmonic stenosis. Aorta: Aortic dilatation noted. There is mild dilatation of the aortic root, measuring 41 mm. 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 Venous: The inferior vena cava is dilated in size with less than 50% respiratory variability, suggesting right atrial pressure of 15 mmHg. IAS/Shunts: No atrial level shunt detected by color flow Doppler. Additional Comments: A device lead is visualized.  LEFT VENTRICLE PLAX 2D LVIDd:         5.10 cm      Diastology LVIDs:         4.00 cm      LV e' medial:    5.77 cm/s LV PW:         1.50 cm      LV E/e' medial:  19.6 LV IVS:        1.30 cm      LV e' lateral:   16.60 cm/s LVOT diam:     2.40 cm      LV E/e' lateral: 6.8 LV SV:         95 LV SV Index:   51 LVOT Area:     4.52 cm  LV Volumes (MOD) LV vol d, MOD A2C: 115.0 ml LV vol d, MOD A4C: 121.0 ml LV vol s, MOD A2C: 70.3 ml LV vol s, MOD A4C: 75.3 ml LV SV MOD A2C:     44.7 ml LV SV MOD A4C:     121.0 ml LV SV MOD BP:      46.1 ml RIGHT VENTRICLE  IVC RV S prime:     5.33 cm/s  IVC diam: 2.30 cm TAPSE (M-mode): 1.2 cm LEFT ATR[facility]             Index        RIGHT ATR[facility]UM           Index LA diam:        4.70 cm 2.50 cm/m   RA Area:     18.30 cm LA Vol (A2C):   86.8 ml 46.08 ml/m  RA Volume:   52.70 ml  27.98 ml/m LA Vol (A4C):   84.4 ml 44.81 ml/m LA Biplane Vol: 85.8 ml 45.55 ml/m  AORTIC VALVE AV Area (Vmax):    1.96 cm AV Area (Vmean):   1.72 cm AV Area (VTI):     1.63 cm AV Vmax:           297.00 cm/s AV Vmean:          206.250 cm/s AV VTI:            0.587 m AV Peak Grad:      35.3 mmHg AV Mean Grad:      24.0 mmHg LVOT Vmax:         129.00 cm/s LVOT Vmean:        78.500 cm/s LVOT VTI:          0.211 m LVOT/AV VTI ratio: 0.36  AORTA Ao Root  diam: 4.10 cm Ao Asc diam:  4.50 cm MITRAL VALVE                TRICUSPID VALVE MV Area (PHT): 4.60 cm     TV Peak grad:   7.4 mmHg MV Decel Time: 165 msec     TV Mean grad:   2.0 mmHg MV E velocity: 113.00 cm/s  TV Vmax:        1.36 m/s                             TV Vmean:       70.1 cm/s                             TV VTI:         0.41 msec                             TR Peak grad:   35.8 mmHg                             TR Vmax:        299.00 cm/s                              SHUNTS                             Systemic VTI:  0.21 m                             Systemic Diam: 2.40 cm Vishnu Priya Mallipeddi Electronically signed by Winfield Rast Mallipeddi Signature Date/Time: 09/02/2023/2:48:14 PM    Final    [place] EKG SITE RITE Result Date: 09/02/2023 If [facility]  image not attached, placement could not be confirmed due to current cardiac rhythm.  US Abdomen Limited RUQ (LIVER/GB) Result Date: 09/01/2023 CLINICAL DATA:  Elevated LFTs EXAM: ULTRASOUND ABDOMEN LIMITED RIGHT UPPER QUADRANT COMPARISON:  Chest CT 07/03/2023 FINDINGS: Gallbladder: Gallbladder is contracted without evidence of sludge or cholelithiasis. Evidence of gallbladder wall thickening measuring 6.6 mm. Negative sonographic Murphy sign. No pericholecystic fluid. Common bile duct: Diameter: 2.5 mm. Liver: No focal lesion identified. Within normal limits in parenchymal echogenicity. Portal vein is patent on color Doppler imaging with normal direction of blood flow towards the liver. Other: Mild perihepatic fluid. IMPRESSION: 1. Contracted gallbladder with nonspecific wall thickening. No evidence of cholelithiasis or acute cholecystitis. 2. Mild perihepatic fluid. Electronically Signed   By: Elberta Fortis M.D.   On: 09/01/2023 10:02   DG CHEST PORT 1 VIEW Result Date: 09/01/2023 CLINICAL DATA:  Acute respiratory failure with hypoxia. Cough and shortness of breath 2 weeks. Fever. EXAM: PORTABLE CHEST 1 VIEW COMPARISON:  08/31/2023 FINDINGS:  Patient is slightly rotated to the right. Sternotomy wires unchanged. Left-sided pacemaker unchanged. Lungs are hypoinflated without focal airspace consolidation or effusion. Stable borderline cardiomegaly. Remainder of the exam is unchanged. IMPRESSION: 1. No active disease. 2. Stable borderline cardiomegaly. Electronically Signed   By: Elberta Fortis M.D.   On: 09/01/2023 08:01   DG Chest Port 1 View Result Date: 08/31/2023 CLINICAL DATA:  Cough, generalized weakness, short of breath for 2 weeks EXAM: PORTABLE CHEST 1 VIEW COMPARISON:  08/29/2023 FINDINGS: Single frontal view of the chest demonstrates stable multi lead pacer/AICD. Postsurgical changes from median sternotomy and aortic valve replacement. The cardiac silhouette remains enlarged. No acute airspace disease, effusion, or pneumothorax. Prior healed right rib fractures. No acute bony abnormalities. IMPRESSION: 1. Stable chest, no acute process. Electronically Signed   By: Sharlet Salina M.D.   On: 08/31/2023 18:43     Subjective: Pt reports if he has to transfer he wants to go to [facility] where his heart doctors are located.  He says he is feeling better since being on antibiotics.   Discharge Exam: Vitals:   09/02/23 0835 09/02/23 1325  BP: 104/62 112/61  Pulse: 64 63  Resp:  17  Temp:  97.7 F (36.5 C)  SpO2: 98% 99%   Vitals:   09/01/23 2035 09/02/23 0458 09/02/23 0835 09/02/23 1325  BP: 114/77 110/63 104/62 112/61  Pulse: 65 65 64 63  Resp: 20 20  17   Temp: 98.1 F (36.7 C) 98 F (36.7 C)  97.7 F (36.5 C)  TempSrc: Oral Oral  Oral  SpO2: 100% 96% 98% 99%  Weight:      Height:       General: Pt is alert, awake, not in acute distress Cardiovascular: normal S1/S2 +, click murmur, no rubs, no gallops Respiratory: CTA bilaterally, no wheezing, no rhonchi Abdominal: Soft, NT, ND, bowel sounds + Extremities: no edema, no cyanosis   The results of significant diagnostics from this hospitalization (including imaging,  microbiology, ancillary and laboratory) are listed below for reference.     Microbiology: Recent Results (from the past 240 hours)  Resp panel by RT-PCR (RSV, Flu A&B, Covid) Anterior Nasal Swab     Status: None   Collection Time: 08/31/23  6:13 PM   Specimen: Anterior Nasal Swab  Result Value Ref Range Status   SARS Coronavirus 2 by RT PCR NEGATIVE NEGATIVE Final    Comment: (NOTE) SARS-CoV-2 target nucleic acids are NOT DETECTED.  The SARS-CoV-2 RNA is generally detectable  in upper respiratory specimens during the acute phase of infection. The lowest concentration of SARS-CoV-2 viral copies this assay can detect is 138 copies/mL. A negative result does not preclude SARS-Cov-2 infection and should not be used as the sole basis for treatment or other patient management decisions. A negative result may occur with  improper specimen collection/handling, submission of specimen other than nasopharyngeal swab, presence of viral mutation(s) within the areas targeted by this assay, and inadequate number of viral copies(<138 copies/mL). A negative result must be combined with clinical observations, patient history, and epidemiological information. The expected result is Negative.  Fact Sheet for Patients:  BloggerCourse.com  Fact Sheet for Healthcare Providers:  SeriousBroker.it  This test is no t yet approved or cleared by the [place] FDA and  has been authorized for detection and/or diagnosis of SARS-CoV-2 by FDA under an Emergency Use Authorization (EUA). This EUA will remain  in effect (meaning this test can be used) for the duration of the COVID-19 declaration under Section 564(b)(1) of the Act, 21 U.S.C.section 360bbb-3(b)(1), unless the authorization is terminated  or revoked sooner.       Influenza A by PCR NEGATIVE NEGATIVE Final   Influenza B by PCR NEGATIVE NEGATIVE Final    Comment: (NOTE) The Xpert Xpress  SARS-CoV-2/FLU/RSV plus assay is intended as an aid in the diagnosis of influenza from Nasopharyngeal swab specimens and should not be used as a sole basis for treatment. Nasal washings and aspirates are unacceptable for Xpert Xpress SARS-CoV-2/FLU/RSV testing.  Fact Sheet for Patients: BloggerCourse.com  Fact Sheet for Healthcare Providers: SeriousBroker.it  This test is not yet approved or cleared by the [place] FDA and has been authorized for detection and/or diagnosis of SARS-CoV-2 by FDA under an Emergency Use Authorization (EUA). This EUA will remain in effect (meaning this test can be used) for the duration of the COVID-19 declaration under Section 564(b)(1) of the Act, 21 U.S.C. section 360bbb-3(b)(1), unless the authorization is terminated or revoked.     Resp Syncytial Virus by PCR NEGATIVE NEGATIVE Final    Comment: (NOTE) Fact Sheet for Patients: BloggerCourse.com  Fact Sheet for Healthcare Providers: SeriousBroker.it  This test is not yet approved or cleared by the [place] FDA and has been authorized for detection and/or diagnosis of SARS-CoV-2 by FDA under an Emergency Use Authorization (EUA). This EUA will remain in effect (meaning this test can be used) for the duration of the COVID-19 declaration under Section 564(b)(1) of the Act, 21 U.S.C. section 360bbb-3(b)(1), unless the authorization is terminated or revoked.  Performed at [facility], [address], [place], [place] [address]   Blood culture (routine x 2)     Status: Abnormal (Preliminary result)   Collection Time: 08/31/23  7:59 PM   Specimen: Left Antecubital; Blood  Result Value Ref Range Status   Specimen Description   Final    LEFT ANTECUBITAL Performed at [facility], [address], [place], [place] [address]    Special Requests   Final    Normal Performed at [facility], [address], [place], [place] [address]    Culture  Setup Time   Final    GRAM POSITIVE COCCI IN BOTH AEROBIC AND ANAEROBIC BOTTLES Gram Stain Report Called to,Read Back By and Verified With: CHRYSTAL R. ON 09/01/2023 @11 :15 BY T.HAMER. Organism ID to follow CRITICAL RESULT CALLED TO, READ BACK BY AND VERIFIED WITH: M ATKINS,RN@[id number]  09/02/23 MK Performed at [facility], [address], [place], [place]  [address]    Culture STREPTOCOCCUS MITIS/ORALIS (A)  Final   Report Status PENDING  Incomplete  Blood Culture ID Panel (Reflexed)     Status: Abnormal   Collection Time: 08/31/23  7:59 PM  Result Value Ref Range Status   Enterococcus faecalis NOT DETECTED NOT DETECTED Final   Enterococcus Faecium NOT DETECTED NOT DETECTED Final   Listeria monocytogenes NOT DETECTED NOT DETECTED Final   Staphylococcus species NOT DETECTED NOT DETECTED Final   Staphylococcus aureus (BCID) NOT DETECTED NOT DETECTED Final   Staphylococcus epidermidis NOT DETECTED NOT DETECTED Final   Staphylococcus lugdunensis NOT DETECTED NOT DETECTED Final   Streptococcus species DETECTED (A) NOT DETECTED Final    Comment: Not Enterococcus species, Streptococcus agalactiae, Streptococcus pyogenes, or Streptococcus pneumoniae. CRITICAL RESULT CALLED TO, READ BACK BY AND VERIFIED WITH: M ATKINS,RN@0255  09/02/23 MK    Streptococcus agalactiae NOT DETECTED NOT DETECTED Final   Streptococcus pneumoniae NOT DETECTED NOT DETECTED Final   Streptococcus pyogenes NOT DETECTED NOT DETECTED Final   A.calcoaceticus-baumannii NOT DETECTED NOT DETECTED Final   Bacteroides fragilis NOT DETECTED NOT DETECTED Final   Enterobacterales NOT DETECTED NOT DETECTED Final   Enterobacter cloacae complex NOT DETECTED NOT DETECTED Final   Escherichia coli NOT DETECTED NOT DETECTED Final   Klebsiella aerogenes NOT DETECTED NOT DETECTED Final   Klebsiella oxytoca NOT DETECTED NOT DETECTED Final   Klebsiella pneumoniae NOT DETECTED NOT  DETECTED Final   Proteus species NOT DETECTED NOT DETECTED Final   Salmonella species NOT DETECTED NOT DETECTED Final   Serratia marcescens NOT DETECTED NOT DETECTED Final   Haemophilus influenzae NOT DETECTED NOT DETECTED Final   Neisseria meningitidis NOT DETECTED NOT DETECTED Final   Pseudomonas aeruginosa NOT DETECTED NOT DETECTED Final   Stenotrophomonas maltophilia NOT DETECTED NOT DETECTED Final   Candida albicans NOT DETECTED NOT DETECTED Final   Candida auris NOT DETECTED NOT DETECTED Final   Candida glabrata NOT DETECTED NOT DETECTED Final   Candida krusei NOT DETECTED NOT DETECTED Final   Candida parapsilosis NOT DETECTED NOT DETECTED Final   Candida tropicalis NOT DETECTED NOT DETECTED Final   Cryptococcus neoformans/gattii NOT DETECTED NOT DETECTED Final    Comment: Performed at [facility], [address], [place], [place] [address]  Blood culture (routine x 2)     Status: Abnormal (Preliminary result)   Collection Time: 08/31/23  8:13 PM   Specimen: BLOOD LEFT HAND  Result Value Ref Range Status   Specimen Description   Final    BLOOD LEFT HAND Performed at [facility], [address], [place], [place] [address]    Special Requests   Final    Normal Performed at [facility], [address], [place], [place] [address]    Culture  Setup Time   Final    GRAM POSITIVE COCCI AEROBIC BOTTLE ONLY Gram Stain Report Called to,Read Back By and Verified With: BRITTANY R. ON 09/01/2023 @13 :52 BY T.HAMER.  CRITICAL VALUE NOTED.  VALUE IS CONSISTENT WITH PREVIOUSLY REPORTED AND CALLED VALUE. Organism ID to follow    Culture (A)  Final    STREPTOCOCCUS MITIS/ORALIS CULTURE REINCUBATED FOR BETTER GROWTH Performed at [facility], [address], [place], [place] [address]    Report Status PENDING  Incomplete  Respiratory (~20 pathogens) panel by PCR     Status: None   Collection Time: 09/01/23  9:26 AM   Specimen: Nasopharyngeal Swab; Respiratory  Result  Value Ref Range Status   Adenovirus NOT  DETECTED NOT DETECTED Final   Coronavirus 229E NOT DETECTED NOT DETECTED Final    Comment: (NOTE) The Coronavirus on the Respiratory Panel, DOES NOT test for the novel  Coronavirus (2019 nCoV)    Coronavirus HKU1 NOT DETECTED NOT DETECTED Final   Coronavirus NL63 NOT DETECTED NOT DETECTED Final   Coronavirus OC43 NOT DETECTED NOT DETECTED Final   Metapneumovirus NOT DETECTED NOT DETECTED Final   Rhinovirus / Enterovirus NOT DETECTED NOT DETECTED Final   Influenza A NOT DETECTED NOT DETECTED Final   Influenza B NOT DETECTED NOT DETECTED Final   Parainfluenza Virus 1 NOT DETECTED NOT DETECTED Final   Parainfluenza Virus 2 NOT DETECTED NOT DETECTED Final   Parainfluenza Virus 3 NOT DETECTED NOT DETECTED Final   Parainfluenza Virus 4 NOT DETECTED NOT DETECTED Final   Respiratory Syncytial Virus NOT DETECTED NOT DETECTED Final   Bordetella pertussis NOT DETECTED NOT DETECTED Final   Bordetella Parapertussis NOT DETECTED NOT DETECTED Final   Chlamydophila pneumoniae NOT DETECTED NOT DETECTED Final   Mycoplasma pneumoniae NOT DETECTED NOT DETECTED Final    Comment: Performed at [facility], [address], [place], [place] [address]  MRSA Next Gen by PCR, Nasal     Status: None   Collection Time: 09/01/23 10:38 AM   Specimen: Urine, Clean Catch; Nasal Swab  Result Value Ref Range Status   MRSA by PCR Next Gen NOT DETECTED NOT DETECTED Final    Comment: (NOTE) The GeneXpert MRSA Assay (FDA approved for NASAL specimens only), is one component of a comprehensive MRSA colonization surveillance program. It is not intended to diagnose MRSA infection nor to guide or monitor treatment for MRSA infections. Test performance is not FDA approved in patients less than 29 years old. Performed at [facility], [address], [place], [place] [address]   Culture, blood (Routine X 2) w Reflex to ID Panel     Status: None (Preliminary result)   Collection  Time: 09/02/23  6:35 AM   Specimen: Left Antecubital; Blood  Result Value Ref Range Status   Specimen Description   Final    LEFT ANTECUBITAL BOTTLES DRAWN AEROBIC AND ANAEROBIC   Special Requests Blood Culture adequate volume  Final   Culture   Final    NO GROWTH <12 HOURS Performed at [facility], [address], [place], [place] [address]    Report Status PENDING  Incomplete  Culture, blood (Routine X 2) w Reflex to ID Panel     Status: None (Preliminary result)   Collection Time: 09/02/23  6:42 AM   Specimen: BLOOD LEFT FOREARM  Result Value Ref Range Status   Specimen Description   Final    BLOOD LEFT FOREARM BOTTLES DRAWN AEROBIC AND ANAEROBIC   Special Requests   Final    Blood Culture results may not be optimal due to an inadequate volume of blood received in culture bottles   Culture   Final    NO GROWTH <12 HOURS Performed at [facility], [address], [place], [place] [address]    Report Status PENDING  Incomplete     Labs: BNP (last 3 results) Recent Labs    08/31/23 1828  BNP 786.0*   Basic Metabolic Panel: Recent Labs  Lab 08/31/23 1828 09/01/23 0547 09/02/23 0642  NA 127* 131* 133*  K 4.7 4.6 4.3  CL 101 103 106  CO2 20* 19* 19*  GLUCOSE 138* 226* 114*  BUN 36* 41* 30*  CREATININE 1.34* 1.49* 1.04  CALCIUM 8.3* 7.7* 8.2*  MG  --  2.3 2.1  PHOS  --  2.6 2.3*   Liver Function Tests: Recent Labs  Lab 08/31/23 1828 09/02/23 0642  AST 84*  --   ALT 65*  --   ALKPHOS 100  --   BILITOT 1.3*  --   PROT 7.4  --   ALBUMIN 3.2* 2.7*   No results for input(s): "LIPASE", "AMYLASE" in the last 168 hours. No results for input(s): "AMMONIA" in the last 168 hours. CBC: Recent Labs  Lab 08/31/23 1828 09/01/23 0547 09/02/23 0642  WBC 13.7* 16.1* 11.2*  NEUTROABS 12.1*  --   --   HGB 11.3* 10.6* 10.4*  HCT 33.9* 32.6* 31.7*  MCV 97.4 98.8 99.7  PLT 138* 111* 135*   Cardiac Enzymes: No results for input(s): "CKTOTAL", "CKMB", "CKMBINDEX",  "TROPONINI" in the last 168 hours. BNP: Invalid input(s): "POCBNP" CBG: No results for input(s): "GLUCAP" in the last 168 hours. D-Dimer No results for input(s): "DDIMER" in the last 72 hours. Hgb A1c No results for input(s): "HGBA1C" in the last 72 hours. Lipid Profile No results for input(s): "CHOL", "HDL", "LDLCALC", "TRIG", "CHOLHDL", "LDLDIRECT" in the last 72 hours. Thyroid function studies No results for input(s): "TSH", "T4TOTAL", "T3FREE", "THYROIDAB" in the last 72 hours.  Invalid input(s): "FREET3" Anemia work up No results for input(s): "VITAMINB12", "FOLATE", "FERRITIN", "TIBC", "IRON", "RETICCTPCT" in the last 72 hours. Urinalysis    Component Value Date/Time   COLORURINE YELLOW 09/01/2023 1030   APPEARANCEUR CLEAR 09/01/2023 1030   LABSPEC 1.015 09/01/2023 1030   PHURINE 5.0 09/01/2023 1030   GLUCOSEU 50 (A) 09/01/2023 1030   HGBUR NEGATIVE 09/01/2023 1030   BILIRUBINUR NEGATIVE 09/01/2023 1030   KETONESUR NEGATIVE 09/01/2023 1030   PROTEINUR NEGATIVE 09/01/2023 1030   NITRITE NEGATIVE 09/01/2023 1030   LEUKOCYTESUR NEGATIVE 09/01/2023 1030   Sepsis Labs Recent Labs  Lab 08/31/23 1828 09/01/23 0547 09/02/23 0642  WBC 13.7* 16.1* 11.2*   Microbiology Recent Results (from the past 240 hours)  Resp panel by RT-PCR (RSV, Flu A&B, Covid) Anterior Nasal Swab     Status: None   Collection Time: 08/31/23  6:13 PM   Specimen: Anterior Nasal Swab  Result Value Ref Range Status   SARS Coronavirus 2 by RT PCR NEGATIVE NEGATIVE Final    Comment: (NOTE) SARS-CoV-2 target nucleic acids are NOT DETECTED.  The SARS-CoV-2 RNA is generally detectable in upper respiratory specimens during the acute phase of infection. The lowest concentration of SARS-CoV-2 viral copies this assay can detect is 138 copies/mL. A negative result does not preclude SARS-Cov-2 infection and should not be used as the sole basis for treatment or other patient management decisions. A negative  result may occur with  improper specimen collection/handling, submission of specimen other than nasopharyngeal swab, presence of viral mutation(s) within the areas targeted by this assay, and inadequate number of viral copies(<138 copies/mL). A negative result must be combined with clinical observations, patient history, and epidemiological information. The expected result is Negative.  Fact Sheet for Patients:  BloggerCourse.com  Fact Sheet for Healthcare Providers:  SeriousBroker.it  This test is no t yet approved or cleared by the [place] FDA and  has been authorized for detection and/or diagnosis of SARS-CoV-2 by FDA under an Emergency Use Authorization (EUA). This EUA will remain  in effect (meaning this test can be used) for the duration of the COVID-19 declaration under Section 564(b)(1) of the Act, 21 U.S.C.section 360bbb-3(b)(1), unless the authorization is  terminated  or revoked sooner.       Influenza A by PCR NEGATIVE NEGATIVE Final   Influenza B by PCR NEGATIVE NEGATIVE Final    Comment: (NOTE) The Xpert Xpress SARS-CoV-2/FLU/RSV plus assay is intended as an aid in the diagnosis of influenza from Nasopharyngeal swab specimens and should not be used as a sole basis for treatment. Nasal washings and aspirates are unacceptable for Xpert Xpress SARS-CoV-2/FLU/RSV testing.  Fact Sheet for Patients: BloggerCourse.com  Fact Sheet for Healthcare Providers: SeriousBroker.it  This test is not yet approved or cleared by the [place] FDA and has been authorized for detection and/or diagnosis of SARS-CoV-2 by FDA under an Emergency Use Authorization (EUA). This EUA will remain in effect (meaning this test can be used) for the duration of the COVID-19 declaration under Section 564(b)(1) of the Act, 21 U.S.C. section 360bbb-3(b)(1), unless the authorization is  terminated or revoked.     Resp Syncytial Virus by PCR NEGATIVE NEGATIVE Final    Comment: (NOTE) Fact Sheet for Patients: BloggerCourse.com  Fact Sheet for Healthcare Providers: SeriousBroker.it  This test is not yet approved or cleared by the [place] FDA and has been authorized for detection and/or diagnosis of SARS-CoV-2 by FDA under an Emergency Use Authorization (EUA). This EUA will remain in effect (meaning this test can be used) for the duration of the COVID-19 declaration under Section 564(b)(1) of the Act, 21 U.S.C. section 360bbb-3(b)(1), unless the authorization is terminated or revoked.  Performed at [facility], [address] [place], [place] [address]   Blood culture (routine x 2)     Status: Abnormal (Preliminary result)   Collection Time: 08/31/23  7:59 PM   Specimen: Left Antecubital; Blood  Result Value Ref Range Status   Specimen Description   Final    LEFT ANTECUBITAL Performed at [facility], [address], [place], [place] [address]    Special Requests   Final    Normal Performed at [facility], [address] [place], [place] [address]    Culture  Setup Time   Final    GRAM POSITIVE COCCI IN BOTH AEROBIC AND ANAEROBIC BOTTLES Gram Stain Report Called to,Read Back By and Verified With: CHRYSTAL R. ON 09/01/2023 @11 :15 BY T.HAMER. Organism ID to follow CRITICAL RESULT CALLED TO, READ BACK BY AND VERIFIED WITH: M ATKINS,RN@[id number]  09/02/23 MK Performed at [facility], [address], [place], [place] [address]    Culture STREPTOCOCCUS MITIS/ORALIS (A)  Final   Report Status PENDING  Incomplete  Blood Culture ID Panel (Reflexed)     Status: Abnormal   Collection Time: 08/31/23  7:59 PM  Result Value Ref Range Status   Enterococcus faecalis NOT DETECTED NOT DETECTED Final   Enterococcus Faecium NOT DETECTED NOT DETECTED Final   Listeria monocytogenes NOT DETECTED NOT DETECTED Final    Staphylococcus species NOT DETECTED NOT DETECTED Final   Staphylococcus aureus (BCID) NOT DETECTED NOT DETECTED Final   Staphylococcus epidermidis NOT DETECTED NOT DETECTED Final   Staphylococcus lugdunensis NOT DETECTED NOT DETECTED Final   Streptococcus species DETECTED (A) NOT DETECTED Final    Comment: Not Enterococcus species, Streptococcus agalactiae, Streptococcus pyogenes, or Streptococcus pneumoniae. CRITICAL RESULT CALLED TO, READ BACK BY AND VERIFIED WITH: M ATKINS,RN@0255  09/02/23 MK    Streptococcus agalactiae NOT DETECTED NOT DETECTED Final   Streptococcus pneumoniae NOT DETECTED NOT DETECTED Final   Streptococcus pyogenes NOT DETECTED NOT DETECTED Final   A.calcoaceticus-baumannii NOT DETECTED NOT DETECTED Final   Bacteroides fragilis  NOT DETECTED NOT DETECTED Final   Enterobacterales NOT DETECTED NOT DETECTED Final   Enterobacter cloacae complex NOT DETECTED NOT DETECTED Final   Escherichia coli NOT DETECTED NOT DETECTED Final   Klebsiella aerogenes NOT DETECTED NOT DETECTED Final   Klebsiella oxytoca NOT DETECTED NOT DETECTED Final   Klebsiella pneumoniae NOT DETECTED NOT DETECTED Final   Proteus species NOT DETECTED NOT DETECTED Final   Salmonella species NOT DETECTED NOT DETECTED Final   Serratia marcescens NOT DETECTED NOT DETECTED Final   Haemophilus influenzae NOT DETECTED NOT DETECTED Final   Neisseria meningitidis NOT DETECTED NOT DETECTED Final   Pseudomonas aeruginosa NOT DETECTED NOT DETECTED Final   Stenotrophomonas maltophilia NOT DETECTED NOT DETECTED Final   Candida albicans NOT DETECTED NOT DETECTED Final   Candida auris NOT DETECTED NOT DETECTED Final   Candida glabrata NOT DETECTED NOT DETECTED Final   Candida krusei NOT DETECTED NOT DETECTED Final   Candida parapsilosis NOT DETECTED NOT DETECTED Final   Candida tropicalis NOT DETECTED NOT DETECTED Final   Cryptococcus neoformans/gattii NOT DETECTED NOT DETECTED Final    Comment: Performed at [facility], [address] [place], [place] [address]  Blood culture (routine x 2)     Status: Abnormal (Preliminary result)   Collection Time: 08/31/23  8:13 PM   Specimen: BLOOD LEFT HAND  Result Value Ref Range Status   Specimen Description   Final    BLOOD LEFT HAND Performed at [facility], [address], [place], [place] [address]    Special Requests   Final    Normal Performed at [facility], [address], [place], [place] [address]    Culture  Setup Time   Final    GRAM POSITIVE COCCI AEROBIC BOTTLE ONLY Gram Stain Report Called to,Read Back By and Verified With: BRITTANY R. ON 09/01/2023 @13 :52 BY T.HAMER.  CRITICAL VALUE NOTED.  VALUE IS CONSISTENT WITH PREVIOUSLY REPORTED AND CALLED VALUE. Organism ID to follow    Culture (A)  Final    STREPTOCOCCUS MITIS/ORALIS CULTURE REINCUBATED FOR BETTER GROWTH Performed at [facility], [address], [place], [place] [address]    Report Status PENDING  Incomplete  Respiratory (~20 pathogens) panel by PCR     Status: None   Collection Time: 09/01/23  9:26 AM   Specimen: Nasopharyngeal Swab; Respiratory  Result Value Ref Range Status   Adenovirus NOT DETECTED NOT DETECTED Final   Coronavirus 229E NOT DETECTED NOT DETECTED Final    Comment: (NOTE) The Coronavirus on the Respiratory Panel, DOES NOT test for the novel  Coronavirus (2019 nCoV)    Coronavirus HKU1 NOT DETECTED NOT DETECTED Final   Coronavirus NL63 NOT DETECTED NOT DETECTED Final   Coronavirus OC43 NOT DETECTED NOT DETECTED Final   Metapneumovirus NOT DETECTED NOT DETECTED Final   Rhinovirus / Enterovirus NOT DETECTED NOT DETECTED Final   Influenza A NOT DETECTED NOT DETECTED Final   Influenza B NOT DETECTED NOT DETECTED Final   Parainfluenza Virus 1 NOT DETECTED NOT DETECTED Final   Parainfluenza Virus 2 NOT DETECTED NOT DETECTED Final   Parainfluenza Virus 3 NOT DETECTED NOT DETECTED Final   Parainfluenza Virus 4 NOT DETECTED NOT DETECTED  Final   Respiratory Syncytial Virus NOT DETECTED NOT DETECTED Final   Bordetella pertussis NOT DETECTED NOT DETECTED Final   Bordetella Parapertussis NOT DETECTED NOT DETECTED Final   Chlamydophila pneumoniae NOT DETECTED NOT DETECTED Final   Mycoplasma pneumoniae NOT DETECTED NOT DETECTED Final  Comment: Performed at [facility], [address] [place], [place] [address]  MRSA Next Gen by PCR, Nasal     Status: None   Collection Time: 09/01/23 10:38 AM   Specimen: Urine, Clean Catch; Nasal Swab  Result Value Ref Range Status   MRSA by PCR Next Gen NOT DETECTED NOT DETECTED Final    Comment: (NOTE) The GeneXpert MRSA Assay (FDA approved for NASAL specimens only), is one component of a comprehensive MRSA colonization surveillance program. It is not intended to diagnose MRSA infection nor to guide or monitor treatment for MRSA infections. Test performance is not FDA approved in patients less than 9 years old. Performed at [facility], [address] [place], [place] [address]   Culture, blood (Routine X 2) w Reflex to ID Panel     Status: None (Preliminary result)   Collection Time: 09/02/23  6:35 AM   Specimen: Left Antecubital; Blood  Result Value Ref Range Status   Specimen Description   Final    LEFT ANTECUBITAL BOTTLES DRAWN AEROBIC AND ANAEROBIC   Special Requests Blood Culture adequate volume  Final   Culture   Final    NO GROWTH <12 HOURS Performed at [facility], [address], [place], [place] [address]    Report Status PENDING  Incomplete  Culture, blood (Routine X 2) w Reflex to ID Panel     Status: None (Preliminary result)   Collection Time: 09/02/23  6:42 AM   Specimen: BLOOD LEFT FOREARM  Result Value Ref Range Status   Specimen Description   Final    BLOOD LEFT FOREARM BOTTLES DRAWN AEROBIC AND ANAEROBIC   Special Requests   Final    Blood Culture results may not be optimal due to an inadequate volume of blood received in culture bottles   Culture    Final    NO GROWTH <12 HOURS Performed at [facility], [address], [place], [place] [address]    Report Status PENDING  Incomplete   Time coordinating discharge: 42 mins   SIGNED:  Standley Dakins, MD  [facility] 09/02/2023, 4:19 PM How to contact the [facility] Attending or Consulting provider 7A - 7P or covering provider during after hours 7P -7A, for this patient?  Check the care team in [facility] and look for a) attending/consulting [facility] provider listed and b) the [facility] team listed Log into [email or web address] and use [facility]'s universal password to access. If you do not have the password, please contact the hospital operator. Locate the [facility] provider you are looking for under [facility] and page to a number that you can be directly reached. If you still have difficulty reaching the provider, please page the [facility] (Director on Call) for the Hospitalists listed on amion for assistance.

## 2023-09-02 NOTE — Progress Notes (Signed)
PICC order noted.  Per Dr. Thedore Mins with infectious disease please hold off on placing PICC.  Dr. Laural Benes included in secure chat stating this.

## 2023-09-02 NOTE — Plan of Care (Signed)
  Problem: Health Behavior/Discharge Planning: Goal: Ability to manage health-related needs will improve Outcome: Progressing   Problem: Clinical Measurements: Goal: Ability to maintain clinical measurements within normal limits will improve Outcome: Progressing Goal: Will remain free from infection Outcome: Progressing Goal: Diagnostic test results will improve Outcome: Progressing   Problem: Activity: Goal: Risk for activity intolerance will decrease Outcome: Progressing   Problem: Pain Managment: Goal: General experience of comfort will improve and/or be controlled Outcome: Progressing

## 2023-09-02 NOTE — Consult Note (Signed)
Virtual Visit via Telephone/Video Note   I connected with Christen Butter   On 09/02/2023 at 10:36 AM  by Video and verified that I am speaking with the correct person using two identifiers.   I discussed the limitations, risks, security and privacy concerns of performing an evaluation and management service by videoand the availability of in person appointments.   Location:   Patient: Edward Marquez Provider: [facility]    Date of Admission:  08/31/2023   Total days of inpatient antibiotics 2        Reason for Consult: Terep bacteremia    Principal Problem:   CAP (community acquired pneumonia) Active Problems:   PVD (peripheral vascular disease) (HCC)   PAD (peripheral artery disease) (HCC)   Atrial fibrillation (HCC)   History of implantable cardioverter-defibrillator (ICD) placement   Hyperlipidemia LDL goal <70   Positive blood culture   Stage 3a chronic kidney disease (CKD) (HCC)   Assessment: 72 year old male with history of mechanical aortic valve, ICD on amiodarone and Coumadin followed by cardiology at [facility] last seen by Gabrielle Dare, PA on 08/21/2023, liver lesion under investigation, lung nodule admitted with: #Streptococcus bacteremia, 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  Reports fatigue may have started about 45 days ago.  He is followed by cardiology at [facility] with TTE on 08/27/2023 no vegetation. - Presented to [facility] cough and hypotension.  Chest x-ray showed no acute disease. - Denies any recent procedures.  He does have bruising but no cuts or at this point that are too concerning.  [place]  Review of cardiology note on 08/21/2023 appears patient has not been feeling well for about 6 months. - Fianc reports that he has been having chills, no fevers.  No dental issues. Recommendations:  -DC cefepime and  vancomycin - Start ceftriaxone - TTE, will need TEE.  Discussed with patient that they would like transfer to [facility] if feasible given cardiologist Dr. Sampson Goon has follow-up patient. - Follow repeat blood cultures for clearance - Follow-up blood cultures Microbiology:   Antibiotics: Azithromycin 1/18 Cefepime 1/18-present Ceftriaxone 1/18 Vancomycin 1/18-present  Cultures: Blood 1/18 2/2 Streptococcus species 1/28 pending Urine  Other   HPI: Edward Marquez is a 73 y.o. male with history of heart failure, reduced ejection fraction, biventricular ICD, mechanical aortic valve on Coumadin, PAD, history of bypass, ascending aortic aneurysm, liver lesion under investigation, lung nodule presented to [facility], ED with progressive respiratory symptoms with hypotension at home.  Patient's fianc noted that he has not been feeling well for the past 45 days, worsening since New Year's.  X-ray admission was stable.  No acute processes.  Right upper quadrant ultrasound showed contracted gallbladder with nonspecific wall thickening.  Admitted for atypical pneumonia, flu/COVID/RSV negative.  Started on cefepime and vancomycin.  Found to have Streptococcus bacteremia.  ID engaged.   Review of Systems: Review of Systems  All other systems reviewed and are negative.   Past Medical History:  Diagnosis Date   Aortic valve defect    Atrial fibrillation (HCC)    CHF (congestive heart failure) (HCC)    Clotting disorder (HCC)    Gout    Hypertension    Peripheral vascular disease (HCC)     Social History   Tobacco Use   Smoking status: Never   Smokeless tobacco: Never  Vaping Use   Vaping status:  Never Used  Substance Use Topics   Alcohol use: Yes    Alcohol/week: 5.0 standard drinks of alcohol    Types: 5 Glasses of wine per week    Comment: socially   Drug use: No    Family History  Problem Relation Age of Onset   Heart disease Mother 69       bypass surgery   Arthritis  Mother    Cancer Mother        breast/ colon   Hyperlipidemia Mother    Hypertension Mother    Scheduled Meds:  allopurinol  200 mg Oral Daily   amiodarone  200 mg Oral Daily   atorvastatin  20 mg Oral Daily   doxycycline  100 mg Oral Q12H   ezetimibe  10 mg Oral Daily   furosemide  20 mg Oral Daily   loratadine  10 mg Oral Daily   metoprolol succinate  12.5 mg Oral Daily   midodrine  5 mg Oral TID WC   sodium chloride flush  3 mL Intravenous Q12H   spironolactone  25 mg Oral QHS   [START ON 09/03/2023] Vitamin D (Ergocalciferol)  50,000 Units Oral Q Tue   Warfarin - Pharmacist Dosing Inpatient   Does not apply q1600   Continuous Infusions:  ceFEPime (MAXIPIME) IV 2 g (09/02/23 0841)   PRN Meds:.albuterol, perflutren lipid microspheres (DEFINITY) IV suspension Allergies  Allergen Reactions   Ivp Dye [Iodinated Contrast Media] Hives    OBJECTIVE: Blood pressure 104/62, pulse 64, temperature 98 F (36.7 C), temperature source Oral, resp. rate 20, height 5\' 6"  (1.676 m), weight 78.8 kg, SpO2 98%.   Lab Results Lab Results  Component Value Date   WBC 11.2 (H) 09/02/2023   HGB 10.4 (L) 09/02/2023   HCT 31.7 (L) 09/02/2023   MCV 99.7 09/02/2023   PLT 135 (L) 09/02/2023    Lab Results  Component Value Date   CREATININE 1.04 09/02/2023   BUN 30 (H) 09/02/2023   NA 133 (L) 09/02/2023   K 4.3 09/02/2023   CL 106 09/02/2023   CO2 19 (L) 09/02/2023    Lab Results  Component Value Date   ALT 65 (H) 08/31/2023   AST 84 (H) 08/31/2023   ALKPHOS 100 08/31/2023   BILITOT 1.3 (H) 08/31/2023       Danelle Earthly, MD [facility] 09/02/2023, 10:36 AM I have personally spent 82 minutes involved in face-to-face and non-face-to-face activities for this patient on the day of the visit.

## 2023-09-02 NOTE — Significant Event (Addendum)
Update: Bcx + 2/2 set with GPC with BCID strep sp, NOT Enterococcus, pyogenes, agalactiae, or pneumoniae. Currently on Abx with Cefepime. based on review other strep sp including viridans, bovis, C / F/ G, or strep like organisms typically covered with b-lactam +/- aminoglycoside in case of endocarditis. Will continue Cefepime for now, and ordered TTE to eval for endocarditis. Surveillance Cx ordered   Dolly Rias, MD  [facility]

## 2023-09-02 NOTE — Progress Notes (Signed)
PHARMACY - ANTICOAGULATION CONSULT NOTE  Pharmacy Consult for PTA warfarin  Indication: atrial fibrillation, AVR s/p mechanical aortic valve  Allergies  Allergen Reactions   Ivp Dye [Iodinated Contrast Media] Hives    Patient Measurements: Height: 5\' 6"  (167.6 cm) Weight: 78.8 kg (173 lb 11.6 oz) IBW/kg (Calculated) : 63.8  Vital Signs: Temp: 98 F (36.7 C) (01/20 0458) Temp Source: Oral (01/20 0458) BP: 110/63 (01/20 0458) Pulse Rate: 65 (01/20 0458)  Labs: Recent Labs    08/31/23 1828 08/31/23 2239 09/01/23 0547 09/02/23 0642  HGB 11.3*  --  10.6* 10.4*  HCT 33.9*  --  32.6* 31.7*  PLT 138*  --  111* 135*  LABPROT 31.8* 35.3* 38.1* 36.4*  INR 3.1* 3.5* 3.9* 3.6*  CREATININE 1.34*  --  1.49* 1.04    Estimated Creatinine Clearance: 64.3 mL/min (by C-G formula based on SCr of 1.04 mg/dL).   Medical History: Past Medical History:  Diagnosis Date   Aortic valve defect    Atrial fibrillation (HCC)    CHF (congestive heart failure) (HCC)    Clotting disorder (HCC)    Gout    Hypertension    Peripheral vascular disease (HCC)    Assessment: 17 yoM presented to ED with sepsis. PMH includes hyperlipidemia, atrial fibrillation, hx of mechanical aortic valve, PAD, and PVD. Pharmacy consulted to dose warfarin for mechanical aortic valve.  INR  3.5>3.9> 3.6  .  Hemoglobin down slightly but stable today: 11.3>10.6> 10.4. No bleeding issues noted. INR trending down, but will hold dose again tonight.   Patient started on broad antibiotics with cefepime and azithromycin so they can also potentiate INR. Patient was on amiodarone prior to admit.   -PTA warfarin 3.75 mg (1/2 7.5 mg tabs) PO daily (Weekly mg 26.25 per last anticoagulant note on 1/9)  Goal of Therapy:  INR 2.5-3.5 Monitor platelets by anticoagulation protocol: Yes   Plan:  Hold warfarin tonight Daily INR  Elder Cyphers, BS Pharm D, BCPS Clinical Pharmacist 09/02/2023 8:20 AM

## 2023-09-02 NOTE — Plan of Care (Signed)
Patient is being transferred to [facility]. Report called to receiving RN.  Problem: Health Behavior/Discharge Planning: Goal: Ability to manage health-related needs will improve 09/02/2023 1737 by Melvern Sample, RN Outcome: Adequate for Discharge 09/02/2023 1601 by Melvern Sample, RN Outcome: Progressing   Problem: Clinical Measurements: Goal: Ability to maintain clinical measurements within normal limits will improve 09/02/2023 1737 by Melvern Sample, RN Outcome: Adequate for Discharge 09/02/2023 1601 by Melvern Sample, RN Outcome: Progressing Goal: Will remain free from infection 09/02/2023 1737 by Melvern Sample, RN Outcome: Adequate for Discharge 09/02/2023 1601 by Melvern Sample, RN Outcome: Progressing Goal: Diagnostic test results will improve 09/02/2023 1737 by Melvern Sample, RN Outcome: Adequate for Discharge 09/02/2023 1601 by Melvern Sample, RN Outcome: Progressing   Problem: Activity: Goal: Risk for activity intolerance will decrease 09/02/2023 1737 by Melvern Sample, RN Outcome: Adequate for Discharge 09/02/2023 1601 by Melvern Sample, RN Outcome: Progressing   Problem: Pain Managment: Goal: General experience of comfort will improve and/or be controlled 09/02/2023 1737 by Melvern Sample, RN Outcome: Adequate for Discharge 09/02/2023 1601 by Melvern Sample, RN Outcome: Progressing

## 2023-09-02 NOTE — Care Management Important Message (Signed)
Important Message  Patient Details  Name: Edward Marquez MRN: [id number] Date of Birth: 07/26/1951   Important Message Given:  N/A - LOS <3 / Initial given by admissions     Corey Harold 09/02/2023, 11:19 AM

## 2023-09-03 DIAGNOSIS — I700 Atherosclerosis of aorta: Secondary | ICD-10-CM | POA: Diagnosis not present

## 2023-09-03 DIAGNOSIS — T826XXA Infection and inflammatory reaction due to cardiac valve prosthesis, initial encounter: Secondary | ICD-10-CM | POA: Diagnosis not present

## 2023-09-03 DIAGNOSIS — I714 Abdominal aortic aneurysm, without rupture, unspecified: Secondary | ICD-10-CM | POA: Diagnosis not present

## 2023-09-03 DIAGNOSIS — Z91041 Radiographic dye allergy status: Secondary | ICD-10-CM | POA: Diagnosis not present

## 2023-09-03 DIAGNOSIS — Z79899 Other long term (current) drug therapy: Secondary | ICD-10-CM | POA: Diagnosis not present

## 2023-09-03 DIAGNOSIS — M25561 Pain in right knee: Secondary | ICD-10-CM | POA: Diagnosis not present

## 2023-09-03 DIAGNOSIS — J9601 Acute respiratory failure with hypoxia: Secondary | ICD-10-CM | POA: Diagnosis not present

## 2023-09-03 DIAGNOSIS — M25461 Effusion, right knee: Secondary | ICD-10-CM | POA: Diagnosis not present

## 2023-09-03 DIAGNOSIS — R6521 Severe sepsis with septic shock: Secondary | ICD-10-CM | POA: Diagnosis not present

## 2023-09-03 DIAGNOSIS — I739 Peripheral vascular disease, unspecified: Secondary | ICD-10-CM | POA: Diagnosis not present

## 2023-09-03 DIAGNOSIS — B954 Other streptococcus as the cause of diseases classified elsewhere: Secondary | ICD-10-CM | POA: Diagnosis not present

## 2023-09-03 DIAGNOSIS — B955 Unspecified streptococcus as the cause of diseases classified elsewhere: Secondary | ICD-10-CM | POA: Diagnosis not present

## 2023-09-03 DIAGNOSIS — M009 Pyogenic arthritis, unspecified: Secondary | ICD-10-CM | POA: Diagnosis not present

## 2023-09-03 DIAGNOSIS — I429 Cardiomyopathy, unspecified: Secondary | ICD-10-CM | POA: Diagnosis not present

## 2023-09-03 DIAGNOSIS — E871 Hypo-osmolality and hyponatremia: Secondary | ICD-10-CM | POA: Diagnosis not present

## 2023-09-03 DIAGNOSIS — I5020 Unspecified systolic (congestive) heart failure: Secondary | ICD-10-CM | POA: Diagnosis not present

## 2023-09-03 DIAGNOSIS — A419 Sepsis, unspecified organism: Secondary | ICD-10-CM | POA: Diagnosis not present

## 2023-09-03 DIAGNOSIS — I082 Rheumatic disorders of both aortic and tricuspid valves: Secondary | ICD-10-CM | POA: Diagnosis not present

## 2023-09-03 DIAGNOSIS — I5022 Chronic systolic (congestive) heart failure: Secondary | ICD-10-CM | POA: Diagnosis not present

## 2023-09-03 DIAGNOSIS — R911 Solitary pulmonary nodule: Secondary | ICD-10-CM | POA: Diagnosis not present

## 2023-09-03 DIAGNOSIS — I38 Endocarditis, valve unspecified: Secondary | ICD-10-CM | POA: Diagnosis not present

## 2023-09-03 DIAGNOSIS — I081 Rheumatic disorders of both mitral and tricuspid valves: Secondary | ICD-10-CM | POA: Diagnosis not present

## 2023-09-03 DIAGNOSIS — T826XXD Infection and inflammatory reaction due to cardiac valve prosthesis, subsequent encounter: Secondary | ICD-10-CM | POA: Diagnosis not present

## 2023-09-03 DIAGNOSIS — K769 Liver disease, unspecified: Secondary | ICD-10-CM | POA: Diagnosis not present

## 2023-09-03 DIAGNOSIS — Z952 Presence of prosthetic heart valve: Secondary | ICD-10-CM | POA: Diagnosis not present

## 2023-09-03 DIAGNOSIS — E861 Hypovolemia: Secondary | ICD-10-CM | POA: Diagnosis not present

## 2023-09-03 DIAGNOSIS — R7881 Bacteremia: Secondary | ICD-10-CM | POA: Diagnosis not present

## 2023-09-03 DIAGNOSIS — Z95810 Presence of automatic (implantable) cardiac defibrillator: Secondary | ICD-10-CM | POA: Diagnosis not present

## 2023-09-03 DIAGNOSIS — M1711 Unilateral primary osteoarthritis, right knee: Secondary | ICD-10-CM | POA: Diagnosis not present

## 2023-09-03 DIAGNOSIS — T827XXA Infection and inflammatory reaction due to other cardiac and vascular devices, implants and grafts, initial encounter: Secondary | ICD-10-CM | POA: Diagnosis not present

## 2023-09-03 DIAGNOSIS — I330 Acute and subacute infective endocarditis: Secondary | ICD-10-CM | POA: Diagnosis not present

## 2023-09-03 DIAGNOSIS — N1831 Chronic kidney disease, stage 3a: Secondary | ICD-10-CM | POA: Diagnosis not present

## 2023-09-03 DIAGNOSIS — Z7901 Long term (current) use of anticoagulants: Secondary | ICD-10-CM | POA: Diagnosis not present

## 2023-09-03 DIAGNOSIS — I480 Paroxysmal atrial fibrillation: Secondary | ICD-10-CM | POA: Diagnosis not present

## 2023-09-03 DIAGNOSIS — Z452 Encounter for adjustment and management of vascular access device: Secondary | ICD-10-CM | POA: Diagnosis not present

## 2023-09-03 LAB — RENAL FUNCTION PANEL
Albumin: 2.7 g/dL — ABNORMAL LOW (ref 3.5–5.0)
Anion gap: 6 (ref 5–15)
BUN: 22 mg/dL (ref 8–23)
CO2: 21 mmol/L — ABNORMAL LOW (ref 22–32)
Calcium: 8.3 mg/dL — ABNORMAL LOW (ref 8.9–10.3)
Chloride: 106 mmol/L (ref 98–111)
Creatinine, Ser: 0.94 mg/dL (ref 0.61–1.24)
GFR, Estimated: >60 mL/min (ref >60)
Glucose, Bld: 125 mg/dL — ABNORMAL HIGH (ref 70–99)
Phosphorus: 2.5 mg/dL (ref 2.5–4.6)
Potassium: 4.3 mmol/L (ref 3.5–5.1)
Sodium: 133 mmol/L — ABNORMAL LOW (ref 135–145)

## 2023-09-03 LAB — CBC
HCT: 31.7 % — ABNORMAL LOW (ref 39.0–52.0)
Hemoglobin: 10.2 g/dL — ABNORMAL LOW (ref 13.0–17.0)
MCH: 32.1 pg (ref 26.0–34.0)
MCHC: 32.2 g/dL (ref 30.0–36.0)
MCV: 99.7 fL (ref 80.0–100.0)
Platelets: 143 10*3/uL — ABNORMAL LOW (ref 150–400)
RBC: 3.18 MIL/uL — ABNORMAL LOW (ref 4.22–5.81)
RDW: 14.8 % (ref 11.5–15.5)
WBC: 9.2 10*3/uL (ref 4.0–10.5)
nRBC: 0 % (ref 0.0–0.2)

## 2023-09-03 LAB — PROTIME-INR
INR: 3.3 — ABNORMAL HIGH (ref 0.8–1.2)
Prothrombin Time: 33.7 s — ABNORMAL HIGH (ref 11.4–15.2)

## 2023-09-03 MED ORDER — WARFARIN SODIUM 2.5 MG PO TABS
3.7500 mg | ORAL_TABLET | Freq: Once | ORAL | Status: DC
  Filled 2023-09-03: qty 1

## 2023-09-03 NOTE — Progress Notes (Addendum)
PHARMACY - ANTICOAGULATION CONSULT NOTE  Pharmacy Consult for PTA warfarin  Indication: atrial fibrillation, AVR s/p mechanical aortic valve  Allergies  Allergen Reactions   Ivp Dye [Iodinated Contrast Media] Hives    Patient Measurements: Height: 5\' 6"  (167.6 cm) Weight: 78.8 kg (173 lb 11.6 oz) IBW/kg (Calculated) : 63.8  Vital Signs: Temp: 98.2 F (36.8 C) (01/21 0337) Temp Source: Oral (01/21 0337) BP: 118/66 (01/21 0758) Pulse Rate: 64 (01/21 0758)  Labs: Recent Labs    09/01/23 0547 09/02/23 0642 09/03/23 0340  HGB 10.6* 10.4* 10.2*  HCT 32.6* 31.7* 31.7*  PLT 111* 135* 143*  LABPROT 38.1* 36.4* 33.7*  INR 3.9* 3.6* 3.3*  CREATININE 1.49* 1.04 0.94    Estimated Creatinine Clearance: 71.2 mL/min (by C-G formula based on SCr of 0.94 mg/dL).   Medical History: Past Medical History:  Diagnosis Date   Aortic valve defect    Atrial fibrillation (HCC)    CHF (congestive heart failure) (HCC)    Clotting disorder (HCC)    Gout    Hypertension    Peripheral vascular disease (HCC)    Assessment: 89 yoM presented to ED with sepsis. PMH includes hyperlipidemia, atrial fibrillation, hx of mechanical aortic valve, PAD, and PVD. Pharmacy consulted to dose warfarin for mechanical aortic valve.  INR  3.5>3.9> 3.6 > 3.3 .  Hemoglobin stable today: 11.3>10.6> 10.4> 10.2. No bleeding issues noted. Patient started on broad antibiotics with cefepime and azithromycin so they can also potentiate INR. Patient was on amiodarone prior to admit.   -PTA warfarin 3.75 mg (1/2 7.5 mg tabs) PO daily  (Weekly 26.25 mg  per last anticoagulant note on 08/22/23 from [facility] and the patient confirmed)  Goal of Therapy:  INR 2.5-3.5 Monitor platelets by anticoagulation protocol: Yes   Plan:  Warfarin 3.75mg  x 1 today Daily INR  Elder Cyphers, BS Pharm D, BCPS Clinical Pharmacist 09/03/2023 8:02 AM

## 2023-09-03 NOTE — Plan of Care (Signed)
Pt is awaiting transportation to [facility]. Adequate for discharge.

## 2023-09-04 DIAGNOSIS — B955 Unspecified streptococcus as the cause of diseases classified elsewhere: Secondary | ICD-10-CM | POA: Diagnosis not present

## 2023-09-04 DIAGNOSIS — T827XXA Infection and inflammatory reaction due to other cardiac and vascular devices, implants and grafts, initial encounter: Secondary | ICD-10-CM | POA: Diagnosis not present

## 2023-09-04 DIAGNOSIS — R7881 Bacteremia: Secondary | ICD-10-CM | POA: Diagnosis not present

## 2023-09-04 DIAGNOSIS — Z952 Presence of prosthetic heart valve: Secondary | ICD-10-CM | POA: Diagnosis not present

## 2023-09-04 LAB — CULTURE, BLOOD (ROUTINE X 2)
Special Requests: NORMAL
Special Requests: NORMAL

## 2023-09-05 DIAGNOSIS — M25461 Effusion, right knee: Secondary | ICD-10-CM | POA: Diagnosis not present

## 2023-09-05 DIAGNOSIS — T826XXD Infection and inflammatory reaction due to cardiac valve prosthesis, subsequent encounter: Secondary | ICD-10-CM | POA: Diagnosis not present

## 2023-09-05 DIAGNOSIS — B955 Unspecified streptococcus as the cause of diseases classified elsewhere: Secondary | ICD-10-CM | POA: Diagnosis not present

## 2023-09-05 DIAGNOSIS — R7881 Bacteremia: Secondary | ICD-10-CM | POA: Diagnosis not present

## 2023-09-05 DIAGNOSIS — I38 Endocarditis, valve unspecified: Secondary | ICD-10-CM | POA: Diagnosis not present

## 2023-09-05 DIAGNOSIS — T827XXA Infection and inflammatory reaction due to other cardiac and vascular devices, implants and grafts, initial encounter: Secondary | ICD-10-CM | POA: Diagnosis not present

## 2023-09-05 DIAGNOSIS — M1711 Unilateral primary osteoarthritis, right knee: Secondary | ICD-10-CM | POA: Diagnosis not present

## 2023-09-05 DIAGNOSIS — M009 Pyogenic arthritis, unspecified: Secondary | ICD-10-CM | POA: Diagnosis not present

## 2023-09-05 DIAGNOSIS — M25561 Pain in right knee: Secondary | ICD-10-CM | POA: Diagnosis not present

## 2023-09-06 DIAGNOSIS — R7881 Bacteremia: Secondary | ICD-10-CM | POA: Diagnosis not present

## 2023-09-06 DIAGNOSIS — Z452 Encounter for adjustment and management of vascular access device: Secondary | ICD-10-CM | POA: Diagnosis not present

## 2023-09-06 DIAGNOSIS — T826XXD Infection and inflammatory reaction due to cardiac valve prosthesis, subsequent encounter: Secondary | ICD-10-CM | POA: Diagnosis not present

## 2023-09-06 DIAGNOSIS — I38 Endocarditis, valve unspecified: Secondary | ICD-10-CM | POA: Diagnosis not present

## 2023-09-06 DIAGNOSIS — B955 Unspecified streptococcus as the cause of diseases classified elsewhere: Secondary | ICD-10-CM | POA: Diagnosis not present

## 2023-09-06 DIAGNOSIS — T827XXA Infection and inflammatory reaction due to other cardiac and vascular devices, implants and grafts, initial encounter: Secondary | ICD-10-CM | POA: Diagnosis not present

## 2023-09-07 DIAGNOSIS — B955 Unspecified streptococcus as the cause of diseases classified elsewhere: Secondary | ICD-10-CM | POA: Diagnosis not present

## 2023-09-07 DIAGNOSIS — R7881 Bacteremia: Secondary | ICD-10-CM | POA: Diagnosis not present

## 2023-09-07 LAB — CULTURE, BLOOD (ROUTINE X 2)
Culture: NO GROWTH
Culture: NO GROWTH
Special Requests: ADEQUATE

## 2023-09-09 ENCOUNTER — Telehealth: Payer: Self-pay | Admitting: *Deleted

## 2023-09-09 DIAGNOSIS — B955 Unspecified streptococcus as the cause of diseases classified elsewhere: Secondary | ICD-10-CM | POA: Diagnosis not present

## 2023-09-09 DIAGNOSIS — R7881 Bacteremia: Secondary | ICD-10-CM | POA: Diagnosis not present

## 2023-09-09 NOTE — Transitions of Care (Post Inpatient/ED Visit) (Signed)
09/09/2023  Name: Edward Marquez MRN: [id number] DOB: [birth year]  Today's TOC FU Call Status: Today's TOC FU Call Status:: Successful TOC FU Call Completed TOC FU Call Complete Date: 09/09/23 Patient's Name and Date of Birth confirmed.  Transition Care Management Follow-up Telephone Call Date of Discharge: 09/06/23 Discharge Facility: Other [facility]) Name of Other ([facility]) Discharge Facility: [facility] Type of Discharge: Inpatient Admission Primary Inpatient Discharge Diagnosis:: Streptococcal bacteremia How have you been since you were released from the hospital?: Better (Patient is better but has developed swelling/ redness in left ankle) Any questions or concerns?: Yes Patient Questions/Concerns:: Concerns if this swelling and redness has anything to do with the streptoccal bacteremia Patient Questions/Concerns Addressed: Other: (Patient has put a call into PCP and will call cardiologist and infectious disease)  Items Reviewed: Did you receive and understand the discharge instructions provided?: No Medications obtained,verified, and reconciled?: Yes (Medications Reviewed) Any new allergies since your discharge?: No Dietary orders reviewed?: No Do you have support at home?: Yes Name of Support/Comfort Primary Source: [name]  Medications Reviewed Today: Medications Reviewed Today     Reviewed by Luella Cook, RN (Case Manager) on 09/09/23 at 1324  Med List Status: <None>   Medication Order Taking? Sig Documenting Provider Last Dose Status Informant  allopurinol (ZYLOPRIM) 100 MG tablet [id number] Yes Take 200 mg by mouth daily. [provider] Taking Active Self, Pharmacy Records  amiodarone (PACERONE) 200 MG tablet [id number] Yes Take 200 mg by mouth at bedtime. [provider] Taking Active Self, Pharmacy Records  atorvastatin (LIPITOR) 20 MG tablet [id number] Yes TAKE 1 TABLET BY MOUTH EVERY DAY Gilmore Laroche, FNP Taking Active Self,  Pharmacy Records  cefTRIAXone 2 g in sodium chloride 0.9 % 100 mL [id number] Yes Inject 2 g into the vein daily. Cleora Fleet, MD Taking Active   ergocalciferol (VITAMIN D2) 1.25 MG (50000 UT) capsule [id number] Yes Take 50,000 Units by mouth every Tuesday. [provider] Taking Active Self, Pharmacy Records  ezetimibe (ZETIA) 10 MG tablet [id number] Yes Take 10 mg by mouth daily. [provider] Taking Active Self, Pharmacy Records  furosemide (LASIX) 20 MG tablet [id number] Yes Take 20 mg by mouth daily. [provider] Taking Active Self, Pharmacy Records  levocetirizine (XYZAL) 5 MG tablet [id number] Yes Take 5 mg by mouth daily. [provider] Taking Active Self, Pharmacy Records  metoprolol succinate (TOPROL-XL) 25 MG 24 hr tablet [id number] Yes Take 0.5 tablets (12.5 mg total) by mouth daily. Take with or immediately following a meal. Johnson, Clanford L, MD Taking Active   midodrine (PROAMATINE) 5 MG tablet [id number] Yes Take 1 tablet (5 mg total) by mouth 3 (three) times daily with meals. Cleora Fleet, MD Taking Active   spironolactone (ALDACTONE) 25 MG tablet [id number] Yes Take 25 mg by mouth at bedtime. [provider] Taking Active Self, Pharmacy Records  warfarin (COUMADIN) 7.5 MG tablet [id number] Yes Take 3.75 mg by mouth at bedtime. [provider] Taking Active Self, Pharmacy Records           Med Note Dutch Quint, Dola Factor   Tue Sep 03, 2023  8:41 AM)              Home Care and Equipment/Supplies: Were Home Health Services Ordered?: Yes Name of Home Health Agency:: [facility] Has Agency set up a time to come to your home?: Yes First Home Health Visit Date: 09/09/23 Any new equipment or medical supplies ordered?: NA  Functional Questionnaire:  Do you need assistance with bathing/showering or dressing?: Yes Do you need assistance with meal preparation?: Yes Do you need assistance with eating?: No Do you have difficulty  maintaining continence: No Do you need assistance with getting out of bed/getting out of a chair/moving?: No Do you have difficulty managing or taking your medications?: Yes  Follow up appointments reviewed: PCP Follow-up appointment confirmed?: NA Specialist Hospital Follow-up appointment confirmed?: Yes Date of Specialist follow-up appointment?: 10/02/23 Follow-Up Specialty Provider:: Osvaldo Angst Do you need transportation to your follow-up appointment?: No Do you understand care options if your condition(s) worsen?: Yes-patient verbalized understanding  SDOH Interventions Today    Flowsheet Row Most Recent Value  SDOH Interventions   Food Insecurity Interventions Intervention Not Indicated  Housing Interventions Intervention Not Indicated  Transportation Interventions Intervention Not Indicated  Utilities Interventions Intervention Not Indicated       Goals Addressed             This Visit's Progress    TOC 30 day program       Current Barriers:  Knowledge Deficits related to plan of care for management of   Streptococcal bacteremia   RNCM Clinical Goal(s):  Patient will work with the Care Management team over the next 30 days to address Transition of Care Barriers: Medication Management take all medications exactly as prescribed and will call provider for medication related questions as evidenced by EMR attend all scheduled medical appointments: Specialist as evidenced by EMR  through collaboration with RN Care manager, provider, and care team.   Interventions: Evaluation of current treatment plan related to  self management and patient's adherence to plan as established by provider  Transitions of Care:  New goal. Doctor Visits  - discussed the importance of doctor visits  Patient Goals/Self-Care Activities: Participate in Transition of Care Program/Attend [facility] scheduled calls Notify RN Care Manager of TOC call rescheduling needs Take all medications as  prescribed Attend all scheduled provider appointments Call pharmacy for medication refills 3-7 days in advance of running out of medications  Follow Up Plan:  Telephone follow up appointment with care management team member scheduled for:  Lonia Chimera [phone] 11:00 The patient has been provided with contact information for the care management team and has been advised to call with any health related questions or concerns.         Interventions Today    Flowsheet Row Most Recent Value  Chronic Disease   Chronic disease during today's visit Other  [Streptococcal bacteremia]  General Interventions   General Interventions Discussed/Reviewed General Interventions Discussed, General Interventions Reviewed, Doctor Visits, Referral to Nurse  Freada Bergeron to Lonia Chimera  Case Care Manager for follow up care]  Doctor Visits Discussed/Reviewed Doctor Visits Discussed, Doctor Visits Reviewed, Specialist  Pharmacy Interventions   Pharmacy Dicussed/Reviewed Pharmacy Topics Discussed, Pharmacy Topics Reviewed       Patient has agreed to follow up outreach with Lonia Chimera Case Care Manager  Gean Maidens BSN RN [facility] Health Care Management Coordinator [email or web address] Direct Dial: [phone]  Fax: [phone] Website: [email or web address]

## 2023-09-10 DIAGNOSIS — R932 Abnormal findings on diagnostic imaging of liver and biliary tract: Secondary | ICD-10-CM | POA: Diagnosis not present

## 2023-09-10 DIAGNOSIS — B955 Unspecified streptococcus as the cause of diseases classified elsewhere: Secondary | ICD-10-CM | POA: Diagnosis not present

## 2023-09-10 DIAGNOSIS — C220 Liver cell carcinoma: Secondary | ICD-10-CM | POA: Diagnosis not present

## 2023-09-10 DIAGNOSIS — K769 Liver disease, unspecified: Secondary | ICD-10-CM | POA: Diagnosis not present

## 2023-09-10 DIAGNOSIS — I517 Cardiomegaly: Secondary | ICD-10-CM | POA: Diagnosis not present

## 2023-09-10 DIAGNOSIS — I700 Atherosclerosis of aorta: Secondary | ICD-10-CM | POA: Diagnosis not present

## 2023-09-10 DIAGNOSIS — R7881 Bacteremia: Secondary | ICD-10-CM | POA: Diagnosis not present

## 2023-09-10 DIAGNOSIS — I2510 Atherosclerotic heart disease of native coronary artery without angina pectoris: Secondary | ICD-10-CM | POA: Diagnosis not present

## 2023-09-13 DIAGNOSIS — Z952 Presence of prosthetic heart valve: Secondary | ICD-10-CM | POA: Diagnosis not present

## 2023-09-14 DIAGNOSIS — I5020 Unspecified systolic (congestive) heart failure: Secondary | ICD-10-CM | POA: Diagnosis not present

## 2023-09-14 DIAGNOSIS — E1151 Type 2 diabetes mellitus with diabetic peripheral angiopathy without gangrene: Secondary | ICD-10-CM | POA: Diagnosis not present

## 2023-09-14 DIAGNOSIS — E1122 Type 2 diabetes mellitus with diabetic chronic kidney disease: Secondary | ICD-10-CM | POA: Diagnosis not present

## 2023-09-14 DIAGNOSIS — I330 Acute and subacute infective endocarditis: Secondary | ICD-10-CM | POA: Diagnosis not present

## 2023-09-14 DIAGNOSIS — I081 Rheumatic disorders of both mitral and tricuspid valves: Secondary | ICD-10-CM | POA: Diagnosis not present

## 2023-09-14 DIAGNOSIS — N179 Acute kidney failure, unspecified: Secondary | ICD-10-CM | POA: Diagnosis not present

## 2023-09-14 DIAGNOSIS — I350 Nonrheumatic aortic (valve) stenosis: Secondary | ICD-10-CM | POA: Diagnosis not present

## 2023-09-14 DIAGNOSIS — I130 Hypertensive heart and chronic kidney disease with heart failure and stage 1 through stage 4 chronic kidney disease, or unspecified chronic kidney disease: Secondary | ICD-10-CM | POA: Diagnosis not present

## 2023-09-14 DIAGNOSIS — H5500 Unspecified nystagmus: Secondary | ICD-10-CM | POA: Diagnosis not present

## 2023-09-14 DIAGNOSIS — Z8619 Personal history of other infectious and parasitic diseases: Secondary | ICD-10-CM | POA: Diagnosis not present

## 2023-09-14 DIAGNOSIS — M130 Polyarthritis, unspecified: Secondary | ICD-10-CM | POA: Diagnosis not present

## 2023-09-14 DIAGNOSIS — M2550 Pain in unspecified joint: Secondary | ICD-10-CM | POA: Diagnosis not present

## 2023-09-14 DIAGNOSIS — I340 Nonrheumatic mitral (valve) insufficiency: Secondary | ICD-10-CM | POA: Diagnosis not present

## 2023-09-14 DIAGNOSIS — R279 Unspecified lack of coordination: Secondary | ICD-10-CM | POA: Diagnosis not present

## 2023-09-14 DIAGNOSIS — M6281 Muscle weakness (generalized): Secondary | ICD-10-CM | POA: Diagnosis not present

## 2023-09-14 DIAGNOSIS — I639 Cerebral infarction, unspecified: Secondary | ICD-10-CM | POA: Diagnosis not present

## 2023-09-14 DIAGNOSIS — K219 Gastro-esophageal reflux disease without esophagitis: Secondary | ICD-10-CM | POA: Diagnosis not present

## 2023-09-14 DIAGNOSIS — I5022 Chronic systolic (congestive) heart failure: Secondary | ICD-10-CM | POA: Diagnosis not present

## 2023-09-14 DIAGNOSIS — R768 Other specified abnormal immunological findings in serum: Secondary | ICD-10-CM | POA: Diagnosis not present

## 2023-09-14 DIAGNOSIS — H902 Conductive hearing loss, unspecified: Secondary | ICD-10-CM | POA: Diagnosis not present

## 2023-09-14 DIAGNOSIS — E872 Acidosis, unspecified: Secondary | ICD-10-CM | POA: Diagnosis not present

## 2023-09-14 DIAGNOSIS — J810 Acute pulmonary edema: Secondary | ICD-10-CM | POA: Diagnosis not present

## 2023-09-14 DIAGNOSIS — I4819 Other persistent atrial fibrillation: Secondary | ICD-10-CM | POA: Diagnosis not present

## 2023-09-14 DIAGNOSIS — I361 Nonrheumatic tricuspid (valve) insufficiency: Secondary | ICD-10-CM | POA: Diagnosis not present

## 2023-09-14 DIAGNOSIS — I63531 Cerebral infarction due to unspecified occlusion or stenosis of right posterior cerebral artery: Secondary | ICD-10-CM | POA: Diagnosis not present

## 2023-09-14 DIAGNOSIS — J9811 Atelectasis: Secondary | ICD-10-CM | POA: Diagnosis not present

## 2023-09-14 DIAGNOSIS — R42 Dizziness and giddiness: Secondary | ICD-10-CM | POA: Diagnosis not present

## 2023-09-14 DIAGNOSIS — I63532 Cerebral infarction due to unspecified occlusion or stenosis of left posterior cerebral artery: Secondary | ICD-10-CM | POA: Diagnosis not present

## 2023-09-14 DIAGNOSIS — Z950 Presence of cardiac pacemaker: Secondary | ICD-10-CM | POA: Diagnosis not present

## 2023-09-14 DIAGNOSIS — I76 Septic arterial embolism: Secondary | ICD-10-CM | POA: Diagnosis not present

## 2023-09-14 DIAGNOSIS — I2510 Atherosclerotic heart disease of native coronary artery without angina pectoris: Secondary | ICD-10-CM | POA: Diagnosis not present

## 2023-09-14 DIAGNOSIS — I499 Cardiac arrhythmia, unspecified: Secondary | ICD-10-CM | POA: Diagnosis not present

## 2023-09-14 DIAGNOSIS — Z95810 Presence of automatic (implantable) cardiac defibrillator: Secondary | ICD-10-CM | POA: Diagnosis not present

## 2023-09-14 DIAGNOSIS — R931 Abnormal findings on diagnostic imaging of heart and coronary circulation: Secondary | ICD-10-CM | POA: Diagnosis not present

## 2023-09-14 DIAGNOSIS — I38 Endocarditis, valve unspecified: Secondary | ICD-10-CM | POA: Diagnosis not present

## 2023-09-14 DIAGNOSIS — I7121 Aneurysm of the ascending aorta, without rupture: Secondary | ICD-10-CM | POA: Diagnosis not present

## 2023-09-14 DIAGNOSIS — I420 Dilated cardiomyopathy: Secondary | ICD-10-CM | POA: Diagnosis not present

## 2023-09-14 DIAGNOSIS — I5023 Acute on chronic systolic (congestive) heart failure: Secondary | ICD-10-CM | POA: Diagnosis not present

## 2023-09-14 DIAGNOSIS — J90 Pleural effusion, not elsewhere classified: Secondary | ICD-10-CM | POA: Diagnosis not present

## 2023-09-14 DIAGNOSIS — I358 Other nonrheumatic aortic valve disorders: Secondary | ICD-10-CM | POA: Diagnosis not present

## 2023-09-14 DIAGNOSIS — R930 Abnormal findings on diagnostic imaging of skull and head, not elsewhere classified: Secondary | ICD-10-CM | POA: Diagnosis not present

## 2023-09-14 DIAGNOSIS — T826XXA Infection and inflammatory reaction due to cardiac valve prosthesis, initial encounter: Secondary | ICD-10-CM | POA: Diagnosis not present

## 2023-09-14 DIAGNOSIS — D696 Thrombocytopenia, unspecified: Secondary | ICD-10-CM | POA: Diagnosis not present

## 2023-09-14 DIAGNOSIS — I739 Peripheral vascular disease, unspecified: Secondary | ICD-10-CM | POA: Diagnosis not present

## 2023-09-14 DIAGNOSIS — N1831 Chronic kidney disease, stage 3a: Secondary | ICD-10-CM | POA: Diagnosis not present

## 2023-09-14 DIAGNOSIS — Z4659 Encounter for fitting and adjustment of other gastrointestinal appliance and device: Secondary | ICD-10-CM | POA: Diagnosis not present

## 2023-09-14 DIAGNOSIS — I63449 Cerebral infarction due to embolism of unspecified cerebellar artery: Secondary | ICD-10-CM | POA: Diagnosis not present

## 2023-09-14 DIAGNOSIS — E785 Hyperlipidemia, unspecified: Secondary | ICD-10-CM | POA: Diagnosis not present

## 2023-09-14 DIAGNOSIS — I4430 Unspecified atrioventricular block: Secondary | ICD-10-CM | POA: Diagnosis not present

## 2023-09-14 DIAGNOSIS — Z952 Presence of prosthetic heart valve: Secondary | ICD-10-CM | POA: Diagnosis not present

## 2023-09-14 DIAGNOSIS — T8201XD Breakdown (mechanical) of heart valve prosthesis, subsequent encounter: Secondary | ICD-10-CM | POA: Diagnosis not present

## 2023-09-14 DIAGNOSIS — B954 Other streptococcus as the cause of diseases classified elsewhere: Secondary | ICD-10-CM | POA: Diagnosis not present

## 2023-09-14 DIAGNOSIS — I4811 Longstanding persistent atrial fibrillation: Secondary | ICD-10-CM | POA: Diagnosis not present

## 2023-09-14 DIAGNOSIS — K089 Disorder of teeth and supporting structures, unspecified: Secondary | ICD-10-CM | POA: Diagnosis not present

## 2023-09-14 DIAGNOSIS — M109 Gout, unspecified: Secondary | ICD-10-CM | POA: Diagnosis not present

## 2023-09-14 DIAGNOSIS — B955 Unspecified streptococcus as the cause of diseases classified elsewhere: Secondary | ICD-10-CM | POA: Diagnosis not present

## 2023-09-14 DIAGNOSIS — D62 Acute posthemorrhagic anemia: Secondary | ICD-10-CM | POA: Diagnosis not present

## 2023-09-14 DIAGNOSIS — R935 Abnormal findings on diagnostic imaging of other abdominal regions, including retroperitoneum: Secondary | ICD-10-CM | POA: Diagnosis not present

## 2023-09-14 DIAGNOSIS — R7881 Bacteremia: Secondary | ICD-10-CM | POA: Diagnosis not present

## 2023-09-14 DIAGNOSIS — H9190 Unspecified hearing loss, unspecified ear: Secondary | ICD-10-CM | POA: Diagnosis not present

## 2023-09-14 DIAGNOSIS — I371 Nonrheumatic pulmonary valve insufficiency: Secondary | ICD-10-CM | POA: Diagnosis not present

## 2023-09-14 DIAGNOSIS — I4891 Unspecified atrial fibrillation: Secondary | ICD-10-CM | POA: Diagnosis not present

## 2023-09-14 DIAGNOSIS — I10 Essential (primary) hypertension: Secondary | ICD-10-CM | POA: Diagnosis not present

## 2023-09-15 DIAGNOSIS — R42 Dizziness and giddiness: Secondary | ICD-10-CM | POA: Diagnosis not present

## 2023-09-15 DIAGNOSIS — I76 Septic arterial embolism: Secondary | ICD-10-CM | POA: Diagnosis not present

## 2023-09-15 DIAGNOSIS — I38 Endocarditis, valve unspecified: Secondary | ICD-10-CM | POA: Diagnosis not present

## 2023-09-15 DIAGNOSIS — T826XXA Infection and inflammatory reaction due to cardiac valve prosthesis, initial encounter: Secondary | ICD-10-CM | POA: Diagnosis not present

## 2023-09-16 DIAGNOSIS — I081 Rheumatic disorders of both mitral and tricuspid valves: Secondary | ICD-10-CM | POA: Diagnosis not present

## 2023-09-16 DIAGNOSIS — T826XXA Infection and inflammatory reaction due to cardiac valve prosthesis, initial encounter: Secondary | ICD-10-CM | POA: Diagnosis not present

## 2023-09-16 DIAGNOSIS — I5020 Unspecified systolic (congestive) heart failure: Secondary | ICD-10-CM | POA: Diagnosis not present

## 2023-09-16 DIAGNOSIS — I350 Nonrheumatic aortic (valve) stenosis: Secondary | ICD-10-CM | POA: Diagnosis not present

## 2023-09-16 DIAGNOSIS — I330 Acute and subacute infective endocarditis: Secondary | ICD-10-CM | POA: Diagnosis not present

## 2023-09-17 ENCOUNTER — Other Ambulatory Visit: Payer: Self-pay

## 2023-09-17 ENCOUNTER — Telehealth: Payer: Self-pay

## 2023-09-17 DIAGNOSIS — I330 Acute and subacute infective endocarditis: Secondary | ICD-10-CM | POA: Diagnosis not present

## 2023-09-17 NOTE — Patient Outreach (Signed)
  Care Management  Transitions of Care Program Transitions of Care Post-discharge week 2   09/17/2023 Name: Edward Marquez MRN: [id number] DOB: [birth year]  Subjective: Edward Marquez is a 73 y.o. year old male who is a primary care patient of Allman, Elveria LABOR, MD. The Care Management team Engaged with   patients wife  to assess and address transitions of care needs.   Consent to Services:  Patient was given information about care management services, agreed to services, and gave verbal consent to participate.   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  Unknown discharge plan at this time.           SDOH Interventions    Flowsheet Row Telephone from 09/09/2023 in [place] POPULATION HEALTH DEPARTMENT Clinical Support from 08/01/2022 in [facility]  SDOH Interventions    Food Insecurity Interventions Intervention Not Indicated Intervention Not Indicated  Housing Interventions Intervention Not Indicated Intervention Not Indicated  Transportation Interventions Intervention Not Indicated Intervention Not Indicated  Utilities Interventions Intervention Not Indicated Intervention Not Indicated  Alcohol Usage Interventions -- Intervention Not Indicated (Score <7)  Financial Strain Interventions -- Intervention Not Indicated  Physical Activity Interventions -- Intervention Not Indicated  Stress Interventions -- Intervention Not Indicated  Social Connections Interventions -- Intervention Not Indicated        Goals Addressed             This Visit's Progress    COMPLETED: TOC 30 day program       Current Barriers:  Knowledge Deficits related to plan of care for management of   Streptococcal bacteremia   RNCM Clinical Goal(s):  Patient will work with the Care Management team over the next 30 days to address Transition of Care Barriers: Medication  Management take all medications exactly as prescribed and will call provider for medication related questions as evidenced by EMR attend all scheduled medical appointments: Specialist as evidenced by EMR  through collaboration with RN Care manager, provider, and care team.   Interventions: Evaluation of current treatment plan related to  self management and patient's adherence to plan as established by provider  Transitions of Care:   readmitted Doctor Visits  - discussed the importance of doctor visits  Patient Goals/Self-Care Activities: Participate in Transition of Care Program/Attend [facility] scheduled calls Notify RN Care Manager of TOC call rescheduling needs Take all medications as prescribed Attend all scheduled provider appointments Call pharmacy for medication refills 3-7 days in advance of running out of medications  Follow Up Plan:  readmitted. Pending discharge plan from [facility]         Plan:  Discharge from [facility] program due to readmission for stroke  Alan Ee, RN, BSN, CEN Population Health- Transition of Care Team.  [facility] [phone]

## 2023-09-17 NOTE — Patient Outreach (Signed)
  Care Management  Transitions of Care Program Transitions of Care Post-discharge week 2   09/17/2023 Name: Edward Marquez MRN: [id number] DOB: 09-21-1950  Subjective: Edward Marquez is a 73 y.o. year old male who is a primary care patient of Allman, Edward LABOR, MD. The Care Management team Engaged with patient's  wife  to assess and address transitions of care needs.   Consent to Services:  Patient was given information about care management services, agreed to services, and gave verbal consent to participate.   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  Unknown discharge plan at this time.          SDOH Interventions    Flowsheet Row Telephone from 09/09/2023 in [place] POPULATION HEALTH DEPARTMENT Clinical Support from 08/01/2022 in [facility] [place] [facility]  SDOH Interventions    Food Insecurity Interventions Intervention Not Indicated Intervention Not Indicated  Housing Interventions Intervention Not Indicated Intervention Not Indicated  Transportation Interventions Intervention Not Indicated Intervention Not Indicated  Utilities Interventions Intervention Not Indicated Intervention Not Indicated  Alcohol Usage Interventions -- Intervention Not Indicated (Score <7)  Financial Strain Interventions -- Intervention Not Indicated  Physical Activity Interventions -- Intervention Not Indicated  Stress Interventions -- Intervention Not Indicated  Social Connections Interventions -- Intervention Not Indicated          Plan:  discharged from [facility] program due to readmission for stroke  Alan Ee, RN, BSN, [employer]- Transition of Care Team.  [facility] [phone]

## 2023-09-18 DIAGNOSIS — I499 Cardiac arrhythmia, unspecified: Secondary | ICD-10-CM | POA: Diagnosis not present

## 2023-09-18 DIAGNOSIS — I330 Acute and subacute infective endocarditis: Secondary | ICD-10-CM | POA: Diagnosis not present

## 2023-09-18 DIAGNOSIS — I4430 Unspecified atrioventricular block: Secondary | ICD-10-CM | POA: Diagnosis not present

## 2023-09-18 DIAGNOSIS — I76 Septic arterial embolism: Secondary | ICD-10-CM | POA: Diagnosis not present

## 2023-09-18 DIAGNOSIS — I4891 Unspecified atrial fibrillation: Secondary | ICD-10-CM | POA: Diagnosis not present

## 2023-09-18 DIAGNOSIS — Z95810 Presence of automatic (implantable) cardiac defibrillator: Secondary | ICD-10-CM | POA: Diagnosis not present

## 2023-09-18 DIAGNOSIS — I5022 Chronic systolic (congestive) heart failure: Secondary | ICD-10-CM | POA: Diagnosis not present

## 2023-09-19 DIAGNOSIS — I420 Dilated cardiomyopathy: Secondary | ICD-10-CM | POA: Diagnosis not present

## 2023-09-19 DIAGNOSIS — I739 Peripheral vascular disease, unspecified: Secondary | ICD-10-CM | POA: Diagnosis not present

## 2023-09-19 DIAGNOSIS — I5022 Chronic systolic (congestive) heart failure: Secondary | ICD-10-CM | POA: Diagnosis not present

## 2023-09-19 DIAGNOSIS — I358 Other nonrheumatic aortic valve disorders: Secondary | ICD-10-CM | POA: Diagnosis not present

## 2023-09-19 DIAGNOSIS — I330 Acute and subacute infective endocarditis: Secondary | ICD-10-CM | POA: Diagnosis not present

## 2023-09-19 DIAGNOSIS — E785 Hyperlipidemia, unspecified: Secondary | ICD-10-CM | POA: Diagnosis not present

## 2023-09-19 DIAGNOSIS — I2510 Atherosclerotic heart disease of native coronary artery without angina pectoris: Secondary | ICD-10-CM | POA: Diagnosis not present

## 2023-09-19 DIAGNOSIS — R7881 Bacteremia: Secondary | ICD-10-CM | POA: Diagnosis not present

## 2023-09-19 DIAGNOSIS — I639 Cerebral infarction, unspecified: Secondary | ICD-10-CM | POA: Diagnosis not present

## 2023-09-19 DIAGNOSIS — T826XXA Infection and inflammatory reaction due to cardiac valve prosthesis, initial encounter: Secondary | ICD-10-CM | POA: Diagnosis not present

## 2023-09-19 DIAGNOSIS — I4819 Other persistent atrial fibrillation: Secondary | ICD-10-CM | POA: Diagnosis not present

## 2023-09-19 DIAGNOSIS — I76 Septic arterial embolism: Secondary | ICD-10-CM | POA: Diagnosis not present

## 2023-09-19 DIAGNOSIS — Z95810 Presence of automatic (implantable) cardiac defibrillator: Secondary | ICD-10-CM | POA: Diagnosis not present

## 2023-09-19 DIAGNOSIS — Z952 Presence of prosthetic heart valve: Secondary | ICD-10-CM | POA: Diagnosis not present

## 2023-09-20 DIAGNOSIS — Z4659 Encounter for fitting and adjustment of other gastrointestinal appliance and device: Secondary | ICD-10-CM | POA: Diagnosis not present

## 2023-09-20 DIAGNOSIS — J810 Acute pulmonary edema: Secondary | ICD-10-CM | POA: Diagnosis not present

## 2023-09-20 DIAGNOSIS — I76 Septic arterial embolism: Secondary | ICD-10-CM | POA: Diagnosis not present

## 2023-09-20 DIAGNOSIS — T826XXA Infection and inflammatory reaction due to cardiac valve prosthesis, initial encounter: Secondary | ICD-10-CM | POA: Diagnosis not present

## 2023-09-20 DIAGNOSIS — I358 Other nonrheumatic aortic valve disorders: Secondary | ICD-10-CM | POA: Diagnosis not present

## 2023-09-20 DIAGNOSIS — J90 Pleural effusion, not elsewhere classified: Secondary | ICD-10-CM | POA: Diagnosis not present

## 2023-09-21 DIAGNOSIS — J90 Pleural effusion, not elsewhere classified: Secondary | ICD-10-CM | POA: Diagnosis not present

## 2023-09-21 DIAGNOSIS — I38 Endocarditis, valve unspecified: Secondary | ICD-10-CM | POA: Diagnosis not present

## 2023-09-21 DIAGNOSIS — R931 Abnormal findings on diagnostic imaging of heart and coronary circulation: Secondary | ICD-10-CM | POA: Diagnosis not present

## 2023-09-21 DIAGNOSIS — I330 Acute and subacute infective endocarditis: Secondary | ICD-10-CM | POA: Diagnosis not present

## 2023-09-21 DIAGNOSIS — T826XXA Infection and inflammatory reaction due to cardiac valve prosthesis, initial encounter: Secondary | ICD-10-CM | POA: Diagnosis not present

## 2023-09-21 DIAGNOSIS — Z8619 Personal history of other infectious and parasitic diseases: Secondary | ICD-10-CM | POA: Diagnosis not present

## 2023-09-22 DIAGNOSIS — I330 Acute and subacute infective endocarditis: Secondary | ICD-10-CM | POA: Diagnosis not present

## 2023-09-22 DIAGNOSIS — I5020 Unspecified systolic (congestive) heart failure: Secondary | ICD-10-CM | POA: Diagnosis not present

## 2023-09-22 DIAGNOSIS — Z8619 Personal history of other infectious and parasitic diseases: Secondary | ICD-10-CM | POA: Diagnosis not present

## 2023-09-22 DIAGNOSIS — J90 Pleural effusion, not elsewhere classified: Secondary | ICD-10-CM | POA: Diagnosis not present

## 2023-09-22 DIAGNOSIS — T826XXA Infection and inflammatory reaction due to cardiac valve prosthesis, initial encounter: Secondary | ICD-10-CM | POA: Diagnosis not present

## 2023-09-22 DIAGNOSIS — I38 Endocarditis, valve unspecified: Secondary | ICD-10-CM | POA: Diagnosis not present

## 2023-09-22 DIAGNOSIS — R931 Abnormal findings on diagnostic imaging of heart and coronary circulation: Secondary | ICD-10-CM | POA: Diagnosis not present

## 2023-09-23 DIAGNOSIS — I76 Septic arterial embolism: Secondary | ICD-10-CM | POA: Diagnosis not present

## 2023-09-23 DIAGNOSIS — J9811 Atelectasis: Secondary | ICD-10-CM | POA: Diagnosis not present

## 2023-09-23 DIAGNOSIS — I330 Acute and subacute infective endocarditis: Secondary | ICD-10-CM | POA: Diagnosis not present

## 2023-09-24 DIAGNOSIS — I330 Acute and subacute infective endocarditis: Secondary | ICD-10-CM | POA: Diagnosis not present

## 2023-09-24 DIAGNOSIS — I76 Septic arterial embolism: Secondary | ICD-10-CM | POA: Diagnosis not present

## 2023-09-25 DIAGNOSIS — I76 Septic arterial embolism: Secondary | ICD-10-CM | POA: Diagnosis not present

## 2023-09-25 DIAGNOSIS — I330 Acute and subacute infective endocarditis: Secondary | ICD-10-CM | POA: Diagnosis not present

## 2023-09-26 DIAGNOSIS — T826XXA Infection and inflammatory reaction due to cardiac valve prosthesis, initial encounter: Secondary | ICD-10-CM | POA: Diagnosis not present

## 2023-09-26 DIAGNOSIS — Z950 Presence of cardiac pacemaker: Secondary | ICD-10-CM | POA: Diagnosis not present

## 2023-09-26 DIAGNOSIS — I330 Acute and subacute infective endocarditis: Secondary | ICD-10-CM | POA: Diagnosis not present

## 2023-09-26 DIAGNOSIS — R930 Abnormal findings on diagnostic imaging of skull and head, not elsewhere classified: Secondary | ICD-10-CM | POA: Diagnosis not present

## 2023-09-26 DIAGNOSIS — B954 Other streptococcus as the cause of diseases classified elsewhere: Secondary | ICD-10-CM | POA: Diagnosis not present

## 2023-09-27 DIAGNOSIS — T826XXA Infection and inflammatory reaction due to cardiac valve prosthesis, initial encounter: Secondary | ICD-10-CM | POA: Diagnosis not present

## 2023-09-27 DIAGNOSIS — R930 Abnormal findings on diagnostic imaging of skull and head, not elsewhere classified: Secondary | ICD-10-CM | POA: Diagnosis not present

## 2023-09-27 DIAGNOSIS — I330 Acute and subacute infective endocarditis: Secondary | ICD-10-CM | POA: Diagnosis not present

## 2023-09-27 DIAGNOSIS — B954 Other streptococcus as the cause of diseases classified elsewhere: Secondary | ICD-10-CM | POA: Diagnosis not present

## 2023-09-28 DIAGNOSIS — R935 Abnormal findings on diagnostic imaging of other abdominal regions, including retroperitoneum: Secondary | ICD-10-CM | POA: Diagnosis not present

## 2023-09-28 DIAGNOSIS — J9811 Atelectasis: Secondary | ICD-10-CM | POA: Diagnosis not present

## 2023-09-28 DIAGNOSIS — I76 Septic arterial embolism: Secondary | ICD-10-CM | POA: Diagnosis not present

## 2023-09-29 DIAGNOSIS — I499 Cardiac arrhythmia, unspecified: Secondary | ICD-10-CM | POA: Diagnosis not present

## 2023-09-29 DIAGNOSIS — I76 Septic arterial embolism: Secondary | ICD-10-CM | POA: Diagnosis not present

## 2023-09-30 DIAGNOSIS — I330 Acute and subacute infective endocarditis: Secondary | ICD-10-CM | POA: Diagnosis not present

## 2023-09-30 DIAGNOSIS — B954 Other streptococcus as the cause of diseases classified elsewhere: Secondary | ICD-10-CM | POA: Diagnosis not present

## 2023-09-30 DIAGNOSIS — R931 Abnormal findings on diagnostic imaging of heart and coronary circulation: Secondary | ICD-10-CM | POA: Diagnosis not present

## 2023-09-30 DIAGNOSIS — T826XXA Infection and inflammatory reaction due to cardiac valve prosthesis, initial encounter: Secondary | ICD-10-CM | POA: Diagnosis not present

## 2023-10-01 DIAGNOSIS — I5020 Unspecified systolic (congestive) heart failure: Secondary | ICD-10-CM | POA: Diagnosis not present

## 2023-10-01 DIAGNOSIS — D696 Thrombocytopenia, unspecified: Secondary | ICD-10-CM | POA: Diagnosis not present

## 2023-10-01 DIAGNOSIS — R279 Unspecified lack of coordination: Secondary | ICD-10-CM | POA: Diagnosis not present

## 2023-10-01 DIAGNOSIS — I420 Dilated cardiomyopathy: Secondary | ICD-10-CM | POA: Diagnosis not present

## 2023-10-01 DIAGNOSIS — R531 Weakness: Secondary | ICD-10-CM | POA: Diagnosis not present

## 2023-10-01 DIAGNOSIS — I4891 Unspecified atrial fibrillation: Secondary | ICD-10-CM | POA: Diagnosis not present

## 2023-10-01 DIAGNOSIS — R059 Cough, unspecified: Secondary | ICD-10-CM | POA: Diagnosis not present

## 2023-10-01 DIAGNOSIS — M6281 Muscle weakness (generalized): Secondary | ICD-10-CM | POA: Diagnosis not present

## 2023-10-01 DIAGNOSIS — D649 Anemia, unspecified: Secondary | ICD-10-CM | POA: Diagnosis not present

## 2023-10-01 DIAGNOSIS — G4700 Insomnia, unspecified: Secondary | ICD-10-CM | POA: Diagnosis not present

## 2023-10-01 DIAGNOSIS — E785 Hyperlipidemia, unspecified: Secondary | ICD-10-CM | POA: Diagnosis not present

## 2023-10-01 DIAGNOSIS — Z95810 Presence of automatic (implantable) cardiac defibrillator: Secondary | ICD-10-CM | POA: Diagnosis not present

## 2023-10-01 DIAGNOSIS — R2243 Localized swelling, mass and lump, lower limb, bilateral: Secondary | ICD-10-CM | POA: Diagnosis not present

## 2023-10-01 DIAGNOSIS — K219 Gastro-esophageal reflux disease without esophagitis: Secondary | ICD-10-CM | POA: Diagnosis not present

## 2023-10-01 DIAGNOSIS — R7881 Bacteremia: Secondary | ICD-10-CM | POA: Diagnosis not present

## 2023-10-01 DIAGNOSIS — M109 Gout, unspecified: Secondary | ICD-10-CM | POA: Diagnosis not present

## 2023-10-01 DIAGNOSIS — I10 Essential (primary) hypertension: Secondary | ICD-10-CM | POA: Diagnosis not present

## 2023-10-01 DIAGNOSIS — R52 Pain, unspecified: Secondary | ICD-10-CM | POA: Diagnosis not present

## 2023-10-01 DIAGNOSIS — H902 Conductive hearing loss, unspecified: Secondary | ICD-10-CM | POA: Diagnosis not present

## 2023-10-01 DIAGNOSIS — I4892 Unspecified atrial flutter: Secondary | ICD-10-CM | POA: Diagnosis not present

## 2023-10-01 DIAGNOSIS — M2550 Pain in unspecified joint: Secondary | ICD-10-CM | POA: Diagnosis not present

## 2023-10-01 DIAGNOSIS — I330 Acute and subacute infective endocarditis: Secondary | ICD-10-CM | POA: Diagnosis not present

## 2023-10-01 DIAGNOSIS — R058 Other specified cough: Secondary | ICD-10-CM | POA: Diagnosis not present

## 2023-10-01 DIAGNOSIS — I76 Septic arterial embolism: Secondary | ICD-10-CM | POA: Diagnosis not present

## 2023-10-01 DIAGNOSIS — Z452 Encounter for adjustment and management of vascular access device: Secondary | ICD-10-CM | POA: Diagnosis not present

## 2023-10-01 DIAGNOSIS — I5022 Chronic systolic (congestive) heart failure: Secondary | ICD-10-CM | POA: Diagnosis not present

## 2023-10-01 DIAGNOSIS — R748 Abnormal levels of other serum enzymes: Secondary | ICD-10-CM | POA: Diagnosis not present

## 2023-10-01 DIAGNOSIS — I639 Cerebral infarction, unspecified: Secondary | ICD-10-CM | POA: Diagnosis not present

## 2023-10-01 DIAGNOSIS — I499 Cardiac arrhythmia, unspecified: Secondary | ICD-10-CM | POA: Diagnosis not present

## 2023-10-01 DIAGNOSIS — T826XXD Infection and inflammatory reaction due to cardiac valve prosthesis, subsequent encounter: Secondary | ICD-10-CM | POA: Diagnosis not present

## 2023-10-01 DIAGNOSIS — M130 Polyarthritis, unspecified: Secondary | ICD-10-CM | POA: Diagnosis not present

## 2023-10-01 DIAGNOSIS — T8201XA Breakdown (mechanical) of heart valve prosthesis, initial encounter: Secondary | ICD-10-CM | POA: Diagnosis not present

## 2023-10-01 DIAGNOSIS — T827XXD Infection and inflammatory reaction due to other cardiac and vascular devices, implants and grafts, subsequent encounter: Secondary | ICD-10-CM | POA: Diagnosis not present

## 2023-10-01 DIAGNOSIS — N182 Chronic kidney disease, stage 2 (mild): Secondary | ICD-10-CM | POA: Diagnosis not present

## 2023-10-01 DIAGNOSIS — Z792 Long term (current) use of antibiotics: Secondary | ICD-10-CM | POA: Diagnosis not present

## 2023-10-01 DIAGNOSIS — N1831 Chronic kidney disease, stage 3a: Secondary | ICD-10-CM | POA: Diagnosis not present

## 2023-10-01 DIAGNOSIS — I739 Peripheral vascular disease, unspecified: Secondary | ICD-10-CM | POA: Diagnosis not present

## 2023-10-01 DIAGNOSIS — D62 Acute posthemorrhagic anemia: Secondary | ICD-10-CM | POA: Diagnosis not present

## 2023-10-01 DIAGNOSIS — Z952 Presence of prosthetic heart valve: Secondary | ICD-10-CM | POA: Diagnosis not present

## 2023-10-01 DIAGNOSIS — M25561 Pain in right knee: Secondary | ICD-10-CM | POA: Diagnosis not present

## 2023-10-01 DIAGNOSIS — T8201XD Breakdown (mechanical) of heart valve prosthesis, subsequent encounter: Secondary | ICD-10-CM | POA: Diagnosis not present

## 2023-10-01 DIAGNOSIS — I38 Endocarditis, valve unspecified: Secondary | ICD-10-CM | POA: Diagnosis not present

## 2023-10-01 DIAGNOSIS — R770 Abnormality of albumin: Secondary | ICD-10-CM | POA: Diagnosis not present

## 2023-10-02 DIAGNOSIS — E785 Hyperlipidemia, unspecified: Secondary | ICD-10-CM | POA: Diagnosis not present

## 2023-10-02 DIAGNOSIS — I10 Essential (primary) hypertension: Secondary | ICD-10-CM | POA: Diagnosis not present

## 2023-10-02 DIAGNOSIS — I4891 Unspecified atrial fibrillation: Secondary | ICD-10-CM | POA: Diagnosis not present

## 2023-10-02 DIAGNOSIS — R531 Weakness: Secondary | ICD-10-CM | POA: Diagnosis not present

## 2023-10-02 DIAGNOSIS — I330 Acute and subacute infective endocarditis: Secondary | ICD-10-CM | POA: Diagnosis not present

## 2023-10-02 DIAGNOSIS — I5020 Unspecified systolic (congestive) heart failure: Secondary | ICD-10-CM | POA: Diagnosis not present

## 2023-10-03 DIAGNOSIS — Z452 Encounter for adjustment and management of vascular access device: Secondary | ICD-10-CM | POA: Diagnosis not present

## 2023-10-03 DIAGNOSIS — I499 Cardiac arrhythmia, unspecified: Secondary | ICD-10-CM | POA: Diagnosis not present

## 2023-10-03 DIAGNOSIS — Z792 Long term (current) use of antibiotics: Secondary | ICD-10-CM | POA: Diagnosis not present

## 2023-10-03 DIAGNOSIS — I330 Acute and subacute infective endocarditis: Secondary | ICD-10-CM | POA: Diagnosis not present

## 2023-10-03 DIAGNOSIS — R531 Weakness: Secondary | ICD-10-CM | POA: Diagnosis not present

## 2023-10-04 DIAGNOSIS — I639 Cerebral infarction, unspecified: Secondary | ICD-10-CM | POA: Diagnosis not present

## 2023-10-04 DIAGNOSIS — I330 Acute and subacute infective endocarditis: Secondary | ICD-10-CM | POA: Diagnosis not present

## 2023-10-04 DIAGNOSIS — I4891 Unspecified atrial fibrillation: Secondary | ICD-10-CM | POA: Diagnosis not present

## 2023-10-04 DIAGNOSIS — T8201XA Breakdown (mechanical) of heart valve prosthesis, initial encounter: Secondary | ICD-10-CM | POA: Diagnosis not present

## 2023-10-04 DIAGNOSIS — I4892 Unspecified atrial flutter: Secondary | ICD-10-CM | POA: Diagnosis not present

## 2023-10-07 DIAGNOSIS — Z452 Encounter for adjustment and management of vascular access device: Secondary | ICD-10-CM | POA: Diagnosis not present

## 2023-10-07 DIAGNOSIS — I330 Acute and subacute infective endocarditis: Secondary | ICD-10-CM | POA: Diagnosis not present

## 2023-10-07 DIAGNOSIS — R2243 Localized swelling, mass and lump, lower limb, bilateral: Secondary | ICD-10-CM | POA: Diagnosis not present

## 2023-10-07 DIAGNOSIS — Z792 Long term (current) use of antibiotics: Secondary | ICD-10-CM | POA: Diagnosis not present

## 2023-10-07 DIAGNOSIS — R531 Weakness: Secondary | ICD-10-CM | POA: Diagnosis not present

## 2023-10-07 DIAGNOSIS — G4700 Insomnia, unspecified: Secondary | ICD-10-CM | POA: Diagnosis not present

## 2023-10-09 DIAGNOSIS — R531 Weakness: Secondary | ICD-10-CM | POA: Diagnosis not present

## 2023-10-09 DIAGNOSIS — Z792 Long term (current) use of antibiotics: Secondary | ICD-10-CM | POA: Diagnosis not present

## 2023-10-09 DIAGNOSIS — R2243 Localized swelling, mass and lump, lower limb, bilateral: Secondary | ICD-10-CM | POA: Diagnosis not present

## 2023-10-09 DIAGNOSIS — G4700 Insomnia, unspecified: Secondary | ICD-10-CM | POA: Diagnosis not present

## 2023-10-09 DIAGNOSIS — I330 Acute and subacute infective endocarditis: Secondary | ICD-10-CM | POA: Diagnosis not present

## 2023-10-09 DIAGNOSIS — Z452 Encounter for adjustment and management of vascular access device: Secondary | ICD-10-CM | POA: Diagnosis not present

## 2023-10-15 ENCOUNTER — Other Ambulatory Visit: Payer: Medicare Other

## 2023-10-15 DIAGNOSIS — R531 Weakness: Secondary | ICD-10-CM | POA: Diagnosis not present

## 2023-10-15 DIAGNOSIS — R748 Abnormal levels of other serum enzymes: Secondary | ICD-10-CM | POA: Diagnosis not present

## 2023-10-15 DIAGNOSIS — T827XXD Infection and inflammatory reaction due to other cardiac and vascular devices, implants and grafts, subsequent encounter: Secondary | ICD-10-CM | POA: Diagnosis not present

## 2023-10-15 DIAGNOSIS — I330 Acute and subacute infective endocarditis: Secondary | ICD-10-CM | POA: Diagnosis not present

## 2023-10-15 DIAGNOSIS — D696 Thrombocytopenia, unspecified: Secondary | ICD-10-CM | POA: Diagnosis not present

## 2023-10-15 DIAGNOSIS — D649 Anemia, unspecified: Secondary | ICD-10-CM | POA: Diagnosis not present

## 2023-10-15 DIAGNOSIS — Z792 Long term (current) use of antibiotics: Secondary | ICD-10-CM | POA: Diagnosis not present

## 2023-10-15 DIAGNOSIS — T826XXD Infection and inflammatory reaction due to cardiac valve prosthesis, subsequent encounter: Secondary | ICD-10-CM | POA: Diagnosis not present

## 2023-10-15 DIAGNOSIS — Z452 Encounter for adjustment and management of vascular access device: Secondary | ICD-10-CM | POA: Diagnosis not present

## 2023-10-15 DIAGNOSIS — G4700 Insomnia, unspecified: Secondary | ICD-10-CM | POA: Diagnosis not present

## 2023-10-15 DIAGNOSIS — I38 Endocarditis, valve unspecified: Secondary | ICD-10-CM | POA: Diagnosis not present

## 2023-10-16 ENCOUNTER — Other Ambulatory Visit: Payer: Self-pay | Admitting: *Deleted

## 2023-10-16 DIAGNOSIS — I330 Acute and subacute infective endocarditis: Secondary | ICD-10-CM | POA: Diagnosis not present

## 2023-10-16 DIAGNOSIS — R531 Weakness: Secondary | ICD-10-CM | POA: Diagnosis not present

## 2023-10-16 DIAGNOSIS — G4700 Insomnia, unspecified: Secondary | ICD-10-CM | POA: Diagnosis not present

## 2023-10-16 NOTE — Patient Outreach (Signed)
 Mr. Suastegui resides in [facility].  Screening for potential complex care management services as benefit of health plan and primary care provider.  Telephonic collaborative meeting with Stevan [facility] Care discharge planner. Mr. Clauss is receiving iv abx until March 20th. Discussed Clinical research associate was previously followed by [facility] TOC RN CM. Will plan outreach to discuss [facility] complex care management services.   Will continue to follow.   Raiford Noble, MSN, RN, BSN   [facility], [facility] RN Post- Acute Care Manager Direct Dial: [phone]

## 2023-10-21 DIAGNOSIS — Z792 Long term (current) use of antibiotics: Secondary | ICD-10-CM | POA: Diagnosis not present

## 2023-10-21 DIAGNOSIS — N1831 Chronic kidney disease, stage 3a: Secondary | ICD-10-CM | POA: Diagnosis not present

## 2023-10-21 DIAGNOSIS — R770 Abnormality of albumin: Secondary | ICD-10-CM | POA: Diagnosis not present

## 2023-10-21 DIAGNOSIS — D649 Anemia, unspecified: Secondary | ICD-10-CM | POA: Diagnosis not present

## 2023-10-21 DIAGNOSIS — R531 Weakness: Secondary | ICD-10-CM | POA: Diagnosis not present

## 2023-10-21 DIAGNOSIS — I330 Acute and subacute infective endocarditis: Secondary | ICD-10-CM | POA: Diagnosis not present

## 2023-10-24 DIAGNOSIS — Z792 Long term (current) use of antibiotics: Secondary | ICD-10-CM | POA: Diagnosis not present

## 2023-10-24 DIAGNOSIS — M25561 Pain in right knee: Secondary | ICD-10-CM | POA: Diagnosis not present

## 2023-10-24 DIAGNOSIS — R531 Weakness: Secondary | ICD-10-CM | POA: Diagnosis not present

## 2023-10-25 DIAGNOSIS — R531 Weakness: Secondary | ICD-10-CM | POA: Diagnosis not present

## 2023-10-25 DIAGNOSIS — R52 Pain, unspecified: Secondary | ICD-10-CM | POA: Diagnosis not present

## 2023-10-29 DIAGNOSIS — R531 Weakness: Secondary | ICD-10-CM | POA: Diagnosis not present

## 2023-10-29 DIAGNOSIS — D649 Anemia, unspecified: Secondary | ICD-10-CM | POA: Diagnosis not present

## 2023-10-29 DIAGNOSIS — R058 Other specified cough: Secondary | ICD-10-CM | POA: Diagnosis not present

## 2023-10-29 DIAGNOSIS — N182 Chronic kidney disease, stage 2 (mild): Secondary | ICD-10-CM | POA: Diagnosis not present

## 2023-10-29 DIAGNOSIS — Z792 Long term (current) use of antibiotics: Secondary | ICD-10-CM | POA: Diagnosis not present

## 2023-10-30 ENCOUNTER — Other Ambulatory Visit: Payer: Self-pay | Admitting: *Deleted

## 2023-10-30 DIAGNOSIS — R531 Weakness: Secondary | ICD-10-CM | POA: Diagnosis not present

## 2023-10-30 DIAGNOSIS — R058 Other specified cough: Secondary | ICD-10-CM | POA: Diagnosis not present

## 2023-10-30 NOTE — Patient Outreach (Signed)
 Post-Acute Care Manager follow up. Mr. Edward Marquez resides in [facility]. Screening or potential complex care management needs as benefit of health plan.  Collaboration meeting with [facility] discharge planning team, Wilkie Aye and [place]. Mr. Edward Marquez is slated to return home on Friday, March 21st. Will have [facility] home [facility].   Spoke with Mr. Edward Marquez at [facility]. Mr. Edward Marquez confirms his PCP is with [facility]. States he has been extremely pleased with his care and therapy at [facility]. Reports he lives at home with spouse [name]. Reports having close follow up with his physicians.   Discussed Clinical research associate will not refer for [facility] complex care management since PCP is not in the [facility] network. Mr. Edward Marquez expressed understanding. Mr. Edward Marquez very knowledgeable about his health care management. Understands he will also have [facility] home health upon discharge.   Will not refer for [facility] after all due to PCP not being in [facility] network. Mr. Edward Marquez is aware and reports having good follow up with his [facility] providers.   Edward Noble, MSN, RN, BSN [place]  [facility], [facility] RN Post- Acute Care Manager Direct Dial: [phone]

## 2023-11-01 DIAGNOSIS — R531 Weakness: Secondary | ICD-10-CM | POA: Diagnosis not present

## 2023-11-01 DIAGNOSIS — I330 Acute and subacute infective endocarditis: Secondary | ICD-10-CM | POA: Diagnosis not present

## 2023-11-02 DIAGNOSIS — H9193 Unspecified hearing loss, bilateral: Secondary | ICD-10-CM | POA: Diagnosis not present

## 2023-11-02 DIAGNOSIS — I330 Acute and subacute infective endocarditis: Secondary | ICD-10-CM | POA: Diagnosis not present

## 2023-11-02 DIAGNOSIS — I130 Hypertensive heart and chronic kidney disease with heart failure and stage 1 through stage 4 chronic kidney disease, or unspecified chronic kidney disease: Secondary | ICD-10-CM | POA: Diagnosis not present

## 2023-11-02 DIAGNOSIS — I429 Cardiomyopathy, unspecified: Secondary | ICD-10-CM | POA: Diagnosis not present

## 2023-11-02 DIAGNOSIS — Z95810 Presence of automatic (implantable) cardiac defibrillator: Secondary | ICD-10-CM | POA: Diagnosis not present

## 2023-11-02 DIAGNOSIS — I4892 Unspecified atrial flutter: Secondary | ICD-10-CM | POA: Diagnosis not present

## 2023-11-02 DIAGNOSIS — Z952 Presence of prosthetic heart valve: Secondary | ICD-10-CM | POA: Diagnosis not present

## 2023-11-02 DIAGNOSIS — E785 Hyperlipidemia, unspecified: Secondary | ICD-10-CM | POA: Diagnosis not present

## 2023-11-02 DIAGNOSIS — I4891 Unspecified atrial fibrillation: Secondary | ICD-10-CM | POA: Diagnosis not present

## 2023-11-02 DIAGNOSIS — Z7982 Long term (current) use of aspirin: Secondary | ICD-10-CM | POA: Diagnosis not present

## 2023-11-02 DIAGNOSIS — I5020 Unspecified systolic (congestive) heart failure: Secondary | ICD-10-CM | POA: Diagnosis not present

## 2023-11-02 DIAGNOSIS — N1831 Chronic kidney disease, stage 3a: Secondary | ICD-10-CM | POA: Diagnosis not present

## 2023-11-02 DIAGNOSIS — Z8673 Personal history of transient ischemic attack (TIA), and cerebral infarction without residual deficits: Secondary | ICD-10-CM | POA: Diagnosis not present

## 2023-11-02 DIAGNOSIS — Z7901 Long term (current) use of anticoagulants: Secondary | ICD-10-CM | POA: Diagnosis not present

## 2023-11-02 DIAGNOSIS — Z792 Long term (current) use of antibiotics: Secondary | ICD-10-CM | POA: Diagnosis not present

## 2023-11-02 DIAGNOSIS — I739 Peripheral vascular disease, unspecified: Secondary | ICD-10-CM | POA: Diagnosis not present

## 2023-11-02 DIAGNOSIS — I714 Abdominal aortic aneurysm, without rupture, unspecified: Secondary | ICD-10-CM | POA: Diagnosis not present

## 2023-11-02 DIAGNOSIS — H9319 Tinnitus, unspecified ear: Secondary | ICD-10-CM | POA: Diagnosis not present

## 2023-11-02 DIAGNOSIS — T826XXA Infection and inflammatory reaction due to cardiac valve prosthesis, initial encounter: Secondary | ICD-10-CM | POA: Diagnosis not present

## 2023-11-04 DIAGNOSIS — Z8673 Personal history of transient ischemic attack (TIA), and cerebral infarction without residual deficits: Secondary | ICD-10-CM | POA: Diagnosis not present

## 2023-11-04 DIAGNOSIS — Z95810 Presence of automatic (implantable) cardiac defibrillator: Secondary | ICD-10-CM | POA: Diagnosis not present

## 2023-11-04 DIAGNOSIS — I5022 Chronic systolic (congestive) heart failure: Secondary | ICD-10-CM | POA: Diagnosis not present

## 2023-11-04 DIAGNOSIS — I480 Paroxysmal atrial fibrillation: Secondary | ICD-10-CM | POA: Diagnosis not present

## 2023-11-04 DIAGNOSIS — I358 Other nonrheumatic aortic valve disorders: Secondary | ICD-10-CM | POA: Diagnosis not present

## 2023-11-05 DIAGNOSIS — I714 Abdominal aortic aneurysm, without rupture, unspecified: Secondary | ICD-10-CM | POA: Diagnosis not present

## 2023-11-05 DIAGNOSIS — I5020 Unspecified systolic (congestive) heart failure: Secondary | ICD-10-CM | POA: Diagnosis not present

## 2023-11-05 DIAGNOSIS — N1831 Chronic kidney disease, stage 3a: Secondary | ICD-10-CM | POA: Diagnosis not present

## 2023-11-05 DIAGNOSIS — Z95810 Presence of automatic (implantable) cardiac defibrillator: Secondary | ICD-10-CM | POA: Diagnosis not present

## 2023-11-05 DIAGNOSIS — I130 Hypertensive heart and chronic kidney disease with heart failure and stage 1 through stage 4 chronic kidney disease, or unspecified chronic kidney disease: Secondary | ICD-10-CM | POA: Diagnosis not present

## 2023-11-05 DIAGNOSIS — I4892 Unspecified atrial flutter: Secondary | ICD-10-CM | POA: Diagnosis not present

## 2023-11-05 DIAGNOSIS — Z952 Presence of prosthetic heart valve: Secondary | ICD-10-CM | POA: Diagnosis not present

## 2023-11-05 DIAGNOSIS — Z7982 Long term (current) use of aspirin: Secondary | ICD-10-CM | POA: Diagnosis not present

## 2023-11-05 DIAGNOSIS — T826XXA Infection and inflammatory reaction due to cardiac valve prosthesis, initial encounter: Secondary | ICD-10-CM | POA: Diagnosis not present

## 2023-11-05 DIAGNOSIS — H9319 Tinnitus, unspecified ear: Secondary | ICD-10-CM | POA: Diagnosis not present

## 2023-11-05 DIAGNOSIS — E785 Hyperlipidemia, unspecified: Secondary | ICD-10-CM | POA: Diagnosis not present

## 2023-11-05 DIAGNOSIS — I429 Cardiomyopathy, unspecified: Secondary | ICD-10-CM | POA: Diagnosis not present

## 2023-11-05 DIAGNOSIS — I4891 Unspecified atrial fibrillation: Secondary | ICD-10-CM | POA: Diagnosis not present

## 2023-11-05 DIAGNOSIS — I330 Acute and subacute infective endocarditis: Secondary | ICD-10-CM | POA: Diagnosis not present

## 2023-11-05 DIAGNOSIS — Z792 Long term (current) use of antibiotics: Secondary | ICD-10-CM | POA: Diagnosis not present

## 2023-11-05 DIAGNOSIS — I739 Peripheral vascular disease, unspecified: Secondary | ICD-10-CM | POA: Diagnosis not present

## 2023-11-05 DIAGNOSIS — Z8673 Personal history of transient ischemic attack (TIA), and cerebral infarction without residual deficits: Secondary | ICD-10-CM | POA: Diagnosis not present

## 2023-11-05 DIAGNOSIS — Z7901 Long term (current) use of anticoagulants: Secondary | ICD-10-CM | POA: Diagnosis not present

## 2023-11-05 DIAGNOSIS — H9193 Unspecified hearing loss, bilateral: Secondary | ICD-10-CM | POA: Diagnosis not present

## 2023-11-11 DIAGNOSIS — Z792 Long term (current) use of antibiotics: Secondary | ICD-10-CM | POA: Diagnosis not present

## 2023-11-11 DIAGNOSIS — I739 Peripheral vascular disease, unspecified: Secondary | ICD-10-CM | POA: Diagnosis not present

## 2023-11-11 DIAGNOSIS — H9319 Tinnitus, unspecified ear: Secondary | ICD-10-CM | POA: Diagnosis not present

## 2023-11-11 DIAGNOSIS — H9193 Unspecified hearing loss, bilateral: Secondary | ICD-10-CM | POA: Diagnosis not present

## 2023-11-11 DIAGNOSIS — N1831 Chronic kidney disease, stage 3a: Secondary | ICD-10-CM | POA: Diagnosis not present

## 2023-11-11 DIAGNOSIS — I429 Cardiomyopathy, unspecified: Secondary | ICD-10-CM | POA: Diagnosis not present

## 2023-11-11 DIAGNOSIS — Z8673 Personal history of transient ischemic attack (TIA), and cerebral infarction without residual deficits: Secondary | ICD-10-CM | POA: Diagnosis not present

## 2023-11-11 DIAGNOSIS — I4892 Unspecified atrial flutter: Secondary | ICD-10-CM | POA: Diagnosis not present

## 2023-11-11 DIAGNOSIS — Z7901 Long term (current) use of anticoagulants: Secondary | ICD-10-CM | POA: Diagnosis not present

## 2023-11-11 DIAGNOSIS — T826XXA Infection and inflammatory reaction due to cardiac valve prosthesis, initial encounter: Secondary | ICD-10-CM | POA: Diagnosis not present

## 2023-11-11 DIAGNOSIS — I130 Hypertensive heart and chronic kidney disease with heart failure and stage 1 through stage 4 chronic kidney disease, or unspecified chronic kidney disease: Secondary | ICD-10-CM | POA: Diagnosis not present

## 2023-11-11 DIAGNOSIS — Z952 Presence of prosthetic heart valve: Secondary | ICD-10-CM | POA: Diagnosis not present

## 2023-11-11 DIAGNOSIS — I330 Acute and subacute infective endocarditis: Secondary | ICD-10-CM | POA: Diagnosis not present

## 2023-11-11 DIAGNOSIS — Z7982 Long term (current) use of aspirin: Secondary | ICD-10-CM | POA: Diagnosis not present

## 2023-11-11 DIAGNOSIS — I714 Abdominal aortic aneurysm, without rupture, unspecified: Secondary | ICD-10-CM | POA: Diagnosis not present

## 2023-11-11 DIAGNOSIS — E785 Hyperlipidemia, unspecified: Secondary | ICD-10-CM | POA: Diagnosis not present

## 2023-11-11 DIAGNOSIS — I5020 Unspecified systolic (congestive) heart failure: Secondary | ICD-10-CM | POA: Diagnosis not present

## 2023-11-11 DIAGNOSIS — I4891 Unspecified atrial fibrillation: Secondary | ICD-10-CM | POA: Diagnosis not present

## 2023-11-11 DIAGNOSIS — Z95810 Presence of automatic (implantable) cardiac defibrillator: Secondary | ICD-10-CM | POA: Diagnosis not present

## 2023-11-12 DIAGNOSIS — Z952 Presence of prosthetic heart valve: Secondary | ICD-10-CM | POA: Diagnosis not present

## 2023-11-12 DIAGNOSIS — I739 Peripheral vascular disease, unspecified: Secondary | ICD-10-CM | POA: Diagnosis not present

## 2023-11-12 DIAGNOSIS — Z7982 Long term (current) use of aspirin: Secondary | ICD-10-CM | POA: Diagnosis not present

## 2023-11-12 DIAGNOSIS — N1831 Chronic kidney disease, stage 3a: Secondary | ICD-10-CM | POA: Diagnosis not present

## 2023-11-12 DIAGNOSIS — Z95810 Presence of automatic (implantable) cardiac defibrillator: Secondary | ICD-10-CM | POA: Diagnosis not present

## 2023-11-12 DIAGNOSIS — H9319 Tinnitus, unspecified ear: Secondary | ICD-10-CM | POA: Diagnosis not present

## 2023-11-12 DIAGNOSIS — I429 Cardiomyopathy, unspecified: Secondary | ICD-10-CM | POA: Diagnosis not present

## 2023-11-12 DIAGNOSIS — T826XXA Infection and inflammatory reaction due to cardiac valve prosthesis, initial encounter: Secondary | ICD-10-CM | POA: Diagnosis not present

## 2023-11-12 DIAGNOSIS — E785 Hyperlipidemia, unspecified: Secondary | ICD-10-CM | POA: Diagnosis not present

## 2023-11-12 DIAGNOSIS — H9193 Unspecified hearing loss, bilateral: Secondary | ICD-10-CM | POA: Diagnosis not present

## 2023-11-12 DIAGNOSIS — Z7901 Long term (current) use of anticoagulants: Secondary | ICD-10-CM | POA: Diagnosis not present

## 2023-11-12 DIAGNOSIS — I130 Hypertensive heart and chronic kidney disease with heart failure and stage 1 through stage 4 chronic kidney disease, or unspecified chronic kidney disease: Secondary | ICD-10-CM | POA: Diagnosis not present

## 2023-11-12 DIAGNOSIS — Z792 Long term (current) use of antibiotics: Secondary | ICD-10-CM | POA: Diagnosis not present

## 2023-11-12 DIAGNOSIS — Z8673 Personal history of transient ischemic attack (TIA), and cerebral infarction without residual deficits: Secondary | ICD-10-CM | POA: Diagnosis not present

## 2023-11-12 DIAGNOSIS — I330 Acute and subacute infective endocarditis: Secondary | ICD-10-CM | POA: Diagnosis not present

## 2023-11-12 DIAGNOSIS — I714 Abdominal aortic aneurysm, without rupture, unspecified: Secondary | ICD-10-CM | POA: Diagnosis not present

## 2023-11-12 DIAGNOSIS — I4892 Unspecified atrial flutter: Secondary | ICD-10-CM | POA: Diagnosis not present

## 2023-11-12 DIAGNOSIS — I4891 Unspecified atrial fibrillation: Secondary | ICD-10-CM | POA: Diagnosis not present

## 2023-11-12 DIAGNOSIS — I5020 Unspecified systolic (congestive) heart failure: Secondary | ICD-10-CM | POA: Diagnosis not present

## 2023-11-19 DIAGNOSIS — Z7982 Long term (current) use of aspirin: Secondary | ICD-10-CM | POA: Diagnosis not present

## 2023-11-19 DIAGNOSIS — Z952 Presence of prosthetic heart valve: Secondary | ICD-10-CM | POA: Diagnosis not present

## 2023-11-19 DIAGNOSIS — I429 Cardiomyopathy, unspecified: Secondary | ICD-10-CM | POA: Diagnosis not present

## 2023-11-19 DIAGNOSIS — Z8673 Personal history of transient ischemic attack (TIA), and cerebral infarction without residual deficits: Secondary | ICD-10-CM | POA: Diagnosis not present

## 2023-11-19 DIAGNOSIS — I130 Hypertensive heart and chronic kidney disease with heart failure and stage 1 through stage 4 chronic kidney disease, or unspecified chronic kidney disease: Secondary | ICD-10-CM | POA: Diagnosis not present

## 2023-11-19 DIAGNOSIS — I5020 Unspecified systolic (congestive) heart failure: Secondary | ICD-10-CM | POA: Diagnosis not present

## 2023-11-19 DIAGNOSIS — T826XXA Infection and inflammatory reaction due to cardiac valve prosthesis, initial encounter: Secondary | ICD-10-CM | POA: Diagnosis not present

## 2023-11-19 DIAGNOSIS — Z95810 Presence of automatic (implantable) cardiac defibrillator: Secondary | ICD-10-CM | POA: Diagnosis not present

## 2023-11-19 DIAGNOSIS — N1831 Chronic kidney disease, stage 3a: Secondary | ICD-10-CM | POA: Diagnosis not present

## 2023-11-19 DIAGNOSIS — I4891 Unspecified atrial fibrillation: Secondary | ICD-10-CM | POA: Diagnosis not present

## 2023-11-19 DIAGNOSIS — I4892 Unspecified atrial flutter: Secondary | ICD-10-CM | POA: Diagnosis not present

## 2023-11-19 DIAGNOSIS — E785 Hyperlipidemia, unspecified: Secondary | ICD-10-CM | POA: Diagnosis not present

## 2023-11-19 DIAGNOSIS — I330 Acute and subacute infective endocarditis: Secondary | ICD-10-CM | POA: Diagnosis not present

## 2023-11-19 DIAGNOSIS — H9193 Unspecified hearing loss, bilateral: Secondary | ICD-10-CM | POA: Diagnosis not present

## 2023-11-19 DIAGNOSIS — Z7901 Long term (current) use of anticoagulants: Secondary | ICD-10-CM | POA: Diagnosis not present

## 2023-11-19 DIAGNOSIS — I739 Peripheral vascular disease, unspecified: Secondary | ICD-10-CM | POA: Diagnosis not present

## 2023-11-19 DIAGNOSIS — I714 Abdominal aortic aneurysm, without rupture, unspecified: Secondary | ICD-10-CM | POA: Diagnosis not present

## 2023-11-19 DIAGNOSIS — Z792 Long term (current) use of antibiotics: Secondary | ICD-10-CM | POA: Diagnosis not present

## 2023-11-19 DIAGNOSIS — H9319 Tinnitus, unspecified ear: Secondary | ICD-10-CM | POA: Diagnosis not present

## 2023-11-21 DIAGNOSIS — H9193 Unspecified hearing loss, bilateral: Secondary | ICD-10-CM | POA: Diagnosis not present

## 2023-11-21 DIAGNOSIS — N1831 Chronic kidney disease, stage 3a: Secondary | ICD-10-CM | POA: Diagnosis not present

## 2023-11-21 DIAGNOSIS — Z7901 Long term (current) use of anticoagulants: Secondary | ICD-10-CM | POA: Diagnosis not present

## 2023-11-21 DIAGNOSIS — I739 Peripheral vascular disease, unspecified: Secondary | ICD-10-CM | POA: Diagnosis not present

## 2023-11-21 DIAGNOSIS — I130 Hypertensive heart and chronic kidney disease with heart failure and stage 1 through stage 4 chronic kidney disease, or unspecified chronic kidney disease: Secondary | ICD-10-CM | POA: Diagnosis not present

## 2023-11-21 DIAGNOSIS — I5020 Unspecified systolic (congestive) heart failure: Secondary | ICD-10-CM | POA: Diagnosis not present

## 2023-11-21 DIAGNOSIS — Z7982 Long term (current) use of aspirin: Secondary | ICD-10-CM | POA: Diagnosis not present

## 2023-11-21 DIAGNOSIS — I714 Abdominal aortic aneurysm, without rupture, unspecified: Secondary | ICD-10-CM | POA: Diagnosis not present

## 2023-11-21 DIAGNOSIS — I4891 Unspecified atrial fibrillation: Secondary | ICD-10-CM | POA: Diagnosis not present

## 2023-11-21 DIAGNOSIS — Z792 Long term (current) use of antibiotics: Secondary | ICD-10-CM | POA: Diagnosis not present

## 2023-11-21 DIAGNOSIS — T826XXA Infection and inflammatory reaction due to cardiac valve prosthesis, initial encounter: Secondary | ICD-10-CM | POA: Diagnosis not present

## 2023-11-21 DIAGNOSIS — Z95810 Presence of automatic (implantable) cardiac defibrillator: Secondary | ICD-10-CM | POA: Diagnosis not present

## 2023-11-21 DIAGNOSIS — Z8673 Personal history of transient ischemic attack (TIA), and cerebral infarction without residual deficits: Secondary | ICD-10-CM | POA: Diagnosis not present

## 2023-11-21 DIAGNOSIS — I330 Acute and subacute infective endocarditis: Secondary | ICD-10-CM | POA: Diagnosis not present

## 2023-11-21 DIAGNOSIS — Z952 Presence of prosthetic heart valve: Secondary | ICD-10-CM | POA: Diagnosis not present

## 2023-11-21 DIAGNOSIS — H9319 Tinnitus, unspecified ear: Secondary | ICD-10-CM | POA: Diagnosis not present

## 2023-11-21 DIAGNOSIS — I4892 Unspecified atrial flutter: Secondary | ICD-10-CM | POA: Diagnosis not present

## 2023-11-21 DIAGNOSIS — E785 Hyperlipidemia, unspecified: Secondary | ICD-10-CM | POA: Diagnosis not present

## 2023-11-21 DIAGNOSIS — I429 Cardiomyopathy, unspecified: Secondary | ICD-10-CM | POA: Diagnosis not present

## 2023-11-22 DIAGNOSIS — H9193 Unspecified hearing loss, bilateral: Secondary | ICD-10-CM | POA: Diagnosis not present

## 2023-11-22 DIAGNOSIS — I5020 Unspecified systolic (congestive) heart failure: Secondary | ICD-10-CM | POA: Diagnosis not present

## 2023-11-22 DIAGNOSIS — N1831 Chronic kidney disease, stage 3a: Secondary | ICD-10-CM | POA: Diagnosis not present

## 2023-11-22 DIAGNOSIS — I4891 Unspecified atrial fibrillation: Secondary | ICD-10-CM | POA: Diagnosis not present

## 2023-11-22 DIAGNOSIS — H9319 Tinnitus, unspecified ear: Secondary | ICD-10-CM | POA: Diagnosis not present

## 2023-11-22 DIAGNOSIS — I4892 Unspecified atrial flutter: Secondary | ICD-10-CM | POA: Diagnosis not present

## 2023-11-22 DIAGNOSIS — E785 Hyperlipidemia, unspecified: Secondary | ICD-10-CM | POA: Diagnosis not present

## 2023-11-22 DIAGNOSIS — Z7901 Long term (current) use of anticoagulants: Secondary | ICD-10-CM | POA: Diagnosis not present

## 2023-11-22 DIAGNOSIS — I130 Hypertensive heart and chronic kidney disease with heart failure and stage 1 through stage 4 chronic kidney disease, or unspecified chronic kidney disease: Secondary | ICD-10-CM | POA: Diagnosis not present

## 2023-11-22 DIAGNOSIS — Z7982 Long term (current) use of aspirin: Secondary | ICD-10-CM | POA: Diagnosis not present

## 2023-11-22 DIAGNOSIS — I429 Cardiomyopathy, unspecified: Secondary | ICD-10-CM | POA: Diagnosis not present

## 2023-11-22 DIAGNOSIS — I739 Peripheral vascular disease, unspecified: Secondary | ICD-10-CM | POA: Diagnosis not present

## 2023-11-22 DIAGNOSIS — Z792 Long term (current) use of antibiotics: Secondary | ICD-10-CM | POA: Diagnosis not present

## 2023-11-22 DIAGNOSIS — Z952 Presence of prosthetic heart valve: Secondary | ICD-10-CM | POA: Diagnosis not present

## 2023-11-22 DIAGNOSIS — Z8673 Personal history of transient ischemic attack (TIA), and cerebral infarction without residual deficits: Secondary | ICD-10-CM | POA: Diagnosis not present

## 2023-11-22 DIAGNOSIS — T826XXA Infection and inflammatory reaction due to cardiac valve prosthesis, initial encounter: Secondary | ICD-10-CM | POA: Diagnosis not present

## 2023-11-22 DIAGNOSIS — Z95810 Presence of automatic (implantable) cardiac defibrillator: Secondary | ICD-10-CM | POA: Diagnosis not present

## 2023-11-22 DIAGNOSIS — I714 Abdominal aortic aneurysm, without rupture, unspecified: Secondary | ICD-10-CM | POA: Diagnosis not present

## 2023-11-22 DIAGNOSIS — I330 Acute and subacute infective endocarditis: Secondary | ICD-10-CM | POA: Diagnosis not present

## 2023-11-25 DIAGNOSIS — N1831 Chronic kidney disease, stage 3a: Secondary | ICD-10-CM | POA: Diagnosis not present

## 2023-11-25 DIAGNOSIS — H9319 Tinnitus, unspecified ear: Secondary | ICD-10-CM | POA: Diagnosis not present

## 2023-11-25 DIAGNOSIS — I4891 Unspecified atrial fibrillation: Secondary | ICD-10-CM | POA: Diagnosis not present

## 2023-11-25 DIAGNOSIS — Z792 Long term (current) use of antibiotics: Secondary | ICD-10-CM | POA: Diagnosis not present

## 2023-11-25 DIAGNOSIS — I429 Cardiomyopathy, unspecified: Secondary | ICD-10-CM | POA: Diagnosis not present

## 2023-11-25 DIAGNOSIS — I714 Abdominal aortic aneurysm, without rupture, unspecified: Secondary | ICD-10-CM | POA: Diagnosis not present

## 2023-11-25 DIAGNOSIS — Z952 Presence of prosthetic heart valve: Secondary | ICD-10-CM | POA: Diagnosis not present

## 2023-11-25 DIAGNOSIS — Z7901 Long term (current) use of anticoagulants: Secondary | ICD-10-CM | POA: Diagnosis not present

## 2023-11-25 DIAGNOSIS — I330 Acute and subacute infective endocarditis: Secondary | ICD-10-CM | POA: Diagnosis not present

## 2023-11-25 DIAGNOSIS — I5020 Unspecified systolic (congestive) heart failure: Secondary | ICD-10-CM | POA: Diagnosis not present

## 2023-11-25 DIAGNOSIS — I739 Peripheral vascular disease, unspecified: Secondary | ICD-10-CM | POA: Diagnosis not present

## 2023-11-25 DIAGNOSIS — Z95810 Presence of automatic (implantable) cardiac defibrillator: Secondary | ICD-10-CM | POA: Diagnosis not present

## 2023-11-25 DIAGNOSIS — E785 Hyperlipidemia, unspecified: Secondary | ICD-10-CM | POA: Diagnosis not present

## 2023-11-25 DIAGNOSIS — H9193 Unspecified hearing loss, bilateral: Secondary | ICD-10-CM | POA: Diagnosis not present

## 2023-11-25 DIAGNOSIS — I4892 Unspecified atrial flutter: Secondary | ICD-10-CM | POA: Diagnosis not present

## 2023-11-25 DIAGNOSIS — Z7982 Long term (current) use of aspirin: Secondary | ICD-10-CM | POA: Diagnosis not present

## 2023-11-25 DIAGNOSIS — T826XXA Infection and inflammatory reaction due to cardiac valve prosthesis, initial encounter: Secondary | ICD-10-CM | POA: Diagnosis not present

## 2023-11-25 DIAGNOSIS — Z8673 Personal history of transient ischemic attack (TIA), and cerebral infarction without residual deficits: Secondary | ICD-10-CM | POA: Diagnosis not present

## 2023-11-25 DIAGNOSIS — I130 Hypertensive heart and chronic kidney disease with heart failure and stage 1 through stage 4 chronic kidney disease, or unspecified chronic kidney disease: Secondary | ICD-10-CM | POA: Diagnosis not present

## 2023-11-26 DIAGNOSIS — I38 Endocarditis, valve unspecified: Secondary | ICD-10-CM | POA: Diagnosis not present

## 2023-11-26 DIAGNOSIS — T826XXD Infection and inflammatory reaction due to cardiac valve prosthesis, subsequent encounter: Secondary | ICD-10-CM | POA: Diagnosis not present

## 2023-11-26 DIAGNOSIS — Z792 Long term (current) use of antibiotics: Secondary | ICD-10-CM | POA: Diagnosis not present

## 2023-11-28 DIAGNOSIS — I4819 Other persistent atrial fibrillation: Secondary | ICD-10-CM | POA: Diagnosis not present

## 2023-11-28 DIAGNOSIS — Z952 Presence of prosthetic heart valve: Secondary | ICD-10-CM | POA: Diagnosis not present

## 2023-11-28 DIAGNOSIS — R42 Dizziness and giddiness: Secondary | ICD-10-CM | POA: Diagnosis not present

## 2023-11-28 DIAGNOSIS — Z4502 Encounter for adjustment and management of automatic implantable cardiac defibrillator: Secondary | ICD-10-CM | POA: Diagnosis not present

## 2023-11-28 DIAGNOSIS — Z8673 Personal history of transient ischemic attack (TIA), and cerebral infarction without residual deficits: Secondary | ICD-10-CM | POA: Diagnosis not present

## 2023-12-10 ENCOUNTER — Ambulatory Visit: Attending: Family Medicine | Admitting: Physical Therapy

## 2023-12-10 ENCOUNTER — Other Ambulatory Visit: Payer: Self-pay

## 2023-12-10 DIAGNOSIS — R2681 Unsteadiness on feet: Secondary | ICD-10-CM | POA: Diagnosis not present

## 2023-12-10 DIAGNOSIS — R2689 Other abnormalities of gait and mobility: Secondary | ICD-10-CM | POA: Diagnosis not present

## 2023-12-10 DIAGNOSIS — I4819 Other persistent atrial fibrillation: Secondary | ICD-10-CM | POA: Diagnosis not present

## 2023-12-10 DIAGNOSIS — R42 Dizziness and giddiness: Secondary | ICD-10-CM | POA: Diagnosis not present

## 2023-12-10 DIAGNOSIS — Z4502 Encounter for adjustment and management of automatic implantable cardiac defibrillator: Secondary | ICD-10-CM | POA: Diagnosis not present

## 2023-12-10 NOTE — Therapy (Signed)
 OUTPATIENT PHYSICAL THERAPY VESTIBULAR EVALUATION     Patient Name: Edward Marquez MRN: [id number] DOB:Mar 29, 1951, 73 y.o., male Today's Date: 12/11/2023  END OF SESSION:  PT End of Session - 12/10/23 1241     Visit Number 1    Number of Visits 17    Date for PT Re-Evaluation 02/07/24    Authorization Type [employer] Medicare-auth submitted at eval    Progress Note Due on Visit 10    PT Start Time 1238   pt arrives late   PT Stop Time 1320    PT Time Calculation (min) 42 min    Equipment Utilized During Treatment --   gait belt needed due to imbalance   Activity Tolerance Patient tolerated treatment well    Behavior During Therapy WFL for tasks assessed/performed             Past Medical History:  Diagnosis Date   Aortic valve defect    Atrial fibrillation (HCC)    CHF (congestive heart failure) (HCC)    Clotting disorder (HCC)    Gout    Hypertension    Peripheral vascular disease (HCC)    Past Surgical History:  Procedure Laterality Date   ABDOMINAL AORTOGRAM W/LOWER EXTREMITY N/A 05/03/2017   Procedure: ABDOMINAL AORTOGRAM W/LOWER EXTREMITY;  Surgeon: Dannis Dy, MD;  Location: [facility] INVASIVE CV LAB;  Service: Cardiovascular;  Laterality: N/A;   AORTIC VALVE REPLACEMENT     BYPASS GRAFT POPLITEAL TO TIBIAL Left 05/03/2017   Procedure: LEFT POPLITEAL TO ANTERIOR TIBIAL ARTERY BYPASS WITH VEIN;  Surgeon: Dannis Dy, MD;  Location: [facility] OR;  Service: Vascular;  Laterality: Left;   LOWER EXTREMITY ANGIOGRAPHY N/A 06/24/2017   Procedure: LOWER EXTREMITY ANGIOGRAPHY - Right;  Surgeon: Dannis Dy, MD;  Location: [facility] INVASIVE CV LAB;  Service: Cardiovascular;  Laterality: N/A;   PACEMAKER INSERTION     PERIPHERAL VASCULAR BALLOON ANGIOPLASTY  05/03/2017   Procedure: PERIPHERAL VASCULAR BALLOON ANGIOPLASTY;  Surgeon: Dannis Dy, MD;  Location: [facility] INVASIVE CV LAB;  Service: Cardiovascular;;   PERIPHERAL VASCULAR BALLOON ANGIOPLASTY Right  06/24/2017   Procedure: PERIPHERAL VASCULAR BALLOON ANGIOPLASTY;  Surgeon: Dannis Dy, MD;  Location: [facility] INVASIVE CV LAB;  Service: Cardiovascular;  Laterality: Right;  ant-tibial / perneal   VEIN HARVEST Left 05/03/2017   Procedure: POPITEAL VEIN HARVEST;  Surgeon: Dannis Dy, MD;  Location: [facility] OR;  Service: Vascular;  Laterality: Left;   Patient Active Problem List   Diagnosis Date Noted   Acute endocarditis 09/02/2023   Bacterial endocarditis 09/02/2023   Streptococcal bacteremia 09/02/2023   Positive blood culture 09/01/2023   Stage 3a chronic kidney disease (CKD) (HCC) 09/01/2023   CAP (community acquired pneumonia) 08/31/2023   Positive colorectal cancer screening using Cologuard test 08/29/2022   Hyperlipidemia LDL goal <70 07/31/2022   Hammer toes, bilateral 07/31/2022   Effusion, left knee 03/14/2022   Atrial fibrillation (HCC) 08/14/2017   History of implantable cardioverter-defibrillator (ICD) placement 08/14/2017   PAD (peripheral artery disease) (HCC) 05/03/2017   PVD (peripheral vascular disease) (HCC) 04/30/2017    PCP: Daved Eriksson, MD  REFERRING PROVIDER: Daved Eriksson, MD   REFERRING DIAG:  [phone] (ICD-10-CM) - Personal history of transient ischemic attack (TIA), and cerebral infarction without residual deficits  R42 (ICD-10-CM) - Dizziness and giddiness    THERAPY DIAG:  Unsteadiness on feet  Other abnormalities of gait and mobility  Dizziness and giddiness  ONSET DATE: 12/05/2023  Rationale for Evaluation and Treatment: Rehabilitation  SUBJECTIVE:   SUBJECTIVE STATEMENT: Was very sick November-December 2024, then in January increasingly ill and weak.  Had to go to ED in January and hds septic shock, had endocarditis, the strep attached to the St. Jude valve and had to have open heart surgery to replace the mechanical valves/inner working of heart.  Had a CVA due to the vegetation breaking off of the valve.  Were  active-riding horses, hiking, lived [place] farm.  He has come along way from when discharged from hospital.  More difficulty towards the end of the day, does have some vision issues, gets wider and "waddling" with walking, more towards end of day.  More difficulty on uneven surfaces.  With vision-when I look straight ahead and walk, things are moving up and down. Had rehab and home health. 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  PMH includesAAA, A-fib, Cardiomyopathy, CHF, hx of aortic valve replacement, HTN, HLD, ICD, PVD, CVA, tinnitus  PAIN:  Are you having pain? No  PRECAUTIONS: Fall and Other: Avoid lifting 20#    Has ICD; pt did have sternal precautions, but he reports they have been lifted. RED FLAGS: None   WEIGHT BEARING RESTRICTIONS: No  FALLS: Has patient fallen in last 6 months? Yes. Number of falls 1  LIVING ENVIRONMENT: Lives with: lives with their spouse Lives in: House/apartment Stairs:  5 steps to enter front; 12 steps into second floor (bedroom); rails Has following equipment at home: Single point cane and Walker - 2 wheeled  PLOF: Independent  PATIENT GOALS: To get back to normal, prior level of activities  OBJECTIVE:  Note: Objective measures were completed at Evaluation unless otherwise noted.  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   SYMPTOM BEHAVIOR:  Subjective history: See above-wife provided most of hx.  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  Since the CVA, he has had visual disturbances with standing and walking-more vertical motion of eyes and blurred vision.  Non-Vestibular symptoms: tinnitus  Type of dizziness: Imbalance (Disequilibrium), Oscillopsia, and Unsteady with head/body turns  Frequency: throughout the day   Duration: more as he gets tired  Aggravating factors: Induced by motion: looking up at the ceiling, turning body quickly, and turning head quickly and Worse with fatigue  Relieving factors: slow movements  Progression of symptoms: better  OCULOMOTOR EXAM:  Ocular Alignment: normal  Ocular ROM: No Limitations  Spontaneous Nystagmus: absent  Gaze-Induced Nystagmus: right beating with right gaze and left beating with left gaze  Smooth Pursuits: intact  Saccades: intact  Convergence/Divergence: 2-3 cm    VESTIBULAR - OCULAR REFLEX:   Slow VOR: Positive Bilaterally horizontal and vertical; blurriness  VOR Cancellation: Comment: no symptoms horizontal; would incr symptoms vertical  Head-Impulse Test: HIT Right: slowed HIT Left: slowed      MOTION SENSITIVITY: *NOT TESTED AT EVAL*  Motion Sensitivity Quotient Intensity: 0 = none, 1 = Lightheaded, 2 = Mild, 3 = Moderate, 4 = Severe, 5 = Vomiting  Intensity  1. Sitting to supine   2. Supine to L side   3. Supine to R side   4. Supine to sitting   5. L Hallpike-Dix   6. Up from L    7. R Hallpike-Dix   8. Up from R    9. Sitting, head tipped to L knee   10. Head up from L knee   11. Sitting, head tipped to R knee   12.  Head up from R knee   13. Sitting head turns x5   14.Sitting head nods x5   15. In stance, 180 turn to L    16. In stance, 180 turn to R      FUNCTIONAL GAIT: 10 meter walk test: 19.91 sec  = 1.65 ft/sec   M-CTSIB  Condition 1: Firm Surface, EO 30 Sec, Normal Sway  Condition 2: Firm Surface, EC 30 Sec, Mild Sway  Condition 3: Foam Surface, EO 30 Sec, Mild Sway  Condition 4: Foam Surface, EC 13.44 Sec, Severe Sway                                                                                                                              TREATMENT DATE: 12/10/2023    PATIENT EDUCATION: Education details: Eval results, POC; continue current exercises that patient was doing at home for standing VOR Person educated: Patient and Spouse Education method: Explanation Education comprehension: verbalized understanding  HOME EXERCISE PROGRAM:  GOALS: Goals reviewed with patient? Yes  SHORT TERM GOALS: Target date: 01/10/2024  Pt will be independent with HEP for improved balance, gait. Baseline: Goal status: INITIAL  2.  Pt will improve gait velocity to at least 1.8 ft/sec for improved gait efficiency and safety. Baseline: 1.65 ft/sec Goal status: INITIAL  3.  Pt will perform Condition 4 on MCTSIB for 30 sec with mod sway for improved overall balance. Baseline: 13.34 sec Goal status: INITIAL  LONG TERM GOALS: Target date: 02/07/2024  Pt will be independent with advanced HEP for improved balance, gait. Baseline:  Goal status: INITIAL  2.  Pt will improve gait velocity to at least 2.3 ft/sec for improved gait efficiency and safety. Baseline: 1.65 ft/sec Goal status: INITIAL  3.  Pt will improve MCTSIB Condition 4 to mild sway for 30 seconds for improved overall balance. Baseline: 13.34 sec Goal status: INITIAL  4.  FGA to be assessed and pt to improve to at least 22/30 for decreased fall risk. Baseline: TBD Goal status: INITIAL  5.  Pt will ambulate at least 1000 ft,  outdoor surfaces, least restrictive device, mod I, for return to walking [place] and  hiking activities. Baseline:  Goal status: INITIAL  6.  DHI score to improve to 10 or less for decreased dizziness impacting function. Baseline: 28 Goal status: INITIAL  ASSESSMENT:  CLINICAL IMPRESSION: Patient is a 73 y.o. male who was seen today for physical therapy evaluation and treatment for cerebellar CVA with imbalance, dizziness.  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  He had rehab at [facility], followed by home health physical therapy.  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  He and wife describe a good deal of improvement since CVA; however, he continues to have dizziness and imbalance.  He presents with decreased balance, decreased independence with gait, decreased timing and coordination with gait.  With oculomotor testing, he demo end range nystagmus that is direction changing.  With VOR, he has difficulty sequencing and timing VOR at adequate speed, but can do slowly.  He does describe symptoms with vertical VOR cancellation.  With gait and motion, he describes vertical oscillopsia and blurred vision.  He will benefit from skilled PT to address the above stated deficits for improved balance, functional mobility and return to independent PLOF.  OBJECTIVE IMPAIRMENTS: Abnormal gait, decreased balance, decreased mobility, difficulty walking, and impaired vision/preception.   ACTIVITY LIMITATIONS: reach over head, locomotion level, and caring for others  PARTICIPATION LIMITATIONS: meal prep, cleaning, laundry, driving, shopping, community activity, yard work, and hiking, horseback riding  PERSONAL FACTORS: 3+ comorbidities: see above PMH and recent medical hx  are also  affecting patient's functional outcome.   REHAB POTENTIAL: Good  CLINICAL DECISION MAKING: Evolving/moderate complexity  EVALUATION COMPLEXITY: Moderate   PLAN:  PT FREQUENCY: 2x/week  PT DURATION: 8 weeks plus eval  PLANNED INTERVENTIONS: 97750- Physical Performance Testing, 97110-Therapeutic exercises, 97530- Therapeutic activity, 97112- Neuromuscular re-education, 97535- Self Care, [id number]- Manual therapy, [phone]- Gait training, Patient/Family education, Balance training, Stair training, and Vestibular training  PLAN FOR NEXT SESSION: Assess FGA (or DGI) and any other vestibular measures; initial HEP for balance, VOR, VOR cancellation, multi-sensory balance exercise   Aysha Livecchi W., PT 12/11/2023, 11:56 AM   [facility] at [facility] [address] [place], [place] [address] Phone # [phone] Fax # [phone]

## 2023-12-11 ENCOUNTER — Encounter: Payer: Self-pay | Admitting: Physical Therapy

## 2023-12-14 NOTE — Therapy (Incomplete)
 OUTPATIENT PHYSICAL THERAPY VESTIBULAR TREATMENT     Patient Name: Edward Marquez MRN: [id number] DOB:[birth year], 73 y.o., male Today's Date: 12/16/2023  END OF SESSION:  PT End of Session - 12/16/23 0923     Visit Number 2    Number of Visits 17    Date for PT Re-Evaluation 02/07/24    Authorization Type [employer] Medicare    Authorization Time Period approved 17 PT visits from 12/10/2023-02/04/2024.    Progress Note Due on Visit 10    PT Start Time [phone]    PT Stop Time 0924    PT Time Calculation (min) 42 min    Equipment Utilized During Treatment Gait belt   gait belt needed due to imbalance   Activity Tolerance Patient tolerated treatment well    Behavior During Therapy WFL for tasks assessed/performed              Past Medical History:  Diagnosis Date   Aortic valve defect    Atrial fibrillation (HCC)    CHF (congestive heart failure) (HCC)    Clotting disorder (HCC)    Gout    Hypertension    Peripheral vascular disease (HCC)    Past Surgical History:  Procedure Laterality Date   ABDOMINAL AORTOGRAM W/LOWER EXTREMITY N/A 05/03/2017   Procedure: ABDOMINAL AORTOGRAM W/LOWER EXTREMITY;  Surgeon: Dannis Dy, MD;  Location: [facility] INVASIVE CV LAB;  Service: Cardiovascular;  Laterality: N/A;   AORTIC VALVE REPLACEMENT     BYPASS GRAFT POPLITEAL TO TIBIAL Left 05/03/2017   Procedure: LEFT POPLITEAL TO ANTERIOR TIBIAL ARTERY BYPASS WITH VEIN;  Surgeon: Dannis Dy, MD;  Location: [facility] OR;  Service: Vascular;  Laterality: Left;   LOWER EXTREMITY ANGIOGRAPHY N/A 06/24/2017   Procedure: LOWER EXTREMITY ANGIOGRAPHY - Right;  Surgeon: Dannis Dy, MD;  Location: [facility] INVASIVE CV LAB;  Service: Cardiovascular;  Laterality: N/A;   PACEMAKER INSERTION     PERIPHERAL VASCULAR BALLOON ANGIOPLASTY  05/03/2017   Procedure: PERIPHERAL VASCULAR BALLOON ANGIOPLASTY;  Surgeon: Dannis Dy, MD;  Location: [facility] INVASIVE CV LAB;  Service: Cardiovascular;;    PERIPHERAL VASCULAR BALLOON ANGIOPLASTY Right 06/24/2017   Procedure: PERIPHERAL VASCULAR BALLOON ANGIOPLASTY;  Surgeon: Dannis Dy, MD;  Location: [facility] INVASIVE CV LAB;  Service: Cardiovascular;  Laterality: Right;  ant-tibial / perneal   VEIN HARVEST Left 05/03/2017   Procedure: POPITEAL VEIN HARVEST;  Surgeon: Dannis Dy, MD;  Location: [facility] OR;  Service: Vascular;  Laterality: Left;   Patient Active Problem List   Diagnosis Date Noted   Acute endocarditis 09/02/2023   Bacterial endocarditis 09/02/2023   Streptococcal bacteremia 09/02/2023   Positive blood culture 09/01/2023   Stage 3a chronic kidney disease (CKD) (HCC) 09/01/2023   CAP (community acquired pneumonia) 08/31/2023   Positive colorectal cancer screening using Cologuard test 08/29/2022   Hyperlipidemia LDL goal <70 07/31/2022   Hammer toes, bilateral 07/31/2022   Effusion, left knee 03/14/2022   Atrial fibrillation (HCC) 08/14/2017   History of implantable cardioverter-defibrillator (ICD) placement 08/14/2017   PAD (peripheral artery disease) (HCC) 05/03/2017   PVD (peripheral vascular disease) (HCC) 04/30/2017    PCP: Daved Eriksson, MD  REFERRING PROVIDER: Daved Eriksson, MD   REFERRING DIAG:  [phone] (ICD-10-CM) - Personal history of transient ischemic attack (TIA), and cerebral infarction without residual deficits  R42 (ICD-10-CM) - Dizziness and giddiness    THERAPY DIAG:  Unsteadiness on feet  Other abnormalities of gait and mobility  Dizziness and giddiness  ONSET DATE: 12/05/2023  Rationale for Evaluation and Treatment: Rehabilitation  SUBJECTIVE:   SUBJECTIVE STATEMENT: Up and down motion with walking is still the motion that gets me. Reports that things are getting more in-focus.    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  PMH includesAAA, A-fib, Cardiomyopathy, CHF, hx of aortic valve replacement, HTN, HLD, ICD, PVD, CVA, tinnitus  PAIN:  Are you having pain? No  PRECAUTIONS: Fall and Other: Avoid lifting 20#    Has ICD; pt did have sternal precautions, but he reports they have been lifted. RED FLAGS: None   WEIGHT BEARING RESTRICTIONS: No  FALLS: Has patient fallen in last 6 months? Yes. Number of falls 1  LIVING ENVIRONMENT: Lives with: lives with their spouse Lives in: House/apartment Stairs:  5 steps to enter front; 12 steps into second floor (bedroom); rails Has following equipment at home: Single point cane and Walker - 2 wheeled  PLOF: Independent  PATIENT GOALS: To get back to normal, prior level of activities  OBJECTIVE:      TODAY'S TREATMENT: 12/16/23 Activity Comments     FGA  14/30  HIT + B sides   standing VOR horizontal  Adjusting speed to ensure small retinal slip  standing VOR vertical More challenging   romberg EC 2x30" Mild-mod sway, improved with practice   romberg EC + head turns/nods 30" each  Weaning UE support          [facility] PT Assessment - 12/16/23 0001       Functional Gait  Assessment   Gait assessed  Yes    Gait Level Surface Walks 20 ft, slow speed, abnormal gait pattern, evidence for imbalance or deviates 10-15 in outside of the 12 in walkway width. Requires more than 7 sec to ambulate 20 ft.    Change in Gait Speed Makes only minor adjustments to walking speed, or accomplishes a change in speed with significant gait deviations, deviates 10-15 in outside the 12 in walkway width, or changes speed but loses balance but is able to recover and continue walking.    Gait with Horizontal Head Turns Performs head turns smoothly with slight change in gait velocity (eg, minor disruption to smooth gait path), deviates 6-10 in outside 12 in walkway width, or uses an assistive device.    Gait with Vertical Head Turns  Performs task with moderate change in gait velocity, slows down, deviates 10-15 in outside 12 in walkway width but recovers, can continue to walk.    Gait and Pivot Turn Pivot turns safely in greater than 3 sec and stops with no loss of balance, or pivot turns safely within 3 sec and stops with mild imbalance, requires small steps to catch balance.    Step Over Obstacle Is able to step over one shoe box (4.5 in total height) without changing gait speed. No evidence of imbalance.    Gait with Narrow Base of Support Ambulates less than 4 steps heel to toe or cannot perform without assistance.    Gait with Eyes Closed Walks 20 ft, slow speed, abnormal gait pattern, evidence for imbalance, deviates 10-15 in outside 12 in walkway width. Requires more than 9 sec to ambulate 20 ft.    Ambulating Backwards Walks 20 ft, uses assistive device, slower speed, mild gait deviations, deviates 6-10 in outside 12 in walkway width.  Steps Alternating feet, must use rail.    Total Score 14              HOME EXERCISE PROGRAM Last updated: 12/16/23 Access Code: [email or web address] URL: [email or web address] Date: 12/16/2023 Prepared by: [facility] Out[facility]  Re[facility] - [facility]  Program Notes perform in a corner or at counter top for safety  Exercises - Standing Gaze Stabilization with Head Rotation  - 1 x daily - 5 x weekly - 2-3 sets - 30 sec hold - Standing Gaze Stabilization with Head Nod  - 1 x daily - 5 x weekly - 2-3 sets - 30 sec hold - Romberg Stance with Eyes Closed  - 1 x daily - 5 x weekly - 2-3 sets - 30 sec hold - Corner Balance Feet Together: Eyes Closed With Head Turns  - 1 x daily - 5 x weekly - 2-3 sets - 30 sec hold - Romberg Stance with Head Nods  - 1 x daily - 5 x weekly - 2-3 sets - 30 sec hold    PATIENT EDUCATION: Education details: exam findings, HEP with edu for safety; edu on VOR loss B, answered pt's question on prognosis of VOR and balance  Person educated:  Patient Education method: Explanation, Demonstration, Tactile cues, Verbal cues, and Handouts Education comprehension: verbalized understanding and returned demonstration    Note: Objective measures were completed at Evaluation unless otherwise noted.  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   SYMPTOM BEHAVIOR:  Subjective history: See above-wife provided most of hx.  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  Since the CVA, he has had visual disturbances with standing and walking-more vertical motion of eyes and blurred vision.  Non-Vestibular symptoms: tinnitus  Type of dizziness: Imbalance (Disequilibrium), Oscillopsia, and Unsteady with head/body turns  Frequency: throughout the day   Duration: more as he gets tired  Aggravating factors: Induced by motion: looking up at the ceiling, turning body quickly, and turning head quickly and Worse with fatigue  Relieving factors: slow movements  Progression of symptoms: better  OCULOMOTOR EXAM:  Ocular Alignment: normal  Ocular ROM: No Limitations  Spontaneous Nystagmus: absent  Gaze-Induced Nystagmus: right beating with right gaze and left beating with left gaze  Smooth Pursuits: intact  Saccades:  intact  Convergence/Divergence: 2-3 cm    VESTIBULAR - OCULAR REFLEX:   Slow VOR: Positive Bilaterally horizontal and vertical; blurriness  VOR Cancellation: Comment: no symptoms horizontal; would incr symptoms vertical  Head-Impulse Test: HIT Right: slowed HIT Left: slowed      MOTION SENSITIVITY: *NOT TESTED AT EVAL*  Motion Sensitivity Quotient Intensity: 0 = none, 1 = Lightheaded, 2 = Mild, 3 = Moderate, 4 = Severe, 5 = Vomiting  Intensity  1. Sitting to supine   2. Supine to L side   3. Supine to R  side   4. Supine to sitting   5. L Hallpike-Dix   6. Up from L    7. R Hallpike-Dix   8. Up from R    9. Sitting, head tipped to L knee   10. Head up from L knee   11. Sitting, head tipped to R knee   12. Head up from R knee   13. Sitting head turns x5   14.Sitting head nods x5   15. In stance, 180 turn to L    16. In stance, 180 turn to R      FUNCTIONAL GAIT: 10 meter walk test: 19.91 sec  = 1.65 ft/sec   M-CTSIB  Condition 1: Firm Surface, EO 30 Sec, Normal Sway  Condition 2: Firm Surface, EC 30 Sec, Mild Sway  Condition 3: Foam Surface, EO 30 Sec, Mild Sway  Condition 4: Foam Surface, EC 13.44 Sec, Severe Sway                                                                                                                              TREATMENT DATE: 12/10/2023    PATIENT EDUCATION: Education details: Eval results, POC; continue current exercises that patient was doing at home for standing VOR Person educated: Patient and Spouse Education method: Explanation Education comprehension: verbalized understanding  HOME EXERCISE PROGRAM:  GOALS: Goals reviewed with patient? Yes  SHORT TERM GOALS: Target date: 01/10/2024  Pt will be independent with HEP for improved balance, gait. Baseline: Goal status: IN PROGRESS  2.  Pt will improve gait velocity to at least 1.8 ft/sec for improved gait efficiency and safety. Baseline: 1.65 ft/sec Goal status: IN  PROGRESS  3.  Pt will perform Condition 4 on MCTSIB for 30 sec with mod sway for improved overall balance. Baseline: 13.34 sec Goal status: IN PROGRESS  LONG TERM GOALS: Target date: 02/07/2024  Pt will be independent with advanced HEP for improved balance, gait. Baseline:  Goal status: IN PROGRESS  2.  Pt will improve gait velocity to at least 2.3 ft/sec for improved gait efficiency and safety. Baseline: 1.65 ft/sec Goal status: IN PROGRESS  3.  Pt will improve MCTSIB Condition 4 to mild sway for 30 seconds for improved overall balance. Baseline: 13.34 sec Goal status: IN PROGRESS  4.  FGA to be assessed and pt to improve to at least 22/30 for decreased fall risk. Baseline: 14/30 12/16/23 Goal status: IN PROGRESS 12/16/23  5.  Pt will ambulate at least 1000 ft, outdoor surfaces, least restrictive device, mod I, for return to walking [place] and hiking activities. Baseline:  Goal status: IN PROGRESS  6.  DHI score to improve to 10 or less for decreased dizziness impacting function. Baseline: 28  Goal status: IN PROGRESS  ASSESSMENT:  CLINICAL IMPRESSION: Patient arrived to session with report of improvement in oscillopsia. Patient scored 14/30 on FGA, indicating an increased risk of falls. Most difficulty with head turns, EC, and narrow  BOS. Initiated HEP for VOR and multisensory balance with edu for safety. Patient reported good understanding of these exercises and without complaints upon leaving.   OBJECTIVE IMPAIRMENTS: Abnormal gait, decreased balance, decreased mobility, difficulty walking, and impaired vision/preception.   ACTIVITY LIMITATIONS: reach over head, locomotion level, and caring for others  PARTICIPATION LIMITATIONS: meal prep, cleaning, laundry, driving, shopping, community activity, yard work, and hiking, horseback riding  PERSONAL FACTORS: 3+ comorbidities: see above PMH and recent medical hx  are also affecting patient's functional outcome.   REHAB  POTENTIAL: Good  CLINICAL DECISION MAKING: Evolving/moderate complexity  EVALUATION COMPLEXITY: Moderate   PLAN:  PT FREQUENCY: 2x/week  PT DURATION: 8 weeks plus eval  PLANNED INTERVENTIONS: 97750- Physical Performance Testing, 97110-Therapeutic exercises, 97530- Therapeutic activity, 97112- Neuromuscular re-education, 97535- Self Care, [id number]- Manual therapy, [phone]- Gait training, Patient/Family education, Balance training, Stair training, and Vestibular training  PLAN FOR NEXT SESSION: review HEP for balance, VOR, VOR cancellation, multi-sensory balance exercise   Thaddeus Filippo, PT, DPT 12/16/23 9:31 AM  [facility] at [facility] [address] [place], [place] [address] Phone # [phone] Fax # [phone]

## 2023-12-16 ENCOUNTER — Ambulatory Visit: Attending: Family Medicine | Admitting: Physical Therapy

## 2023-12-16 ENCOUNTER — Encounter: Payer: Self-pay | Admitting: Physical Therapy

## 2023-12-16 DIAGNOSIS — R2681 Unsteadiness on feet: Secondary | ICD-10-CM | POA: Diagnosis not present

## 2023-12-16 DIAGNOSIS — R42 Dizziness and giddiness: Secondary | ICD-10-CM | POA: Insufficient documentation

## 2023-12-16 DIAGNOSIS — R2689 Other abnormalities of gait and mobility: Secondary | ICD-10-CM | POA: Insufficient documentation

## 2023-12-19 ENCOUNTER — Ambulatory Visit: Admitting: Rehabilitative and Restorative Service Providers"

## 2023-12-19 DIAGNOSIS — H903 Sensorineural hearing loss, bilateral: Secondary | ICD-10-CM | POA: Diagnosis not present

## 2023-12-23 DIAGNOSIS — H0100A Unspecified blepharitis right eye, upper and lower eyelids: Secondary | ICD-10-CM | POA: Diagnosis not present

## 2023-12-23 DIAGNOSIS — H524 Presbyopia: Secondary | ICD-10-CM | POA: Diagnosis not present

## 2023-12-23 DIAGNOSIS — H0100B Unspecified blepharitis left eye, upper and lower eyelids: Secondary | ICD-10-CM | POA: Diagnosis not present

## 2023-12-23 DIAGNOSIS — H04123 Dry eye syndrome of bilateral lacrimal glands: Secondary | ICD-10-CM | POA: Diagnosis not present

## 2023-12-23 DIAGNOSIS — I69898 Other sequelae of other cerebrovascular disease: Secondary | ICD-10-CM | POA: Diagnosis not present

## 2023-12-24 ENCOUNTER — Ambulatory Visit

## 2023-12-24 DIAGNOSIS — R42 Dizziness and giddiness: Secondary | ICD-10-CM | POA: Diagnosis not present

## 2023-12-24 DIAGNOSIS — R2681 Unsteadiness on feet: Secondary | ICD-10-CM | POA: Diagnosis not present

## 2023-12-24 DIAGNOSIS — R2689 Other abnormalities of gait and mobility: Secondary | ICD-10-CM

## 2023-12-24 NOTE — Therapy (Signed)
 OUTPATIENT PHYSICAL THERAPY VESTIBULAR TREATMENT     Patient Name: Edward Marquez MRN: [id number] DOB:05/28/1951, 73 y.o., male Today's Date: 12/24/2023  END OF SESSION:  PT End of Session - 12/24/23 1537     Visit Number 3    Number of Visits 17    Date for PT Re-Evaluation 02/07/24    Authorization Type [employer] Medicare    Authorization Time Period approved 17 PT visits from 12/10/2023-02/04/2024.    Progress Note Due on Visit 10    PT Start Time 1536    PT Stop Time 1615    PT Time Calculation (min) 39 min    Equipment Utilized During Treatment Gait belt   gait belt needed due to imbalance   Activity Tolerance Patient tolerated treatment well    Behavior During Therapy WFL for tasks assessed/performed              Past Medical History:  Diagnosis Date   Aortic valve defect    Atrial fibrillation (HCC)    CHF (congestive heart failure) (HCC)    Clotting disorder (HCC)    Gout    Hypertension    Peripheral vascular disease (HCC)    Past Surgical History:  Procedure Laterality Date   ABDOMINAL AORTOGRAM W/LOWER EXTREMITY N/A 05/03/2017   Procedure: ABDOMINAL AORTOGRAM W/LOWER EXTREMITY;  Surgeon: Dannis Dy, MD;  Location: [facility] INVASIVE CV LAB;  Service: Cardiovascular;  Laterality: N/A;   AORTIC VALVE REPLACEMENT     BYPASS GRAFT POPLITEAL TO TIBIAL Left 05/03/2017   Procedure: LEFT POPLITEAL TO ANTERIOR TIBIAL ARTERY BYPASS WITH VEIN;  Surgeon: Dannis Dy, MD;  Location: [facility] OR;  Service: Vascular;  Laterality: Left;   LOWER EXTREMITY ANGIOGRAPHY N/A 06/24/2017   Procedure: LOWER EXTREMITY ANGIOGRAPHY - Right;  Surgeon: Dannis Dy, MD;  Location: [facility] INVASIVE CV LAB;  Service: Cardiovascular;  Laterality: N/A;   PACEMAKER INSERTION     PERIPHERAL VASCULAR BALLOON ANGIOPLASTY  05/03/2017   Procedure: PERIPHERAL VASCULAR BALLOON ANGIOPLASTY;  Surgeon: Dannis Dy, MD;  Location: [facility] INVASIVE CV LAB;  Service: Cardiovascular;;    PERIPHERAL VASCULAR BALLOON ANGIOPLASTY Right 06/24/2017   Procedure: PERIPHERAL VASCULAR BALLOON ANGIOPLASTY;  Surgeon: Dannis Dy, MD;  Location: [facility] INVASIVE CV LAB;  Service: Cardiovascular;  Laterality: Right;  ant-tibial / perneal   VEIN HARVEST Left 05/03/2017   Procedure: POPITEAL VEIN HARVEST;  Surgeon: Dannis Dy, MD;  Location: [facility] OR;  Service: Vascular;  Laterality: Left;   Patient Active Problem List   Diagnosis Date Noted   Acute endocarditis 09/02/2023   Bacterial endocarditis 09/02/2023   Streptococcal bacteremia 09/02/2023   Positive blood culture 09/01/2023   Stage 3a chronic kidney disease (CKD) (HCC) 09/01/2023   CAP (community acquired pneumonia) 08/31/2023   Positive colorectal cancer screening using Cologuard test 08/29/2022   Hyperlipidemia LDL goal <70 07/31/2022   Hammer toes, bilateral 07/31/2022   Effusion, left knee 03/14/2022   Atrial fibrillation (HCC) 08/14/2017   History of implantable cardioverter-defibrillator (ICD) placement 08/14/2017   PAD (peripheral artery disease) (HCC) 05/03/2017   PVD (peripheral vascular disease) (HCC) 04/30/2017    PCP: Daved Eriksson, MD  REFERRING PROVIDER: Daved Eriksson, MD   REFERRING DIAG:  [phone] (ICD-10-CM) - Personal history of transient ischemic attack (TIA), and cerebral infarction without residual deficits  R42 (ICD-10-CM) - Dizziness and giddiness    THERAPY DIAG:  Unsteadiness on feet  Other abnormalities of gait and mobility  Dizziness and giddiness  ONSET DATE: 12/05/2023  Rationale for Evaluation and Treatment: Rehabilitation  SUBJECTIVE:   SUBJECTIVE STATEMENT: Some of the visual distortions have improved. Been walking outdoors and notice the environment is still bouncy.    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  PMH includesAAA, A-fib, Cardiomyopathy, CHF, hx of aortic valve replacement, HTN, HLD, ICD, PVD, CVA, tinnitus  PAIN:  Are you having pain? No  PRECAUTIONS: Fall and Other: Avoid lifting 20#    Has ICD; pt did have sternal precautions, but he reports they have been lifted. RED FLAGS: None   WEIGHT BEARING RESTRICTIONS: No  FALLS: Has patient fallen in last 6 months? Yes. Number of falls 1  LIVING ENVIRONMENT: Lives with: lives with their spouse Lives in: House/apartment Stairs: 5 steps to enter front; 12 steps into second floor (bedroom); rails Has following equipment at home: Single point cane and Walker - 2 wheeled  PLOF: Independent  PATIENT GOALS: To get back to normal, prior level of activities  OBJECTIVE:    TODAY'S TREATMENT: 12/24/23 Activity Comments  Romberg EO/EC Mild sway  VOR x 1 3x 30 sec Horizontal: cues for speed/fluidity. Up to 80 bpm Vertical: up to 80 bpm  Standing VOR cancellation   Tandem walk/retro walk Unable to cross midline  Standing on foam: EO/EC Initially significant retro-LOB with eyes closed conditions, improved compensation thereafter         TODAY'S TREATMENT: 12/16/23 Activity Comments     FGA  14/30  HIT + B sides   standing VOR horizontal  Adjusting speed to ensure small retinal slip  standing VOR vertical More challenging   romberg EC 2x30" Mild-mod sway, improved with practice   romberg EC + head turns/nods 30" each  Weaning UE support             HOME EXERCISE PROGRAM Last updated: 12/16/23 Access Code: [email or web address] URL: [email or web address] Date: 12/16/2023 Prepared by: [facility] - Outpatient  Rehab - [facility]  Program Notes perform in a corner or at counter top for safety  Exercises - Standing Gaze Stabilization with Head Rotation  - 1 x daily - 5 x weekly - 2-3 sets - 30 sec hold - Standing Gaze Stabilization with Head Nod  - 1 x daily - 5 x weekly  - 2-3 sets - 30 sec hold - Romberg Stance with Eyes Closed  - 1 x daily - 5 x weekly - 2-3 sets - 30 sec hold - Corner Balance Feet Together: Eyes Closed With Head Turns  - 1 x daily - 5 x weekly - 2-3 sets - 30 sec hold - Romberg Stance with Head Nods  - 1 x daily - 5 x weekly - 2-3 sets - 30 sec hold - Standing VOR Cancellation  - 1 x daily - 7 x weekly - 3 sets - 10 reps    PATIENT EDUCATION: Education details: exam findings, HEP with edu for safety; edu on VOR loss B, answered pt's question on prognosis of VOR and balance  Person educated: Patient Education method: Explanation, Demonstration, Tactile cues, Verbal cues, and Handouts Education comprehension: verbalized understanding and returned demonstration    Note: Objective measures were completed at Evaluation unless otherwise noted.  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   SYMPTOM BEHAVIOR:  Subjective history: See above-wife provided most of hx.  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  Since the CVA, he has had visual disturbances with standing and walking-more vertical motion of eyes and blurred vision.  Non-Vestibular symptoms: tinnitus  Type of dizziness: Imbalance (Disequilibrium),  Oscillopsia, and Unsteady with head/body turns  Frequency: throughout the day   Duration: more as he gets tired  Aggravating factors: Induced by motion: looking up at the ceiling, turning body quickly, and turning head quickly and Worse with fatigue  Relieving factors: slow movements  Progression of symptoms: better  OCULOMOTOR EXAM:  Ocular Alignment: normal  Ocular ROM: No Limitations  Spontaneous Nystagmus: absent  Gaze-Induced Nystagmus: right beating with right gaze and left beating with left gaze  Smooth Pursuits: intact  Saccades: intact  Convergence/Divergence: 2-3 cm    VESTIBULAR - OCULAR REFLEX:   Slow VOR: Positive Bilaterally horizontal and vertical; blurriness  VOR Cancellation: Comment: no symptoms horizontal; would incr symptoms vertical  Head-Impulse Test: HIT Right: slowed HIT Left: slowed      MOTION SENSITIVITY: *NOT TESTED AT EVAL*  Motion Sensitivity Quotient Intensity: 0 = none, 1 = Lightheaded, 2 = Mild, 3 = Moderate, 4 = Severe, 5 = Vomiting  Intensity  1. Sitting to supine   2. Supine to L side   3. Supine to R side   4. Supine to sitting   5. L Hallpike-Dix   6. Up from L    7. R Hallpike-Dix   8. Up from R    9. Sitting, head tipped to L knee   10. Head up from L knee   11. Sitting, head tipped to R knee   12. Head up from R knee   13. Sitting head turns x5   14.Sitting head nods x5   15. In stance, 180 turn to L    16. In stance, 180 turn to R      FUNCTIONAL GAIT: 10 meter walk test: 19.91 sec = 1.65 ft/sec   M-CTSIB  Condition 1: Firm Surface, EO 30 Sec, Normal Sway  Condition 2: Firm Surface, EC 30 Sec, Mild Sway  Condition 3: Foam Surface, EO 30 Sec, Mild Sway  Condition 4: Foam Surface, EC 13.44 Sec, Severe Sway  TREATMENT DATE: 12/10/2023    PATIENT EDUCATION: Education details: Eval results, POC;  continue current exercises that patient was doing at home for standing VOR Person educated: Patient and Spouse Education method: Explanation Education comprehension: verbalized understanding  HOME EXERCISE PROGRAM:  GOALS: Goals reviewed with patient? Yes  SHORT TERM GOALS: Target date: 01/10/2024  Pt will be independent with HEP for improved balance, gait. Baseline: Goal status: IN PROGRESS  2.  Pt will improve gait velocity to at least 1.8 ft/sec for improved gait efficiency and safety. Baseline: 1.65 ft/sec Goal status: IN PROGRESS  3.  Pt will perform Condition 4 on MCTSIB for 30 sec with mod sway for improved overall balance. Baseline: 13.34 sec Goal status: IN PROGRESS  LONG TERM GOALS: Target date: 02/07/2024  Pt will be independent with advanced HEP for improved balance, gait. Baseline:  Goal status: IN PROGRESS  2.  Pt will improve gait velocity to at least 2.3 ft/sec for improved gait efficiency and safety. Baseline: 1.65 ft/sec Goal status: IN PROGRESS  3.  Pt will improve MCTSIB Condition 4 to mild sway for 30 seconds for improved overall balance. Baseline: 13.34 sec Goal status: IN PROGRESS  4.  FGA to be assessed and pt to improve to at least 22/30 for decreased fall risk. Baseline: 14/30 12/16/23 Goal status: IN PROGRESS 12/16/23  5.  Pt will ambulate at least 1000 ft, outdoor surfaces, least restrictive device, mod I, for return to walking [place] and [place] activities. Baseline:  Goal status: IN PROGRESS  6.  DHI score to improve to 10 or less for decreased dizziness impacting function. Baseline: 28  Goal status: IN PROGRESS  ASSESSMENT:  CLINICAL IMPRESSION: Review of gaze stabilization exercises with improved performance/sequence with coaching, cues, and use of metronome with ability to achieve 80 bpm before slip/error occurs.  Advanced balance activities to include multisensory experience with initial significant retro-LOB experienced with good  correction thereafter demo moderate sway under multisensory conditions. Difficulty with ambulation and narrow BOS and unable to come to midline during tandem walk. Continude sessions to advance balance, motor control, and reduce risk for falls.   OBJECTIVE IMPAIRMENTS: Abnormal gait, decreased balance, decreased mobility, difficulty walking, and impaired vision/preception.   ACTIVITY LIMITATIONS: reach over head, locomotion level, and caring for others  PARTICIPATION LIMITATIONS: meal prep, cleaning, laundry, driving, shopping, community activity, yard work, and hiking, horseback riding  PERSONAL FACTORS: 3+ comorbidities: see above PMH and recent medical hx are also affecting patient's functional outcome.   REHAB POTENTIAL: Good  CLINICAL DECISION MAKING: Evolving/moderate complexity  EVALUATION COMPLEXITY: Moderate   PLAN:  PT FREQUENCY: 2x/week  PT DURATION: 8 weeks plus eval  PLANNED INTERVENTIONS: 97750- Physical Performance Testing, 97110-Therapeutic exercises, 97530- Therapeutic activity, [id number]- Neuromuscular re-education, 97535- Self Care, [id number]- Manual therapy, [phone]- Gait training, Patient/Family education, Balance training, Stair training, and Vestibular training  PLAN FOR NEXT SESSION: review HEP for balance, VOR, VOR cancellation, multi-sensory balance exercise   5:05 PM, 12/24/23 M. Kelly Kashayla Ungerer, PT, DPT Physical Therapist- [place] Office Number: [phone]

## 2023-12-25 DIAGNOSIS — M898X0 Other specified disorders of bone, multiple sites: Secondary | ICD-10-CM | POA: Diagnosis not present

## 2023-12-25 DIAGNOSIS — H903 Sensorineural hearing loss, bilateral: Secondary | ICD-10-CM | POA: Diagnosis not present

## 2023-12-25 NOTE — Therapy (Signed)
 OUTPATIENT PHYSICAL THERAPY VESTIBULAR TREATMENT     Patient Name: Edward Marquez MRN: [id number] DOB:June 12, 1951, 73 y.o., male Today's Date: 12/26/2023  END OF SESSION:  PT End of Session - 12/26/23 1358     Visit Number 4    Number of Visits 17    Date for PT Re-Evaluation 02/07/24    Authorization Type [employer] Medicare    Authorization Time Period approved 17 PT visits from 12/10/2023-02/04/2024.    Progress Note Due on Visit 10    PT Start Time 1317    PT Stop Time 1359    PT Time Calculation (min) 42 min    Equipment Utilized During Treatment Gait belt   gait belt needed due to imbalance   Activity Tolerance Patient tolerated treatment well    Behavior During Therapy WFL for tasks assessed/performed               Past Medical History:  Diagnosis Date   Aortic valve defect    Atrial fibrillation (HCC)    CHF (congestive heart failure) (HCC)    Clotting disorder (HCC)    Gout    Hypertension    Peripheral vascular disease (HCC)    Past Surgical History:  Procedure Laterality Date   ABDOMINAL AORTOGRAM W/LOWER EXTREMITY N/A 05/03/2017   Procedure: ABDOMINAL AORTOGRAM W/LOWER EXTREMITY;  Surgeon: Dannis Dy, MD;  Location: [facility] INVASIVE CV LAB;  Service: Cardiovascular;  Laterality: N/A;   AORTIC VALVE REPLACEMENT     BYPASS GRAFT POPLITEAL TO TIBIAL Left 05/03/2017   Procedure: LEFT POPLITEAL TO ANTERIOR TIBIAL ARTERY BYPASS WITH VEIN;  Surgeon: Dannis Dy, MD;  Location: [facility] OR;  Service: Vascular;  Laterality: Left;   LOWER EXTREMITY ANGIOGRAPHY N/A 06/24/2017   Procedure: LOWER EXTREMITY ANGIOGRAPHY - Right;  Surgeon: Dannis Dy, MD;  Location: [facility] INVASIVE CV LAB;  Service: Cardiovascular;  Laterality: N/A;   PACEMAKER INSERTION     PERIPHERAL VASCULAR BALLOON ANGIOPLASTY  05/03/2017   Procedure: PERIPHERAL VASCULAR BALLOON ANGIOPLASTY;  Surgeon: Dannis Dy, MD;  Location: [facility] INVASIVE CV LAB;  Service: Cardiovascular;;    PERIPHERAL VASCULAR BALLOON ANGIOPLASTY Right 06/24/2017   Procedure: PERIPHERAL VASCULAR BALLOON ANGIOPLASTY;  Surgeon: Dannis Dy, MD;  Location: [facility] INVASIVE CV LAB;  Service: Cardiovascular;  Laterality: Right;  ant-tibial / perneal   VEIN HARVEST Left 05/03/2017   Procedure: POPITEAL VEIN HARVEST;  Surgeon: Dannis Dy, MD;  Location: [facility] OR;  Service: Vascular;  Laterality: Left;   Patient Active Problem List   Diagnosis Date Noted   Acute endocarditis 09/02/2023   Bacterial endocarditis 09/02/2023   Streptococcal bacteremia 09/02/2023   Positive blood culture 09/01/2023   Stage 3a chronic kidney disease (CKD) (HCC) 09/01/2023   CAP (community acquired pneumonia) 08/31/2023   Positive colorectal cancer screening using Cologuard test 08/29/2022   Hyperlipidemia LDL goal <70 07/31/2022   Hammer toes, bilateral 07/31/2022   Effusion, left knee 03/14/2022   Atrial fibrillation (HCC) 08/14/2017   History of implantable cardioverter-defibrillator (ICD) placement 08/14/2017   PAD (peripheral artery disease) (HCC) 05/03/2017   PVD (peripheral vascular disease) (HCC) 04/30/2017    PCP: Daved Eriksson, MD  REFERRING PROVIDER: Daved Eriksson, MD   REFERRING DIAG:  [phone] (ICD-10-CM) - Personal history of transient ischemic attack (TIA), and cerebral infarction without residual deficits  R42 (ICD-10-CM) - Dizziness and giddiness    THERAPY DIAG:  Unsteadiness on feet  Other abnormalities of gait and mobility  ONSET DATE: 12/05/2023  Rationale for Evaluation  and Treatment: Rehabilitation  SUBJECTIVE:   SUBJECTIVE STATEMENT: Doing good. I can feel improvement. Reports slight improvement in visual blurring and balance.    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   PMH includesAAA, A-fib, Cardiomyopathy, CHF, hx of aortic valve replacement, HTN, HLD, ICD, PVD, CVA, tinnitus  PAIN:  Are you having pain? No  PRECAUTIONS: Fall and Other: Avoid lifting 20#    Has ICD; pt did have sternal precautions, but he reports they have been lifted. RED FLAGS: None   WEIGHT BEARING RESTRICTIONS: No  FALLS: Has patient fallen in last 6 months? Yes. Number of falls 1  LIVING ENVIRONMENT: Lives with: lives with their spouse Lives in: House/apartment Stairs: 5 steps to enter front; 12 steps into second floor (bedroom); rails Has following equipment at home: Single point cane and Walker - 2 wheeled  PLOF: Independent  PATIENT GOALS: To get back to normal, prior level of activities  OBJECTIVE:     TODAY'S TREATMENT: 12/26/23 Activity Comments  standing head turns/nods to targets  Used targets within small ROM for better success  standing eyes, then head movement to targets  Report of more difficulty getting clarity towards R side than L   romberg EC + head turns/nods 2x30" Cueing for larger and quicker head movements   1/2 turns at counter to targets In II bars; c/o visual blurring   marching on foam  Weaned to 2 fingertip support; mild instability      PATIENT EDUCATION: Education details: edu on differences between gaze stabilization vs. Compensation/substitution and importance of each; provided edu on airex pad for HEP Person educated: Patient Education method: Explanation, Demonstration, Tactile cues, Verbal cues, and Handouts Education comprehension: verbalized understanding and returned demonstration     HOME EXERCISE PROGRAM Access Code: [email or web address] URL: [email or web address] Date: 12/26/2023 Prepared by: [facility] - Outpatient  Rehab - [facility]  Program Notes perform in a corner or at counter top for safety  Exercises - Standing Gaze Stabilization with Head Rotation  - 1 x daily - 5 x weekly - 2-3 sets - 30 sec  hold - Standing Gaze Stabilization with Head Nod  - 1 x daily - 5 x weekly - 2-3 sets - 30 sec hold - Romberg Stance with Eyes Closed  - 1 x daily - 5 x weekly - 2-3 sets - 30 sec hold - Corner Balance Feet Together: Eyes Closed With Head Turns  - 1 x daily - 5 x weekly - 2-3 sets - 30 sec hold - Romberg Stance with Head Nods  - 1 x daily - 5 x weekly - 2-3 sets - 30 sec hold - Standing VOR Cancellation  - 1 x daily - 7 x weekly - 3 sets - 10 reps - Standing March with Counter Support  - 1 x daily - 5 x weekly - 2 sets - 20 reps     Note: Objective measures were completed at Evaluation unless otherwise noted.  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   SYMPTOM BEHAVIOR:  Subjective history: See above-wife provided most of hx.  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  Since the CVA, he has had visual disturbances with standing and walking-more vertical motion of eyes and blurred vision.  Non-Vestibular symptoms: tinnitus  Type of dizziness: Imbalance (Disequilibrium), Oscillopsia, and Unsteady with head/body turns  Frequency: throughout the day   Duration: more as he gets tired  Aggravating factors: Induced by motion: looking up at the ceiling, turning body  quickly, and turning head quickly and Worse with fatigue  Relieving factors: slow movements  Progression of symptoms: better  OCULOMOTOR EXAM:  Ocular Alignment: normal  Ocular ROM: No Limitations  Spontaneous Nystagmus: absent  Gaze-Induced Nystagmus: right beating with right gaze and left beating with left gaze  Smooth Pursuits: intact  Saccades: intact  Convergence/Divergence: 2-3 cm    VESTIBULAR - OCULAR REFLEX:   Slow VOR: Positive Bilaterally horizontal and vertical; blurriness  VOR Cancellation: Comment: no symptoms horizontal; would incr symptoms vertical  Head-Impulse Test: HIT Right: slowed HIT Left: slowed      MOTION SENSITIVITY: *NOT TESTED AT EVAL*  Motion Sensitivity Quotient Intensity: 0 = none, 1 = Lightheaded, 2 = Mild, 3 = Moderate, 4 = Severe, 5 = Vomiting  Intensity  1. Sitting to supine   2. Supine to L side   3. Supine to R side   4. Supine to sitting   5. L Hallpike-Dix   6. Up from L    7. R Hallpike-Dix   8. Up from R    9. Sitting, head tipped to L knee   10. Head up from L knee   11. Sitting, head tipped to R knee   12. Head up from R knee   13. Sitting head turns x5   14.Sitting head nods x5   15. In stance, 180 turn to L    16. In stance, 180 turn to R      FUNCTIONAL GAIT: 10 meter walk test: 19.91 sec = 1.65 ft/sec   M-CTSIB  Condition 1: Firm Surface, EO 30 Sec, Normal Sway  Condition 2: Firm Surface, EC 30 Sec, Mild Sway  Condition 3: Foam Surface, EO 30 Sec, Mild Sway  Condition 4: Foam Surface, EC 13.44 Sec, Severe Sway                                                                                                                              TREATMENT DATE: 12/10/2023    PATIENT EDUCATION: Education details: Eval results, POC; continue current exercises that patient was doing at home for standing VOR Person educated:  Patient and Spouse Education method: Explanation Education comprehension: verbalized  understanding  HOME EXERCISE PROGRAM:  GOALS: Goals reviewed with patient? Yes  SHORT TERM GOALS: Target date: 01/10/2024  Pt will be independent with HEP for improved balance, gait. Baseline: Goal status: IN PROGRESS  2.  Pt will improve gait velocity to at least 1.8 ft/sec for improved gait efficiency and safety. Baseline: 1.65 ft/sec Goal status: IN PROGRESS  3.  Pt will perform Condition 4 on MCTSIB for 30 sec with mod sway for improved overall balance. Baseline: 13.34 sec Goal status: IN PROGRESS  LONG TERM GOALS: Target date: 02/07/2024  Pt will be independent with advanced HEP for improved balance, gait. Baseline:  Goal status: IN PROGRESS  2.  Pt will improve gait velocity to at least 2.3 ft/sec for improved gait efficiency and safety. Baseline: 1.65 ft/sec Goal status: IN PROGRESS  3.  Pt will improve MCTSIB Condition 4 to mild sway for 30 seconds for improved overall balance. Baseline: 13.34 sec Goal status: IN PROGRESS  4.  FGA to be assessed and pt to improve to at least 22/30 for decreased fall risk. Baseline: 14/30 12/16/23 Goal status: IN PROGRESS 12/16/23  5.  Pt will ambulate at least 1000 ft, outdoor surfaces, least restrictive device, mod I, for return to walking [place] and [place] activities. Baseline:  Goal status: IN PROGRESS  6.  DHI score to improve to 10 or less for decreased dizziness impacting function. Baseline: 28  Goal status: IN PROGRESS  ASSESSMENT:  CLINICAL IMPRESSION: Patient arrived to session with report of improvement in visual blurring and balance. Continued VOR training and use of substitution for B vestibular loss. Balance activities incorporated compliant surface, head turns, and EC. HEP was updated to include compliant surface training as this was performed safely with UE support today. No complaints at end of session.   OBJECTIVE IMPAIRMENTS: Abnormal gait, decreased balance, decreased mobility, difficulty walking, and  impaired vision/preception.   ACTIVITY LIMITATIONS: reach over head, locomotion level, and caring for others  PARTICIPATION LIMITATIONS: meal prep, cleaning, laundry, driving, shopping, community activity, yard work, and hiking, horseback riding  PERSONAL FACTORS: 3+ comorbidities: see above PMH and recent medical hx are also affecting patient's functional outcome.   REHAB POTENTIAL: Good  CLINICAL DECISION MAKING: Evolving/moderate complexity  EVALUATION COMPLEXITY: Moderate   PLAN:  PT FREQUENCY: 2x/week  PT DURATION: 8 weeks plus eval  PLANNED INTERVENTIONS: 97750- Physical Performance Testing, 97110-Therapeutic exercises, 97530- Therapeutic activity, 97112- Neuromuscular re-education, 97535- Self Care, [id number]- Manual therapy, [phone]- Gait training, Patient/Family education, Balance training, Stair training, and Vestibular training  PLAN FOR NEXT SESSION: review HEP for balance, VOR, VOR cancellation, multi-sensory balance exercise   Thaddeus Filippo, PT, DPT 12/26/23 2:01 PM  [facility] [address] [place], [place] [address] Phone # [phone] Fax # [phone]

## 2023-12-26 ENCOUNTER — Encounter: Payer: Self-pay | Admitting: Physical Therapy

## 2023-12-26 ENCOUNTER — Ambulatory Visit: Admitting: Physical Therapy

## 2023-12-26 DIAGNOSIS — R2689 Other abnormalities of gait and mobility: Secondary | ICD-10-CM

## 2023-12-26 DIAGNOSIS — R42 Dizziness and giddiness: Secondary | ICD-10-CM | POA: Diagnosis not present

## 2023-12-26 DIAGNOSIS — R2681 Unsteadiness on feet: Secondary | ICD-10-CM

## 2023-12-30 DIAGNOSIS — M79671 Pain in right foot: Secondary | ICD-10-CM | POA: Diagnosis not present

## 2023-12-30 DIAGNOSIS — I7389 Other specified peripheral vascular diseases: Secondary | ICD-10-CM | POA: Diagnosis not present

## 2023-12-30 DIAGNOSIS — M79672 Pain in left foot: Secondary | ICD-10-CM | POA: Diagnosis not present

## 2023-12-30 DIAGNOSIS — L84 Corns and callosities: Secondary | ICD-10-CM | POA: Diagnosis not present

## 2023-12-30 NOTE — Therapy (Signed)
 OUTPATIENT PHYSICAL THERAPY VESTIBULAR TREATMENT     Patient Name: Edward Marquez MRN: [id number] DOB:1950-09-08, 73 y.o., male Today's Date: 12/31/2023  END OF SESSION:  PT End of Session - 12/31/23 1658     Visit Number 5    Number of Visits 17    Date for PT Re-Evaluation 02/07/24    Authorization Type [employer] Medicare    Authorization Time Period approved 17 PT visits from 12/10/2023-02/04/2024.    Progress Note Due on Visit 10    PT Start Time 1617    PT Stop Time 1658    PT Time Calculation (min) 41 min    Equipment Utilized During Treatment Gait belt   gait belt needed due to imbalance   Activity Tolerance Patient tolerated treatment well    Behavior During Therapy WFL for tasks assessed/performed                Past Medical History:  Diagnosis Date   Aortic valve defect    Atrial fibrillation (HCC)    CHF (congestive heart failure) (HCC)    Clotting disorder (HCC)    Gout    Hypertension    Peripheral vascular disease (HCC)    Past Surgical History:  Procedure Laterality Date   ABDOMINAL AORTOGRAM W/LOWER EXTREMITY N/A 05/03/2017   Procedure: ABDOMINAL AORTOGRAM W/LOWER EXTREMITY;  Surgeon: Dannis Dy, MD;  Location: [facility] INVASIVE CV LAB;  Service: Cardiovascular;  Laterality: N/A;   AORTIC VALVE REPLACEMENT     BYPASS GRAFT POPLITEAL TO TIBIAL Left 05/03/2017   Procedure: LEFT POPLITEAL TO ANTERIOR TIBIAL ARTERY BYPASS WITH VEIN;  Surgeon: Dannis Dy, MD;  Location: [facility] OR;  Service: Vascular;  Laterality: Left;   LOWER EXTREMITY ANGIOGRAPHY N/A 06/24/2017   Procedure: LOWER EXTREMITY ANGIOGRAPHY - Right;  Surgeon: Dannis Dy, MD;  Location: [facility] INVASIVE CV LAB;  Service: Cardiovascular;  Laterality: N/A;   PACEMAKER INSERTION     PERIPHERAL VASCULAR BALLOON ANGIOPLASTY  05/03/2017   Procedure: PERIPHERAL VASCULAR BALLOON ANGIOPLASTY;  Surgeon: Dannis Dy, MD;  Location: [facility] INVASIVE CV LAB;  Service: Cardiovascular;;    PERIPHERAL VASCULAR BALLOON ANGIOPLASTY Right 06/24/2017   Procedure: PERIPHERAL VASCULAR BALLOON ANGIOPLASTY;  Surgeon: Dannis Dy, MD;  Location: [facility] INVASIVE CV LAB;  Service: Cardiovascular;  Laterality: Right;  ant-tibial / perneal   VEIN HARVEST Left 05/03/2017   Procedure: POPITEAL VEIN HARVEST;  Surgeon: Dannis Dy, MD;  Location: [facility] OR;  Service: Vascular;  Laterality: Left;   Patient Active Problem List   Diagnosis Date Noted   Acute endocarditis 09/02/2023   Bacterial endocarditis 09/02/2023   Streptococcal bacteremia 09/02/2023   Positive blood culture 09/01/2023   Stage 3a chronic kidney disease (CKD) (HCC) 09/01/2023   CAP (community acquired pneumonia) 08/31/2023   Positive colorectal cancer screening using Cologuard test 08/29/2022   Hyperlipidemia LDL goal <70 07/31/2022   Hammer toes, bilateral 07/31/2022   Effusion, left knee 03/14/2022   Atrial fibrillation (HCC) 08/14/2017   History of implantable cardioverter-defibrillator (ICD) placement 08/14/2017   PAD (peripheral artery disease) (HCC) 05/03/2017   PVD (peripheral vascular disease) (HCC) 04/30/2017    PCP: Daved Eriksson, MD  REFERRING PROVIDER: Daved Eriksson, MD   REFERRING DIAG:  [phone] (ICD-10-CM) - Personal history of transient ischemic attack (TIA), and cerebral infarction without residual deficits  R42 (ICD-10-CM) - Dizziness and giddiness    THERAPY DIAG:  Unsteadiness on feet  Other abnormalities of gait and mobility  Dizziness and giddiness  ONSET DATE:  12/05/2023  Rationale for Evaluation and Treatment: Rehabilitation  SUBJECTIVE:   SUBJECTIVE STATEMENT: [place]. Was able to mow and ride the tractor and vision feels more stable. Drove the truck for the first time which is a stiff suspension- got used to the drive as time went on.    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  PMH includesAAA, A-fib, Cardiomyopathy, CHF, hx of aortic valve replacement, HTN, HLD, ICD, PVD, CVA, tinnitus  PAIN:  Are you having pain? No  PRECAUTIONS: Fall and Other: Avoid lifting 20#    Has ICD; pt did have sternal precautions, but he reports they have been lifted. RED FLAGS: None   WEIGHT BEARING RESTRICTIONS: No  FALLS: Has patient fallen in last 6 months? Yes. Number of falls 1  LIVING ENVIRONMENT: Lives with: lives with their spouse Lives in: House/apartment Stairs: 5 steps to enter front; 12 steps into second floor (bedroom); rails Has following equipment at home: Single point cane and Walker - 2 wheeled  PLOF: Independent  PATIENT GOALS: To get back to normal, prior level of activities  OBJECTIVE:     TODAY'S TREATMENT: 12/31/23 Activity Comments  marching on foam 3x30" Weaned to 1 fingertip support   standing EC on foam 3x30"  LOB requiring min-mod A ro recover, adjusted to feet apart which was a more appropriate challenge   Romberg EC + head turns 30" Good stability   Standing VOR cancellation 30" Pursuit is smooth; no sx or imbalance   Gait without AD: Straight ahead gait Bending to pick up cones Stepping over obstacles  Stair navigation  Good stability with all activities except stairs. Pt with wide BOS and with near-fall descending   Gait outside on sidewalk, ramp, curb, grass [phone] Performed curbs with and without cane; gait performed without cane. Patient with a few balance checks and wide BOS with uneven step length     HOME EXERCISE PROGRAM Access Code: [email or web address] URL: [email or web address] Date: 12/31/2023 Prepared by: [facility] - Outpatient  Rehab - [facility]  Program Notes perform in a corner with a chair in front for safety  Exercises - Standing Gaze Stabilization with  Head Rotation  - 1 x daily - 5 x weekly - 2-3 sets - 30 sec hold - Standing Gaze Stabilization with Head Nod  - 1 x daily - 5 x weekly - 2-3 sets - 30 sec hold - Standing March with Counter Support  - 1 x daily - 5 x weekly - 2 sets - 20 reps - Standing Balance with Eyes Closed on Foam  - 1 x daily - 5 x weekly - 2 sets - 30 sec  hold   PATIENT EDUCATION: Education details: HEP update and consolidation with edu for safety  Person educated: Patient Education method: Explanation, Demonstration, Tactile cues, Verbal cues, and Handouts Education comprehension: verbalized understanding and returned demonstration   Note: Objective measures were completed at Evaluation unless otherwise noted.  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   SYMPTOM BEHAVIOR:  Subjective history: See above-wife provided most of hx.  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  Since the CVA, he has had visual disturbances with standing and walking-more vertical motion of eyes and blurred vision.  Non-Vestibular symptoms: tinnitus  Type of dizziness: Imbalance (Disequilibrium), Oscillopsia, and Unsteady with head/body turns  Frequency: throughout the day   Duration: more as he gets  tired  Aggravating factors: Induced by motion: looking up at the ceiling, turning body quickly, and turning head quickly and Worse with fatigue  Relieving factors: slow movements  Progression of symptoms: better  OCULOMOTOR EXAM:  Ocular Alignment: normal  Ocular ROM: No Limitations  Spontaneous Nystagmus: absent  Gaze-Induced Nystagmus: right beating with right gaze and left beating with left gaze  Smooth Pursuits: intact  Saccades: intact  Convergence/Divergence: 2-3 cm    VESTIBULAR - OCULAR REFLEX:   Slow VOR: Positive Bilaterally horizontal and vertical; blurriness  VOR Cancellation: Comment: no symptoms horizontal; would incr symptoms vertical  Head-Impulse Test: HIT Right: slowed HIT Left: slowed      MOTION SENSITIVITY: *NOT TESTED AT EVAL*  Motion Sensitivity Quotient Intensity: 0 = none, 1 = Lightheaded, 2 = Mild, 3 = Moderate, 4 = Severe, 5 = Vomiting  Intensity  1. Sitting to supine   2. Supine to L side   3. Supine to R side   4. Supine to sitting   5. L Hallpike-Dix   6. Up from L    7. R Hallpike-Dix   8. Up from R    9. Sitting, head tipped to L knee   10. Head up from L knee   11. Sitting, head tipped to R knee   12. Head up from R knee   13. Sitting head turns x5   14.Sitting head nods x5   15. In stance, 180 turn to L    16. In stance, 180 turn to R      FUNCTIONAL GAIT: 10 meter walk test: 19.91 sec = 1.65 ft/sec   M-CTSIB  Condition 1: Firm Surface, EO 30 Sec, Normal Sway  Condition 2: Firm Surface, EC 30 Sec, Mild Sway  Condition 3: Foam Surface, EO 30 Sec, Mild Sway  Condition 4: Foam Surface, EC 13.44 Sec, Severe Sway                                                                                                                              TREATMENT DATE: 12/10/2023    PATIENT EDUCATION: Education details: Eval results, POC; continue current exercises that patient was doing at home for standing VOR  Person educated: Patient and  Spouse Education method: Explanation Education comprehension: verbalized understanding  HOME EXERCISE PROGRAM:  GOALS: Goals reviewed with patient? Yes  SHORT TERM GOALS: Target date: 01/10/2024  Pt will be independent with HEP for improved balance, gait. Baseline: Goal status: IN PROGRESS  2.  Pt will improve gait velocity to at least 1.8 ft/sec for improved gait efficiency and safety. Baseline: 1.65 ft/sec Goal status: IN PROGRESS  3.  Pt will perform Condition 4 on MCTSIB for 30 sec with mod sway for improved overall balance. Baseline: 13.34 sec Goal status: IN PROGRESS  LONG TERM GOALS: Target date: 02/07/2024  Pt will be independent with advanced HEP for improved balance, gait. Baseline:  Goal status: IN PROGRESS  2.  Pt will improve gait velocity to at least 2.3 ft/sec for improved gait efficiency and safety. Baseline: 1.65 ft/sec Goal status: IN PROGRESS  3.  Pt will improve MCTSIB Condition 4 to mild sway for 30 seconds for improved overall balance. Baseline: 13.34 sec Goal status: IN PROGRESS  4.  FGA to be assessed and pt to improve to at least 22/30 for decreased fall risk. Baseline: 14/30 12/16/23 Goal status: IN PROGRESS 12/16/23  5.  Pt will ambulate at least 1000 ft, outdoor surfaces, least restrictive device, mod I, for return to walking his [place] and hiking activities. Baseline:  Goal status: IN PROGRESS  6.  DHI score to improve to 10 or less for decreased dizziness impacting function. Baseline: 28  Goal status: IN PROGRESS  ASSESSMENT:  CLINICAL IMPRESSION: Patient arrived to session without complaints. Continued working on Biomedical engineer today. Patient unable to maintain compliance surface/EC stability on his own, as expected for B vestibular loss. Modifications provided for max success. HEP was updated as pt's balance and gaze stability is improved. Observed LOB when descending stairs, however pt is able to negotiate curbs with  fairly good stability. Thus, may need increased practice with stair negotiation. No complaints at end of session.   OBJECTIVE IMPAIRMENTS: Abnormal gait, decreased balance, decreased mobility, difficulty walking, and impaired vision/preception.   ACTIVITY LIMITATIONS: reach over head, locomotion level, and caring for others  PARTICIPATION LIMITATIONS: meal prep, cleaning, laundry, driving, shopping, community activity, yard work, and hiking, horseback riding  PERSONAL FACTORS: 3+ comorbidities: see above PMH and recent medical hx are also affecting patient's functional outcome.   REHAB POTENTIAL: Good  CLINICAL DECISION MAKING: Evolving/moderate complexity  EVALUATION COMPLEXITY: Moderate   PLAN:  PT FREQUENCY: 2x/week  PT DURATION: 8 weeks plus eval  PLANNED INTERVENTIONS: 97750- Physical Performance Testing, 97110-Therapeutic exercises, 97530- Therapeutic activity, 97112- Neuromuscular re-education, 97535- Self Care, 56213- Manual therapy, [phone]- Gait training, Patient/Family education, Balance training, Stair training, and Vestibular training  PLAN FOR NEXT SESSION: work towards stair negotiation without handrail; VOR,compliant surface balance   Thaddeus Filippo, PT, DPT 12/31/23 5:03 PM   Outpatient Rehab at [facility] [address] [place], [place] [address] Phone # [phone] Fax # [phone]

## 2023-12-31 ENCOUNTER — Ambulatory Visit: Admitting: Physical Therapy

## 2023-12-31 ENCOUNTER — Encounter: Payer: Self-pay | Admitting: Physical Therapy

## 2023-12-31 DIAGNOSIS — I4819 Other persistent atrial fibrillation: Secondary | ICD-10-CM | POA: Diagnosis not present

## 2023-12-31 DIAGNOSIS — R2689 Other abnormalities of gait and mobility: Secondary | ICD-10-CM | POA: Diagnosis not present

## 2023-12-31 DIAGNOSIS — Z8673 Personal history of transient ischemic attack (TIA), and cerebral infarction without residual deficits: Secondary | ICD-10-CM | POA: Diagnosis not present

## 2023-12-31 DIAGNOSIS — R42 Dizziness and giddiness: Secondary | ICD-10-CM | POA: Diagnosis not present

## 2023-12-31 DIAGNOSIS — I739 Peripheral vascular disease, unspecified: Secondary | ICD-10-CM | POA: Diagnosis not present

## 2023-12-31 DIAGNOSIS — I110 Hypertensive heart disease with heart failure: Secondary | ICD-10-CM | POA: Diagnosis not present

## 2023-12-31 DIAGNOSIS — I420 Dilated cardiomyopathy: Secondary | ICD-10-CM | POA: Diagnosis not present

## 2023-12-31 DIAGNOSIS — R2681 Unsteadiness on feet: Secondary | ICD-10-CM

## 2023-12-31 DIAGNOSIS — I5022 Chronic systolic (congestive) heart failure: Secondary | ICD-10-CM | POA: Diagnosis not present

## 2023-12-31 DIAGNOSIS — Z7689 Persons encountering health services in other specified circumstances: Secondary | ICD-10-CM | POA: Diagnosis not present

## 2023-12-31 DIAGNOSIS — E785 Hyperlipidemia, unspecified: Secondary | ICD-10-CM | POA: Diagnosis not present

## 2023-12-31 DIAGNOSIS — Z952 Presence of prosthetic heart valve: Secondary | ICD-10-CM | POA: Diagnosis not present

## 2023-12-31 DIAGNOSIS — I714 Abdominal aortic aneurysm, without rupture, unspecified: Secondary | ICD-10-CM | POA: Diagnosis not present

## 2023-12-31 DIAGNOSIS — Z8679 Personal history of other diseases of the circulatory system: Secondary | ICD-10-CM | POA: Diagnosis not present

## 2023-12-31 DIAGNOSIS — Z95810 Presence of automatic (implantable) cardiac defibrillator: Secondary | ICD-10-CM | POA: Diagnosis not present

## 2024-01-01 DIAGNOSIS — Z95810 Presence of automatic (implantable) cardiac defibrillator: Secondary | ICD-10-CM | POA: Diagnosis not present

## 2024-01-01 DIAGNOSIS — Z8679 Personal history of other diseases of the circulatory system: Secondary | ICD-10-CM | POA: Diagnosis not present

## 2024-01-01 DIAGNOSIS — N1831 Chronic kidney disease, stage 3a: Secondary | ICD-10-CM | POA: Diagnosis not present

## 2024-01-01 DIAGNOSIS — I76 Septic arterial embolism: Secondary | ICD-10-CM | POA: Diagnosis not present

## 2024-01-01 DIAGNOSIS — I420 Dilated cardiomyopathy: Secondary | ICD-10-CM | POA: Diagnosis not present

## 2024-01-02 ENCOUNTER — Ambulatory Visit

## 2024-01-02 DIAGNOSIS — R2689 Other abnormalities of gait and mobility: Secondary | ICD-10-CM

## 2024-01-02 DIAGNOSIS — R2681 Unsteadiness on feet: Secondary | ICD-10-CM | POA: Diagnosis not present

## 2024-01-02 DIAGNOSIS — R42 Dizziness and giddiness: Secondary | ICD-10-CM

## 2024-01-02 NOTE — Therapy (Signed)
 OUTPATIENT PHYSICAL THERAPY VESTIBULAR TREATMENT     Patient Name: Edward Marquez MRN: [id number] DOB:Jun 19, 1951, 73 y.o., male Today's Date: 01/02/2024  END OF SESSION:  PT End of Session - 01/02/24 1617     Visit Number 6    Number of Visits 17    Date for PT Re-Evaluation 02/07/24    Authorization Type [employer] Medicare    Authorization Time Period approved 17 PT visits from 12/10/2023-02/04/2024.    Progress Note Due on Visit 10    PT Start Time 1615    PT Stop Time 1700    PT Time Calculation (min) 45 min    Equipment Utilized During Treatment Gait belt   gait belt needed due to imbalance   Activity Tolerance Patient tolerated treatment well    Behavior During Therapy WFL for tasks assessed/performed                Past Medical History:  Diagnosis Date   Aortic valve defect    Atrial fibrillation (HCC)    CHF (congestive heart failure) (HCC)    Clotting disorder (HCC)    Gout    Hypertension    Peripheral vascular disease (HCC)    Past Surgical History:  Procedure Laterality Date   ABDOMINAL AORTOGRAM W/LOWER EXTREMITY N/A 05/03/2017   Procedure: ABDOMINAL AORTOGRAM W/LOWER EXTREMITY;  Surgeon: Dannis Dy, MD;  Location: [facility] INVASIVE CV LAB;  Service: Cardiovascular;  Laterality: N/A;   AORTIC VALVE REPLACEMENT     BYPASS GRAFT POPLITEAL TO TIBIAL Left 05/03/2017   Procedure: LEFT POPLITEAL TO ANTERIOR TIBIAL ARTERY BYPASS WITH VEIN;  Surgeon: Dannis Dy, MD;  Location: [facility] OR;  Service: Vascular;  Laterality: Left;   LOWER EXTREMITY ANGIOGRAPHY N/A 06/24/2017   Procedure: LOWER EXTREMITY ANGIOGRAPHY - Right;  Surgeon: Dannis Dy, MD;  Location: [facility] INVASIVE CV LAB;  Service: Cardiovascular;  Laterality: N/A;   PACEMAKER INSERTION     PERIPHERAL VASCULAR BALLOON ANGIOPLASTY  05/03/2017   Procedure: PERIPHERAL VASCULAR BALLOON ANGIOPLASTY;  Surgeon: Dannis Dy, MD;  Location: [facility] INVASIVE CV LAB;  Service: Cardiovascular;;    PERIPHERAL VASCULAR BALLOON ANGIOPLASTY Right 06/24/2017   Procedure: PERIPHERAL VASCULAR BALLOON ANGIOPLASTY;  Surgeon: Dannis Dy, MD;  Location: [facility] INVASIVE CV LAB;  Service: Cardiovascular;  Laterality: Right;  ant-tibial / perneal   VEIN HARVEST Left 05/03/2017   Procedure: POPITEAL VEIN HARVEST;  Surgeon: Dannis Dy, MD;  Location: [facility] OR;  Service: Vascular;  Laterality: Left;   Patient Active Problem List   Diagnosis Date Noted   Acute endocarditis 09/02/2023   Bacterial endocarditis 09/02/2023   Streptococcal bacteremia 09/02/2023   Positive blood culture 09/01/2023   Stage 3a chronic kidney disease (CKD) (HCC) 09/01/2023   CAP (community acquired pneumonia) 08/31/2023   Positive colorectal cancer screening using Cologuard test 08/29/2022   Hyperlipidemia LDL goal <70 07/31/2022   Hammer toes, bilateral 07/31/2022   Effusion, left knee 03/14/2022   Atrial fibrillation (HCC) 08/14/2017   History of implantable cardioverter-defibrillator (ICD) placement 08/14/2017   PAD (peripheral artery disease) (HCC) 05/03/2017   PVD (peripheral vascular disease) (HCC) 04/30/2017    PCP: Daved Eriksson, MD  REFERRING PROVIDER: Daved Eriksson, MD   REFERRING DIAG:  [phone] (ICD-10-CM) - Personal history of transient ischemic attack (TIA), and cerebral infarction without residual deficits  R42 (ICD-10-CM) - Dizziness and giddiness    THERAPY DIAG:  Unsteadiness on feet  Other abnormalities of gait and mobility  Dizziness and giddiness  ONSET DATE:  12/05/2023  Rationale for Evaluation and Treatment: Rehabilitation  SUBJECTIVE:   SUBJECTIVE STATEMENT: Doing well, been busy working at property. 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  PMH includesAAA, A-fib, Cardiomyopathy, CHF, hx of aortic valve replacement, HTN, HLD, ICD, PVD, CVA, tinnitus  PAIN:  Are you having pain? No  PRECAUTIONS: Fall and Other: Avoid lifting 20#    Has ICD; pt did have sternal precautions, but he reports they have been lifted. RED FLAGS: None   WEIGHT BEARING RESTRICTIONS: No  FALLS: Has patient fallen in last 6 months? Yes. Number of falls 1  LIVING ENVIRONMENT: Lives with: lives with their spouse Lives in: House/apartment Stairs: 5 steps to enter front; 12 steps into second floor (bedroom); rails Has following equipment at home: Single point cane and Walker - 2 wheeled  PLOF: Independent  PATIENT GOALS: To get back to normal, prior level of activities  OBJECTIVE:   TODAY'S TREATMENT: 01/02/24 Activity Comments  Sled push/pull 6x45 ft 45#  Stair ambulation -trials w/out HR and SBA-CGA -trials unilat 10# carry  Walking VOR x 1   Static visual acuity line 4 Dynamic Visual acuity line 2-3   Seated on physioball bouncing -static gaze -VOR  Multisensory balance   Righting reactions 1-2 steps required       TODAY'S TREATMENT: 12/31/23 Activity Comments  marching on foam 3x30" Weaned to 1 fingertip support   standing EC on foam 3x30"  LOB requiring min-mod A ro recover, adjusted to feet apart which was a more appropriate challenge   Romberg EC + head turns 30" Good stability   Standing VOR cancellation 30" Pursuit is smooth; no sx or imbalance   Gait without AD: Straight ahead gait Bending to pick up cones Stepping over obstacles  Stair navigation  Good stability with all activities except stairs. Pt with wide BOS and with near-fall descending   Gait outside on sidewalk, ramp, curb, grass x530ft Performed curbs with and without cane; gait performed without cane. Patient with a few balance checks and wide BOS with uneven step length     HOME EXERCISE PROGRAM Access Code: [email or web address] URL:  [email or web address] Date: 12/31/2023 Prepared by: [facility] - Outpatient  Rehab - [facility]  Program Notes perform in a corner with a chair in front for safety  Exercises - Standing Gaze Stabilization with Head Rotation  - 1 x daily - 5 x weekly - 2-3 sets - 30 sec hold - Standing Gaze Stabilization with Head Nod  - 1 x daily - 5 x weekly - 2-3 sets - 30 sec hold - Standing March with Counter Support  - 1 x daily - 5 x weekly - 2 sets - 20 reps - Standing Balance with Eyes Closed on Foam  - 1 x daily - 5 x weekly - 2 sets - 30 sec  hold   PATIENT EDUCATION: Education details: HEP update and consolidation with edu for safety  Person educated: Patient Education method: Explanation, Demonstration, Tactile cues, Verbal cues, and Handouts Education comprehension: verbalized understanding and returned demonstration   Note: Objective measures were completed at Evaluation unless otherwise noted.  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   SYMPTOM BEHAVIOR:  Subjective history: See above-wife provided most of hx.  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  Since the CVA, he has had visual disturbances with standing and walking-more vertical  motion of eyes and blurred vision.  Non-Vestibular symptoms: tinnitus  Type of dizziness: Imbalance (Disequilibrium), Oscillopsia, and Unsteady with head/body turns  Frequency: throughout the day   Duration: more as he gets tired  Aggravating factors: Induced by motion: looking up at the ceiling, turning body quickly, and turning head quickly and Worse with fatigue  Relieving factors: slow movements  Progression of symptoms: better  OCULOMOTOR EXAM:  Ocular Alignment: normal  Ocular ROM: No Limitations  Spontaneous Nystagmus: absent  Gaze-Induced Nystagmus: right beating with right gaze and left beating with left gaze  Smooth Pursuits: intact  Saccades: intact  Convergence/Divergence: 2-3 cm    VESTIBULAR - OCULAR REFLEX:   Slow VOR: Positive Bilaterally horizontal and vertical; blurriness  VOR Cancellation: Comment: no symptoms horizontal; would incr symptoms vertical  Head-Impulse Test: HIT Right: slowed HIT Left: slowed      MOTION SENSITIVITY: *NOT TESTED AT EVAL*  Motion Sensitivity Quotient Intensity: 0 = none, 1 = Lightheaded, 2 = Mild, 3 = Moderate, 4 = Severe, 5 = Vomiting  Intensity  1. Sitting to supine   2. Supine to L side   3. Supine to R side   4. Supine to sitting   5. L Hallpike-Dix   6. Up from L    7. R Hallpike-Dix   8. Up from R    9. Sitting, head tipped to L knee   10. Head up from L knee   11. Sitting, head tipped to R knee   12. Head up from R knee   13. Sitting head turns x5   14.Sitting head nods x5   15. In stance, 180 turn to L    16. In stance, 180 turn to R      FUNCTIONAL GAIT: 10 meter walk test: 19.91 sec = 1.65 ft/sec   M-CTSIB  Condition 1: Firm Surface, EO 30 Sec, Normal Sway  Condition 2: Firm Surface, EC 30 Sec, Mild Sway  Condition 3: Foam Surface, EO 30 Sec, Mild Sway  Condition 4: Foam Surface, EC 13.44 Sec, Severe Sway  TREATMENT DATE: 12/10/2023    PATIENT EDUCATION: Education details: Eval results, POC; continue current exercises that patient was doing at home for standing VOR Person educated: Patient and Spouse Education method: Explanation Education comprehension: verbalized understanding  HOME EXERCISE PROGRAM:  GOALS: Goals reviewed with patient? Yes  SHORT TERM GOALS: Target date: 01/10/2024  Pt will be independent with HEP for improved balance, gait. Baseline: Goal status: IN PROGRESS  2.  Pt will improve gait velocity to at least 1.8 ft/sec for improved gait efficiency and safety. Baseline: 1.65 ft/sec Goal status: IN PROGRESS  3.  Pt will perform Condition 4 on MCTSIB for 30 sec with mod sway for improved overall balance. Baseline: 13.34 sec Goal status: IN PROGRESS  LONG TERM GOALS: Target date: 02/07/2024  Pt will be independent with advanced HEP for improved balance, gait. Baseline:  Goal status: IN PROGRESS  2.  Pt will improve gait velocity to at least 2.3 ft/sec for improved gait efficiency and safety. Baseline: 1.65 ft/sec Goal status: IN PROGRESS  3.  Pt will improve MCTSIB Condition 4 to mild sway for 30 seconds for improved overall balance. Baseline: 13.34 sec Goal status: IN PROGRESS  4.  FGA to be assessed and pt to improve to at least 22/30 for decreased fall risk. Baseline: 14/30 12/16/23 Goal status: IN PROGRESS 12/16/23  5.  Pt will ambulate at least 1000 ft, outdoor surfaces, least restrictive device, mod I, for return to walking [place] and hiking activities. Baseline:  Goal status: IN PROGRESS  6.  DHI score to improve to 10 or less for decreased dizziness impacting function. Baseline: 28  Goal status: IN PROGRESS  ASSESSMENT:  CLINICAL IMPRESSION: Resisted walking via pushing/pulling w/ good stability and assumes more narrow BOS. Gaze stabilization activities under increased demands of walking and bouncing with good  ability to detect/perceive errors for correction. Difficulty with multisensory static balance under eyes closed conditions w/ head movements. Balance for anticipatory and reactive strategies with good performance to push-release with 1-2 steps required for anterior/posterior direction. Continued sessions to advance POC details to improve safety with mobility on uneven surfaces  OBJECTIVE IMPAIRMENTS: Abnormal gait, decreased balance, decreased mobility, difficulty walking, and impaired vision/preception.   ACTIVITY LIMITATIONS: reach over head, locomotion level, and caring for others  PARTICIPATION LIMITATIONS: meal prep, cleaning, laundry, driving, shopping, community activity, yard work, and hiking, horseback riding  PERSONAL FACTORS: 3+ comorbidities: see above PMH and recent medical hx are also affecting patient's functional outcome.   REHAB POTENTIAL: Good  CLINICAL DECISION MAKING: Evolving/moderate complexity  EVALUATION COMPLEXITY: Moderate   PLAN:  PT FREQUENCY: 2x/week  PT DURATION: 8 weeks plus eval  PLANNED INTERVENTIONS: 97750- Physical Performance Testing, 97110-Therapeutic exercises, 97530- Therapeutic activity, 97112- Neuromuscular re-education, 97535- Self Care, [id number]- Manual therapy, [phone]- Gait training, Patient/Family education, Balance training, Stair training, and Vestibular training  PLAN FOR NEXT SESSION: work towards stair negotiation without handrail; VOR,compliant surface balance   5:15 PM, 01/02/24 M. Kelly Sudie Bandel, PT, DPT Physical Therapist- [place] Office Number: [phone]

## 2024-01-07 ENCOUNTER — Ambulatory Visit

## 2024-01-07 DIAGNOSIS — R2681 Unsteadiness on feet: Secondary | ICD-10-CM

## 2024-01-07 DIAGNOSIS — R42 Dizziness and giddiness: Secondary | ICD-10-CM | POA: Diagnosis not present

## 2024-01-07 DIAGNOSIS — R2689 Other abnormalities of gait and mobility: Secondary | ICD-10-CM

## 2024-01-07 NOTE — Therapy (Signed)
 OUTPATIENT PHYSICAL THERAPY VESTIBULAR TREATMENT     Patient Name: Edward Marquez MRN: [id number] DOB:[birth year], 73 y.o., male Today's Date: 01/07/2024  END OF SESSION:  PT End of Session - 01/07/24 1549     Visit Number 7    Number of Visits 17    Date for PT Re-Evaluation 02/07/24    Authorization Type [employer] Medicare    Authorization Time Period approved 17 PT visits from 12/10/2023-02/04/2024.    Progress Note Due on Visit 10    PT Start Time 1548    PT Stop Time 1615    PT Time Calculation (min) 27 min    Equipment Utilized During Treatment Gait belt   gait belt needed due to imbalance   Activity Tolerance Patient tolerated treatment well    Behavior During Therapy WFL for tasks assessed/performed                Past Medical History:  Diagnosis Date   Aortic valve defect    Atrial fibrillation (HCC)    CHF (congestive heart failure) (HCC)    Clotting disorder (HCC)    Gout    Hypertension    Peripheral vascular disease (HCC)    Past Surgical History:  Procedure Laterality Date   ABDOMINAL AORTOGRAM W/LOWER EXTREMITY N/A 05/03/2017   Procedure: ABDOMINAL AORTOGRAM W/LOWER EXTREMITY;  Surgeon: Dannis Dy, MD;  Location: [facility] INVASIVE CV LAB;  Service: Cardiovascular;  Laterality: N/A;   AORTIC VALVE REPLACEMENT     BYPASS GRAFT POPLITEAL TO TIBIAL Left 05/03/2017   Procedure: LEFT POPLITEAL TO ANTERIOR TIBIAL ARTERY BYPASS WITH VEIN;  Surgeon: Dannis Dy, MD;  Location: [facility] OR;  Service: Vascular;  Laterality: Left;   LOWER EXTREMITY ANGIOGRAPHY N/A 06/24/2017   Procedure: LOWER EXTREMITY ANGIOGRAPHY - Right;  Surgeon: Dannis Dy, MD;  Location: [facility] INVASIVE CV LAB;  Service: Cardiovascular;  Laterality: N/A;   PACEMAKER INSERTION     PERIPHERAL VASCULAR BALLOON ANGIOPLASTY  05/03/2017   Procedure: PERIPHERAL VASCULAR BALLOON ANGIOPLASTY;  Surgeon: Dannis Dy, MD;  Location: [facility] INVASIVE CV LAB;  Service: Cardiovascular;;    PERIPHERAL VASCULAR BALLOON ANGIOPLASTY Right 06/24/2017   Procedure: PERIPHERAL VASCULAR BALLOON ANGIOPLASTY;  Surgeon: Dannis Dy, MD;  Location: [facility] INVASIVE CV LAB;  Service: Cardiovascular;  Laterality: Right;  ant-tibial / perneal   VEIN HARVEST Left 05/03/2017   Procedure: POPITEAL VEIN HARVEST;  Surgeon: Dannis Dy, MD;  Location: [facility] OR;  Service: Vascular;  Laterality: Left;   Patient Active Problem List   Diagnosis Date Noted   Acute endocarditis 09/02/2023   Bacterial endocarditis 09/02/2023   Streptococcal bacteremia 09/02/2023   Positive blood culture 09/01/2023   Stage 3a chronic kidney disease (CKD) (HCC) 09/01/2023   CAP (community acquired pneumonia) 08/31/2023   Positive colorectal cancer screening using Cologuard test 08/29/2022   Hyperlipidemia LDL goal <70 07/31/2022   Hammer toes, bilateral 07/31/2022   Effusion, left knee 03/14/2022   Atrial fibrillation (HCC) 08/14/2017   History of implantable cardioverter-defibrillator (ICD) placement 08/14/2017   PAD (peripheral artery disease) (HCC) 05/03/2017   PVD (peripheral vascular disease) (HCC) 04/30/2017    PCP: Daved Eriksson, MD  REFERRING PROVIDER: Daved Eriksson, MD   REFERRING DIAG:  [phone] (ICD-10-CM) - Personal history of transient ischemic attack (TIA), and cerebral infarction without residual deficits  R42 (ICD-10-CM) - Dizziness and giddiness    THERAPY DIAG:  Unsteadiness on feet  Other abnormalities of gait and mobility  Dizziness and giddiness  ONSET DATE:  12/05/2023  Rationale for Evaluation and Treatment: Rehabilitation  SUBJECTIVE:   SUBJECTIVE STATEMENT: Doing ok, haven't done too much outdoors work bc of rain   Pt accompanied by: self  PERTINENT HISTORY: Patient recently had a prolonged stay in hospital from 09/14/2023 to 10/01/23 secondary to acute bacterial endocarditis, 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  PMH  includesAAA, A-fib, Cardiomyopathy, CHF, hx of aortic valve replacement, HTN, HLD, ICD, PVD, CVA, tinnitus  PAIN:  Are you having pain? No  PRECAUTIONS: Fall and Other: Avoid lifting 20#    Has ICD; pt did have sternal precautions, but he reports they have been lifted. RED FLAGS: None   WEIGHT BEARING RESTRICTIONS: No  FALLS: Has patient fallen in last 6 months? Yes. Number of falls 1  LIVING ENVIRONMENT: Lives with: lives with their spouse Lives in: House/apartment Stairs: 5 steps to enter front; 12 steps into second floor (bedroom); rails Has following equipment at home: Single point cane and Walker - 2 wheeled  PLOF: Independent  PATIENT GOALS: To get back to normal, prior level of activities  OBJECTIVE:   TODAY'S TREATMENT: 01/07/24 Activity Comments  Backwards walk/forward jog   Jumping level/up/down    Suitcase carry   Dynamic balance            TODAY'S TREATMENT: 01/02/24 Activity Comments  Sled push/pull 6x45 ft 45#  Stair ambulation -trials w/out HR and SBA-CGA -trials unilat 10# carry  Walking VOR x 1   Static visual acuity line 4 Dynamic Visual acuity line 2-3   Seated on physioball bouncing -static gaze -VOR  Multisensory balance   Righting reactions 1-2 steps required        HOME EXERCISE PROGRAM Access Code: [email or web address] URL: [email or web address] Date: 12/31/2023 Prepared by: [facility] - Outpatient  Rehab - [facility]  Program Notes perform in a corner with a chair in front for safety  Exercises - Standing Gaze Stabilization with Head Rotation  - 1 x daily - 5 x weekly - 2-3 sets - 30 sec hold - Standing Gaze Stabilization with Head Nod  - 1 x daily - 5 x weekly - 2-3 sets - 30 sec hold - Standing March with Counter Support  - 1 x daily - 5 x weekly - 2 sets - 20 reps - Standing Balance with Eyes Closed on Foam  - 1 x daily - 5 x weekly - 2 sets - 30 sec  hold   PATIENT EDUCATION: Education details: HEP update and  consolidation with edu for safety  Person educated: Patient Education method: Explanation, Demonstration, Tactile cues, Verbal cues, and Handouts Education comprehension: verbalized understanding and returned demonstration   Note: Objective measures were completed at Evaluation unless otherwise noted.  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   SYMPTOM BEHAVIOR:  Subjective history:  See above-wife provided most of hx.  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  Since the CVA, he has had visual disturbances with standing and walking-more vertical motion of eyes and blurred vision.  Non-Vestibular symptoms: tinnitus  Type of dizziness: Imbalance (Disequilibrium), Oscillopsia, and Unsteady with head/body turns  Frequency: throughout the day   Duration: more as he gets tired  Aggravating factors: Induced by motion: looking up at the ceiling, turning body quickly, and turning head quickly and Worse with fatigue  Relieving factors: slow movements  Progression of symptoms: better  OCULOMOTOR EXAM:  Ocular Alignment: normal  Ocular ROM: No Limitations  Spontaneous Nystagmus: absent  Gaze-Induced Nystagmus: right beating with right gaze and left beating with left  gaze  Smooth Pursuits: intact  Saccades: intact  Convergence/Divergence: 2-3 cm    VESTIBULAR - OCULAR REFLEX:   Slow VOR: Positive Bilaterally horizontal and vertical; blurriness  VOR Cancellation: Comment: no symptoms horizontal; would incr symptoms vertical  Head-Impulse Test: HIT Right: slowed HIT Left: slowed      MOTION SENSITIVITY: *NOT TESTED AT EVAL*  Motion Sensitivity Quotient Intensity: 0 = none, 1 = Lightheaded, 2 = Mild, 3 = Moderate, 4 = Severe, 5 = Vomiting  Intensity  1. Sitting to supine   2. Supine to L side   3. Supine to R side   4. Supine to sitting   5. L Hallpike-Dix   6. Up from L    7. R Hallpike-Dix   8. Up from R    9. Sitting, head tipped to L knee   10. Head up from L knee   11. Sitting, head tipped to R knee   12. Head up from R knee   13. Sitting head turns x5   14.Sitting head nods x5   15. In stance, 180 turn to L    16. In stance, 180 turn to R      FUNCTIONAL GAIT: 10 meter walk test: 19.91 sec = 1.65 ft/sec   M-CTSIB  Condition 1: Firm Surface, EO 30 Sec, Normal Sway  Condition 2: Firm Surface, EC 30 Sec, Mild Sway  Condition 3: Foam Surface, EO 30 Sec, Mild Sway  Condition 4: Foam Surface, EC 13.44 Sec, Severe Sway                                                                                                                              TREATMENT DATE: 12/10/2023    PATIENT EDUCATION: Education details: Eval results, POC; continue current exercises that patient was doing at home for standing VOR Person educated: Patient and Spouse Education method: Explanation Education comprehension: verbalized understanding  HOME EXERCISE PROGRAM:  GOALS: Goals reviewed with patient? Yes  SHORT TERM GOALS: Target date: 01/10/2024  Pt will be independent with HEP for improved balance, gait. Baseline: Goal status: IN PROGRESS  2.  Pt will improve gait velocity to at least 1.8 ft/sec for improved gait efficiency and  safety. Baseline: 1.65  ft/sec Goal status: IN PROGRESS  3.  Pt will perform Condition 4 on MCTSIB for 30 sec with mod sway for improved overall balance. Baseline: 13.34 sec Goal status: IN PROGRESS  LONG TERM GOALS: Target date: 02/07/2024  Pt will be independent with advanced HEP for improved balance, gait. Baseline:  Goal status: IN PROGRESS  2.  Pt will improve gait velocity to at least 2.3 ft/sec for improved gait efficiency and safety. Baseline: 1.65 ft/sec Goal status: IN PROGRESS  3.  Pt will improve MCTSIB Condition 4 to mild sway for 30 seconds for improved overall balance. Baseline: 13.34 sec Goal status: IN PROGRESS  4.  FGA to be assessed and pt to improve to at least 22/30 for decreased fall risk. Baseline: 14/30 12/16/23 Goal status: IN PROGRESS 12/16/23  5.  Pt will ambulate at least 1000 ft, outdoor surfaces, least restrictive device, mod I, for return to walking [place] and [place] activities. Baseline:  Goal status: IN PROGRESS  6.  DHI score to improve to 10 or less for decreased dizziness impacting function. Baseline: 28  Goal status: IN PROGRESS  ASSESSMENT:  CLINICAL IMPRESSION: Activities for coordination/BLE power to improve mobility and explosive power for lifting heavy objects from ground. Able to initiate light jogging today with good mechanics and improved BOS. Discussion of lifting mechanics to improve safety with managing heavy/unbalanced weight. More difficulty with unilateral loading and balance maintenance. Continued sessions to advance POC details to improve safety with ambulation on uneven ground and lifting heavy/bulky objects.   OBJECTIVE IMPAIRMENTS: Abnormal gait, decreased balance, decreased mobility, difficulty walking, and impaired vision/preception.   ACTIVITY LIMITATIONS: reach over head, locomotion level, and caring for others  PARTICIPATION LIMITATIONS: meal prep, cleaning, laundry, driving, shopping, community activity, yard  work, and hiking, horseback riding  PERSONAL FACTORS: 3+ comorbidities: see above PMH and recent medical hx are also affecting patient's functional outcome.   REHAB POTENTIAL: Good  CLINICAL DECISION MAKING: Evolving/moderate complexity  EVALUATION COMPLEXITY: Moderate   PLAN:  PT FREQUENCY: 2x/week  PT DURATION: 8 weeks plus eval  PLANNED INTERVENTIONS: 97750- Physical Performance Testing, 97110-Therapeutic exercises, 97530- Therapeutic activity, 97112- Neuromuscular re-education, 97535- Self Care, 16109- Manual therapy, [phone]- Gait training, Patient/Family education, Balance training, Stair training, and Vestibular training  PLAN FOR NEXT SESSION: work towards stair negotiation without handrail; VOR,compliant surface balance   3:50 PM, 01/07/24 M. Kelly Wissam Resor, PT, DPT Physical Therapist- [place] Office Number: [phone]

## 2024-01-09 ENCOUNTER — Ambulatory Visit

## 2024-01-09 DIAGNOSIS — R42 Dizziness and giddiness: Secondary | ICD-10-CM | POA: Diagnosis not present

## 2024-01-09 DIAGNOSIS — R2681 Unsteadiness on feet: Secondary | ICD-10-CM

## 2024-01-09 DIAGNOSIS — R2689 Other abnormalities of gait and mobility: Secondary | ICD-10-CM | POA: Diagnosis not present

## 2024-01-09 NOTE — Therapy (Signed)
 OUTPATIENT PHYSICAL THERAPY VESTIBULAR TREATMENT     Patient Name: Edward Marquez MRN: [id number] DOB:Mar 08, 1951, 73 y.o., male Today's Date: 01/09/2024  END OF SESSION:  PT End of Session - 01/09/24 1548     Visit Number 8    Number of Visits 17    Date for PT Re-Evaluation 02/07/24    Authorization Type [employer] Medicare    Authorization Time Period approved 17 PT visits from 12/10/2023-02/04/2024.    Progress Note Due on Visit 10    PT Start Time 1548    PT Stop Time 1633    PT Time Calculation (min) 45 min    Equipment Utilized During Treatment Gait belt   gait belt needed due to imbalance   Activity Tolerance Patient tolerated treatment well    Behavior During Therapy WFL for tasks assessed/performed                Past Medical History:  Diagnosis Date   Aortic valve defect    Atrial fibrillation (HCC)    CHF (congestive heart failure) (HCC)    Clotting disorder (HCC)    Gout    Hypertension    Peripheral vascular disease (HCC)    Past Surgical History:  Procedure Laterality Date   ABDOMINAL AORTOGRAM W/LOWER EXTREMITY N/A 05/03/2017   Procedure: ABDOMINAL AORTOGRAM W/LOWER EXTREMITY;  Surgeon: Dannis Dy, MD;  Location: [facility] INVASIVE CV LAB;  Service: Cardiovascular;  Laterality: N/A;   AORTIC VALVE REPLACEMENT     BYPASS GRAFT POPLITEAL TO TIBIAL Left 05/03/2017   Procedure: LEFT POPLITEAL TO ANTERIOR TIBIAL ARTERY BYPASS WITH VEIN;  Surgeon: Dannis Dy, MD;  Location: [facility] OR;  Service: Vascular;  Laterality: Left;   LOWER EXTREMITY ANGIOGRAPHY N/A 06/24/2017   Procedure: LOWER EXTREMITY ANGIOGRAPHY - Right;  Surgeon: Dannis Dy, MD;  Location: [facility] INVASIVE CV LAB;  Service: Cardiovascular;  Laterality: N/A;   PACEMAKER INSERTION     PERIPHERAL VASCULAR BALLOON ANGIOPLASTY  05/03/2017   Procedure: PERIPHERAL VASCULAR BALLOON ANGIOPLASTY;  Surgeon: Dannis Dy, MD;  Location: [facility] INVASIVE CV LAB;  Service: Cardiovascular;;    PERIPHERAL VASCULAR BALLOON ANGIOPLASTY Right 06/24/2017   Procedure: PERIPHERAL VASCULAR BALLOON ANGIOPLASTY;  Surgeon: Dannis Dy, MD;  Location: [facility] INVASIVE CV LAB;  Service: Cardiovascular;  Laterality: Right;  ant-tibial / perneal   VEIN HARVEST Left 05/03/2017   Procedure: POPITEAL VEIN HARVEST;  Surgeon: Dannis Dy, MD;  Location: [facility] OR;  Service: Vascular;  Laterality: Left;   Patient Active Problem List   Diagnosis Date Noted   Acute endocarditis 09/02/2023   Bacterial endocarditis 09/02/2023   Streptococcal bacteremia 09/02/2023   Positive blood culture 09/01/2023   Stage 3a chronic kidney disease (CKD) (HCC) 09/01/2023   CAP (community acquired pneumonia) 08/31/2023   Positive colorectal cancer screening using Cologuard test 08/29/2022   Hyperlipidemia LDL goal <70 07/31/2022   Hammer toes, bilateral 07/31/2022   Effusion, left knee 03/14/2022   Atrial fibrillation (HCC) 08/14/2017   History of implantable cardioverter-defibrillator (ICD) placement 08/14/2017   PAD (peripheral artery disease) (HCC) 05/03/2017   PVD (peripheral vascular disease) (HCC) 04/30/2017    PCP: Daved Eriksson, MD  REFERRING PROVIDER: Daved Eriksson, MD   REFERRING DIAG:  [phone] (ICD-10-CM) - Personal history of transient ischemic attack (TIA), and cerebral infarction without residual deficits  R42 (ICD-10-CM) - Dizziness and giddiness    THERAPY DIAG:  Unsteadiness on feet  Other abnormalities of gait and mobility  Dizziness and giddiness  ONSET DATE:  12/05/2023  Rationale for Evaluation and Treatment: Rehabilitation  SUBJECTIVE:   SUBJECTIVE STATEMENT: Doing ok, haven't done too much outdoors work bc of rain   Pt accompanied by: self  PERTINENT HISTORY: Patient recently had a prolonged stay in hospital from 09/14/2023 to 10/01/23 secondary to acute bacterial endocarditis, 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  PMH  includesAAA, A-fib, Cardiomyopathy, CHF, hx of aortic valve replacement, HTN, HLD, ICD, PVD, CVA, tinnitus  PAIN:  Are you having pain? No  PRECAUTIONS: Fall and Other: Avoid lifting 20#    Has ICD; pt did have sternal precautions, but he reports they have been lifted. RED FLAGS: None   WEIGHT BEARING RESTRICTIONS: No  FALLS: Has patient fallen in last 6 months? Yes. Number of falls 1  LIVING ENVIRONMENT: Lives with: lives with their spouse Lives in: House/apartment Stairs: 5 steps to enter front; 12 steps into second floor (bedroom); rails Has following equipment at home: Single point cane and Walker - 2 wheeled  PLOF: Independent  PATIENT GOALS: To get back to normal, prior level of activities  OBJECTIVE:   TODAY'S TREATMENT: 01/09/24 Activity Comments  Dynamic balance   Resisted walking Blue t-band posterior belt  Step up and overhead reach Alt LE/UE  Multisensory balance Static and dynamic demands  Gait speed w/ and without cane See below           HOME EXERCISE PROGRAM Access Code: [email or web address] URL: [email or web address] Date: 12/31/2023 Prepared by: [facility] - Outpatient  Rehab - [facility]  Program Notes perform in a corner with a chair in front for safety  Exercises - Standing Gaze Stabilization with Head Rotation  - 1 x daily - 5 x weekly - 2-3 sets - 30 sec hold - Standing Gaze Stabilization with Head Nod  - 1 x daily - 5 x weekly - 2-3 sets - 30 sec hold - Standing March with Counter Support  - 1 x daily - 5 x weekly - 2 sets - 20 reps - Standing Balance with Eyes Closed on Foam  - 1 x daily - 5 x weekly - 2 sets - 30 sec  hold   PATIENT EDUCATION: Education details: HEP update and consolidation with edu for safety  Person educated: Patient Education method: Explanation, Demonstration, Tactile cues, Verbal cues, and Handouts Education comprehension: verbalized understanding and returned demonstration   Note: Objective measures  were completed at Evaluation unless otherwise noted.  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   SYMPTOM BEHAVIOR:  Subjective history: See above-wife provided most of hx.  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  Since the CVA, he has had visual disturbances with standing and walking-more vertical motion of eyes and blurred vision.  Non-Vestibular symptoms: tinnitus  Type of dizziness: Imbalance (Disequilibrium), Oscillopsia, and Unsteady with head/body turns  Frequency: throughout the day   Duration: more as he gets tired  Aggravating factors: Induced by motion: looking up at the ceiling, turning body quickly, and turning head quickly and Worse with fatigue  Relieving factors: slow movements  Progression of symptoms: better  OCULOMOTOR EXAM:  Ocular Alignment: normal  Ocular ROM: No Limitations  Spontaneous Nystagmus: absent  Gaze-Induced Nystagmus: right beating with right gaze and left beating with left gaze  Smooth Pursuits: intact  Saccades: intact  Convergence/Divergence: 2-3 cm    VESTIBULAR - OCULAR REFLEX:   Slow VOR: Positive Bilaterally horizontal and vertical; blurriness  VOR Cancellation: Comment: no symptoms horizontal; would incr  symptoms vertical  Head-Impulse Test: HIT Right: slowed HIT Left: slowed      MOTION SENSITIVITY: *NOT TESTED AT EVAL*  Motion Sensitivity Quotient Intensity: 0 = none, 1 = Lightheaded, 2 = Mild, 3 = Moderate, 4 = Severe, 5 = Vomiting  Intensity  1. Sitting to supine   2. Supine to L side   3. Supine to R side   4. Supine to sitting   5. L Hallpike-Dix   6. Up from L    7. R Hallpike-Dix   8. Up from R    9. Sitting, head tipped to L knee   10. Head up from L knee   11. Sitting, head tipped to R knee   12. Head up from R knee   13. Sitting head turns x5   14.Sitting head nods x5   15. In stance, 180 turn to L    16. In stance, 180 turn to R      FUNCTIONAL GAIT: 10 meter walk test: 19.91 sec = 1.65 ft/sec   M-CTSIB  Condition 1: Firm Surface, EO 30 Sec, Normal Sway  Condition 2: Firm Surface, EC 30 Sec, Mild Sway  Condition 3: Foam Surface, EO 30 Sec, Mild Sway  Condition 4: Foam Surface, EC 13.44 Sec, Severe Sway                                                                                                                              TREATMENT DATE: 12/10/2023    PATIENT EDUCATION: Education details: Eval results, POC; continue current exercises that patient was doing at home for standing VOR Person educated: Patient and Spouse Education method: Explanation Education comprehension: verbalized understanding  HOME EXERCISE PROGRAM:  GOALS: Goals reviewed with patient? Yes  SHORT TERM GOALS: Target date: 01/10/2024  Pt will be independent with HEP for improved balance, gait. Baseline: Goal status: MET  2.  Pt will improve gait velocity to at least 1.8 ft/sec for improved gait efficiency and safety. Baseline: 1.65 ft/sec; 2.7 ft/sec w/ cane 3.2 ft/sec w/out cane Goal status: MET (01/09/24)  3.  Pt will perform Condition 4 on MCTSIB for 30 sec with mod sway for improved overall balance. Baseline: 13.34 sec; 30 sec mild Goal status: MET (01/09/24)  LONG  TERM GOALS: Target date: 02/07/2024  Pt will be independent with advanced HEP for improved balance, gait. Baseline:  Goal status: IN PROGRESS  2.  Pt will improve gait velocity to at least 2.3 ft/sec for improved gait efficiency and safety. Baseline: 1.65 ft/sec Goal status: IN PROGRESS  3.  Pt will improve MCTSIB Condition 4 to mild sway for 30 seconds for improved overall balance. Baseline: 13.34 sec Goal status: IN PROGRESS  4.  FGA to be assessed and pt to improve to at least 22/30 for decreased fall risk. Baseline: 14/30 12/16/23 Goal status: IN PROGRESS 12/16/23  5.  Pt will ambulate at least 1000 ft, outdoor surfaces, least restrictive device, mod I, for return to walking [place] and hiking activities. Baseline:  Goal status: IN PROGRESS  6.  DHI score to improve to 10 or less for decreased dizziness impacting function. Baseline: 28  Goal status: IN PROGRESS  ASSESSMENT:  CLINICAL IMPRESSION: Initiated with dynamic standing balance to improve motor control, coordination, and facilitate righting reactions.  Greater difficulty with LLE in single limb support. Resisted walking to facilitate narrow BOS and promote greater speed/amplitude reciprocal arm swing. Multisensory balance challenges requiring weight shifting, reaching outside BOS, and postural adjustment when negotiating obstacles.  Demo improved performance with STG assessed meeting 3/3 with improvement in gait speed with and without AD.  Notes ongoing difficulty with ambulation on uneven surfaces and with heavy lifting movements (hay bale, 50# bag of feed). Reports perceived oscillopsia has been improving and notes reduced visual distortion when walking and moving head/gaze.  Continued sessions to advance POC details.   OBJECTIVE IMPAIRMENTS: Abnormal gait, decreased balance, decreased mobility, difficulty walking, and impaired vision/preception.   ACTIVITY LIMITATIONS: reach over head, locomotion level, and caring for  others  PARTICIPATION LIMITATIONS: meal prep, cleaning, laundry, driving, shopping, community activity, yard work, and hiking, horseback riding  PERSONAL FACTORS: 3+ comorbidities: see above PMH and recent medical hx are also affecting patient's functional outcome.   REHAB POTENTIAL: Good  CLINICAL DECISION MAKING: Evolving/moderate complexity  EVALUATION COMPLEXITY: Moderate   PLAN:  PT FREQUENCY: 2x/week  PT DURATION: 8 weeks plus eval  PLANNED INTERVENTIONS: 97750- Physical Performance Testing, 97110-Therapeutic exercises, 97530- Therapeutic activity, 97112- Neuromuscular re-education, 97535- Self Care, 16109- Manual therapy, [phone]- Gait training, Patient/Family education, Balance training, Stair training, and Vestibular training  PLAN FOR NEXT SESSION: work towards stair negotiation without handrail; VOR,compliant surface balance, walking/carrying on grass    3:48 PM, 01/09/24 M. Kelly Shaniqwa Horsman, PT, DPT Physical Therapist- [place] Office Number: [phone]

## 2024-01-13 NOTE — Therapy (Incomplete)
 OUTPATIENT PHYSICAL THERAPY VESTIBULAR TREATMENT     Patient Name: Edward Marquez MRN: [id number] DOB:[birth year], 73 y.o., male Today's Date: 01/13/2024  END OF SESSION:       Past Medical History:  Diagnosis Date   Aortic valve defect    Atrial fibrillation (HCC)    CHF (congestive heart failure) (HCC)    Clotting disorder (HCC)    Gout    Hypertension    Peripheral vascular disease (HCC)    Past Surgical History:  Procedure Laterality Date   ABDOMINAL AORTOGRAM W/LOWER EXTREMITY N/A 05/03/2017   Procedure: ABDOMINAL AORTOGRAM W/LOWER EXTREMITY;  Surgeon: Dannis Dy, MD;  Location: [facility] INVASIVE CV LAB;  Service: Cardiovascular;  Laterality: N/A;   AORTIC VALVE REPLACEMENT     BYPASS GRAFT POPLITEAL TO TIBIAL Left 05/03/2017   Procedure: LEFT POPLITEAL TO ANTERIOR TIBIAL ARTERY BYPASS WITH VEIN;  Surgeon: Dannis Dy, MD;  Location: [facility] OR;  Service: Vascular;  Laterality: Left;   LOWER EXTREMITY ANGIOGRAPHY N/A 06/24/2017   Procedure: LOWER EXTREMITY ANGIOGRAPHY - Right;  Surgeon: Dannis Dy, MD;  Location: [facility] INVASIVE CV LAB;  Service: Cardiovascular;  Laterality: N/A;   PACEMAKER INSERTION     PERIPHERAL VASCULAR BALLOON ANGIOPLASTY  05/03/2017   Procedure: PERIPHERAL VASCULAR BALLOON ANGIOPLASTY;  Surgeon: Dannis Dy, MD;  Location: [facility] INVASIVE CV LAB;  Service: Cardiovascular;;   PERIPHERAL VASCULAR BALLOON ANGIOPLASTY Right 06/24/2017   Procedure: PERIPHERAL VASCULAR BALLOON ANGIOPLASTY;  Surgeon: Dannis Dy, MD;  Location: [facility] INVASIVE CV LAB;  Service: Cardiovascular;  Laterality: Right;  ant-tibial / perneal   VEIN HARVEST Left 05/03/2017   Procedure: POPITEAL VEIN HARVEST;  Surgeon: Dannis Dy, MD;  Location: [facility] OR;  Service: Vascular;  Laterality: Left;   Patient Active Problem List   Diagnosis Date Noted   Acute endocarditis 09/02/2023   Bacterial endocarditis 09/02/2023   Streptococcal bacteremia  09/02/2023   Positive blood culture 09/01/2023   Stage 3a chronic kidney disease (CKD) (HCC) 09/01/2023   CAP (community acquired pneumonia) 08/31/2023   Positive colorectal cancer screening using Cologuard test 08/29/2022   Hyperlipidemia LDL goal <70 07/31/2022   Hammer toes, bilateral 07/31/2022   Effusion, left knee 03/14/2022   Atrial fibrillation (HCC) 08/14/2017   History of implantable cardioverter-defibrillator (ICD) placement 08/14/2017   PAD (peripheral artery disease) (HCC) 05/03/2017   PVD (peripheral vascular disease) (HCC) 04/30/2017    PCP: Daved Eriksson, MD  REFERRING PROVIDER: Daved Eriksson, MD   REFERRING DIAG:  [phone] (ICD-10-CM) - Personal history of transient ischemic attack (TIA), and cerebral infarction without residual deficits  R42 (ICD-10-CM) - Dizziness and giddiness    THERAPY DIAG:  No diagnosis found.  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  PMH includesAAA, A-fib, Cardiomyopathy, CHF, hx of aortic valve replacement, HTN, HLD, ICD, PVD, CVA, tinnitus  PAIN:  Are you having pain? No  PRECAUTIONS: Fall and Other: Avoid lifting 20#    Has ICD; pt did have sternal precautions, but he reports they have been lifted. RED FLAGS: None   WEIGHT BEARING RESTRICTIONS: No  FALLS: Has patient fallen in last 6 months? Yes. Number of falls 1  LIVING ENVIRONMENT: Lives with: lives with their spouse Lives in: House/apartment Stairs: 5 steps to enter front; 12 steps into second floor (bedroom); rails Has following equipment at home: Single point cane and Walker - 2  wheeled  PLOF: Independent  PATIENT GOALS: To get back to normal, prior level of activities  OBJECTIVE:     TODAY'S TREATMENT: 01/14/24 Activity Comments                        TODAY'S TREATMENT: 01/09/24 Activity Comments  Dynamic balance   Resisted walking Blue t-band posterior belt  Step up and overhead reach Alt LE/UE  Multisensory balance Static and dynamic demands  Gait speed w/ and without cane See below           HOME EXERCISE PROGRAM Access Code: [email or web address] URL: [email or web address] Date: 12/31/2023 Prepared by: [facility]  Rehab - [facility]  Program Notes perform in a corner with a chair in front for safety  Exercises - Standing Gaze Stabilization with Head Rotation  - 1 x daily - 5 x weekly - 2-3 sets - 30 sec hold - Standing Gaze Stabilization with Head Nod  - 1 x daily - 5 x weekly - 2-3 sets - 30 sec hold - Standing March with Counter Support  - 1 x daily - 5 x weekly - 2 sets - 20 reps - Standing Balance with Eyes Closed on Foam  - 1 x daily - 5 x weekly - 2 sets - 30 sec  hold   PATIENT EDUCATION: Education details: HEP update and consolidation with edu for safety  Person educated: Patient Education method: Explanation, Demonstration, Tactile cues, Verbal cues, and Handouts Education comprehension: verbalized understanding and returned demonstration   Note: Objective measures were completed at Evaluation unless otherwise noted.  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   SYMPTOM BEHAVIOR:  Subjective history: See above-wife provided most of hx.  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  Since the CVA, he has had visual disturbances with standing and walking-more vertical motion of eyes and blurred vision.  Non-Vestibular symptoms: tinnitus  Type of dizziness: Imbalance (Disequilibrium), Oscillopsia, and Unsteady with head/body turns  Frequency: throughout the day   Duration: more as he gets tired  Aggravating factors: Induced by motion: looking up at the ceiling, turning body quickly, and turning head quickly and Worse with fatigue  Relieving factors: slow movements  Progression of symptoms: better  OCULOMOTOR EXAM:  Ocular Alignment: normal  Ocular ROM: No Limitations  Spontaneous Nystagmus: absent  Gaze-Induced Nystagmus: right beating with right gaze and left beating with left gaze  Smooth Pursuits: intact  Saccades: intact  Convergence/Divergence: 2-3 cm    VESTIBULAR - OCULAR REFLEX:   Slow VOR: Positive Bilaterally horizontal and vertical; blurriness  VOR Cancellation: Comment:  no symptoms horizontal; would incr symptoms vertical  Head-Impulse Test: HIT Right: slowed HIT Left: slowed      MOTION SENSITIVITY: *NOT TESTED AT EVAL*  Motion Sensitivity Quotient Intensity: 0 = none, 1 = Lightheaded, 2 = Mild, 3 = Moderate, 4 = Severe, 5 = Vomiting  Intensity  1. Sitting to supine   2. Supine to L side   3. Supine to R side   4. Supine to sitting   5. L Hallpike-Dix   6. Up from L    7. R Hallpike-Dix   8. Up from R    9. Sitting, head tipped to L knee   10. Head up from L knee   11. Sitting, head tipped to R knee   12. Head up from R  knee   13. Sitting head turns x5   14.Sitting head nods x5   15. In stance, 180 turn to L    16. In stance, 180 turn to R      FUNCTIONAL GAIT: 10 meter walk test: 19.91 sec = 1.65 ft/sec   M-CTSIB  Condition 1: Firm Surface, EO 30 Sec, Normal Sway  Condition 2: Firm Surface, EC 30 Sec, Mild Sway  Condition 3: Foam Surface, EO 30 Sec, Mild Sway  Condition 4: Foam Surface, EC 13.44 Sec, Severe Sway                                                                                                                              TREATMENT DATE: 12/10/2023    PATIENT EDUCATION: Education details: Eval results, POC; continue current exercises that patient was doing at home for standing VOR Person educated: Patient and Spouse Education method: Explanation Education comprehension: verbalized understanding  HOME EXERCISE PROGRAM:  GOALS: Goals reviewed with patient? Yes  SHORT TERM GOALS: Target date: 01/10/2024  Pt will be independent with HEP for improved balance, gait. Baseline: Goal status: MET  2.  Pt will improve gait velocity to at least 1.8 ft/sec for improved gait efficiency and safety. Baseline: 1.65 ft/sec; 2.7 ft/sec w/ cane 3.2 ft/sec w/out cane Goal status: MET (01/09/24)  3.  Pt will perform Condition 4 on MCTSIB for 30 sec with mod sway for improved overall balance. Baseline: 13.34 sec; 30 sec mild Goal status: MET (01/09/24)  LONG TERM GOALS: Target date: 02/07/2024  Pt will be independent with advanced HEP for improved balance, gait. Baseline:  Goal status: IN PROGRESS  2.  Pt will improve gait velocity to at least 2.3 ft/sec for improved gait efficiency and safety. Baseline: 1.65 ft/sec Goal status: IN PROGRESS  3.  Pt will improve MCTSIB Condition 4 to mild sway for 30 seconds for improved overall balance. Baseline: 13.34 sec Goal status: IN PROGRESS  4.  FGA to be assessed and pt to improve to at least 22/30 for decreased fall risk. Baseline: 14/30  12/16/23 Goal status: IN PROGRESS 12/16/23  5.  Pt will ambulate at least 1000  ft, outdoor surfaces, least restrictive device, mod I, for return to walking [place] and [place] activities. Baseline:  Goal status: IN PROGRESS  6.  DHI score to improve to 10 or less for decreased dizziness impacting function. Baseline: 28  Goal status: IN PROGRESS  ASSESSMENT:  CLINICAL IMPRESSION: Initiated with dynamic standing balance to improve motor control, coordination, and facilitate righting reactions.  Greater difficulty with LLE in single limb support. Resisted walking to facilitate narrow BOS and promote greater speed/amplitude reciprocal arm swing. Multisensory balance challenges requiring weight shifting, reaching outside BOS, and postural adjustment when negotiating obstacles.  Demo improved performance with STG assessed meeting 3/3 with improvement in gait speed with and without AD.  Notes ongoing difficulty with ambulation on uneven surfaces and with heavy lifting movements (hay bale, 50# bag of feed). Reports perceived oscillopsia has been improving and notes reduced visual distortion when walking and moving head/gaze.  Continued sessions to advance POC details.   OBJECTIVE IMPAIRMENTS: Abnormal gait, decreased balance, decreased mobility, difficulty walking, and impaired vision/preception.   ACTIVITY LIMITATIONS: reach over head, locomotion level, and caring for others  PARTICIPATION LIMITATIONS: meal prep, cleaning, laundry, driving, shopping, community activity, yard work, and hiking, horseback riding  PERSONAL FACTORS: 3+ comorbidities: see above PMH and recent medical hx are also affecting patient's functional outcome.   REHAB POTENTIAL: Good  CLINICAL DECISION MAKING: Evolving/moderate complexity  EVALUATION COMPLEXITY: Moderate   PLAN:  PT FREQUENCY: 2x/week  PT DURATION: 8 weeks plus eval  PLANNED INTERVENTIONS: 97750- Physical Performance Testing, 97110-Therapeutic  exercises, 97530- Therapeutic activity, 97112- Neuromuscular re-education, 97535- Self Care, 16109- Manual therapy, [phone]- Gait training, Patient/Family education, Balance training, Stair training, and Vestibular training  PLAN FOR NEXT SESSION: work towards stair negotiation without handrail; VOR,compliant surface balance, walking/carrying on grass

## 2024-01-14 ENCOUNTER — Ambulatory Visit: Admitting: Physical Therapy

## 2024-01-16 ENCOUNTER — Ambulatory Visit: Attending: Family Medicine

## 2024-01-16 DIAGNOSIS — R2689 Other abnormalities of gait and mobility: Secondary | ICD-10-CM | POA: Insufficient documentation

## 2024-01-16 DIAGNOSIS — R42 Dizziness and giddiness: Secondary | ICD-10-CM | POA: Insufficient documentation

## 2024-01-16 DIAGNOSIS — R2681 Unsteadiness on feet: Secondary | ICD-10-CM | POA: Insufficient documentation

## 2024-01-22 NOTE — Therapy (Signed)
 OUTPATIENT PHYSICAL THERAPY VESTIBULAR PROGRESS NOTE/RECERT     Patient Name: Edward Marquez MRN: [id number] DOB:03-25-1951, 73 y.o., male Today's Date: 01/23/2024   Progress Note Reporting Period 12/10/23 to 01/23/24  See note below for Objective Data and Assessment of Progress/Goals.      END OF SESSION:  PT End of Session - 01/23/24 1606     Visit Number 9    Number of Visits 13    Date for PT Re-Evaluation 02/20/24    Authorization Type [employer] Medicare    Authorization Time Period approved 17 PT visits from 12/10/2023-02/04/2024.    Progress Note Due on Visit 19    PT Start Time 1535    PT Stop Time 1613    PT Time Calculation (min) 38 min    Equipment Utilized During Treatment Gait belt   gait belt needed due to imbalance   Activity Tolerance Patient tolerated treatment well    Behavior During Therapy WFL for tasks assessed/performed              Past Medical History:  Diagnosis Date   Aortic valve defect    Atrial fibrillation (HCC)    CHF (congestive heart failure) (HCC)    Clotting disorder (HCC)    Gout    Hypertension    Peripheral vascular disease (HCC)    Past Surgical History:  Procedure Laterality Date   ABDOMINAL AORTOGRAM W/LOWER EXTREMITY N/A 05/03/2017   Procedure: ABDOMINAL AORTOGRAM W/LOWER EXTREMITY;  Surgeon: Dannis Dy, MD;  Location: [facility] INVASIVE CV LAB;  Service: Cardiovascular;  Laterality: N/A;   AORTIC VALVE REPLACEMENT     BYPASS GRAFT POPLITEAL TO TIBIAL Left 05/03/2017   Procedure: LEFT POPLITEAL TO ANTERIOR TIBIAL ARTERY BYPASS WITH VEIN;  Surgeon: Dannis Dy, MD;  Location: [facility] OR;  Service: Vascular;  Laterality: Left;   LOWER EXTREMITY ANGIOGRAPHY N/A 06/24/2017   Procedure: LOWER EXTREMITY ANGIOGRAPHY - Right;  Surgeon: Dannis Dy, MD;  Location: [facility] INVASIVE CV LAB;  Service: Cardiovascular;  Laterality: N/A;   PACEMAKER INSERTION     PERIPHERAL VASCULAR BALLOON ANGIOPLASTY  05/03/2017    Procedure: PERIPHERAL VASCULAR BALLOON ANGIOPLASTY;  Surgeon: Dannis Dy, MD;  Location: [facility] INVASIVE CV LAB;  Service: Cardiovascular;;   PERIPHERAL VASCULAR BALLOON ANGIOPLASTY Right 06/24/2017   Procedure: PERIPHERAL VASCULAR BALLOON ANGIOPLASTY;  Surgeon: Dannis Dy, MD;  Location: [facility] INVASIVE CV LAB;  Service: Cardiovascular;  Laterality: Right;  ant-tibial / perneal   VEIN HARVEST Left 05/03/2017   Procedure: POPITEAL VEIN HARVEST;  Surgeon: Dannis Dy, MD;  Location: [facility] OR;  Service: Vascular;  Laterality: Left;   Patient Active Problem List   Diagnosis Date Noted   Acute endocarditis 09/02/2023   Bacterial endocarditis 09/02/2023   Streptococcal bacteremia 09/02/2023   Positive blood culture 09/01/2023   Stage 3a chronic kidney disease (CKD) (HCC) 09/01/2023   CAP (community acquired pneumonia) 08/31/2023   Positive colorectal cancer screening using Cologuard test 08/29/2022   Hyperlipidemia LDL goal <70 07/31/2022   Hammer toes, bilateral 07/31/2022   Effusion, left knee 03/14/2022   Atrial fibrillation (HCC) 08/14/2017   History of implantable cardioverter-defibrillator (ICD) placement 08/14/2017   PAD (peripheral artery disease) (HCC) 05/03/2017   PVD (peripheral vascular disease) (HCC) 04/30/2017    PCP: Daved Eriksson, MD  REFERRING PROVIDER: Daved Eriksson, MD   REFERRING DIAG:  [phone] (ICD-10-CM) - Personal history of transient ischemic attack (TIA), and cerebral infarction without residual deficits  R42 (ICD-10-CM) - Dizziness and giddiness  THERAPY DIAG:  Unsteadiness on feet  Other abnormalities of gait and mobility  Dizziness and giddiness  ONSET DATE: 12/05/2023  Rationale for Evaluation and Treatment: Rehabilitation  SUBJECTIVE:   SUBJECTIVE STATEMENT: I'm better. Been busy. Reports that balance is significantly better and reports vision is less blurry. Forgot my cane.    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  PMH includesAAA, A-fib, Cardiomyopathy, CHF, hx of aortic valve replacement, HTN, HLD, ICD, PVD, CVA, tinnitus  PAIN:  Are you having pain? No  PRECAUTIONS: Fall and Other: Avoid lifting 20#    Has ICD; pt did have sternal precautions, but he reports they have been lifted. RED FLAGS: None   WEIGHT BEARING RESTRICTIONS: No  FALLS: Has patient fallen in last 6 months? Yes. Number of falls 1  LIVING ENVIRONMENT: Lives with: lives with their spouse Lives in: House/apartment Stairs: 5 steps to enter front; 12 steps into second floor (bedroom); rails Has following equipment at home: Single point cane and Walker - 2 wheeled  PLOF: Independent  PATIENT GOALS: To get back to normal, prior level of activities  OBJECTIVE:     TODAY'S TREATMENT: 01/23/24 Activity Comments  69M walk 12.57 sec  (2.6 ft/sec)  MCTSIB #4 7 sec before L LOB  FGA 19/30  DHI 4/100  Gait outside on grass with and without AD ~293ft Mild instability with cane, moderate instability without        PATIENT EDUCATION: Education details: progress towards goals and remaining impairments; recert, new goals Person educated: Patient Education method: Explanation and Demonstration Education comprehension: verbalized understanding    HOME EXERCISE PROGRAM Access Code: [email or web address] URL: [email or web address] Date: 12/31/2023 Prepared by: [facility]  Program Notes perform in a corner with a chair in front for safety  Exercises - Standing Gaze Stabilization with Head Rotation  - 1 x daily - 5 x weekly - 2-3 sets - 30 sec hold - Standing Gaze Stabilization with Head Nod  - 1 x daily - 5 x weekly - 2-3 sets - 30 sec hold - Standing March with Counter  Support  - 1 x daily - 5 x weekly - 2 sets - 20 reps - Standing Balance with Eyes Closed on Foam  - 1 x daily - 5 x weekly - 2 sets - 30 sec  hold   PATIENT EDUCATION: Education details: HEP update and consolidation with edu for safety  Person educated: Patient Education method: Explanation, Demonstration, Tactile cues, Verbal cues, and Handouts Education comprehension: verbalized understanding and returned demonstration   Note: Objective measures were completed at Evaluation unless otherwise noted.  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   SYMPTOM BEHAVIOR:  Subjective history: See above-wife provided most of hx.  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  Since the CVA, he has had visual disturbances with standing and walking-more vertical motion of eyes and blurred vision.  Non-Vestibular symptoms: tinnitus  Type of dizziness: Imbalance (Disequilibrium), Oscillopsia, and Unsteady with head/body turns  Frequency: throughout the day   Duration: more as he gets tired  Aggravating factors: Induced by motion: looking up at the ceiling, turning body quickly, and turning head quickly and Worse with fatigue  Relieving factors: slow movements  Progression of  symptoms: better  OCULOMOTOR EXAM:  Ocular Alignment: normal  Ocular ROM: No Limitations  Spontaneous Nystagmus: absent  Gaze-Induced Nystagmus: right beating with right gaze and left beating with left gaze  Smooth Pursuits: intact  Saccades: intact  Convergence/Divergence: 2-3 cm    VESTIBULAR - OCULAR REFLEX:   Slow VOR: Positive Bilaterally horizontal and vertical; blurriness  VOR Cancellation: Comment: no symptoms horizontal; would incr symptoms vertical  Head-Impulse Test: HIT Right: slowed HIT Left: slowed      MOTION SENSITIVITY: *NOT TESTED AT EVAL*  Motion Sensitivity Quotient Intensity: 0 = none, 1 = Lightheaded, 2 = Mild, 3 = Moderate, 4 = Severe, 5 = Vomiting  Intensity  1. Sitting to supine   2. Supine to L side   3. Supine to R side   4. Supine to sitting   5. L Hallpike-Dix   6. Up from L    7. R Hallpike-Dix   8. Up from R    9. Sitting, head tipped to L knee   10. Head up from L knee   11. Sitting, head tipped to R knee   12. Head up from R knee   13. Sitting head turns x5   14.Sitting head nods x5   15. In stance, 180 turn to L    16. In stance, 180 turn to R      FUNCTIONAL GAIT: 10 meter walk test: 19.91 sec = 1.65 ft/sec   M-CTSIB  Condition 1: Firm Surface, EO 30 Sec, Normal Sway  Condition 2: Firm Surface, EC 30 Sec, Mild Sway  Condition 3: Foam Surface, EO 30 Sec, Mild Sway  Condition 4: Foam Surface, EC 13.44 Sec, Severe Sway                                                                                                                              TREATMENT DATE: 12/10/2023    PATIENT EDUCATION: Education details: Eval results, POC; continue current exercises that patient was doing at home for standing VOR Person educated: Patient and Spouse Education method: Explanation Education comprehension: verbalized understanding  HOME EXERCISE PROGRAM:  GOALS: Goals reviewed with patient? Yes  SHORT TERM GOALS: Target date:  01/10/2024  Pt will be independent with HEP for improved balance, gait. Baseline: Goal status: MET  2.  Pt will improve gait velocity to at least  1.8 ft/sec for improved gait efficiency and safety. Baseline: 1.65 ft/sec; 2.7 ft/sec w/ cane 3.2 ft/sec w/out cane Goal status: MET (01/09/24)  3.  Pt will perform Condition 4 on MCTSIB for 30 sec with mod sway for improved overall balance. Baseline: 13.34 sec; 30 sec mild Goal status: MET (01/09/24)  LONG TERM GOALS: Target date: 02/20/2024  Pt will be independent with advanced HEP for improved balance, gait. Baseline:  Goal status: IN PROGRESS 01/23/24  2.  Pt will improve gait velocity to at least 2.3 ft/sec for improved gait efficiency and safety. Baseline: 1.65 ft/sec; 01/23/24;  (2.6 ft/sec) 01/23/24 Goal status: MET 01/23/24  3.  Pt will improve MCTSIB Condition 4 to mild sway for 30 seconds for improved overall balance. Baseline: 13.34 sec; 7 sec before LOB 01/23/24  Goal status: IN PROGRESS 01/23/24   4.  FGA to be assessed and pt to improve to at least 22/30 for decreased fall risk. Baseline: 14/30 12/16/23; 19/30 01/23/24 Goal status: IN PROGRESS 01/23/24  5.  Pt will ambulate at least 1000 ft, outdoor surfaces, least restrictive device, mod I, for return to walking his [place] and hiking activities. Baseline: Mild instability with cane, moderate instability without 01/23/24 Goal status: IN PROGRESS 01/23/24  6.  DHI score to improve to 10 or less for decreased dizziness impacting function. Baseline: 28; 4/100 01/23/24 Goal status: MET 01/23/24  7.  Patient to report ability to perform barn activities like lifting from the ground and carrying heavy items.  Baseline: unable  Goal status: INITIAL 01/23/24   ASSESSMENT:  CLINICAL IMPRESSION: Patient arrived to session with report of improvement in balance and blurred vision/oscillopsia. Patient scored 19/30 on FGA, indicating an increased risk of falls but improved from initial  assessment. Patient's multisensory balance testing revealed remaining imbalance. Patient's gait speed is now ft/sec. Patient demonstrated good stability ambulating on outdoor surfaces including sidewalk and grass. DHI score has reduced down to 4/100. Patient reports remaining difficulty with heavy lifting and carrying to keep his horse. Would benefit from additional skilled PT services 1x/week for 4 week to address remaining impairments.   OBJECTIVE IMPAIRMENTS: Abnormal gait, decreased balance, decreased mobility, difficulty walking, and impaired vision/preception.   ACTIVITY LIMITATIONS: reach over head, locomotion level, and caring for others  PARTICIPATION LIMITATIONS: meal prep, cleaning, laundry, driving, shopping, community activity, yard work, and hiking, horseback riding  PERSONAL FACTORS: 3+ comorbidities: see above PMH and recent medical hx are also affecting patient's functional outcome.   REHAB POTENTIAL: Good  CLINICAL DECISION MAKING: Evolving/moderate complexity  EVALUATION COMPLEXITY: Moderate   PLAN:  PT FREQUENCY: 1x/week  PT DURATION: 4 weeks   PLANNED INTERVENTIONS: 97750- Physical Performance Testing, 97110-Therapeutic exercises, 97530- Therapeutic activity, 97112- Neuromuscular re-education, 97535- Self Care, 16109- Manual therapy, [phone]- Gait training, Patient/Family education, Balance training, Stair training, and Vestibular training  PLAN FOR NEXT SESSION: work on heavy lifting from the ground and heavy carrying to simulate barn activities; work towards riding his horse? work towards stair negotiation without handrail; VOR,compliant surface balance, walking/carrying on grass    Thaddeus Filippo, PT, DPT 01/23/24 4:16 PM  [facility] at [facility] [address], [address] [place], [place] [address] Phone # [phone] Fax # [phone]

## 2024-01-23 ENCOUNTER — Ambulatory Visit: Admitting: Physical Therapy

## 2024-01-23 ENCOUNTER — Encounter: Payer: Self-pay | Admitting: Physical Therapy

## 2024-01-23 DIAGNOSIS — R2689 Other abnormalities of gait and mobility: Secondary | ICD-10-CM

## 2024-01-23 DIAGNOSIS — R2681 Unsteadiness on feet: Secondary | ICD-10-CM | POA: Diagnosis not present

## 2024-01-23 DIAGNOSIS — R42 Dizziness and giddiness: Secondary | ICD-10-CM

## 2024-01-24 DIAGNOSIS — K118 Other diseases of salivary glands: Secondary | ICD-10-CM | POA: Diagnosis not present

## 2024-01-31 ENCOUNTER — Ambulatory Visit

## 2024-01-31 DIAGNOSIS — R42 Dizziness and giddiness: Secondary | ICD-10-CM

## 2024-01-31 DIAGNOSIS — R2681 Unsteadiness on feet: Secondary | ICD-10-CM | POA: Diagnosis not present

## 2024-01-31 DIAGNOSIS — R2689 Other abnormalities of gait and mobility: Secondary | ICD-10-CM

## 2024-01-31 NOTE — Therapy (Signed)
 OUTPATIENT PHYSICAL THERAPY VESTIBULAR PROGRESS NOTE/RECERT     Patient Name: Edward Marquez MRN: [id number] DOB:[birth year], 73 y.o., male Today's Date: 01/31/2024    END OF SESSION:  PT End of Session - 01/31/24 0856     Visit Number 10    Number of Visits 13    Date for PT Re-Evaluation 02/20/24    Authorization Type [employer] Medicare    Authorization Time Period approved 17 PT visits from 12/10/2023-02/04/2024.    Progress Note Due on Visit 19    PT Start Time 0845    PT Stop Time 0930    PT Time Calculation (min) 45 min    Equipment Utilized During Treatment Gait belt   gait belt needed due to imbalance   Activity Tolerance Patient tolerated treatment well    Behavior During Therapy WFL for tasks assessed/performed               Past Medical History:  Diagnosis Date   Aortic valve defect    Atrial fibrillation (HCC)    CHF (congestive heart failure) (HCC)    Clotting disorder (HCC)    Gout    Hypertension    Peripheral vascular disease (HCC)    Past Surgical History:  Procedure Laterality Date   ABDOMINAL AORTOGRAM W/LOWER EXTREMITY N/A 05/03/2017   Procedure: ABDOMINAL AORTOGRAM W/LOWER EXTREMITY;  Surgeon: Dannis Dy, MD;  Location: [facility] INVASIVE CV LAB;  Service: Cardiovascular;  Laterality: N/A;   AORTIC VALVE REPLACEMENT     BYPASS GRAFT POPLITEAL TO TIBIAL Left 05/03/2017   Procedure: LEFT POPLITEAL TO ANTERIOR TIBIAL ARTERY BYPASS WITH VEIN;  Surgeon: Dannis Dy, MD;  Location: [facility] OR;  Service: Vascular;  Laterality: Left;   LOWER EXTREMITY ANGIOGRAPHY N/A 06/24/2017   Procedure: LOWER EXTREMITY ANGIOGRAPHY - Right;  Surgeon: Dannis Dy, MD;  Location: [facility] INVASIVE CV LAB;  Service: Cardiovascular;  Laterality: N/A;   PACEMAKER INSERTION     PERIPHERAL VASCULAR BALLOON ANGIOPLASTY  05/03/2017   Procedure: PERIPHERAL VASCULAR BALLOON ANGIOPLASTY;  Surgeon: Dannis Dy, MD;  Location: [facility] INVASIVE CV LAB;  Service:  Cardiovascular;;   PERIPHERAL VASCULAR BALLOON ANGIOPLASTY Right 06/24/2017   Procedure: PERIPHERAL VASCULAR BALLOON ANGIOPLASTY;  Surgeon: Dannis Dy, MD;  Location: [facility] INVASIVE CV LAB;  Service: Cardiovascular;  Laterality: Right;  ant-tibial / perneal   VEIN HARVEST Left 05/03/2017   Procedure: POPITEAL VEIN HARVEST;  Surgeon: Dannis Dy, MD;  Location: [facility] OR;  Service: Vascular;  Laterality: Left;   Patient Active Problem List   Diagnosis Date Noted   Acute endocarditis 09/02/2023   Bacterial endocarditis 09/02/2023   Streptococcal bacteremia 09/02/2023   Positive blood culture 09/01/2023   Stage 3a chronic kidney disease (CKD) (HCC) 09/01/2023   CAP (community acquired pneumonia) 08/31/2023   Positive colorectal cancer screening using Cologuard test 08/29/2022   Hyperlipidemia LDL goal <70 07/31/2022   Hammer toes, bilateral 07/31/2022   Effusion, left knee 03/14/2022   Atrial fibrillation (HCC) 08/14/2017   History of implantable cardioverter-defibrillator (ICD) placement 08/14/2017   PAD (peripheral artery disease) (HCC) 05/03/2017   PVD (peripheral vascular disease) (HCC) 04/30/2017    PCP: Daved Eriksson, MD  REFERRING PROVIDER: Daved Eriksson, MD   REFERRING DIAG:  [phone] (ICD-10-CM) - Personal history of transient ischemic attack (TIA), and cerebral infarction without residual deficits  R42 (ICD-10-CM) - Dizziness and giddiness    THERAPY DIAG:  Unsteadiness on feet  Other abnormalities of gait and mobility  Dizziness and giddiness  ONSET DATE: 12/05/2023  Rationale for Evaluation and Treatment: Rehabilitation  SUBJECTIVE:   SUBJECTIVE STATEMENT: Things are getting better, balance is improving, walking longer distance but noting the environement still bouncy  Pt accompanied by: self  PERTINENT HISTORY: Patient recently had a prolonged stay in hospital from 09/14/2023 to 10/01/23 secondary to acute bacterial endocarditis, which  resulted in a 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  PMH includesAAA, A-fib, Cardiomyopathy, CHF, hx of aortic valve replacement, HTN, HLD, ICD, PVD, CVA, tinnitus  PAIN:  Are you having pain? No  PRECAUTIONS: Fall and Other: Avoid lifting 20#    Has ICD; pt did have sternal precautions, but he reports they have been lifted. RED FLAGS: None   WEIGHT BEARING RESTRICTIONS: No  FALLS: Has patient fallen in last 6 months? Yes. Number of falls 1  LIVING ENVIRONMENT: Lives with: lives with their spouse Lives in: House/apartment Stairs: 5 steps to enter front; 12 steps into second floor (bedroom); rails Has following equipment at home: Single point cane and Walker - 2 wheeled  PLOF: Independent  PATIENT GOALS: To get back to normal, prior level of activities  OBJECTIVE:   TODAY'S TREATMENT: 01/31/24 Activity Comments  Ambulation outdoors  W/ cane and 15# suitcase carry over grass  Deadlift to hang clean w/ 15# KB   balance Multisensory challenges feet apart/close eyes open/closed postural perturbations  Med ball twist and toss Black med ball             TODAY'S TREATMENT: 01/23/24 Activity Comments  64M walk 12.57 sec  (2.6 ft/sec)  MCTSIB #4 7 sec before L LOB  FGA 19/30  DHI 4/100  Gait outside on grass with and without AD ~238ft Mild instability with cane, moderate instability without        PATIENT EDUCATION: Education details: progress towards goals and remaining impairments; recert, new goals Person educated: Patient Education method: Explanation and Demonstration Education comprehension: verbalized understanding    HOME EXERCISE PROGRAM Access Code: [email or web address] URL: [email or web address] Date: 12/31/2023 Prepared by: [facility] - Outpatient  Rehab - [facility]  Program Notes perform in a corner with a chair in front for safety  Exercises - Standing Gaze Stabilization with Head Rotation  - 1 x daily - 5 x weekly - 2-3 sets  - 30 sec hold - Standing Gaze Stabilization with Head Nod  - 1 x daily - 5 x weekly - 2-3 sets - 30 sec hold - Standing March with Counter Support  - 1 x daily - 5 x weekly - 2 sets - 20 reps - Standing Balance with Eyes Closed on Foam  - 1 x daily - 5 x weekly - 2 sets - 30 sec  hold   PATIENT EDUCATION: Education details: HEP update and consolidation with edu for safety  Person educated: Patient Education method: Explanation, Demonstration, Tactile cues, Verbal cues, and Handouts Education comprehension: verbalized understanding and returned demonstration   Note: Objective measures were completed at Evaluation unless otherwise noted.  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   SYMPTOM BEHAVIOR:  Subjective history: See above-wife provided most of hx.  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  Since the CVA, he has had visual disturbances with standing and walking-more vertical motion of eyes and blurred vision.  Non-Vestibular symptoms: tinnitus  Type of dizziness: Imbalance (Disequilibrium), Oscillopsia, and Unsteady with head/body turns  Frequency: throughout the day   Duration: more as he gets tired  Aggravating factors: Induced by motion: looking up  at the ceiling, turning body quickly, and turning head quickly and Worse with fatigue  Relieving factors: slow movements  Progression of symptoms: better  OCULOMOTOR EXAM:  Ocular Alignment: normal  Ocular ROM: No Limitations  Spontaneous Nystagmus: absent  Gaze-Induced Nystagmus: right beating with right gaze and left beating with left gaze  Smooth Pursuits: intact  Saccades: intact  Convergence/Divergence: 2-3 cm    VESTIBULAR - OCULAR REFLEX:   Slow VOR: Positive Bilaterally horizontal and vertical; blurriness  VOR Cancellation: Comment: no symptoms horizontal; would incr symptoms vertical  Head-Impulse Test: HIT Right: slowed HIT Left: slowed      MOTION SENSITIVITY: *NOT TESTED AT EVAL*  Motion Sensitivity Quotient Intensity: 0 = none, 1 = Lightheaded, 2 = Mild, 3 = Moderate, 4 = Severe, 5 = Vomiting  Intensity  1. Sitting to supine   2. Supine to L side   3. Supine to R side   4. Supine to sitting   5. L Hallpike-Dix   6. Up from L    7. R Hallpike-Dix   8. Up from R    9. Sitting, head tipped to L knee   10. Head up from L knee   11. Sitting, head tipped to R knee   12. Head up from R knee   13. Sitting head turns x5   14.Sitting head nods x5   15. In stance, 180 turn to L    16. In stance, 180 turn to R      FUNCTIONAL GAIT: 10 meter walk test: 19.91 sec = 1.65 ft/sec   M-CTSIB  Condition 1: Firm Surface, EO 30 Sec, Normal Sway  Condition 2: Firm Surface, EC 30 Sec, Mild Sway  Condition 3: Foam Surface, EO 30 Sec, Mild Sway  Condition 4: Foam Surface, EC 13.44 Sec, Severe Sway                                                                                                                              TREATMENT DATE: 12/10/2023    PATIENT EDUCATION: Education details: Eval results, POC; continue current exercises that patient was doing at home for standing VOR Person educated: Patient and Spouse Education method: Explanation Education  comprehension: verbalized understanding  HOME EXERCISE PROGRAM:  GOALS: Goals reviewed with patient? Yes  SHORT TERM GOALS: Target date: 01/10/2024  Pt will be independent with HEP for improved balance, gait. Baseline: Goal status: MET  2.  Pt will improve gait velocity to at least 1.8 ft/sec for improved gait efficiency and safety. Baseline: 1.65 ft/sec; 2.7 ft/sec w/ cane 3.2 ft/sec w/out cane Goal status: MET (01/09/24)  3.  Pt will perform Condition 4 on MCTSIB for 30 sec with mod sway for improved overall balance. Baseline: 13.34 sec; 30 sec mild Goal status: MET (01/09/24)  LONG TERM GOALS: Target date: 02/20/2024  Pt will be independent with advanced HEP for improved balance, gait. Baseline:  Goal status: IN PROGRESS 01/23/24  2.  Pt will improve gait velocity to at least 2.3 ft/sec for improved gait efficiency and safety. Baseline: 1.65 ft/sec; 01/23/24;  (2.6 ft/sec) 01/23/24 Goal status: MET 01/23/24  3.  Pt will improve MCTSIB Condition 4 to mild sway for 30 seconds for improved overall balance. Baseline: 13.34 sec; 7 sec before LOB 01/23/24  Goal status: IN PROGRESS 01/23/24   4.  FGA to be assessed and pt to improve to at least 22/30 for decreased fall risk. Baseline: 14/30 12/16/23; 19/30 01/23/24 Goal status: IN PROGRESS 01/23/24  5.  Pt will ambulate at least 1000 ft, outdoor surfaces, least restrictive device, mod I, for return to walking [place] and hiking activities. Baseline: Mild instability with cane, moderate instability without 01/23/24 Goal status: IN PROGRESS 01/23/24  6.  DHI score to improve to 10 or less for decreased dizziness impacting function. Baseline: 28; 4/100 01/23/24 Goal status: MET 01/23/24  7.  Patient to report ability to perform barn activities like lifting from the ground and carrying heavy items.  Baseline: unable  Goal status: INITIAL 01/23/24   ASSESSMENT:  CLINICAL IMPRESSION: Able to ambulate uneven surfaces w/ cane modified  independent while carrying 15# suitcase. Focus on multisensory balance to facilitate righting reactions and postural stability.  Initiated power lifting movements to improve safety and capability for lifting heavy/large objects without LOB. Difficulty with narrow BOS and compliant surfaces w/ frequent retro LOB  OBJECTIVE IMPAIRMENTS: Abnormal gait, decreased balance, decreased mobility, difficulty walking, and impaired vision/preception.   ACTIVITY LIMITATIONS: reach over head, locomotion level, and caring for others  PARTICIPATION LIMITATIONS: meal prep, cleaning, laundry, driving, shopping, community activity, yard work, and hiking, horseback riding  PERSONAL FACTORS: 3+ comorbidities: see above PMH and recent medical hx are also affecting patient's functional outcome.   REHAB POTENTIAL: Good  CLINICAL DECISION MAKING: Evolving/moderate complexity  EVALUATION COMPLEXITY: Moderate   PLAN:  PT FREQUENCY: 1x/week  PT DURATION: 4 weeks   PLANNED INTERVENTIONS: 97750- Physical Performance Testing, 97110-Therapeutic exercises, 97530- Therapeutic activity, 97112- Neuromuscular re-education, 97535- Self Care, 96045- Manual therapy, [phone]- Gait training, Patient/Family education, Balance training, Stair training, and Vestibular training  PLAN FOR NEXT SESSION: work on heavy lifting from the ground and heavy carrying to simulate barn activities; work towards riding his horse? work towards stair negotiation without handrail; VOR,compliant surface balance, walking/carrying on grass    9:59 AM, 01/31/24 M. Kelly Leveda Kendrix, PT, DPT Physical Therapist-  Office Number: [phone]

## 2024-02-06 ENCOUNTER — Ambulatory Visit

## 2024-02-06 DIAGNOSIS — R2681 Unsteadiness on feet: Secondary | ICD-10-CM | POA: Diagnosis not present

## 2024-02-06 DIAGNOSIS — R2689 Other abnormalities of gait and mobility: Secondary | ICD-10-CM

## 2024-02-06 DIAGNOSIS — R42 Dizziness and giddiness: Secondary | ICD-10-CM

## 2024-02-06 NOTE — Therapy (Signed)
 OUTPATIENT PHYSICAL THERAPY VESTIBULAR PROGRESS NOTE     Patient Name: Edward Marquez MRN: [id number] DOB:Dec 06, 1950, 73 y.o., male Today's Date: 02/06/2024    END OF SESSION:  PT End of Session - 02/06/24 1533     Visit Number 11    Number of Visits 13    Date for PT Re-Evaluation 02/20/24    Authorization Type [employer] Medicare    Authorization Time Period approved 17 PT visits from 12/10/2023-02/04/2024.    Progress Note Due on Visit 19    PT Start Time 1530    PT Stop Time 1615    PT Time Calculation (min) 45 min    Equipment Utilized During Treatment Gait belt   gait belt needed due to imbalance   Activity Tolerance Patient tolerated treatment well    Behavior During Therapy WFL for tasks assessed/performed               Past Medical History:  Diagnosis Date   Aortic valve defect    Atrial fibrillation (HCC)    CHF (congestive heart failure) (HCC)    Clotting disorder (HCC)    Gout    Hypertension    Peripheral vascular disease (HCC)    Past Surgical History:  Procedure Laterality Date   ABDOMINAL AORTOGRAM W/LOWER EXTREMITY N/A 05/03/2017   Procedure: ABDOMINAL AORTOGRAM W/LOWER EXTREMITY;  Surgeon: Eliza Lonni RAMAN, MD;  Location: [facility] INVASIVE CV LAB;  Service: Cardiovascular;  Laterality: N/A;   AORTIC VALVE REPLACEMENT     BYPASS GRAFT POPLITEAL TO TIBIAL Left 05/03/2017   Procedure: LEFT POPLITEAL TO ANTERIOR TIBIAL ARTERY BYPASS WITH VEIN;  Surgeon: Eliza Lonni RAMAN, MD;  Location: [facility] OR;  Service: Vascular;  Laterality: Left;   LOWER EXTREMITY ANGIOGRAPHY N/A 06/24/2017   Procedure: LOWER EXTREMITY ANGIOGRAPHY - Right;  Surgeon: Eliza Lonni RAMAN, MD;  Location: [facility] INVASIVE CV LAB;  Service: Cardiovascular;  Laterality: N/A;   PACEMAKER INSERTION     PERIPHERAL VASCULAR BALLOON ANGIOPLASTY  05/03/2017   Procedure: PERIPHERAL VASCULAR BALLOON ANGIOPLASTY;  Surgeon: Eliza Lonni RAMAN, MD;  Location: [facility] INVASIVE CV LAB;  Service:  Cardiovascular;;   PERIPHERAL VASCULAR BALLOON ANGIOPLASTY Right 06/24/2017   Procedure: PERIPHERAL VASCULAR BALLOON ANGIOPLASTY;  Surgeon: Eliza Lonni RAMAN, MD;  Location: [facility] INVASIVE CV LAB;  Service: Cardiovascular;  Laterality: Right;  ant-tibial / perneal   VEIN HARVEST Left 05/03/2017   Procedure: POPITEAL VEIN HARVEST;  Surgeon: Eliza Lonni RAMAN, MD;  Location: [facility] OR;  Service: Vascular;  Laterality: Left;   Patient Active Problem List   Diagnosis Date Noted   Acute endocarditis 09/02/2023   Bacterial endocarditis 09/02/2023   Streptococcal bacteremia 09/02/2023   Positive blood culture 09/01/2023   Stage 3a chronic kidney disease (CKD) (HCC) 09/01/2023   CAP (community acquired pneumonia) 08/31/2023   Positive colorectal cancer screening using Cologuard test 08/29/2022   Hyperlipidemia LDL goal <70 07/31/2022   Hammer toes, bilateral 07/31/2022   Effusion, left knee 03/14/2022   Atrial fibrillation (HCC) 08/14/2017   History of implantable cardioverter-defibrillator (ICD) placement 08/14/2017   PAD (peripheral artery disease) (HCC) 05/03/2017   PVD (peripheral vascular disease) (HCC) 04/30/2017    PCP: Jhon Elveria LABOR, MD  REFERRING PROVIDER: Jhon Elveria LABOR, MD   REFERRING DIAG:  [phone] (ICD-10-CM) - Personal history of transient ischemic attack (TIA), and cerebral infarction without residual deficits  R42 (ICD-10-CM) - Dizziness and giddiness    THERAPY DIAG:  Unsteadiness on feet  Other abnormalities of gait and mobility  Dizziness and giddiness  ONSET DATE: 12/05/2023  Rationale for Evaluation and Treatment: Rehabilitation  SUBJECTIVE:   SUBJECTIVE STATEMENT: Doing ok  Pt accompanied by: self  PERTINENT HISTORY: Patient recently had a prolonged stay in hospital from 09/14/2023 to 10/01/23 secondary to acute bacterial endocarditis, 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  PMH includesAAA, A-fib, Cardiomyopathy,  CHF, hx of aortic valve replacement, HTN, HLD, ICD, PVD, CVA, tinnitus  PAIN:  Are you having pain? No  PRECAUTIONS: Fall and Other: Avoid lifting 20#    Has ICD; pt did have sternal precautions, but he reports they have been lifted. RED FLAGS: None   WEIGHT BEARING RESTRICTIONS: No  FALLS: Has patient fallen in last 6 months? Yes. Number of falls 1  LIVING ENVIRONMENT: Lives with: lives with their spouse Lives in: House/apartment Stairs: 5 steps to enter front; 12 steps into second floor (bedroom); rails Has following equipment at home: Single point cane and Walker - 2 wheeled  PLOF: Independent  PATIENT GOALS: To get back to normal, prior level of activities  OBJECTIVE:   TODAY'S TREATMENT: 02/06/24 Activity Comments  treadmill -1.8 mph x 2 min -dodging obstacles x 1.5 min -8% grade x 30 sec 2x4 min rounds  Reactive and proactive balance strategies On firm and foam  Single limb support--high step hurdles forward/lateral   4-square step From tape lines to 6 hurdles, foam cushion, 6 step                PATIENT EDUCATION: Education details: progress towards goals and remaining impairments; recert, new goals Person educated: Patient Education method: Explanation and Demonstration Education comprehension: verbalized understanding    HOME EXERCISE PROGRAM Access Code: [email or web address] URL: [email or web address] Date: 12/31/2023 Prepared by: [facility] - Outpatient  Rehab - [facility]  Program Notes perform in a corner with a chair in front for safety  Exercises - Standing Gaze Stabilization with Head Rotation  - 1 x daily - 5 x weekly - 2-3 sets - 30 sec hold - Standing Gaze Stabilization with Head Nod  - 1 x daily - 5 x weekly - 2-3 sets - 30 sec hold - Standing March with Counter Support  - 1 x daily - 5 x weekly - 2 sets - 20 reps - Standing Balance with Eyes Closed on Foam  - 1 x daily - 5 x weekly - 2 sets - 30 sec  hold   PATIENT  EDUCATION: Education details: HEP update and consolidation with edu for safety  Person educated: Patient Education method: Explanation, Demonstration, Tactile cues, Verbal cues, and Handouts Education comprehension: verbalized understanding and returned demonstration   Note: Objective measures were completed at Evaluation unless otherwise noted.  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   SYMPTOM BEHAVIOR:  Subjective history: See above-wife provided most of hx.  Pt  had sepsis>septic shock>cerebellar CVA.  Since the CVA, he has had visual disturbances with standing and walking-more vertical motion of eyes and blurred vision.  Non-Vestibular symptoms: tinnitus  Type of dizziness: Imbalance (Disequilibrium), Oscillopsia, and Unsteady with head/body turns  Frequency: throughout the day   Duration: more as he gets tired  Aggravating factors: Induced by motion: looking up at the ceiling, turning body quickly, and turning head quickly and Worse with fatigue  Relieving factors: slow movements  Progression of symptoms: better  OCULOMOTOR EXAM:  Ocular Alignment: normal  Ocular ROM: No Limitations  Spontaneous Nystagmus: absent  Gaze-Induced Nystagmus: right beating  with right gaze and left beating with left gaze  Smooth Pursuits: intact  Saccades: intact  Convergence/Divergence: 2-3 cm    VESTIBULAR - OCULAR REFLEX:   Slow VOR: Positive Bilaterally horizontal and vertical; blurriness  VOR Cancellation: Comment: no symptoms horizontal; would incr symptoms vertical  Head-Impulse Test: HIT Right: slowed HIT Left: slowed      MOTION SENSITIVITY: *NOT TESTED AT EVAL*  Motion Sensitivity Quotient Intensity: 0 = none, 1 = Lightheaded, 2 = Mild, 3 = Moderate, 4 = Severe, 5 = Vomiting  Intensity  1. Sitting to supine   2. Supine to L side   3. Supine to R side   4. Supine to sitting   5. L Hallpike-Dix   6. Up from L    7. R Hallpike-Dix   8. Up from R    9. Sitting, head tipped to L knee   10. Head up from L knee   11. Sitting, head tipped to R knee   12. Head up from R knee   13. Sitting head turns x5   14.Sitting head nods x5   15. In stance, 180 turn to L    16. In stance, 180 turn to R      FUNCTIONAL GAIT: 10 meter walk test: 19.91 sec = 1.65 ft/sec   M-CTSIB  Condition 1: Firm Surface, EO 30 Sec, Normal Sway  Condition 2: Firm Surface, EC 30 Sec, Mild Sway  Condition 3: Foam Surface, EO 30 Sec, Mild Sway  Condition 4: Foam Surface, EC 13.44 Sec, Severe Sway                                                                                                                              TREATMENT DATE: 12/10/2023    PATIENT EDUCATION: Education details: Eval results, POC; continue current exercises that patient was doing at home for standing VOR Person educated: Patient and Spouse Education method: Explanation Education comprehension: verbalized understanding  HOME EXERCISE PROGRAM:  GOALS: Goals reviewed with patient? Yes  SHORT TERM GOALS: Target date: 01/10/2024  Pt will be independent with HEP for improved balance, gait. Baseline: Goal status: MET  2.  Pt will improve gait velocity to at least 1.8 ft/sec for  improved gait efficiency and safety. Baseline: 1.65 ft/sec; 2.7 ft/sec w/ cane 3.2 ft/sec w/out cane  Goal status: MET (01/09/24)  3.  Pt will perform Condition 4 on MCTSIB for 30 sec with mod sway for improved overall balance. Baseline: 13.34 sec; 30 sec mild Goal status: MET (01/09/24)  LONG TERM GOALS: Target date: 02/20/2024  Pt will be independent with advanced HEP for improved balance, gait. Baseline:  Goal status: IN PROGRESS 01/23/24  2.  Pt will improve gait velocity to at least 2.3 ft/sec for improved gait efficiency and safety. Baseline: 1.65 ft/sec; 01/23/24;  (2.6 ft/sec) 01/23/24 Goal status: MET 01/23/24  3.  Pt will improve MCTSIB Condition 4 to mild sway for 30 seconds for improved overall balance. Baseline: 13.34 sec; 7 sec before LOB 01/23/24  Goal status: IN PROGRESS 01/23/24   4.  FGA to be assessed and pt to improve to at least 22/30 for decreased fall risk. Baseline: 14/30 12/16/23; 19/30 01/23/24 Goal status: IN PROGRESS 01/23/24  5.  Pt will ambulate at least 1000 ft, outdoor surfaces, least restrictive device, mod I, for return to walking his [place] and hiking activities. Baseline: Mild instability with cane, moderate instability without 01/23/24 Goal status: IN PROGRESS 01/23/24  6.  DHI score to improve to 10 or less for decreased dizziness impacting function. Baseline: 28; 4/100 01/23/24 Goal status: MET 01/23/24  7.  Patient to report ability to perform barn activities like lifting from the ground and carrying heavy items.  Baseline: unable  Goal status: INITIAL 01/23/24   ASSESSMENT:  CLINICAL IMPRESSION: Dynamic gait activities to improve reactive balance requiring BUE support for treadmill activities and notes cramping/burning to right calf and pt suspects vascular claudication.  Static multisensory balance activities to improve postural awareness with ability to maintain mild-moderate sway eyes closed compliant surface.  Difficulty with single limb support  for such tasks as stepping over obstacles needing UE support 25% of the time. Pt notes significant improvements in gaze stability with ability to ambulate outdoors with decreased oscillopsia and improved balance.  Continued sessions to progress POC details to improve safety with ambulation and reduce risk fro falls  OBJECTIVE IMPAIRMENTS: Abnormal gait, decreased balance, decreased mobility, difficulty walking, and impaired vision/preception.   ACTIVITY LIMITATIONS: reach over head, locomotion level, and caring for others  PARTICIPATION LIMITATIONS: meal prep, cleaning, laundry, driving, shopping, community activity, yard work, and hiking, horseback riding  PERSONAL FACTORS: 3+ comorbidities: see above PMH and recent medical hx are also affecting patient's functional outcome.   REHAB POTENTIAL: Good  CLINICAL DECISION MAKING: Evolving/moderate complexity  EVALUATION COMPLEXITY: Moderate   PLAN:  PT FREQUENCY: 1x/week  PT DURATION: 4 weeks   PLANNED INTERVENTIONS: 97750- Physical Performance Testing, 97110-Therapeutic exercises, 97530- Therapeutic activity, 97112- Neuromuscular re-education, 97535- Self Care, [id number]- Manual therapy, [phone]- Gait training, Patient/Family education, Balance training, Stair training, and Vestibular training  PLAN FOR NEXT SESSION: work on heavy lifting from the ground and heavy carrying to simulate barn activities; work towards riding his horse? work towards stair negotiation without handrail; VOR,compliant surface balance, walking/carrying on grass    3:34 PM, 02/06/24 M. Kelly Jerell Demery, PT, DPT Physical Therapist- [place] Office Number: [phone]

## 2024-02-11 ENCOUNTER — Encounter: Payer: Self-pay | Admitting: Physical Therapy

## 2024-02-11 ENCOUNTER — Ambulatory Visit: Admitting: Physical Therapy

## 2024-02-11 DIAGNOSIS — R42 Dizziness and giddiness: Secondary | ICD-10-CM | POA: Diagnosis not present

## 2024-02-11 DIAGNOSIS — R2689 Other abnormalities of gait and mobility: Secondary | ICD-10-CM | POA: Insufficient documentation

## 2024-02-11 DIAGNOSIS — R2681 Unsteadiness on feet: Secondary | ICD-10-CM | POA: Insufficient documentation

## 2024-02-11 NOTE — Therapy (Signed)
 OUTPATIENT PHYSICAL THERAPY VESTIBULAR TREATMENT NOTE     Patient Name: Edward Marquez MRN: [id number] DOB:[birth year], 73 y.o., male Today's Date: 02/11/2024    END OF SESSION:  PT End of Session - 02/11/24 1617     Visit Number 12    Number of Visits 13    Date for PT Re-Evaluation 02/20/24    Authorization Type [employer] Medicare    Authorization Time Period approved 4 PT visits from 02/05/24-03/04/24    Authorization - Visit Number 2    Authorization - Number of Visits 4    Progress Note Due on Visit 19    PT Start Time 1616    PT Stop Time 1658    PT Time Calculation (min) 42 min    Equipment Utilized During Treatment Gait belt   gait belt needed due to imbalance   Activity Tolerance Patient tolerated treatment well    Behavior During Therapy WFL for tasks assessed/performed                Past Medical History:  Diagnosis Date   Aortic valve defect    Atrial fibrillation (HCC)    CHF (congestive heart failure) (HCC)    Clotting disorder (HCC)    Gout    Hypertension    Peripheral vascular disease (HCC)    Past Surgical History:  Procedure Laterality Date   ABDOMINAL AORTOGRAM W/LOWER EXTREMITY N/A 05/03/2017   Procedure: ABDOMINAL AORTOGRAM W/LOWER EXTREMITY;  Surgeon: Eliza Lonni RAMAN, MD;  Location: [facility] INVASIVE CV LAB;  Service: Cardiovascular;  Laterality: N/A;   AORTIC VALVE REPLACEMENT     BYPASS GRAFT POPLITEAL TO TIBIAL Left 05/03/2017   Procedure: LEFT POPLITEAL TO ANTERIOR TIBIAL ARTERY BYPASS WITH VEIN;  Surgeon: Eliza Lonni RAMAN, MD;  Location: [facility] OR;  Service: Vascular;  Laterality: Left;   LOWER EXTREMITY ANGIOGRAPHY N/A 06/24/2017   Procedure: LOWER EXTREMITY ANGIOGRAPHY - Right;  Surgeon: Eliza Lonni RAMAN, MD;  Location: [facility] INVASIVE CV LAB;  Service: Cardiovascular;  Laterality: N/A;   PACEMAKER INSERTION     PERIPHERAL VASCULAR BALLOON ANGIOPLASTY  05/03/2017   Procedure: PERIPHERAL VASCULAR BALLOON ANGIOPLASTY;  Surgeon: Eliza Lonni RAMAN, MD;  Location: [facility] INVASIVE CV LAB;  Service: Cardiovascular;;   PERIPHERAL VASCULAR BALLOON ANGIOPLASTY Right 06/24/2017   Procedure: PERIPHERAL VASCULAR BALLOON ANGIOPLASTY;  Surgeon: Eliza Lonni RAMAN, MD;  Location: [facility] INVASIVE CV LAB;  Service: Cardiovascular;  Laterality: Right;  ant-tibial / perneal   VEIN HARVEST Left 05/03/2017   Procedure: POPITEAL VEIN HARVEST;  Surgeon: Eliza Lonni RAMAN, MD;  Location: [facility] OR;  Service: Vascular;  Laterality: Left;   Patient Active Problem List   Diagnosis Date Noted   Acute endocarditis 09/02/2023   Bacterial endocarditis 09/02/2023   Streptococcal bacteremia 09/02/2023   Positive blood culture 09/01/2023   Stage 3a chronic kidney disease (CKD) (HCC) 09/01/2023   CAP (community acquired pneumonia) 08/31/2023   Positive colorectal cancer screening using Cologuard test 08/29/2022   Hyperlipidemia LDL goal <70 07/31/2022   Hammer toes, bilateral 07/31/2022   Effusion, left knee 03/14/2022   Atrial fibrillation (HCC) 08/14/2017   History of implantable cardioverter-defibrillator (ICD) placement 08/14/2017   PAD (peripheral artery disease) (HCC) 05/03/2017   PVD (peripheral vascular disease) (HCC) 04/30/2017    PCP: Jhon Elveria LABOR, MD  REFERRING PROVIDER: Jhon Elveria LABOR, MD   REFERRING DIAG:  [phone] (ICD-10-CM) - Personal history of transient ischemic attack (TIA), and cerebral infarction without residual deficits  R42 (ICD-10-CM) - Dizziness and giddiness  THERAPY DIAG:  Unsteadiness on feet  Other abnormalities of gait and mobility  Dizziness and giddiness  ONSET DATE: 12/05/2023  Rationale for Evaluation and Treatment: Rehabilitation  SUBJECTIVE:   SUBJECTIVE STATEMENT: I've made such a great amount of progress.    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  PMH includesAAA, A-fib, Cardiomyopathy, CHF, hx of aortic valve replacement, HTN, HLD, ICD, PVD, CVA, tinnitus  PAIN:  Are you having pain? No  PRECAUTIONS: Fall and Other: Avoid lifting 20#    Has ICD; pt did have sternal precautions, but he reports they have been lifted. RED FLAGS: None   WEIGHT BEARING RESTRICTIONS: No  FALLS: Has patient fallen in last 6 months? Yes. Number of falls 1  LIVING ENVIRONMENT: Lives with: lives with their spouse Lives in: House/apartment Stairs: 5 steps to enter front; 12 steps into second floor (bedroom); rails Has following equipment at home: Single point cane and Walker - 2 wheeled  PLOF: Independent  PATIENT GOALS: To get back to normal, prior level of activities  OBJECTIVE:    TODAY'S TREATMENT: 02/11/2024 Activity Comments  Forward/back walking x 3 min   Tandem gait forward 4 reps Mod-severe sway with min assist  Tandem stance, 3 x 30 sec, EO and EC Light UE support  Rockerboard -lateral -ant/posterior Cues for use of hip strategy to help control laterally  Step up/up, down/down to 6 step 10 reps, then step up/up, down/up 6 step 5 reps with lifting red weighted ball Decreased eccentric control returning to the ground, when holding ball  Step up/up, down/down from Airex Wide BOS upon returning to ground  Standing on Airex:  forward/back stepping, then side stepping Minisquat to up on toes x 10 Light UE support   HOME EXERCISE PROGRAM Access Code: [email or web address] URL: [email or web address] Date: 02/11/2024 Prepared by: [facility] - Outpatient  Rehab - [facility]  Program Notes perform in a corner with a chair in front for safety**Stand on cushion near a counter:  Step forward>back 10 reps with one foot, then repeat with the other footThen step out to the side/return to cushion, alternating feet 10 reps  Exercises - Standing Gaze Stabilization with Head Rotation  - 1  x daily - 5 x weekly - 2-3 sets - 30 sec hold - Standing Gaze Stabilization with Head Nod  - 1 x daily - 5 x weekly - 2-3 sets - 30 sec hold - Standing March with Counter Support  - 1 x daily - 5 x weekly - 2 sets - 20 reps - Standing Balance with Eyes Closed on Foam  - 1 x daily - 5 x weekly - 2 sets - 30 sec  hold - Standing Tandem Balance with Counter Support  - 1 x daily - 5 x weekly - 1 sets - 3-5 reps - 30 sec hold          PATIENT EDUCATION: Education details: Updates to HEP Person educated: Patient Education method: Programmer, multimedia, Demonstration, Verbal cues, and Handouts Education comprehension: verbalized understanding, returned demonstration, and needs further education       Note: Objective measures were completed at Evaluation unless otherwise noted.  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   SYMPTOM BEHAVIOR:  Subjective history: See above-wife provided most of hx.  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  Since the CVA, he has had visual disturbances with standing and walking-more vertical motion of eyes and blurred vision.  Non-Vestibular symptoms: tinnitus  Type of dizziness: Imbalance (Disequilibrium), Oscillopsia, and Unsteady with  head/body turns  Frequency: throughout the day   Duration: more as he gets tired  Aggravating factors: Induced by motion: looking up at the ceiling, turning body quickly, and turning head quickly and Worse with fatigue  Relieving factors: slow movements  Progression of symptoms: better  OCULOMOTOR EXAM:  Ocular Alignment: normal  Ocular ROM: No Limitations  Spontaneous Nystagmus: absent  Gaze-Induced Nystagmus: right beating with right gaze and left beating with left gaze  Smooth Pursuits: intact  Saccades: intact  Convergence/Divergence: 2-3 cm    VESTIBULAR - OCULAR REFLEX:   Slow VOR: Positive Bilaterally horizontal and vertical; blurriness  VOR Cancellation: Comment: no symptoms horizontal; would incr symptoms vertical  Head-Impulse Test: HIT Right: slowed HIT Left: slowed      MOTION SENSITIVITY: *NOT TESTED AT EVAL*  Motion Sensitivity Quotient Intensity: 0 = none, 1 = Lightheaded, 2 = Mild, 3 = Moderate, 4 = Severe, 5 = Vomiting  Intensity  1. Sitting to supine   2. Supine to L side   3. Supine to R side   4. Supine to sitting   5. L Hallpike-Dix   6. Up from L    7. R Hallpike-Dix   8. Up from R    9. Sitting, head tipped to L knee   10. Head up from L knee   11. Sitting, head tipped to R knee   12. Head up from R knee   13. Sitting head turns x5   14.Sitting head nods x5   15. In stance, 180 turn to L    16. In stance, 180 turn to R      FUNCTIONAL GAIT: 10 meter walk test: 19.91 sec = 1.65 ft/sec   M-CTSIB  Condition 1: Firm Surface, EO 30 Sec, Normal Sway  Condition 2: Firm Surface, EC 30 Sec, Mild Sway  Condition 3: Foam Surface, EO 30 Sec, Mild Sway  Condition 4: Foam Surface, EC 13.44 Sec, Severe Sway  TREATMENT DATE: 12/10/2023    PATIENT EDUCATION: Education details: Eval results, POC; continue current exercises that  patient was doing at home for standing VOR Person educated: Patient and Spouse Education method: Explanation Education comprehension: verbalized understanding  HOME EXERCISE PROGRAM:  GOALS: Goals reviewed with patient? Yes  SHORT TERM GOALS: Target date: 01/10/2024  Pt will be independent with HEP for improved balance, gait. Baseline: Goal status: MET  2.  Pt will improve gait velocity to at least 1.8 ft/sec for improved gait efficiency and safety. Baseline: 1.65 ft/sec; 2.7 ft/sec w/ cane 3.2 ft/sec w/out cane Goal status: MET (01/09/24)  3.  Pt will perform Condition 4 on MCTSIB for 30 sec with mod sway for improved overall balance. Baseline: 13.34 sec; 30 sec mild Goal status: MET (01/09/24)  LONG TERM GOALS: Target date: 02/20/2024  Pt will be independent with advanced HEP for improved balance, gait. Baseline:  Goal status: IN PROGRESS 01/23/24  2.  Pt will improve gait velocity to at least 2.3 ft/sec for improved gait efficiency and safety. Baseline: 1.65 ft/sec; 01/23/24;  (2.6 ft/sec) 01/23/24 Goal status: MET 01/23/24  3.  Pt will improve MCTSIB Condition 4 to mild sway for 30 seconds for improved overall balance. Baseline: 13.34 sec; 7 sec before LOB 01/23/24  Goal status: IN PROGRESS 01/23/24   4.  FGA to be assessed and pt to improve to at least 22/30 for decreased fall risk. Baseline: 14/30 12/16/23; 19/30 01/23/24 Goal status: IN PROGRESS 01/23/24  5.  Pt will ambulate at least 1000 ft, outdoor surfaces, least restrictive device, mod I, for return to walking his [place] and hiking activities. Baseline: Mild instability with cane, moderate instability without 01/23/24 Goal status: IN PROGRESS 01/23/24  6.  DHI score to improve to 10 or less for decreased dizziness impacting function. Baseline: 28; 4/100 01/23/24 Goal status: MET 01/23/24  7.  Patient to report ability to perform barn activities like lifting from the ground and carrying heavy items.  Baseline: unable   Goal status: INITIAL 01/23/24   ASSESSMENT:  CLINICAL IMPRESSION: Pt presents today with no new complaints, continuing to report overall improvements. Skilled PT session focused on dynamic balance and compliant surface activities.  He is challenged by tandem gait and tandem stance, as well as transitions on/off step and compliant surface.  He continues to improve with balance, but compliant surfaces and narrowed BOS are difficult.  Pt will continue to benefit from skilled PT towards goals for improved functional mobility and decreased fall risk.   OBJECTIVE IMPAIRMENTS: Abnormal gait, decreased balance, decreased mobility, difficulty walking, and impaired vision/preception.   ACTIVITY LIMITATIONS: reach over head, locomotion level, and caring for others  PARTICIPATION LIMITATIONS: meal prep, cleaning, laundry, driving, shopping, community activity, yard work, and hiking, horseback riding  PERSONAL FACTORS: 3+ comorbidities: see above PMH and recent medical hx are also affecting patient's functional outcome.   REHAB POTENTIAL: Good  CLINICAL DECISION MAKING: Evolving/moderate complexity  EVALUATION COMPLEXITY: Moderate   PLAN:  PT FREQUENCY: 1x/week  PT DURATION: 4 weeks   PLANNED INTERVENTIONS: 97750- Physical Performance Testing, 97110-Therapeutic exercises, 97530- Therapeutic activity, 97112- Neuromuscular re-education, 97535- Self Care, [id number]- Manual therapy, [phone]- Gait training, Patient/Family education, Balance training, Stair training, and Vestibular training  PLAN FOR NEXT SESSION: Check LTGs and discuss POC; review additions to HEP  Greig Anon, PT 02/11/24 5:07 PM Phone: [phone] Fax: [phone]  [facility] at [facility] [address] [place], [place] [address] Phone # [phone]  Fax # [phone]

## 2024-02-20 NOTE — Therapy (Signed)
 OUTPATIENT PHYSICAL THERAPY VESTIBULAR RE-CERT     Patient Name: Edward Marquez MRN: [id number] DOB:07-16-1951, 73 y.o., male Today's Date: 02/21/2024     END OF SESSION:  PT End of Session - 02/21/24 0926     Visit Number 13    Number of Visits 17    Date for PT Re-Evaluation 03/20/24    Authorization Type [employer] Medicare    Authorization Time Period approved 4 PT visits from 02/05/24-03/04/24    Authorization - Visit Number 3    Authorization - Number of Visits 4    Progress Note Due on Visit 19    PT Start Time 0848    PT Stop Time 0929    PT Time Calculation (min) 41 min    Equipment Utilized During Treatment Gait belt   gait belt needed due to imbalance   Activity Tolerance Patient tolerated treatment well    Behavior During Therapy WFL for tasks assessed/performed                 Past Medical History:  Diagnosis Date   Aortic valve defect    Atrial fibrillation (HCC)    CHF (congestive heart failure) (HCC)    Clotting disorder (HCC)    Gout    Hypertension    Peripheral vascular disease (HCC)    Past Surgical History:  Procedure Laterality Date   ABDOMINAL AORTOGRAM W/LOWER EXTREMITY N/A 05/03/2017   Procedure: ABDOMINAL AORTOGRAM W/LOWER EXTREMITY;  Surgeon: Eliza Lonni RAMAN, MD;  Location: [facility] INVASIVE CV LAB;  Service: Cardiovascular;  Laterality: N/A;   AORTIC VALVE REPLACEMENT     BYPASS GRAFT POPLITEAL TO TIBIAL Left 05/03/2017   Procedure: LEFT POPLITEAL TO ANTERIOR TIBIAL ARTERY BYPASS WITH VEIN;  Surgeon: Eliza Lonni RAMAN, MD;  Location: [facility] OR;  Service: Vascular;  Laterality: Left;   LOWER EXTREMITY ANGIOGRAPHY N/A 06/24/2017   Procedure: LOWER EXTREMITY ANGIOGRAPHY - Right;  Surgeon: Eliza Lonni RAMAN, MD;  Location: [facility] INVASIVE CV LAB;  Service: Cardiovascular;  Laterality: N/A;   PACEMAKER INSERTION     PERIPHERAL VASCULAR BALLOON ANGIOPLASTY  05/03/2017   Procedure: PERIPHERAL VASCULAR BALLOON ANGIOPLASTY;  Surgeon: Eliza Lonni RAMAN, MD;  Location: [facility] INVASIVE CV LAB;  Service: Cardiovascular;;   PERIPHERAL VASCULAR BALLOON ANGIOPLASTY Right 06/24/2017   Procedure: PERIPHERAL VASCULAR BALLOON ANGIOPLASTY;  Surgeon: Eliza Lonni RAMAN, MD;  Location: [facility] INVASIVE CV LAB;  Service: Cardiovascular;  Laterality: Right;  ant-tibial / perneal   VEIN HARVEST Left 05/03/2017   Procedure: POPITEAL VEIN HARVEST;  Surgeon: Eliza Lonni RAMAN, MD;  Location: [facility] OR;  Service: Vascular;  Laterality: Left;   Patient Active Problem List   Diagnosis Date Noted   Acute endocarditis 09/02/2023   Bacterial endocarditis 09/02/2023   Streptococcal bacteremia 09/02/2023   Positive blood culture 09/01/2023   Stage 3a chronic kidney disease (CKD) (HCC) 09/01/2023   CAP (community acquired pneumonia) 08/31/2023   Positive colorectal cancer screening using Cologuard test 08/29/2022   Hyperlipidemia LDL goal <70 07/31/2022   Hammer toes, bilateral 07/31/2022   Effusion, left knee 03/14/2022   Atrial fibrillation (HCC) 08/14/2017   History of implantable cardioverter-defibrillator (ICD) placement 08/14/2017   PAD (peripheral artery disease) (HCC) 05/03/2017   PVD (peripheral vascular disease) (HCC) 04/30/2017    PCP: Jhon Elveria LABOR, MD  REFERRING PROVIDER: Jhon Elveria LABOR, MD   REFERRING DIAG:  [phone] (ICD-10-CM) - Personal history of transient ischemic attack (TIA), and cerebral infarction without residual deficits  R42 (ICD-10-CM) - Dizziness and giddiness  THERAPY DIAG:  Unsteadiness on feet  Other abnormalities of gait and mobility  Dizziness and giddiness  ONSET DATE: 12/05/2023  Rationale for Evaluation and Treatment: Rehabilitation  SUBJECTIVE:   SUBJECTIVE STATEMENT: It's been a bad week. I had a fall- stepped on some gravel in the driveway in the dark. Reports that he scraped his L knee up. That shook me up. Reports that vision continues to improve. Reports burning in his LEs when walking ~  10 minutes. Has an appointment with his MD coming up to discuss this.    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  PMH includesAAA, A-fib, Cardiomyopathy, CHF, hx of aortic valve replacement, HTN, HLD, ICD, PVD, CVA, tinnitus  PAIN:  Are you having pain? No  PRECAUTIONS: Fall and Other: Avoid lifting 20#    Has ICD; pt did have sternal precautions, but he reports they have been lifted. RED FLAGS: None   WEIGHT BEARING RESTRICTIONS: No  FALLS: Has patient fallen in last 6 months? Yes. Number of falls 1  LIVING ENVIRONMENT: Lives with: lives with their spouse Lives in: House/apartment Stairs: 5 steps to enter front; 12 steps into second floor (bedroom); rails Has following equipment at home: Single point cane and Walker - 2 wheeled  PLOF: Independent  PATIENT GOALS: To get back to normal, prior level of activities  OBJECTIVE:      TODAY'S TREATMENT: 02/21/24 Activity Comments  MCTSIB #3, #4 30 sec mild-mod sway, 2 sec with LOB  FGA 19/30  Gait training outside on grass, hills, sidewalk 0.13 miles  No LOB with walking stick              [facility] PT Assessment - 02/21/24 0001       Functional Gait  Assessment   Gait Level Surface Walks 20 ft in less than 7 sec but greater than 5.5 sec, uses assistive device, slower speed, mild gait deviations, or deviates 6-10 in outside of the 12 in walkway width.    Change in Gait Speed Able to change speed, demonstrates mild gait deviations, deviates 6-10 in outside of the 12 in walkway width, or no gait deviations, unable to achieve a major change in velocity, or uses a change in velocity, or uses an assistive device.    Gait with Horizontal Head Turns Performs head turns smoothly with slight change in gait velocity (eg, minor disruption to smooth  gait path), deviates 6-10 in outside 12 in walkway width, or uses an assistive device.    Gait with Vertical Head Turns Performs head turns with no change in gait. Deviates no more than 6 in outside 12 in walkway width.    Gait and Pivot Turn Pivot turns safely within 3 sec and stops quickly with no loss of balance.    Step Over Obstacle Is able to step over 2 stacked shoe boxes taped together (9 in total height) without changing gait speed. No evidence of imbalance.    Gait with Narrow Base of Support Ambulates less than 4 steps heel to toe or cannot perform without assistance.    Gait with Eyes Closed Walks 20 ft, slow speed, abnormal gait pattern, evidence for imbalance, deviates 10-15 in outside 12 in walkway width. Requires more than 9 sec to ambulate 20 ft.    Ambulating Backwards Walks 20 ft, slow speed, abnormal gait pattern,  evidence for imbalance, deviates 10-15 in outside 12 in walkway width.    Steps Alternating feet, must use rail.    Total Score 19           PATIENT EDUCATION: Education details: discussed pt's fall and educated on compensatory strategies with EC/compliant surface, discussed benefits of LE strengthening to address strength and stamina- HEP updated with these exercises, recert/POC Person educated: Patient Education method: Explanation, Demonstration, Tactile cues, Verbal cues, and Handouts Education comprehension: verbalized understanding and returned demonstration    HOME EXERCISE PROGRAM Access Code: [email or web address] URL: [email or web address] Date: 02/21/2024 Prepared by: [facility] - Outpatient  Rehab - [facility]  Program Notes perform in a corner with a chair in front for safety**Stand on cushion near a counter:  Step forward>back 10 reps with one foot, then repeat with the other footThen step out to the side/return to cushion, alternating feet 10 reps  Exercises - Standing Gaze Stabilization with Head Rotation  - 1 x daily - 5 x weekly -  2-3 sets - 30 sec hold - Standing Gaze Stabilization with Head Nod  - 1 x daily - 5 x weekly - 2-3 sets - 30 sec hold - Standing March with Counter Support  - 1 x daily - 5 x weekly - 2 sets - 20 reps - Standing Balance with Eyes Closed on Foam  - 1 x daily - 5 x weekly - 2 sets - 30 sec  hold - Standing Tandem Balance with Counter Support  - 1 x daily - 5 x weekly - 1 sets - 3-5 reps - 30 sec hold - Seated Long Arc Quad  - 1 x daily - 5 x weekly - 2 sets - 10 reps - Standing Hip Abduction with Counter Support  - 1 x daily - 5 x weekly - 2 sets - 10 reps - Standing Alternating Knee Flexion with Ankle Weights  - 1 x daily - 5 x weekly - 2 sets - 10 reps        Note: Objective measures were completed at Evaluation unless otherwise noted.  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   SYMPTOM BEHAVIOR:  Subjective history: See above-wife provided most of hx.  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  Since the CVA, he has had visual disturbances with standing and walking-more vertical motion of eyes and blurred vision.  Non-Vestibular symptoms: tinnitus  Type of dizziness: Imbalance (Disequilibrium), Oscillopsia, and Unsteady with head/body turns  Frequency:  throughout the day   Duration: more as he gets tired  Aggravating factors: Induced by motion: looking up at the ceiling, turning body quickly, and turning head quickly and Worse with fatigue  Relieving factors: slow movements  Progression of symptoms: better  OCULOMOTOR EXAM:  Ocular Alignment: normal  Ocular ROM: No Limitations  Spontaneous Nystagmus: absent  Gaze-Induced Nystagmus: right beating with right gaze and left beating with left gaze  Smooth Pursuits: intact  Saccades: intact  Convergence/Divergence: 2-3 cm    VESTIBULAR - OCULAR REFLEX:   Slow VOR: Positive Bilaterally horizontal and vertical; blurriness  VOR Cancellation: Comment: no symptoms horizontal; would incr symptoms vertical  Head-Impulse Test: HIT Right: slowed HIT Left: slowed      MOTION SENSITIVITY: *NOT TESTED AT EVAL*  Motion Sensitivity Quotient Intensity: 0 = none, 1 = Lightheaded, 2 = Mild, 3 = Moderate, 4 = Severe, 5 = Vomiting  Intensity  1. Sitting to supine   2. Supine to L side   3. Supine to R side   4. Supine to sitting   5. L Hallpike-Dix   6. Up from L    7. R Hallpike-Dix   8. Up from R    9. Sitting, head tipped to L knee   10. Head up from L knee   11. Sitting, head tipped to R knee   12. Head up from R knee   13. Sitting head turns x5   14.Sitting head nods x5   15. In stance, 180 turn to L    16. In stance, 180 turn to R      FUNCTIONAL GAIT: 10 meter walk test: 19.91 sec = 1.65 ft/sec   M-CTSIB  Condition 1: Firm Surface, EO 30 Sec, Normal Sway  Condition 2: Firm Surface, EC 30 Sec, Mild Sway  Condition 3: Foam Surface, EO 30 Sec, Mild Sway  Condition 4: Foam Surface, EC 13.44 Sec, Severe Sway                                                                                                                              TREATMENT DATE: 12/10/2023    PATIENT EDUCATION: Education details: Eval results, POC; continue current exercises that patient was doing at home  for standing VOR Person educated: Patient and Spouse Education method: Explanation Education comprehension: verbalized understanding  HOME EXERCISE PROGRAM:  GOALS: Goals reviewed with patient? Yes  SHORT TERM GOALS: Target date: 01/10/2024  Pt will be independent with HEP for improved balance, gait. Baseline: Goal status: MET  2.  Pt will improve gait velocity to at least 1.8 ft/sec for improved gait efficiency and safety. Baseline: 1.65 ft/sec; 2.7 ft/sec w/ cane 3.2 ft/sec w/out cane Goal status: MET (01/09/24)  3.  Pt will perform Condition 4 on MCTSIB for 30 sec with mod sway for improved overall balance. Baseline: 13.34 sec; 30 sec mild Goal status: MET (01/09/24)  LONG TERM GOALS: Target date: 03/20/2024  Pt will be independent with advanced HEP for improved balance, gait. Baseline: update 02/21/24 Goal status: IN PROGRESS 02/21/24  2.  Pt will improve gait velocity to at least 2.3 ft/sec for improved gait efficiency and safety. Baseline: 1.65 ft/sec; 01/23/24;  (2.6 ft/sec) 01/23/24 Goal status: MET 01/23/24  3.  Pt will improve MCTSIB Condition 4 to mild  sway for 30 seconds for improved overall balance. Baseline: 13.34 sec; 7 sec before LOB 01/23/24; 2 sec with LOB 02/21/24 Goal status: IN PROGRESS 02/21/24  4.  FGA to be assessed and pt to improve to at least 22/30 for decreased fall risk. Baseline: 14/30 12/16/23; 19/30 01/23/24; 19 02/21/24 Goal status: IN PROGRESS 02/21/24  5.  Pt will ambulate at least 1000 ft, outdoor surfaces, least restrictive device, mod I, for return to walking his [place] and hiking activities. Baseline: Mild instability with cane, moderate instability without 01/23/24; no LOB with walking stick on grass 02/21/24 Goal status: IN PROGRESS 02/21/24  6.  DHI score to improve to 10 or less for decreased dizziness impacting function. Baseline: 28; 4/100 01/23/24 Goal status: MET 01/23/24  7.  Patient to report ability to perform barn activities like lifting  from the ground and carrying heavy items.  Baseline: unable; pt reports improved ability for lifting horse feed 02/21/24 Goal status: IN PROGRESS 02/21/24  ASSESSMENT:  CLINICAL IMPRESSION: Patient arrived to session with report of a fall since last session, resulting in L knee abrasion. Balance testing revealed remaining imbalance/LOB with multisensory testing and unchanged FGA. However, patient without LOB with gait training on outdoor surfaces and reports improved ability for heavy lifting. Discussed compensatory strategies to improve safety in the dark/on uneven surfaces. Patient reported understanding of all edu provided today., patient is demonstrating steady progress towards goals. Would benefit form additional skilled PT services 1x/week for 4 weeks to address remaining impairments.   OBJECTIVE IMPAIRMENTS: Abnormal gait, decreased balance, decreased mobility, difficulty walking, and impaired vision/preception.   ACTIVITY LIMITATIONS: reach over head, locomotion level, and caring for others  PARTICIPATION LIMITATIONS: meal prep, cleaning, laundry, driving, shopping, community activity, yard work, and hiking, horseback riding  PERSONAL FACTORS: 3+ comorbidities: see above PMH and recent medical hx are also affecting patient's functional outcome.   REHAB POTENTIAL: Good  CLINICAL DECISION MAKING: Evolving/moderate complexity  EVALUATION COMPLEXITY: Moderate   PLAN:  PT FREQUENCY: 1x/week  PT DURATION: 4 weeks   PLANNED INTERVENTIONS: 97750- Physical Performance Testing, 97110-Therapeutic exercises, 97530- Therapeutic activity, 97112- Neuromuscular re-education, 97535- Self Care, [id number]- Manual therapy, [phone]- Gait training, Patient/Family education, Balance training, Stair training, and Vestibular training  PLAN FOR NEXT SESSION: review additions to HEP, compliant surface training, lifting/carrying   Louana Terrilyn Christians, PT, DPT 02/21/24 10:56 AM  [facility] [address] [place], [place] [address] Phone # [phone] Fax # [phone]

## 2024-02-21 ENCOUNTER — Encounter: Payer: Self-pay | Admitting: Physical Therapy

## 2024-02-21 ENCOUNTER — Ambulatory Visit: Admitting: Physical Therapy

## 2024-02-21 DIAGNOSIS — R2689 Other abnormalities of gait and mobility: Secondary | ICD-10-CM

## 2024-02-21 DIAGNOSIS — R2681 Unsteadiness on feet: Secondary | ICD-10-CM | POA: Diagnosis not present

## 2024-02-21 DIAGNOSIS — R42 Dizziness and giddiness: Secondary | ICD-10-CM

## 2024-02-27 NOTE — Therapy (Signed)
OUTPATIENT PHYSICAL THERAPY VESTIBULAR NOTE     Patient Name: Edward Marquez MRN: [id number] DOB:03-07-1951, 73 y.o., male Today's Date: 02/28/2024     END OF SESSION:  PT End of Session - 02/28/24 0922     Visit Number 14    Number of Visits 17    Date for PT Re-Evaluation 03/20/24    Authorization Type [employer] Medicare    Authorization Time Period approved 4 PT visits from 02/28/2024 - 04/03/2024    Authorization - Visit Number 1    Authorization - Number of Visits 4    Progress Note Due on Visit 19    PT Start Time 0848    PT Stop Time 0930    PT Time Calculation (min) 42 min    Equipment Utilized During Treatment Gait belt   gait belt needed due to imbalance   Activity Tolerance Patient tolerated treatment well    Behavior During Therapy WFL for tasks assessed/performed                  Past Medical History:  Diagnosis Date   Aortic valve defect    Atrial fibrillation (HCC)    CHF (congestive heart failure) (HCC)    Clotting disorder (HCC)    Gout    Hypertension    Peripheral vascular disease (HCC)    Past Surgical History:  Procedure Laterality Date   ABDOMINAL AORTOGRAM W/LOWER EXTREMITY N/A 05/03/2017   Procedure: ABDOMINAL AORTOGRAM W/LOWER EXTREMITY;  Surgeon: Eliza Lonni RAMAN, MD;  Location: [facility] INVASIVE CV LAB;  Service: Cardiovascular;  Laterality: N/A;   AORTIC VALVE REPLACEMENT     BYPASS GRAFT POPLITEAL TO TIBIAL Left 05/03/2017   Procedure: LEFT POPLITEAL TO ANTERIOR TIBIAL ARTERY BYPASS WITH VEIN;  Surgeon: Eliza Lonni RAMAN, MD;  Location: [facility] OR;  Service: Vascular;  Laterality: Left;   LOWER EXTREMITY ANGIOGRAPHY N/A 06/24/2017   Procedure: LOWER EXTREMITY ANGIOGRAPHY - Right;  Surgeon: Eliza Lonni RAMAN, MD;  Location: [facility] INVASIVE CV LAB;  Service: Cardiovascular;  Laterality: N/A;   PACEMAKER INSERTION     PERIPHERAL VASCULAR BALLOON ANGIOPLASTY  05/03/2017   Procedure: PERIPHERAL VASCULAR BALLOON ANGIOPLASTY;  Surgeon:  Eliza Lonni RAMAN, MD;  Location: [facility] INVASIVE CV LAB;  Service: Cardiovascular;;   PERIPHERAL VASCULAR BALLOON ANGIOPLASTY Right 06/24/2017   Procedure: PERIPHERAL VASCULAR BALLOON ANGIOPLASTY;  Surgeon: Eliza Lonni RAMAN, MD;  Location: [facility] INVASIVE CV LAB;  Service: Cardiovascular;  Laterality: Right;  ant-tibial / perneal   VEIN HARVEST Left 05/03/2017   Procedure: POPITEAL VEIN HARVEST;  Surgeon: Eliza Lonni RAMAN, MD;  Location: [facility] OR;  Service: Vascular;  Laterality: Left;   Patient Active Problem List   Diagnosis Date Noted   Acute endocarditis 09/02/2023   Bacterial endocarditis 09/02/2023   Streptococcal bacteremia 09/02/2023   Positive blood culture 09/01/2023   Stage 3a chronic kidney disease (CKD) (HCC) 09/01/2023   CAP (community acquired pneumonia) 08/31/2023   Positive colorectal cancer screening using Cologuard test 08/29/2022   Hyperlipidemia LDL goal <70 07/31/2022   Hammer toes, bilateral 07/31/2022   Effusion, left knee 03/14/2022   Atrial fibrillation (HCC) 08/14/2017   History of implantable cardioverter-defibrillator (ICD) placement 08/14/2017   PAD (peripheral artery disease) (HCC) 05/03/2017   PVD (peripheral vascular disease) (HCC) 04/30/2017    PCP: Jhon Elveria LABOR, MD  REFERRING PROVIDER: Jhon Elveria LABOR, MD   REFERRING DIAG:  [phone] (ICD-10-CM) - Personal history of transient ischemic attack (TIA), and cerebral infarction without residual deficits  R42 (ICD-10-CM) - Dizziness and  giddiness    THERAPY DIAG:  Unsteadiness on feet  Other abnormalities of gait and mobility  Dizziness and giddiness  ONSET DATE: 12/05/2023  Rationale for Evaluation and Treatment: Rehabilitation  SUBJECTIVE:   SUBJECTIVE STATEMENT: Overwhelming busy. Reports a lot going on. Denies recent falls. Got a small flashlight to keep with him if he is ever in the dark.    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  PMH includesAAA, A-fib, Cardiomyopathy, CHF, hx of aortic valve replacement, HTN, HLD, ICD, PVD, CVA, tinnitus  PAIN:  Are you having pain? No  PRECAUTIONS: Fall and Other: Avoid lifting 20#    Has ICD; pt did have sternal precautions, but he reports they have been lifted. RED FLAGS: None   WEIGHT BEARING RESTRICTIONS: No  FALLS: Has patient fallen in last 6 months? Yes. Number of falls 1  LIVING ENVIRONMENT: Lives with: lives with their spouse Lives in: House/apartment Stairs: 5 steps to enter front; 12 steps into second floor (bedroom); rails Has following equipment at home: Single point cane and Walker - 2 wheeled  PLOF: Independent  PATIENT GOALS: To get back to normal, prior level of activities  OBJECTIVE:     TODAY'S TREATMENT: 02/28/24 Activity Comments  High knee march suitcase carry with 15# 2 min each hand Wide BOS and limited control; CGA d/t instability. And cues for quiet feet  squat to stand with 15# 2x10 Alternating UEs; cues for deeper hip hinge   alt step ups 15# 2x10 Mild instability; cues to coordinate alternating stepping   step up + opposite SKTC  CGA-min A and weaned to 1 fingertip support; more balance challenge with this version   2 alt step ups in a row, followed by backwards  Cueing for alt reciprocal rather than step-to   Rockerboard ant/pos and R/L static and dynamic  Tendency for L LOB with UEs required for correction and occasional min A             HOME EXERCISE PROGRAM Access Code: [email or web address] URL: [email or web address] Date: 02/21/2024 Prepared by: [facility] - Outpatient  Rehab - [facility]  Program Notes perform in a corner with a chair in front for safety**Stand on cushion near a counter:  Step forward>back 10 reps with one foot, then repeat with the other  footThen step out to the side/return to cushion, alternating feet 10 reps  Exercises - Standing Gaze Stabilization with Head Rotation  - 1 x daily - 5 x weekly - 2-3 sets - 30 sec hold - Standing Gaze Stabilization with Head Nod  - 1 x daily - 5 x weekly - 2-3 sets - 30 sec hold - Standing March with Counter Support  - 1 x daily - 5 x weekly - 2 sets - 20 reps - Standing Balance with Eyes Closed on Foam  - 1 x daily - 5 x weekly - 2 sets - 30 sec  hold - Standing Tandem Balance with Counter Support  - 1 x daily - 5 x weekly - 1 sets - 3-5 reps - 30 sec hold - Seated Long Arc Quad  - 1 x daily - 5 x weekly - 2 sets - 10 reps - Standing Hip Abduction with Counter Support  - 1 x daily - 5 x weekly - 2 sets - 10 reps - Standing Alternating  Knee Flexion with Ankle Weights  - 1 x daily - 5 x weekly - 2 sets - 10 reps        Note: Objective measures were completed at Evaluation unless otherwise noted.  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   SYMPTOM BEHAVIOR:  Subjective history: See above-wife provided most of hx.  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  Since the CVA, he has had visual disturbances with standing and walking-more vertical motion of eyes and  blurred vision.  Non-Vestibular symptoms: tinnitus  Type of dizziness: Imbalance (Disequilibrium), Oscillopsia, and Unsteady with head/body turns  Frequency: throughout the day   Duration: more as he gets tired  Aggravating factors: Induced by motion: looking up at the ceiling, turning body quickly, and turning head quickly and Worse with fatigue  Relieving factors: slow movements  Progression of symptoms: better  OCULOMOTOR EXAM:  Ocular Alignment: normal  Ocular ROM: No Limitations  Spontaneous Nystagmus: absent  Gaze-Induced Nystagmus: right beating with right gaze and left beating with left gaze  Smooth Pursuits: intact  Saccades: intact  Convergence/Divergence: 2-3 cm    VESTIBULAR - OCULAR REFLEX:   Slow VOR: Positive Bilaterally horizontal and vertical; blurriness  VOR Cancellation: Comment: no symptoms horizontal; would incr symptoms vertical  Head-Impulse Test: HIT Right: slowed HIT Left: slowed      MOTION SENSITIVITY: *NOT TESTED AT EVAL*  Motion Sensitivity Quotient Intensity: 0 = none, 1 = Lightheaded, 2 = Mild, 3 = Moderate, 4 = Severe, 5 = Vomiting  Intensity  1. Sitting to supine   2. Supine to L side   3. Supine to R side   4. Supine to sitting   5. L Hallpike-Dix   6. Up from L    7. R Hallpike-Dix   8. Up from R    9. Sitting, head tipped to L knee   10. Head up from L knee   11. Sitting, head tipped to R knee   12. Head up from R knee   13. Sitting head turns x5   14.Sitting head nods x5   15. In stance, 180 turn to L    16. In stance, 180 turn to R      FUNCTIONAL GAIT: 10 meter walk test: 19.91 sec = 1.65 ft/sec   M-CTSIB  Condition 1: Firm Surface, EO 30 Sec, Normal Sway  Condition 2: Firm Surface, EC 30 Sec, Mild Sway  Condition 3: Foam Surface, EO 30 Sec, Mild Sway  Condition 4: Foam Surface, EC 13.44 Sec, Severe Sway  TREATMENT DATE: 12/10/2023    PATIENT EDUCATION: Education details: Eval results, POC; continue current exercises that patient was doing at home for standing VOR Person educated: Patient and Spouse Education method: Explanation Education comprehension: verbalized understanding  HOME EXERCISE PROGRAM:  GOALS: Goals reviewed with patient? Yes  SHORT TERM GOALS: Target date: 01/10/2024  Pt will be independent with HEP for improved balance, gait. Baseline: Goal status: MET  2.  Pt will improve gait velocity to at least 1.8 ft/sec for improved gait efficiency and safety. Baseline: 1.65 ft/sec; 2.7 ft/sec w/ cane 3.2 ft/sec w/out cane Goal status: MET (01/09/24)  3.  Pt will perform Condition 4 on MCTSIB for 30 sec with mod sway for improved overall balance. Baseline: 13.34 sec; 30 sec mild Goal status: MET (01/09/24)  LONG TERM GOALS: Target date: 03/20/2024  Pt will be independent with advanced HEP for improved balance, gait. Baseline: update 02/21/24 Goal status: IN PROGRESS 02/21/24  2.  Pt will improve gait velocity to at least 2.3 ft/sec for improved gait efficiency and safety. Baseline: 1.65 ft/sec; 01/23/24;  (2.6 ft/sec) 01/23/24 Goal status: MET 01/23/24  3.  Pt will improve MCTSIB Condition 4 to mild sway for 30 seconds for improved overall balance. Baseline: 13.34 sec; 7 sec before LOB 01/23/24; 2 sec with LOB 02/21/24 Goal status: IN PROGRESS 02/21/24  4.  FGA to be assessed and pt to improve to at least 22/30 for decreased fall risk. Baseline: 14/30 12/16/23; 19/30 01/23/24; 19 02/21/24 Goal status: IN PROGRESS 02/21/24  5.  Pt will ambulate at least 1000 ft, outdoor surfaces, least restrictive device, mod I, for return to walking his [place] and hiking activities. Baseline: Mild instability with cane, moderate instability without 01/23/24; no LOB with walking stick on grass 02/21/24 Goal status: IN PROGRESS 02/21/24  6.  DHI score to improve to 10 or less for decreased  dizziness impacting function. Baseline: 28; 4/100 01/23/24 Goal status: MET 01/23/24  7.  Patient to report ability to perform barn activities like lifting from the ground and carrying heavy items.  Baseline: unable; pt reports improved ability for lifting horse feed 02/21/24 Goal status: IN PROGRESS 02/21/24  ASSESSMENT:  CLINICAL IMPRESSION: Patient arrived to session without new complaints. Worked on maintaining dynamic stability and body mechanics with lifting. Wide BOS and limited stability evident with weighted marching. However, patient with improved stability evident with stair activities. Will continue to require practice with compliant surface balance training. Pt tolerated session well and without complaints upon leaving.   OBJECTIVE IMPAIRMENTS: Abnormal gait, decreased balance, decreased mobility, difficulty walking, and impaired vision/preception.   ACTIVITY LIMITATIONS: reach over head, locomotion level, and caring for others  PARTICIPATION LIMITATIONS: meal prep, cleaning, laundry, driving, shopping, community activity, yard work, and hiking, horseback riding  PERSONAL FACTORS: 3+ comorbidities: see above PMH and recent medical hx are also affecting patient's functional outcome.   REHAB POTENTIAL: Good  CLINICAL DECISION MAKING: Evolving/moderate complexity  EVALUATION COMPLEXITY: Moderate   PLAN:  PT FREQUENCY: 1x/week  PT DURATION: 4 weeks   PLANNED INTERVENTIONS: 97750- Physical Performance Testing, 97110-Therapeutic exercises, 97530- Therapeutic activity, 97112- Neuromuscular re-education, 97535- Self Care, [id number]- Manual therapy, [phone]- Gait training, Patient/Family education, Balance training, Stair training, and Vestibular training  PLAN FOR NEXT SESSION: review additions to HEP, compliant surface training, lifting/carrying   Louana Terrilyn Christians, PT, DPT 02/28/24 9:32 AM  [facility] at [facility] [address], [address] [place], [place] [address] Phone # [phone] Fax # [phone]  

## 2024-02-28 ENCOUNTER — Encounter: Payer: Self-pay | Admitting: Physical Therapy

## 2024-02-28 ENCOUNTER — Ambulatory Visit: Admitting: Physical Therapy

## 2024-02-28 DIAGNOSIS — R2681 Unsteadiness on feet: Secondary | ICD-10-CM | POA: Diagnosis not present

## 2024-02-28 DIAGNOSIS — R42 Dizziness and giddiness: Secondary | ICD-10-CM

## 2024-02-28 DIAGNOSIS — R2689 Other abnormalities of gait and mobility: Secondary | ICD-10-CM | POA: Diagnosis not present

## 2024-03-02 DIAGNOSIS — I70213 Atherosclerosis of native arteries of extremities with intermittent claudication, bilateral legs: Secondary | ICD-10-CM | POA: Diagnosis not present

## 2024-03-02 DIAGNOSIS — I739 Peripheral vascular disease, unspecified: Secondary | ICD-10-CM | POA: Diagnosis not present

## 2024-03-02 DIAGNOSIS — I4811 Longstanding persistent atrial fibrillation: Secondary | ICD-10-CM | POA: Diagnosis not present

## 2024-03-02 DIAGNOSIS — E785 Hyperlipidemia, unspecified: Secondary | ICD-10-CM | POA: Diagnosis not present

## 2024-03-02 DIAGNOSIS — Z8679 Personal history of other diseases of the circulatory system: Secondary | ICD-10-CM | POA: Diagnosis not present

## 2024-03-02 DIAGNOSIS — Z95820 Peripheral vascular angioplasty status with implants and grafts: Secondary | ICD-10-CM | POA: Diagnosis not present

## 2024-03-02 DIAGNOSIS — I420 Dilated cardiomyopathy: Secondary | ICD-10-CM | POA: Diagnosis not present

## 2024-03-02 DIAGNOSIS — Z95810 Presence of automatic (implantable) cardiac defibrillator: Secondary | ICD-10-CM | POA: Diagnosis not present

## 2024-03-02 DIAGNOSIS — I442 Atrioventricular block, complete: Secondary | ICD-10-CM | POA: Diagnosis not present

## 2024-03-02 DIAGNOSIS — I5022 Chronic systolic (congestive) heart failure: Secondary | ICD-10-CM | POA: Diagnosis not present

## 2024-03-05 NOTE — Therapy (Signed)
 OUTPATIENT PHYSICAL THERAPY VESTIBULAR NOTE     Patient Name: Edward Marquez MRN: [id number] DOB:May 31, 1951, 73 y.o., male Today's Date: 03/06/2024     END OF SESSION:  PT End of Session - 03/06/24 0921     Visit Number 15    Number of Visits 17    Date for PT Re-Evaluation 03/20/24    Authorization Type [employer] Medicare    Authorization Time Period approved 4 PT visits from 02/28/2024 - 04/03/2024    Authorization - Visit Number 2    Authorization - Number of Visits 4    Progress Note Due on Visit 19    PT Start Time 0846    PT Stop Time 0925    PT Time Calculation (min) 39 min    Equipment Utilized During Treatment Gait belt   gait belt needed due to imbalance   Activity Tolerance Patient tolerated treatment well    Behavior During Therapy WFL for tasks assessed/performed                   Past Medical History:  Diagnosis Date   Aortic valve defect    Atrial fibrillation (HCC)    CHF (congestive heart failure) (HCC)    Clotting disorder (HCC)    Gout    Hypertension    Peripheral vascular disease (HCC)    Past Surgical History:  Procedure Laterality Date   ABDOMINAL AORTOGRAM W/LOWER EXTREMITY N/A 05/03/2017   Procedure: ABDOMINAL AORTOGRAM W/LOWER EXTREMITY;  Surgeon: Eliza Lonni RAMAN, MD;  Location: [facility] INVASIVE CV LAB;  Service: Cardiovascular;  Laterality: N/A;   AORTIC VALVE REPLACEMENT     BYPASS GRAFT POPLITEAL TO TIBIAL Left 05/03/2017   Procedure: LEFT POPLITEAL TO ANTERIOR TIBIAL ARTERY BYPASS WITH VEIN;  Surgeon: Eliza Lonni RAMAN, MD;  Location: [facility] OR;  Service: Vascular;  Laterality: Left;   LOWER EXTREMITY ANGIOGRAPHY N/A 06/24/2017   Procedure: LOWER EXTREMITY ANGIOGRAPHY - Right;  Surgeon: Eliza Lonni RAMAN, MD;  Location: [facility] INVASIVE CV LAB;  Service: Cardiovascular;  Laterality: N/A;   PACEMAKER INSERTION     PERIPHERAL VASCULAR BALLOON ANGIOPLASTY  05/03/2017   Procedure: PERIPHERAL VASCULAR BALLOON ANGIOPLASTY;  Surgeon:  Eliza Lonni RAMAN, MD;  Location: [facility] INVASIVE CV LAB;  Service: Cardiovascular;;   PERIPHERAL VASCULAR BALLOON ANGIOPLASTY Right 06/24/2017   Procedure: PERIPHERAL VASCULAR BALLOON ANGIOPLASTY;  Surgeon: Eliza Lonni RAMAN, MD;  Location: [facility] INVASIVE CV LAB;  Service: Cardiovascular;  Laterality: Right;  ant-tibial / perneal   VEIN HARVEST Left 05/03/2017   Procedure: POPITEAL VEIN HARVEST;  Surgeon: Eliza Lonni RAMAN, MD;  Location: [facility] OR;  Service: Vascular;  Laterality: Left;   Patient Active Problem List   Diagnosis Date Noted   Acute endocarditis 09/02/2023   Bacterial endocarditis 09/02/2023   Streptococcal bacteremia 09/02/2023   Positive blood culture 09/01/2023   Stage 3a chronic kidney disease (CKD) (HCC) 09/01/2023   CAP (community acquired pneumonia) 08/31/2023   Positive colorectal cancer screening using Cologuard test 08/29/2022   Hyperlipidemia LDL goal <70 07/31/2022   Hammer toes, bilateral 07/31/2022   Effusion, left knee 03/14/2022   Atrial fibrillation (HCC) 08/14/2017   History of implantable cardioverter-defibrillator (ICD) placement 08/14/2017   PAD (peripheral artery disease) (HCC) 05/03/2017   PVD (peripheral vascular disease) (HCC) 04/30/2017    PCP: Jhon Elveria LABOR, MD  REFERRING PROVIDER: Jhon Elveria LABOR, MD   REFERRING DIAG:  [phone] (ICD-10-CM) - Personal history of transient ischemic attack (TIA), and cerebral infarction without residual deficits  R42 (ICD-10-CM) - Dizziness  and giddiness    THERAPY DIAG:  Unsteadiness on feet  Other abnormalities of gait and mobility  Dizziness and giddiness  ONSET DATE: 12/05/2023  Rationale for Evaluation and Treatment: Rehabilitation  SUBJECTIVE:   SUBJECTIVE STATEMENT: I had an episode where I felt dizzy, couldn't focus on anything, and I was seeing double. Lasted for 15-20 sec. Reports that he let his GP know about this episode. Pt reports that he feels that his balance is a little  better. Vascular testing was normal but still undergoing treatment for a-fib.    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  PMH includesAAA, A-fib, Cardiomyopathy, CHF, hx of aortic valve replacement, HTN, HLD, ICD, PVD, CVA, tinnitus  PAIN:  Are you having pain? No  PRECAUTIONS: Fall and Other: Avoid lifting 20#    Has ICD; pt did have sternal precautions, but he reports they have been lifted. RED FLAGS: None   WEIGHT BEARING RESTRICTIONS: No  FALLS: Has patient fallen in last 6 months? Yes. Number of falls 1  LIVING ENVIRONMENT: Lives with: lives with their spouse Lives in: House/apartment Stairs: 5 steps to enter front; 12 steps into second floor (bedroom); rails Has following equipment at home: Single point cane and Walker - 2 wheeled  PLOF: Independent  PATIENT GOALS: To get back to normal, prior level of activities  OBJECTIVE:     TODAY'S TREATMENT: 03/06/24 Activity Comments  Vitals at start of session  143/73 mmHg, 60bpm   Nustep L5 x 6 min UEs/LEs Dynamic warmup; cueing to slow down d/t pt quick to fatigue. Initially started with L6 and LEs only but adjusted d/t fatigue   high knee march suitcase carry 15# Increased instability when participating in conversation   sidestepping on foam beam  In II bars; Ankle instability   stepping onto stepping stones In II bars; Cueing for pacing and reduced control   1 foot on step, lift offs Difficulty on L LE  March on foam Improved control and pacing     PATIENT EDUCATION: Education details: edu on s/sx of stroke and to call 911 if sx occur Person educated: Patient Education method: Explanation Education comprehension: verbalized understanding    HOME EXERCISE PROGRAM Access Code: [email or web address] URL:  [email or web address] Date: 02/21/2024 Prepared by: [facility] Out[facility]  Rehab - [facility]  Program Notes perform in a corner with a chair in front for safety**Stand on cushion near a counter:  Step forward>back 10 reps with one foot, then repeat with the other footThen step out to the side/return to cushion, alternating feet 10 reps  Exercises - Standing Gaze Stabilization with Head Rotation  - 1 x daily - 5 x weekly - 2-3 sets - 30 sec hold - Standing Gaze Stabilization with Head Nod  - 1 x daily - 5 x weekly - 2-3 sets - 30 sec hold - Standing March with Counter Support  - 1 x daily - 5 x weekly - 2 sets - 20 reps - Standing Balance with Eyes Closed on Foam  - 1 x daily - 5 x weekly - 2 sets - 30 sec  hold - Standing Tandem Balance with Counter Support  - 1 x daily - 5 x weekly - 1 sets - 3-5 reps - 30 sec hold - Seated Long Arc Quad  - 1 x daily - 5  x weekly - 2 sets - 10 reps - Standing Hip Abduction with Counter Support  - 1 x daily - 5 x weekly - 2 sets - 10 reps - Standing Alternating Knee Flexion with Ankle Weights  - 1 x daily - 5 x weekly - 2 sets - 10 reps        Note: Objective measures were completed at Evaluation unless otherwise noted.  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   SYMPTOM BEHAVIOR:  Subjective history: See above-wife provided most of hx.  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  Since the CVA, he has had visual disturbances with standing and walking-more vertical motion of eyes and blurred vision.  Non-Vestibular symptoms: tinnitus  Type of dizziness: Imbalance (Disequilibrium), Oscillopsia, and Unsteady with head/body turns  Frequency: throughout the day   Duration: more as he gets tired  Aggravating factors: Induced by motion: looking up at the ceiling, turning body quickly, and turning head quickly and Worse with fatigue  Relieving factors: slow movements  Progression of symptoms: better  OCULOMOTOR EXAM:  Ocular Alignment: normal  Ocular ROM: No Limitations  Spontaneous Nystagmus: absent  Gaze-Induced Nystagmus: right beating with right gaze and left beating with left gaze  Smooth Pursuits: intact  Saccades: intact  Convergence/Divergence: 2-3 cm    VESTIBULAR - OCULAR REFLEX:   Slow VOR: Positive Bilaterally horizontal and vertical; blurriness  VOR Cancellation: Comment: no symptoms horizontal; would incr symptoms vertical  Head-Impulse Test: HIT Right: slowed HIT Left: slowed      MOTION SENSITIVITY: *NOT TESTED AT EVAL*  Motion Sensitivity Quotient Intensity: 0 = none, 1 = Lightheaded, 2 = Mild, 3 = Moderate, 4 = Severe, 5 = Vomiting  Intensity  1. Sitting to supine   2. Supine to L side   3. Supine to R side   4. Supine to sitting   5. L Hallpike-Dix   6. Up from L    7. R Hallpike-Dix   8. Up from R    9. Sitting, head tipped to L knee   10. Head up from L knee   11. Sitting, head tipped to R knee   12. Head up from R knee   13. Sitting head turns x5   14.Sitting head nods x5   15. In stance, 180 turn to L    16. In stance, 180 turn to R      FUNCTIONAL GAIT: 10 meter walk test: 19.91 sec = 1.65 ft/sec   M-CTSIB  Condition 1: Firm  Surface, EO 30 Sec, Normal Sway  Condition 2: Firm Surface, EC 30 Sec, Mild Sway  Condition 3: Foam Surface, EO 30 Sec, Mild Sway  Condition 4: Foam Surface, EC 13.44 Sec, Severe Sway  TREATMENT DATE: 12/10/2023    PATIENT EDUCATION: Education details: Eval results, POC; continue current exercises that patient was doing at home for standing VOR Person educated: Patient and Spouse Education method: Explanation Education comprehension: verbalized understanding  HOME EXERCISE PROGRAM:  GOALS: Goals reviewed with patient? Yes  SHORT TERM GOALS: Target date: 01/10/2024  Pt will be independent with HEP for improved balance, gait. Baseline: Goal status: MET  2.  Pt will improve gait velocity to at least 1.8 ft/sec for improved gait efficiency and safety. Baseline: 1.65 ft/sec; 2.7 ft/sec w/ cane 3.2 ft/sec w/out cane Goal status: MET (01/09/24)  3.  Pt will perform Condition 4 on MCTSIB for 30 sec with mod sway for improved overall balance. Baseline: 13.34 sec; 30 sec mild Goal status: MET (01/09/24)  LONG TERM GOALS: Target date: 03/20/2024  Pt will be independent with advanced HEP for improved balance, gait. Baseline: update 02/21/24 Goal status: IN PROGRESS 02/21/24  2.  Pt will improve gait velocity to at least 2.3 ft/sec for improved gait efficiency and safety. Baseline: 1.65 ft/sec; 01/23/24;  (2.6 ft/sec) 01/23/24 Goal status: MET 01/23/24  3.  Pt will improve MCTSIB Condition 4 to mild sway for 30 seconds for improved overall balance. Baseline: 13.34 sec; 7 sec before LOB 01/23/24; 2 sec with LOB 02/21/24 Goal status: IN PROGRESS 02/21/24  4.  FGA to be assessed and pt to improve to at least 22/30 for decreased fall risk. Baseline: 14/30 12/16/23; 19/30 01/23/24; 19 02/21/24 Goal status: IN PROGRESS 02/21/24  5.  Pt will ambulate at least 1000 ft, outdoor  surfaces, least restrictive device, mod I, for return to walking his [place] and hiking activities. Baseline: Mild instability with cane, moderate instability without 01/23/24; no LOB with walking stick on grass 02/21/24 Goal status: IN PROGRESS 02/21/24  6.  DHI score to improve to 10 or less for decreased dizziness impacting function. Baseline: 28; 4/100 01/23/24 Goal status: MET 01/23/24  7.  Patient to report ability to perform barn activities like lifting from the ground and carrying heavy items.  Baseline: unable; pt reports improved ability for lifting horse feed 02/21/24 Goal status: IN PROGRESS 02/21/24  ASSESSMENT:  CLINICAL IMPRESSION: Patient arrived to session with report of 15-20 sec of dizziness and diplopia. He reports that he has made his PCP aware. Proceeded with balance activities with compliant surface, lifting, and head movement challenges.  Patient benefits from cues for pacing and control to improve safety and stability. No complaints at end of session.   OBJECTIVE IMPAIRMENTS: Abnormal gait, decreased balance, decreased mobility, difficulty walking, and impaired vision/preception.   ACTIVITY LIMITATIONS: reach over head, locomotion level, and caring for others  PARTICIPATION LIMITATIONS: meal prep, cleaning, laundry, driving, shopping, community activity, yard work, and hiking, horseback riding  PERSONAL FACTORS: 3+ comorbidities: see above PMH and recent medical hx are also affecting patient's functional outcome.   REHAB POTENTIAL: Good  CLINICAL DECISION MAKING: Evolving/moderate complexity  EVALUATION COMPLEXITY: Moderate   PLAN:  PT FREQUENCY: 1x/week  PT DURATION: 4 weeks   PLANNED INTERVENTIONS: 97750- Physical Performance Testing, 97110-Therapeutic exercises, 97530- Therapeutic activity, 97112- Neuromuscular re-education, 97535- Self Care, [id number]- Manual therapy, [phone]- Gait training, Patient/Family education, Balance training, Stair training, and  Vestibular training  PLAN FOR NEXT SESSION: plan for upcoming DC on  03/20/24; compliant surface training, lifting/carrying   Louana Terrilyn Christians, PT, DPT 03/06/24 9:28 AM  [facility] at [facility] [address] [place], [place] [address] Phone # [phone])  [phone] Fax # [phone]

## 2024-03-06 ENCOUNTER — Encounter: Payer: Self-pay | Admitting: Physical Therapy

## 2024-03-06 ENCOUNTER — Ambulatory Visit: Admitting: Physical Therapy

## 2024-03-06 DIAGNOSIS — R2681 Unsteadiness on feet: Secondary | ICD-10-CM

## 2024-03-06 DIAGNOSIS — R2689 Other abnormalities of gait and mobility: Secondary | ICD-10-CM

## 2024-03-06 DIAGNOSIS — R42 Dizziness and giddiness: Secondary | ICD-10-CM

## 2024-03-09 DIAGNOSIS — R0683 Snoring: Secondary | ICD-10-CM | POA: Diagnosis not present

## 2024-03-12 DIAGNOSIS — H9193 Unspecified hearing loss, bilateral: Secondary | ICD-10-CM | POA: Diagnosis not present

## 2024-03-12 DIAGNOSIS — R5383 Other fatigue: Secondary | ICD-10-CM | POA: Diagnosis not present

## 2024-03-12 DIAGNOSIS — E559 Vitamin D deficiency, unspecified: Secondary | ICD-10-CM | POA: Diagnosis not present

## 2024-03-12 DIAGNOSIS — D649 Anemia, unspecified: Secondary | ICD-10-CM | POA: Diagnosis not present

## 2024-03-13 ENCOUNTER — Ambulatory Visit: Admitting: Physical Therapy

## 2024-03-13 ENCOUNTER — Encounter: Payer: Self-pay | Admitting: Physical Therapy

## 2024-03-13 DIAGNOSIS — R7303 Prediabetes: Secondary | ICD-10-CM | POA: Diagnosis not present

## 2024-03-13 DIAGNOSIS — R42 Dizziness and giddiness: Secondary | ICD-10-CM | POA: Diagnosis not present

## 2024-03-13 DIAGNOSIS — R5383 Other fatigue: Secondary | ICD-10-CM | POA: Diagnosis not present

## 2024-03-13 DIAGNOSIS — R2689 Other abnormalities of gait and mobility: Secondary | ICD-10-CM | POA: Insufficient documentation

## 2024-03-13 DIAGNOSIS — R2681 Unsteadiness on feet: Secondary | ICD-10-CM | POA: Diagnosis not present

## 2024-03-13 DIAGNOSIS — E559 Vitamin D deficiency, unspecified: Secondary | ICD-10-CM | POA: Diagnosis not present

## 2024-03-13 NOTE — Therapy (Signed)
 OUTPATIENT PHYSICAL THERAPY VESTIBULAR NOTE     Patient Name: Edward Marquez MRN: [id number] DOB:03-09-1951, 73 y.o., male Today's Date: 03/13/2024     END OF SESSION:  PT End of Session - 03/13/24 0849     Visit Number 16    Number of Visits 17    Date for PT Re-Evaluation 03/20/24    Authorization Type [employer] Medicare    Authorization Time Period approved 4 PT visits from 02/28/2024 - 04/03/2024    Authorization - Visit Number 3    Authorization - Number of Visits 4    Progress Note Due on Visit 19    PT Start Time 0849    PT Stop Time 0930    PT Time Calculation (min) 41 min    Equipment Utilized During Treatment Gait belt   gait belt needed due to imbalance   Activity Tolerance Patient tolerated treatment well    Behavior During Therapy WFL for tasks assessed/performed                    Past Medical History:  Diagnosis Date   Aortic valve defect    Atrial fibrillation (HCC)    CHF (congestive heart failure) (HCC)    Clotting disorder (HCC)    Gout    Hypertension    Peripheral vascular disease (HCC)    Past Surgical History:  Procedure Laterality Date   ABDOMINAL AORTOGRAM W/LOWER EXTREMITY N/A 05/03/2017   Procedure: ABDOMINAL AORTOGRAM W/LOWER EXTREMITY;  Surgeon: Eliza Lonni RAMAN, MD;  Location: [facility] INVASIVE CV LAB;  Service: Cardiovascular;  Laterality: N/A;   AORTIC VALVE REPLACEMENT     BYPASS GRAFT POPLITEAL TO TIBIAL Left 05/03/2017   Procedure: LEFT POPLITEAL TO ANTERIOR TIBIAL ARTERY BYPASS WITH VEIN;  Surgeon: Eliza Lonni RAMAN, MD;  Location: [facility] OR;  Service: Vascular;  Laterality: Left;   LOWER EXTREMITY ANGIOGRAPHY N/A 06/24/2017   Procedure: LOWER EXTREMITY ANGIOGRAPHY - Right;  Surgeon: Eliza Lonni RAMAN, MD;  Location: [facility] INVASIVE CV LAB;  Service: Cardiovascular;  Laterality: N/A;   PACEMAKER INSERTION     PERIPHERAL VASCULAR BALLOON ANGIOPLASTY  05/03/2017   Procedure: PERIPHERAL VASCULAR BALLOON ANGIOPLASTY;  Surgeon:  Eliza Lonni RAMAN, MD;  Location: [facility] INVASIVE CV LAB;  Service: Cardiovascular;;   PERIPHERAL VASCULAR BALLOON ANGIOPLASTY Right 06/24/2017   Procedure: PERIPHERAL VASCULAR BALLOON ANGIOPLASTY;  Surgeon: Eliza Lonni RAMAN, MD;  Location: [facility] INVASIVE CV LAB;  Service: Cardiovascular;  Laterality: Right;  ant-tibial / perneal   VEIN HARVEST Left 05/03/2017   Procedure: POPITEAL VEIN HARVEST;  Surgeon: Eliza Lonni RAMAN, MD;  Location: [facility] OR;  Service: Vascular;  Laterality: Left;   Patient Active Problem List   Diagnosis Date Noted   Acute endocarditis 09/02/2023   Bacterial endocarditis 09/02/2023   Streptococcal bacteremia 09/02/2023   Positive blood culture 09/01/2023   Stage 3a chronic kidney disease (CKD) (HCC) 09/01/2023   CAP (community acquired pneumonia) 08/31/2023   Positive colorectal cancer screening using Cologuard test 08/29/2022   Hyperlipidemia LDL goal <70 07/31/2022   Hammer toes, bilateral 07/31/2022   Effusion, left knee 03/14/2022   Atrial fibrillation (HCC) 08/14/2017   History of implantable cardioverter-defibrillator (ICD) placement 08/14/2017   PAD (peripheral artery disease) (HCC) 05/03/2017   PVD (peripheral vascular disease) (HCC) 04/30/2017    PCP: Jhon Elveria LABOR, MD  REFERRING PROVIDER: Jhon Elveria LABOR, MD   REFERRING DIAG:  [phone] (ICD-10-CM) - Personal history of transient ischemic attack (TIA), and cerebral infarction without residual deficits  R42 (ICD-10-CM) -  Dizziness and giddiness    THERAPY DIAG:  Unsteadiness on feet  Other abnormalities of gait and mobility  Dizziness and giddiness  ONSET DATE: 12/05/2023  Rationale for Evaluation and Treatment: Rehabilitation  SUBJECTIVE:   SUBJECTIVE STATEMENT: Pt states he was put on amiodorone for the past week. Going to get cardioversion. Pt reports he is not feeling good today -- ears are ringing thinks it may be due to the amiodorone. Going to get blood drawn. Vitals have  been okay. Reports increased fatigue.    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  PMH includesAAA, A-fib, Cardiomyopathy, CHF, hx of aortic valve replacement, HTN, HLD, ICD, PVD, CVA, tinnitus  PAIN:  Are you having pain? No  PRECAUTIONS: Fall and Other: Avoid lifting 20#    Has ICD; pt did have sternal precautions, but he reports they have been lifted. RED FLAGS: None   WEIGHT BEARING RESTRICTIONS: No  FALLS: Has patient fallen in last 6 months? Yes. Number of falls 1  LIVING ENVIRONMENT: Lives with: lives with their spouse Lives in: House/apartment Stairs: 5 steps to enter front; 12 steps into second floor (bedroom); rails Has following equipment at home: Single point cane and Walker - 2 wheeled  PLOF: Independent  PATIENT GOALS: To get back to normal, prior level of activities  OBJECTIVE:   TODAY'S TREATMENT: 03/13/2024 Activity Comments     Nustep L5 x 10 min UEs/LEs Dynamic warmup  mCTSIB Condition 1: 30 sec Condition 2: 30 sec; mild sway Condition 3: 30 sec Condition 4: 30 sec; mild sway  Walk and squat 15lb KB to various cones Walk and deadlift 15 lb KB to various cones   sidestepping on foam beam  In II bars; Ankle instability with intermittent UE support  Tandem walking on foam beam In // bars  Standing on foam beam, side tap off x10 In // bars, unstable in foot/ankle. Limited by fatigue  Standing on foam beam, fwd step tap x10 R&L In // bars, unstable in foot/ankle. Limited by fatigue    TODAY'S TREATMENT: 03/06/24 Activity Comments  Vitals at start of session  143/73 mmHg, 60bpm   Nustep L5 x 6 min UEs/LEs Dynamic warmup; cueing to slow down d/t pt quick to fatigue. Initially started with L6 and LEs only but adjusted d/t fatigue   high knee march suitcase carry 15#  Increased instability when participating in conversation   sidestepping on foam beam  In II bars; Ankle instability   stepping onto stepping stones In II bars; Cueing for pacing and reduced control   1 foot on step, lift offs Difficulty on L LE  March on foam Improved control and pacing     PATIENT EDUCATION: Education details: edu on s/sx of stroke and to call 911 if sx occur Person educated: Patient Education method: Explanation Education comprehension: verbalized understanding    HOME EXERCISE PROGRAM Access Code: [email or web address] URL: [email or web address] Date: 02/21/2024 Prepared by: [facility] - Outpatient  Rehab - [facility]  Program Notes perform in a corner with a chair in front for safety**Stand on cushion near a counter:  Step forward>back 10 reps with one foot, then repeat with the other footThen step out to the side/return to cushion, alternating feet 10 reps  Exercises - Standing Gaze Stabilization with Head Rotation  - 1 x daily - 5 x  weekly - 2-3 sets - 30 sec hold - Standing Gaze Stabilization with Head Nod  - 1 x daily - 5 x weekly - 2-3 sets - 30 sec hold - Standing March with Counter Support  - 1 x daily - 5 x weekly - 2 sets - 20 reps - Standing Balance with Eyes Closed on Foam  - 1 x daily - 5 x weekly - 2 sets - 30 sec  hold - Standing Tandem Balance with Counter Support  - 1 x daily - 5 x weekly - 1 sets - 3-5 reps - 30 sec hold - Seated Long Arc Quad  - 1 x daily - 5 x weekly - 2 sets - 10 reps - Standing Hip Abduction with Counter Support  - 1 x daily - 5 x weekly - 2 sets - 10 reps - Standing Alternating Knee Flexion with Ankle Weights  - 1 x daily - 5 x weekly - 2 sets - 10 reps        Note: Objective measures were completed at Evaluation unless otherwise noted.  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   SYMPTOM BEHAVIOR:  Subjective history: See above-wife provided most of hx.  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  Since the CVA, he has had visual disturbances with standing and walking-more vertical motion of eyes and blurred vision.  Non-Vestibular symptoms: tinnitus  Type of dizziness: Imbalance (Disequilibrium), Oscillopsia, and Unsteady with head/body turns  Frequency: throughout the day   Duration: more as he gets tired  Aggravating factors: Induced by motion: looking up at the ceiling, turning body quickly, and turning head quickly and Worse with fatigue  Relieving factors: slow movements  Progression of symptoms: better  OCULOMOTOR EXAM:  Ocular Alignment: normal  Ocular ROM: No Limitations  Spontaneous Nystagmus: absent  Gaze-Induced Nystagmus: right beating with right gaze and left beating with left gaze  Smooth Pursuits: intact  Saccades: intact  Convergence/Divergence: 2-3 cm    VESTIBULAR - OCULAR REFLEX:   Slow VOR: Positive Bilaterally horizontal and vertical; blurriness  VOR Cancellation: Comment: no symptoms horizontal; would incr symptoms vertical  Head-Impulse Test: HIT Right: slowed HIT Left: slowed      MOTION SENSITIVITY: *NOT TESTED AT EVAL*  Motion Sensitivity Quotient Intensity: 0 = none, 1 = Lightheaded, 2 = Mild, 3 = Moderate, 4 = Severe, 5 =  Vomiting  Intensity  1. Sitting to supine   2. Supine to L side   3. Supine to R side   4. Supine to sitting   5. L Hallpike-Dix   6. Up from L    7. R Hallpike-Dix   8. Up from R    9. Sitting, head tipped to L knee   10. Head up from L knee   11. Sitting, head tipped to R knee  12. Head up from R knee   13. Sitting head turns x5   14.Sitting head nods x5   15. In stance, 180 turn to L    16. In stance, 180 turn to R      FUNCTIONAL GAIT: 10 meter walk test: 19.91 sec = 1.65 ft/sec   M-CTSIB  Condition 1: Firm Surface, EO 30 Sec, Normal Sway  Condition 2: Firm Surface, EC 30 Sec, Mild Sway  Condition 3: Foam Surface, EO 30 Sec, Mild Sway  Condition 4: Foam Surface, EC 13.44 Sec, Severe Sway                                                                                                                              TREATMENT DATE: 12/10/2023    PATIENT EDUCATION: Education details: Eval results, POC; continue current exercises that patient was doing at home for standing VOR Person educated: Patient and Spouse Education method: Explanation Education comprehension: verbalized understanding  HOME EXERCISE PROGRAM:  GOALS: Goals reviewed with patient? Yes  SHORT TERM GOALS: Target date: 01/10/2024  Pt will be independent with HEP for improved balance, gait. Baseline: Goal status: MET  2.  Pt will improve gait velocity to at least 1.8 ft/sec for improved gait efficiency and safety. Baseline: 1.65 ft/sec; 2.7 ft/sec w/ cane 3.2 ft/sec w/out cane Goal status: MET (01/09/24)  3.  Pt will perform Condition 4 on MCTSIB for 30 sec with mod sway for improved overall balance. Baseline: 13.34 sec; 30 sec mild Goal status: MET (01/09/24)  LONG TERM GOALS: Target date: 03/20/2024  Pt will be independent with advanced HEP for improved balance, gait. Baseline: update 02/21/24 Goal status: IN PROGRESS 02/21/24  2.  Pt will improve gait velocity to at least 2.3 ft/sec for  improved gait efficiency and safety. Baseline: 1.65 ft/sec; 01/23/24;  (2.6 ft/sec) 01/23/24 Goal status: MET 01/23/24  3.  Pt will improve MCTSIB Condition 4 to mild sway for 30 seconds for improved overall balance. Baseline: 13.34 sec; 7 sec before LOB 01/23/24; 2 sec with LOB 02/21/24; 30 sec mild sway 03/13/24 Goal status: MET  4.  FGA to be assessed and pt to improve to at least 22/30 for decreased fall risk. Baseline: 14/30 12/16/23; 19/30 01/23/24; 19 02/21/24 Goal status: IN PROGRESS 02/21/24  5.  Pt will ambulate at least 1000 ft, outdoor surfaces, least restrictive device, mod I, for return to walking his [place] and hiking activities. Baseline: Mild instability with cane, moderate instability without 01/23/24; no LOB with walking stick on grass 02/21/24 Goal status: IN PROGRESS 02/21/24  6.  DHI score to improve to 10 or less for decreased dizziness impacting function. Baseline: 28; 4/100 01/23/24 Goal status: MET 01/23/24  7.  Patient to report ability to perform barn activities like lifting from the ground and carrying heavy items.  Baseline: unable; pt reports improved ability for lifting horse feed 02/21/24; 90% back to normal ability to perform 03/13/24 Goal status:  PARTIALLY MET   ASSESSMENT:  CLINICAL IMPRESSION: Reports being on amiodorone for the past week but didn't feel good on it so took himself off 2 days ago. Still has ears ringing for the past 4 days. Vitals stable and HR <90 bpm during exercise. Rechecked mCTSIB with pt demonstrating good stability in condition 4 this session. Performed lifting and carrying without issues today. Greatest deficit is his endurance and balance on compliant and unlevel surfaces. Plan for d/c next visit -- check FGA and outdoor amb. Provided pt info and referral to [facility] this session.   OBJECTIVE IMPAIRMENTS: Abnormal gait, decreased balance, decreased mobility, difficulty walking, and impaired vision/preception.   ACTIVITY LIMITATIONS: reach  over head, locomotion level, and caring for others  PARTICIPATION LIMITATIONS: meal prep, cleaning, laundry, driving, shopping, community activity, yard work, and hiking, horseback riding  PERSONAL FACTORS: 3+ comorbidities: see above PMH and recent medical hx are also affecting patient's functional outcome.   REHAB POTENTIAL: Good  CLINICAL DECISION MAKING: Evolving/moderate complexity  EVALUATION COMPLEXITY: Moderate   PLAN:  PT FREQUENCY: 1x/week  PT DURATION: 4 weeks   PLANNED INTERVENTIONS: 97750- Physical Performance Testing, 97110-Therapeutic exercises, 97530- Therapeutic activity, 97112- Neuromuscular re-education, 97535- Self Care, [id number]- Manual therapy, [phone]- Gait training, Patient/Family education, Balance training, Stair training, and Vestibular training  PLAN FOR NEXT SESSION: plan for upcoming DC on  03/20/24; compliant surface training, lifting/carrying   Lanell Dubie April Ma L [place], PT, DPT 03/13/24 8:49 AM  [facility] at [facility] [address], [address] [place], [place] [address] Phone # [phone] Fax # [phone]

## 2024-03-19 NOTE — Therapy (Signed)
 OUTPATIENT PHYSICAL THERAPY VESTIBULAR NOTE     Patient Name: Edward Marquez MRN: [id number] DOB:1950/09/12, 73 y.o., male Today's Date: 03/19/2024     END OF SESSION:              Past Medical History:  Diagnosis Date   Aortic valve defect    Atrial fibrillation (HCC)    CHF (congestive heart failure) (HCC)    Clotting disorder (HCC)    Gout    Hypertension    Peripheral vascular disease (HCC)    Past Surgical History:  Procedure Laterality Date   ABDOMINAL AORTOGRAM W/LOWER EXTREMITY N/A 05/03/2017   Procedure: ABDOMINAL AORTOGRAM W/LOWER EXTREMITY;  Surgeon: Eliza Lonni RAMAN, MD;  Location: [facility] INVASIVE CV LAB;  Service: Cardiovascular;  Laterality: N/A;   AORTIC VALVE REPLACEMENT     BYPASS GRAFT POPLITEAL TO TIBIAL Left 05/03/2017   Procedure: LEFT POPLITEAL TO ANTERIOR TIBIAL ARTERY BYPASS WITH VEIN;  Surgeon: Eliza Lonni RAMAN, MD;  Location: [facility] OR;  Service: Vascular;  Laterality: Left;   LOWER EXTREMITY ANGIOGRAPHY N/A 06/24/2017   Procedure: LOWER EXTREMITY ANGIOGRAPHY - Right;  Surgeon: Eliza Lonni RAMAN, MD;  Location: [facility] INVASIVE CV LAB;  Service: Cardiovascular;  Laterality: N/A;   PACEMAKER INSERTION     PERIPHERAL VASCULAR BALLOON ANGIOPLASTY  05/03/2017   Procedure: PERIPHERAL VASCULAR BALLOON ANGIOPLASTY;  Surgeon: Eliza Lonni RAMAN, MD;  Location: [facility] INVASIVE CV LAB;  Service: Cardiovascular;;   PERIPHERAL VASCULAR BALLOON ANGIOPLASTY Right 06/24/2017   Procedure: PERIPHERAL VASCULAR BALLOON ANGIOPLASTY;  Surgeon: Eliza Lonni RAMAN, MD;  Location: [facility] INVASIVE CV LAB;  Service: Cardiovascular;  Laterality: Right;  ant-tibial / perneal   VEIN HARVEST Left 05/03/2017   Procedure: POPITEAL VEIN HARVEST;  Surgeon: Eliza Lonni RAMAN, MD;  Location: [facility] OR;  Service: Vascular;  Laterality: Left;   Patient Active Problem List   Diagnosis Date Noted   Acute endocarditis 09/02/2023   Bacterial endocarditis 09/02/2023    Streptococcal bacteremia 09/02/2023   Positive blood culture 09/01/2023   Stage 3a chronic kidney disease (CKD) (HCC) 09/01/2023   CAP (community acquired pneumonia) 08/31/2023   Positive colorectal cancer screening using Cologuard test 08/29/2022   Hyperlipidemia LDL goal <70 07/31/2022   Hammer toes, bilateral 07/31/2022   Effusion, left knee 03/14/2022   Atrial fibrillation (HCC) 08/14/2017   History of implantable cardioverter-defibrillator (ICD) placement 08/14/2017   PAD (peripheral artery disease) (HCC) 05/03/2017   PVD (peripheral vascular disease) (HCC) 04/30/2017    PCP: Jhon Elveria LABOR, MD  REFERRING PROVIDER: Jhon Elveria LABOR, MD   REFERRING DIAG:  [id number] (ICD-10-CM) - Personal history of transient ischemic attack (TIA), and cerebral infarction without residual deficits  R42 (ICD-10-CM) - Dizziness and giddiness    THERAPY DIAG:  No diagnosis found.  ONSET DATE: 12/05/2023  Rationale for Evaluation and Treatment: Rehabilitation  SUBJECTIVE:   SUBJECTIVE STATEMENT: Pt states he was put on amiodorone for the past week. Going to get cardioversion. Pt reports he is not feeling good today -- ears are ringing thinks it may be due to the amiodorone. Going to get blood drawn. Vitals have been okay. Reports increased fatigue.    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  PMH includesAAA, A-fib, Cardiomyopathy, CHF, hx of aortic valve replacement, HTN, HLD, ICD, PVD, CVA, tinnitus  PAIN:  Are you having pain? No  PRECAUTIONS:  Fall and Other: Avoid lifting 20#    Has ICD; pt did have sternal precautions, but he reports they have been lifted. RED FLAGS: None   WEIGHT BEARING RESTRICTIONS: No  FALLS: Has patient fallen in last 6 months? Yes. Number of falls 1  LIVING  ENVIRONMENT: Lives with: lives with their spouse Lives in: House/apartment Stairs: 5 steps to enter front; 12 steps into second floor (bedroom); rails Has following equipment at home: Single point cane and Walker - 2 wheeled  PLOF: Independent  PATIENT GOALS: To get back to normal, prior level of activities  OBJECTIVE:    TODAY'S TREATMENT: 03/19/24  Activity Comments                       HOME EXERCISE PROGRAM Access Code: [email or web address] URL: [email or web address] Date: 02/21/2024 Prepared by: [facility] - Outpatient  Rehab - [facility]  Program Notes perform in a corner with a chair in front for safety**Stand on cushion near a counter:  Step forward>back 10 reps with one foot, then repeat with the other footThen step out to the side/return to cushion, alternating feet 10 reps  Exercises - Standing Gaze Stabilization with Head Rotation  - 1 x daily - 5 x weekly - 2-3 sets - 30 sec hold - Standing Gaze Stabilization with Head Nod  - 1 x daily - 5 x weekly - 2-3 sets - 30 sec hold - Standing March with Counter Support  - 1 x daily - 5 x weekly - 2 sets - 20 reps - Standing Balance with Eyes Closed on Foam  - 1 x daily - 5 x weekly - 2 sets - 30 sec  hold - Standing Tandem Balance with Counter Support  - 1 x daily - 5 x weekly - 1 sets - 3-5 reps - 30 sec hold - Seated Long Arc Quad  - 1 x daily - 5 x weekly - 2 sets - 10 reps - Standing Hip Abduction with Counter Support  - 1 x daily - 5 x weekly - 2 sets - 10 reps - Standing Alternating Knee Flexion with Ankle Weights  - 1 x daily - 5 x weekly - 2 sets - 10 reps        Note: Objective measures were completed at Evaluation unless otherwise noted.  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   SYMPTOM BEHAVIOR:  Subjective history: See above-wife provided most of hx.  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  Since the CVA, he has had visual disturbances with standing and walking-more vertical motion of eyes and blurred vision.  Non-Vestibular symptoms: tinnitus  Type of dizziness: Imbalance (Disequilibrium), Oscillopsia, and Unsteady with head/body turns  Frequency: throughout the day   Duration: more as he gets tired  Aggravating factors: Induced by motion: looking  up at the ceiling, turning body quickly, and turning head quickly and Worse with fatigue  Relieving factors: slow movements  Progression of symptoms: better  OCULOMOTOR EXAM:  Ocular Alignment: normal  Ocular ROM: No Limitations  Spontaneous Nystagmus: absent  Gaze-Induced Nystagmus: right beating with right gaze and left beating with left gaze  Smooth Pursuits: intact  Saccades: intact  Convergence/Divergence: 2-3 cm    VESTIBULAR - OCULAR REFLEX:   Slow VOR: Positive Bilaterally horizontal and vertical; blurriness  VOR Cancellation: Comment: no symptoms horizontal; would incr symptoms vertical  Head-Impulse Test: HIT Right: slowed HIT Left: slowed      MOTION SENSITIVITY: *NOT TESTED AT EVAL*  Motion Sensitivity Quotient Intensity: 0 = none, 1 = Lightheaded, 2 = Mild, 3 = Moderate, 4 = Severe, 5 = Vomiting  Intensity  1. Sitting to supine   2. Supine to L side   3. Supine to R side    4. Supine to sitting   5. L Hallpike-Dix   6. Up from L    7. R Hallpike-Dix   8. Up from R    9. Sitting, head tipped to L knee   10. Head up from L knee   11. Sitting, head tipped to R knee   12. Head up from R knee   13. Sitting head turns x5   14.Sitting head nods x5   15. In stance, 180 turn to L    16. In stance, 180 turn to R      FUNCTIONAL GAIT: 10 meter walk test: 19.91 sec = 1.65 ft/sec   M-CTSIB  Condition 1: Firm Surface, EO 30 Sec, Normal Sway  Condition 2: Firm Surface, EC 30 Sec, Mild Sway  Condition 3: Foam Surface, EO 30 Sec, Mild Sway  Condition 4: Foam Surface, EC 13.44 Sec, Severe Sway                                                                                                                              TREATMENT DATE: 12/10/2023    PATIENT EDUCATION: Education details: Eval results, POC; continue current exercises that patient was doing at home for standing VOR Person educated: Patient and Spouse Education method: Explanation Education comprehension: verbalized understanding  HOME EXERCISE PROGRAM:  GOALS: Goals reviewed with patient? Yes  SHORT TERM GOALS: Target date: 01/10/2024  Pt will be independent with HEP for improved balance, gait. Baseline: Goal status: MET  2.  Pt will improve gait velocity to at least 1.8 ft/sec for improved gait efficiency and safety. Baseline: 1.65 ft/sec; 2.7 ft/sec w/ cane 3.2 ft/sec w/out cane Goal status: MET (01/09/24)  3.  Pt will perform Condition 4 on MCTSIB for 30 sec with mod sway for improved overall balance. Baseline: 13.34 sec; 30 sec mild Goal status: MET (01/09/24)  LONG TERM GOALS: Target date: 03/20/2024  Pt will be independent with advanced HEP for improved balance, gait. Baseline: update 02/21/24 Goal status: IN PROGRESS 02/21/24  2.  Pt will improve gait velocity to at least 2.3 ft/sec for improved gait efficiency and safety. Baseline: 1.65 ft/sec; 01/23/24;  (2.6 ft/sec)  01/23/24 Goal status: MET 01/23/24  3.  Pt will improve MCTSIB Condition 4 to mild sway for 30 seconds for improved overall balance. Baseline: 13.34 sec; 7 sec before LOB 01/23/24; 2 sec with LOB 02/21/24; 30 sec mild sway 03/13/24 Goal status: MET  4.  FGA to be assessed and pt to improve to at least 22/30 for decreased fall risk. Baseline: 14/30 12/16/23; 19/30 01/23/24; 19 02/21/24 Goal status: IN PROGRESS 02/21/24  5.  Pt will ambulate at least 1000 ft, outdoor surfaces, least restrictive device, mod I, for return to walking his [place] and hiking activities. Baseline: Mild instability with cane, moderate instability without 01/23/24; no LOB with walking stick on grass 02/21/24 Goal status: IN PROGRESS 02/21/24  6.  DHI score to improve to 10 or less for decreased dizziness impacting function. Baseline: 28; 4/100 01/23/24 Goal status: MET 01/23/24  7.  Patient to report ability to perform barn activities like lifting from the ground and carrying heavy items.  Baseline: unable; pt reports improved ability for lifting horse feed 02/21/24; 90% back to normal ability to perform 03/13/24 Goal status: PARTIALLY MET   ASSESSMENT:  CLINICAL IMPRESSION: Reports being on amiodorone for the past week but didn't feel good on it so took himself off 2 days ago. Still has ears ringing for the past 4 days. Vitals stable and HR <90 bpm during exercise. Rechecked mCTSIB with pt demonstrating good stability in condition 4 this session. Performed lifting and carrying without issues today. Greatest deficit is his endurance and balance on compliant and unlevel surfaces. Plan for d/c next visit -- check FGA and outdoor amb. Provided pt info and referral to [facility] this session.   OBJECTIVE IMPAIRMENTS: Abnormal gait, decreased balance, decreased mobility, difficulty walking, and impaired vision/preception.   ACTIVITY LIMITATIONS: reach over head, locomotion level, and caring for others  PARTICIPATION LIMITATIONS: meal  prep, cleaning, laundry, driving, shopping, community activity, yard work, and hiking, horseback riding  PERSONAL FACTORS: 3+ comorbidities: see above PMH and recent medical hx are also affecting patient's functional outcome.   REHAB POTENTIAL: Good  CLINICAL DECISION MAKING: Evolving/moderate complexity  EVALUATION COMPLEXITY: Moderate   PLAN:  PT FREQUENCY: 1x/week  PT DURATION: 4 weeks   PLANNED INTERVENTIONS: 97750- Physical Performance Testing, 97110-Therapeutic exercises, 97530- Therapeutic activity, 97112- Neuromuscular re-education, 97535- Self Care, [id number]- Manual therapy, [phone]- Gait training, Patient/Family education, Balance training, Stair training, and Vestibular training  PLAN FOR NEXT SESSION: plan for upcoming DC on  03/20/24; compliant surface training, lifting/carrying   Slater MARLA Christians, PT, DPT 03/19/24 3:37 PM  [facility] at [facility] [address], [address] [place], [place] [address] Phone # [phone] Fax # [phone]

## 2024-03-20 ENCOUNTER — Ambulatory Visit: Admitting: Physical Therapy

## 2024-03-20 ENCOUNTER — Encounter: Payer: Self-pay | Admitting: Physical Therapy

## 2024-03-20 DIAGNOSIS — R2689 Other abnormalities of gait and mobility: Secondary | ICD-10-CM | POA: Diagnosis not present

## 2024-03-20 DIAGNOSIS — R42 Dizziness and giddiness: Secondary | ICD-10-CM

## 2024-03-20 DIAGNOSIS — R2681 Unsteadiness on feet: Secondary | ICD-10-CM

## 2024-03-31 DIAGNOSIS — I330 Acute and subacute infective endocarditis: Secondary | ICD-10-CM | POA: Diagnosis not present

## 2024-03-31 DIAGNOSIS — T826XXA Infection and inflammatory reaction due to cardiac valve prosthesis, initial encounter: Secondary | ICD-10-CM | POA: Diagnosis not present

## 2024-03-31 DIAGNOSIS — Z792 Long term (current) use of antibiotics: Secondary | ICD-10-CM | POA: Diagnosis not present

## 2024-05-26 DIAGNOSIS — Z8739 Personal history of other diseases of the musculoskeletal system and connective tissue: Secondary | ICD-10-CM | POA: Diagnosis not present

## 2024-05-26 DIAGNOSIS — R739 Hyperglycemia, unspecified: Secondary | ICD-10-CM | POA: Diagnosis not present

## 2024-05-26 DIAGNOSIS — Z8673 Personal history of transient ischemic attack (TIA), and cerebral infarction without residual deficits: Secondary | ICD-10-CM | POA: Diagnosis not present

## 2024-05-26 DIAGNOSIS — Z0000 Encounter for general adult medical examination without abnormal findings: Secondary | ICD-10-CM | POA: Diagnosis not present

## 2024-05-26 DIAGNOSIS — I739 Peripheral vascular disease, unspecified: Secondary | ICD-10-CM | POA: Diagnosis not present

## 2024-05-26 DIAGNOSIS — D649 Anemia, unspecified: Secondary | ICD-10-CM | POA: Diagnosis not present

## 2024-05-26 DIAGNOSIS — E559 Vitamin D deficiency, unspecified: Secondary | ICD-10-CM | POA: Diagnosis not present

## 2024-05-26 DIAGNOSIS — Z952 Presence of prosthetic heart valve: Secondary | ICD-10-CM | POA: Diagnosis not present

## 2024-05-26 DIAGNOSIS — I420 Dilated cardiomyopathy: Secondary | ICD-10-CM | POA: Diagnosis not present

## 2024-05-26 DIAGNOSIS — E785 Hyperlipidemia, unspecified: Secondary | ICD-10-CM | POA: Diagnosis not present

## 2024-05-26 DIAGNOSIS — I5022 Chronic systolic (congestive) heart failure: Secondary | ICD-10-CM | POA: Diagnosis not present

## 2024-05-26 DIAGNOSIS — Z8679 Personal history of other diseases of the circulatory system: Secondary | ICD-10-CM | POA: Diagnosis not present

## 2024-05-26 DIAGNOSIS — I4819 Other persistent atrial fibrillation: Secondary | ICD-10-CM | POA: Diagnosis not present

## 2024-05-28 DIAGNOSIS — Z95810 Presence of automatic (implantable) cardiac defibrillator: Secondary | ICD-10-CM | POA: Diagnosis not present
